# Patient Record
Sex: Female | Born: 1950 | Race: White | Hispanic: No | Marital: Married | Smoking: Current every day smoker
Health system: Southern US, Community
[De-identification: ages and names within clinical notes are randomized; demographics above are authoritative.]

## PROBLEM LIST (undated history)

## (undated) ENCOUNTER — Emergency Department (HOSPITAL_BASED_OUTPATIENT_CLINIC_OR_DEPARTMENT_OTHER): Admission: EM | Payer: Medicare Other | Source: Home / Self Care

## (undated) DIAGNOSIS — M069 Rheumatoid arthritis, unspecified: Secondary | ICD-10-CM

## (undated) DIAGNOSIS — M542 Cervicalgia: Secondary | ICD-10-CM

## (undated) DIAGNOSIS — M1990 Unspecified osteoarthritis, unspecified site: Secondary | ICD-10-CM

## (undated) DIAGNOSIS — M51369 Other intervertebral disc degeneration, lumbar region without mention of lumbar back pain or lower extremity pain: Secondary | ICD-10-CM

## (undated) DIAGNOSIS — J449 Chronic obstructive pulmonary disease, unspecified: Secondary | ICD-10-CM

## (undated) DIAGNOSIS — E785 Hyperlipidemia, unspecified: Secondary | ICD-10-CM

## (undated) DIAGNOSIS — G8929 Other chronic pain: Secondary | ICD-10-CM

## (undated) DIAGNOSIS — E079 Disorder of thyroid, unspecified: Secondary | ICD-10-CM

## (undated) DIAGNOSIS — I2510 Atherosclerotic heart disease of native coronary artery without angina pectoris: Secondary | ICD-10-CM

## (undated) DIAGNOSIS — F1110 Opioid abuse, uncomplicated: Secondary | ICD-10-CM

## (undated) DIAGNOSIS — M5136 Other intervertebral disc degeneration, lumbar region: Secondary | ICD-10-CM

## (undated) DIAGNOSIS — K760 Fatty (change of) liver, not elsewhere classified: Secondary | ICD-10-CM

## (undated) HISTORY — DX: Other chronic pain: G89.29

## (undated) HISTORY — DX: Atherosclerotic heart disease of native coronary artery without angina pectoris: I25.10

## (undated) HISTORY — DX: Other intervertebral disc degeneration, lumbar region without mention of lumbar back pain or lower extremity pain: M51.369

## (undated) HISTORY — DX: Cervicalgia: M54.2

## (undated) HISTORY — DX: Disorder of thyroid, unspecified: E07.9

## (undated) HISTORY — DX: Hyperlipidemia, unspecified: E78.5

## (undated) HISTORY — DX: Opioid abuse, uncomplicated: F11.10

## (undated) HISTORY — DX: Chronic obstructive pulmonary disease, unspecified: J44.9

## (undated) HISTORY — DX: Rheumatoid arthritis, unspecified: M06.9

## (undated) HISTORY — DX: Unspecified osteoarthritis, unspecified site: M19.90

## (undated) HISTORY — PX: BREAST BIOPSY: SHX20

## (undated) HISTORY — PX: BREAST SURGERY: SHX581

## (undated) HISTORY — DX: Fatty (change of) liver, not elsewhere classified: K76.0

## (undated) HISTORY — PX: CHOLECYSTECTOMY: SHX55

## (undated) HISTORY — PX: BREAST EXCISIONAL BIOPSY: SUR124

## (undated) HISTORY — DX: Other intervertebral disc degeneration, lumbar region: M51.36

---

## 2007-03-17 ENCOUNTER — Encounter: Payer: Self-pay | Admitting: Family Medicine

## 2007-03-17 LAB — CONVERTED CEMR LAB
ALT: 278 units/L
AST: 179 units/L
Alkaline Phosphatase: 185 units/L
BUN: 5 mg/dL
CO2: 29 meq/L
Chloride: 104 meq/L
Cholesterol: 185 mg/dL
Creatinine, Ser: 0.7 mg/dL
Glucose, Bld: 142 mg/dL
HCT: 43.2 %
HDL: 55 mg/dL
Hemoglobin: 14.3 g/dL
Hgb A1c MFr Bld: 6 %
LDL Cholesterol: 87 mg/dL
Platelets: 262 10*3/uL
Potassium: 4.4 meq/L
Sodium: 143 meq/L
Total Bilirubin: 0.3 mg/dL
Triglycerides: 232 mg/dL
WBC, blood: 8.2 10*3/uL

## 2007-06-05 ENCOUNTER — Ambulatory Visit: Payer: Self-pay | Admitting: Family Medicine

## 2007-06-05 DIAGNOSIS — G894 Chronic pain syndrome: Secondary | ICD-10-CM | POA: Insufficient documentation

## 2007-06-05 DIAGNOSIS — E119 Type 2 diabetes mellitus without complications: Secondary | ICD-10-CM | POA: Insufficient documentation

## 2007-06-05 LAB — CONVERTED CEMR LAB: Blood Glucose, Fasting: 98 mg/dL

## 2007-06-06 ENCOUNTER — Encounter: Payer: Self-pay | Admitting: Family Medicine

## 2007-06-08 ENCOUNTER — Ambulatory Visit: Payer: Self-pay | Admitting: Family Medicine

## 2007-06-12 ENCOUNTER — Telehealth: Payer: Self-pay | Admitting: Family Medicine

## 2007-06-13 ENCOUNTER — Encounter: Payer: Self-pay | Admitting: Family Medicine

## 2007-06-13 ENCOUNTER — Telehealth: Payer: Self-pay | Admitting: Family Medicine

## 2007-06-13 LAB — CONVERTED CEMR LAB
Basophils Absolute: 0 10*3/uL (ref 0.0–0.1)
Basophils Relative: 0 % (ref 0–1)
Lymphocytes Relative: 42 % (ref 12–46)
MCHC: 34.9 g/dL (ref 30.0–36.0)
Monocytes Absolute: 0.8 10*3/uL (ref 0.1–1.0)
Neutro Abs: 5 10*3/uL (ref 1.7–7.7)
Neutrophils Relative %: 50 % (ref 43–77)
Platelets: 281 10*3/uL (ref 150–400)
RDW: 12.2 % (ref 11.5–15.5)

## 2007-06-27 ENCOUNTER — Encounter: Payer: Self-pay | Admitting: Family Medicine

## 2007-06-28 LAB — CONVERTED CEMR LAB: TSH: 0.376 microintl units/mL (ref 0.350–5.50)

## 2007-07-05 ENCOUNTER — Encounter: Payer: Self-pay | Admitting: Family Medicine

## 2007-07-14 ENCOUNTER — Ambulatory Visit: Payer: Self-pay | Admitting: Family Medicine

## 2007-07-14 ENCOUNTER — Encounter: Admission: RE | Admit: 2007-07-14 | Discharge: 2007-07-14 | Payer: Self-pay | Admitting: Family Medicine

## 2007-07-17 ENCOUNTER — Telehealth: Payer: Self-pay | Admitting: Family Medicine

## 2007-07-18 ENCOUNTER — Encounter: Payer: Self-pay | Admitting: Family Medicine

## 2007-07-20 ENCOUNTER — Encounter: Payer: Self-pay | Admitting: Family Medicine

## 2007-08-01 ENCOUNTER — Encounter: Payer: Self-pay | Admitting: Family Medicine

## 2007-08-25 ENCOUNTER — Ambulatory Visit: Payer: Self-pay | Admitting: Family Medicine

## 2007-08-28 ENCOUNTER — Telehealth: Payer: Self-pay | Admitting: Family Medicine

## 2007-08-31 ENCOUNTER — Encounter: Payer: Self-pay | Admitting: Family Medicine

## 2007-09-19 ENCOUNTER — Telehealth: Payer: Self-pay | Admitting: Family Medicine

## 2007-09-19 ENCOUNTER — Encounter: Payer: Self-pay | Admitting: Family Medicine

## 2007-09-25 ENCOUNTER — Ambulatory Visit: Payer: Self-pay | Admitting: Family Medicine

## 2007-09-25 LAB — CONVERTED CEMR LAB

## 2007-09-28 ENCOUNTER — Encounter: Payer: Self-pay | Admitting: Family Medicine

## 2007-09-28 ENCOUNTER — Telehealth: Payer: Self-pay | Admitting: Family Medicine

## 2007-09-28 DIAGNOSIS — F330 Major depressive disorder, recurrent, mild: Secondary | ICD-10-CM | POA: Insufficient documentation

## 2007-09-28 DIAGNOSIS — R945 Abnormal results of liver function studies: Secondary | ICD-10-CM

## 2007-09-28 DIAGNOSIS — R7989 Other specified abnormal findings of blood chemistry: Secondary | ICD-10-CM | POA: Insufficient documentation

## 2007-09-28 DIAGNOSIS — E782 Mixed hyperlipidemia: Secondary | ICD-10-CM | POA: Insufficient documentation

## 2007-09-28 DIAGNOSIS — K7689 Other specified diseases of liver: Secondary | ICD-10-CM | POA: Insufficient documentation

## 2007-09-29 ENCOUNTER — Encounter: Payer: Self-pay | Admitting: Family Medicine

## 2007-09-29 ENCOUNTER — Telehealth: Payer: Self-pay | Admitting: Family Medicine

## 2007-10-06 ENCOUNTER — Telehealth: Payer: Self-pay | Admitting: Family Medicine

## 2007-10-17 ENCOUNTER — Encounter: Payer: Self-pay | Admitting: Family Medicine

## 2007-10-17 ENCOUNTER — Other Ambulatory Visit: Admission: RE | Admit: 2007-10-17 | Discharge: 2007-10-17 | Payer: Self-pay | Admitting: Family Medicine

## 2007-10-17 ENCOUNTER — Ambulatory Visit: Payer: Self-pay | Admitting: Family Medicine

## 2007-10-17 DIAGNOSIS — M810 Age-related osteoporosis without current pathological fracture: Secondary | ICD-10-CM | POA: Insufficient documentation

## 2007-10-18 LAB — CONVERTED CEMR LAB
ALT: 18 units/L (ref 0–35)
AST: 22 units/L (ref 0–37)

## 2007-10-25 ENCOUNTER — Ambulatory Visit (HOSPITAL_COMMUNITY): Admission: RE | Admit: 2007-10-25 | Discharge: 2007-10-25 | Payer: Self-pay | Admitting: Orthopedic Surgery

## 2007-10-26 ENCOUNTER — Telehealth: Payer: Self-pay | Admitting: Family Medicine

## 2007-11-06 ENCOUNTER — Encounter: Payer: Self-pay | Admitting: Family Medicine

## 2007-11-23 ENCOUNTER — Encounter: Admission: RE | Admit: 2007-11-23 | Discharge: 2007-11-23 | Payer: Self-pay | Admitting: Family Medicine

## 2007-11-23 ENCOUNTER — Encounter: Payer: Self-pay | Admitting: Family Medicine

## 2007-11-27 ENCOUNTER — Ambulatory Visit: Payer: Self-pay | Admitting: Family Medicine

## 2007-12-20 ENCOUNTER — Telehealth: Payer: Self-pay | Admitting: Family Medicine

## 2007-12-26 ENCOUNTER — Ambulatory Visit: Payer: Self-pay | Admitting: Family Medicine

## 2007-12-28 LAB — CONVERTED CEMR LAB
Hemoglobin: 13.7 g/dL (ref 12.0–15.0)
Lymphocytes Relative: 28 % (ref 12–46)
Lymphs Abs: 2.3 10*3/uL (ref 0.7–4.0)
Monocytes Absolute: 0.7 10*3/uL (ref 0.1–1.0)
Monocytes Relative: 8 % (ref 3–12)
Neutro Abs: 4.8 10*3/uL (ref 1.7–7.7)
Neutrophils Relative %: 60 % (ref 43–77)
RBC: 4.45 M/uL (ref 3.87–5.11)
WBC: 8 10*3/uL (ref 4.0–10.5)

## 2008-01-02 ENCOUNTER — Telehealth: Payer: Self-pay | Admitting: Family Medicine

## 2008-03-14 ENCOUNTER — Ambulatory Visit: Payer: Self-pay | Admitting: Family Medicine

## 2008-03-14 ENCOUNTER — Telehealth: Payer: Self-pay | Admitting: Family Medicine

## 2008-03-15 ENCOUNTER — Telehealth: Payer: Self-pay | Admitting: Family Medicine

## 2008-03-15 ENCOUNTER — Encounter: Payer: Self-pay | Admitting: Family Medicine

## 2008-03-18 ENCOUNTER — Encounter: Payer: Self-pay | Admitting: Family Medicine

## 2008-03-19 ENCOUNTER — Encounter: Payer: Self-pay | Admitting: Family Medicine

## 2008-03-19 LAB — CONVERTED CEMR LAB
Albumin: 4.7 g/dL (ref 3.5–5.2)
Alkaline Phosphatase: 61 units/L (ref 39–117)
CO2: 25 meq/L (ref 19–32)
Calcium: 9.2 mg/dL (ref 8.4–10.5)
Chloride: 104 meq/L (ref 96–112)
Cholesterol: 229 mg/dL — ABNORMAL HIGH (ref 0–200)
Glucose, Bld: 104 mg/dL — ABNORMAL HIGH (ref 70–99)
LDL Cholesterol: 119 mg/dL — ABNORMAL HIGH (ref 0–99)
Potassium: 4.7 meq/L (ref 3.5–5.3)
Sodium: 140 meq/L (ref 135–145)
Total Protein: 7.3 g/dL (ref 6.0–8.3)
Triglycerides: 242 mg/dL — ABNORMAL HIGH (ref <150)

## 2008-04-01 ENCOUNTER — Ambulatory Visit: Payer: Self-pay | Admitting: Family Medicine

## 2008-04-01 DIAGNOSIS — M719 Bursopathy, unspecified: Secondary | ICD-10-CM

## 2008-04-01 DIAGNOSIS — M67919 Unspecified disorder of synovium and tendon, unspecified shoulder: Secondary | ICD-10-CM | POA: Insufficient documentation

## 2008-04-08 ENCOUNTER — Telehealth: Payer: Self-pay | Admitting: Family Medicine

## 2008-04-09 ENCOUNTER — Encounter: Payer: Self-pay | Admitting: Family Medicine

## 2008-04-09 ENCOUNTER — Encounter: Admission: RE | Admit: 2008-04-09 | Discharge: 2008-04-09 | Payer: Self-pay | Admitting: Family Medicine

## 2008-04-19 ENCOUNTER — Encounter: Payer: Self-pay | Admitting: Family Medicine

## 2008-04-22 ENCOUNTER — Ambulatory Visit: Payer: Self-pay | Admitting: Family Medicine

## 2008-04-22 DIAGNOSIS — K649 Unspecified hemorrhoids: Secondary | ICD-10-CM | POA: Insufficient documentation

## 2008-05-06 ENCOUNTER — Telehealth: Payer: Self-pay | Admitting: Family Medicine

## 2008-05-13 ENCOUNTER — Encounter: Payer: Self-pay | Admitting: Family Medicine

## 2008-05-31 HISTORY — PX: TOTAL KNEE ARTHROPLASTY: SHX125

## 2008-06-26 ENCOUNTER — Telehealth: Payer: Self-pay | Admitting: Family Medicine

## 2008-07-03 ENCOUNTER — Ambulatory Visit: Payer: Self-pay | Admitting: Family Medicine

## 2008-07-09 ENCOUNTER — Telehealth: Payer: Self-pay | Admitting: Family Medicine

## 2008-07-13 ENCOUNTER — Encounter: Payer: Self-pay | Admitting: Family Medicine

## 2008-07-31 ENCOUNTER — Ambulatory Visit: Payer: Self-pay | Admitting: Family Medicine

## 2008-07-31 DIAGNOSIS — M12819 Other specific arthropathies, not elsewhere classified, unspecified shoulder: Secondary | ICD-10-CM | POA: Insufficient documentation

## 2008-07-31 DIAGNOSIS — M4802 Spinal stenosis, cervical region: Secondary | ICD-10-CM | POA: Insufficient documentation

## 2008-07-31 LAB — CONVERTED CEMR LAB: Hgb A1c MFr Bld: 5.7 %

## 2008-08-29 ENCOUNTER — Telehealth: Payer: Self-pay | Admitting: Family Medicine

## 2008-09-27 ENCOUNTER — Telehealth: Payer: Self-pay | Admitting: Family Medicine

## 2008-10-10 ENCOUNTER — Telehealth: Payer: Self-pay | Admitting: Family Medicine

## 2008-10-11 ENCOUNTER — Encounter: Payer: Self-pay | Admitting: Family Medicine

## 2008-10-11 LAB — CONVERTED CEMR LAB
BUN: 19 mg/dL (ref 6–23)
Creatinine, Ser: 1.3 mg/dL — ABNORMAL HIGH (ref 0.40–1.20)

## 2008-10-12 ENCOUNTER — Encounter: Admission: RE | Admit: 2008-10-12 | Discharge: 2008-10-12 | Payer: Self-pay | Admitting: Sports Medicine

## 2008-10-23 ENCOUNTER — Telehealth: Payer: Self-pay | Admitting: Family Medicine

## 2008-10-23 DIAGNOSIS — N189 Chronic kidney disease, unspecified: Secondary | ICD-10-CM | POA: Insufficient documentation

## 2008-10-31 ENCOUNTER — Encounter: Payer: Self-pay | Admitting: Family Medicine

## 2008-11-01 LAB — CONVERTED CEMR LAB: Creatinine, Ser: 0.83 mg/dL (ref 0.40–1.20)

## 2008-11-21 ENCOUNTER — Telehealth (INDEPENDENT_AMBULATORY_CARE_PROVIDER_SITE_OTHER): Payer: Self-pay | Admitting: *Deleted

## 2008-11-28 ENCOUNTER — Telehealth: Payer: Self-pay | Admitting: Family Medicine

## 2008-12-20 ENCOUNTER — Ambulatory Visit: Payer: Self-pay | Admitting: Family Medicine

## 2008-12-20 LAB — CONVERTED CEMR LAB: Albumin/Creatinine Ratio, Urine, POC: <30

## 2008-12-21 ENCOUNTER — Encounter: Payer: Self-pay | Admitting: Family Medicine

## 2008-12-24 LAB — CONVERTED CEMR LAB
Albumin: 4.4 g/dL (ref 3.5–5.2)
Basophils Relative: 1 % (ref 0–1)
CO2: 22 meq/L (ref 19–32)
Glucose, Bld: 131 mg/dL — ABNORMAL HIGH (ref 70–99)
Hemoglobin: 15.1 g/dL — ABNORMAL HIGH (ref 12.0–15.0)
Lymphocytes Relative: 49 % — ABNORMAL HIGH (ref 12–46)
MCHC: 33.9 g/dL (ref 30.0–36.0)
Monocytes Absolute: 0.6 10*3/uL (ref 0.1–1.0)
Monocytes Relative: 7 % (ref 3–12)
Neutro Abs: 3.4 10*3/uL (ref 1.7–7.7)
Potassium: 4.5 meq/L (ref 3.5–5.3)
RBC: 4.52 M/uL (ref 3.87–5.11)
Sodium: 138 meq/L (ref 135–145)
TSH: 0.278 microintl units/mL — ABNORMAL LOW (ref 0.350–4.500)
Tissue Transglutaminase Ab, IgA: 0.5 units (ref <7)
Total Protein: 7 g/dL (ref 6.0–8.3)

## 2008-12-30 ENCOUNTER — Telehealth: Payer: Self-pay | Admitting: Family Medicine

## 2008-12-30 ENCOUNTER — Ambulatory Visit: Payer: Self-pay | Admitting: Family Medicine

## 2009-01-06 ENCOUNTER — Telehealth: Payer: Self-pay | Admitting: Family Medicine

## 2009-01-07 ENCOUNTER — Encounter: Admission: RE | Admit: 2009-01-07 | Discharge: 2009-01-07 | Payer: Self-pay | Admitting: Family Medicine

## 2009-01-09 ENCOUNTER — Encounter: Payer: Self-pay | Admitting: Family Medicine

## 2009-02-06 ENCOUNTER — Ambulatory Visit: Payer: Self-pay | Admitting: Family Medicine

## 2009-02-12 ENCOUNTER — Inpatient Hospital Stay (HOSPITAL_COMMUNITY): Admission: RE | Admit: 2009-02-12 | Discharge: 2009-02-15 | Payer: Self-pay | Admitting: Orthopedic Surgery

## 2009-02-27 ENCOUNTER — Telehealth: Payer: Self-pay | Admitting: Family Medicine

## 2009-03-05 ENCOUNTER — Telehealth: Payer: Self-pay | Admitting: Family Medicine

## 2009-03-07 ENCOUNTER — Encounter: Payer: Self-pay | Admitting: Family Medicine

## 2009-03-07 LAB — CONVERTED CEMR LAB: Prothrombin Time: 17.6 s

## 2009-03-10 LAB — CONVERTED CEMR LAB: TSH: 12.905 microintl units/mL — ABNORMAL HIGH (ref 0.350–4.500)

## 2009-04-21 ENCOUNTER — Encounter: Admission: RE | Admit: 2009-04-21 | Discharge: 2009-04-21 | Payer: Self-pay | Admitting: Family Medicine

## 2009-04-21 ENCOUNTER — Ambulatory Visit: Payer: Self-pay | Admitting: Family Medicine

## 2009-05-01 ENCOUNTER — Telehealth: Payer: Self-pay | Admitting: Family Medicine

## 2009-05-14 ENCOUNTER — Telehealth (INDEPENDENT_AMBULATORY_CARE_PROVIDER_SITE_OTHER): Payer: Self-pay | Admitting: *Deleted

## 2009-05-21 ENCOUNTER — Encounter: Payer: Self-pay | Admitting: Family Medicine

## 2009-05-28 ENCOUNTER — Telehealth: Payer: Self-pay | Admitting: Family Medicine

## 2009-06-23 ENCOUNTER — Encounter: Payer: Self-pay | Admitting: Family Medicine

## 2009-07-03 ENCOUNTER — Ambulatory Visit: Payer: Self-pay | Admitting: Family Medicine

## 2009-07-07 ENCOUNTER — Telehealth: Payer: Self-pay | Admitting: Family Medicine

## 2009-07-07 LAB — CONVERTED CEMR LAB
ALT: 15 units/L (ref 0–35)
Albumin: 4.5 g/dL (ref 3.5–5.2)
Alkaline Phosphatase: 75 units/L (ref 39–117)
CO2: 24 meq/L (ref 19–32)
Glucose, Bld: 94 mg/dL (ref 70–99)
LDL Cholesterol: 170 mg/dL — ABNORMAL HIGH (ref 0–99)
MCHC: 32.7 g/dL (ref 30.0–36.0)
Potassium: 4.6 meq/L (ref 3.5–5.3)
RBC: 4.96 M/uL (ref 3.87–5.11)
Sodium: 140 meq/L (ref 135–145)
Total Protein: 7.7 g/dL (ref 6.0–8.3)
Triglycerides: 224 mg/dL — ABNORMAL HIGH (ref <150)
WBC: 12.6 10*3/uL — ABNORMAL HIGH (ref 4.0–10.5)

## 2009-07-14 ENCOUNTER — Telehealth: Payer: Self-pay | Admitting: Family Medicine

## 2009-08-05 ENCOUNTER — Ambulatory Visit: Payer: Self-pay | Admitting: Family Medicine

## 2009-08-21 ENCOUNTER — Telehealth (INDEPENDENT_AMBULATORY_CARE_PROVIDER_SITE_OTHER): Payer: Self-pay | Admitting: *Deleted

## 2009-09-10 ENCOUNTER — Ambulatory Visit: Payer: Self-pay | Admitting: Family Medicine

## 2009-10-01 ENCOUNTER — Telehealth: Payer: Self-pay | Admitting: Family Medicine

## 2009-10-15 ENCOUNTER — Encounter: Payer: Self-pay | Admitting: Family Medicine

## 2009-10-23 ENCOUNTER — Ambulatory Visit: Payer: Self-pay | Admitting: Family Medicine

## 2009-10-29 ENCOUNTER — Encounter: Admission: RE | Admit: 2009-10-29 | Discharge: 2009-10-29 | Payer: Self-pay | Admitting: Family Medicine

## 2009-10-29 ENCOUNTER — Ambulatory Visit: Payer: Self-pay | Admitting: Family Medicine

## 2009-11-13 ENCOUNTER — Telehealth: Payer: Self-pay | Admitting: Family Medicine

## 2009-11-13 ENCOUNTER — Encounter: Payer: Self-pay | Admitting: Family Medicine

## 2009-11-17 ENCOUNTER — Telehealth: Payer: Self-pay | Admitting: Family Medicine

## 2009-11-26 ENCOUNTER — Telehealth (INDEPENDENT_AMBULATORY_CARE_PROVIDER_SITE_OTHER): Payer: Self-pay | Admitting: *Deleted

## 2009-12-04 ENCOUNTER — Ambulatory Visit: Payer: Self-pay | Admitting: Family Medicine

## 2009-12-04 LAB — CONVERTED CEMR LAB: Hgb A1c MFr Bld: 6.1 %

## 2009-12-05 ENCOUNTER — Telehealth: Payer: Self-pay | Admitting: Family Medicine

## 2009-12-05 LAB — CONVERTED CEMR LAB
AST: 129 units/L — ABNORMAL HIGH (ref 0–37)
Bilirubin, Direct: 0.1 mg/dL (ref 0.0–0.3)
Direct LDL: 107 mg/dL — ABNORMAL HIGH
GGT: 64 units/L — ABNORMAL HIGH (ref 7–51)
TSH: 0.608 microintl units/mL (ref 0.350–4.500)
Total Bilirubin: 0.3 mg/dL (ref 0.3–1.2)

## 2009-12-09 ENCOUNTER — Telehealth (INDEPENDENT_AMBULATORY_CARE_PROVIDER_SITE_OTHER): Payer: Self-pay | Admitting: *Deleted

## 2009-12-24 ENCOUNTER — Ambulatory Visit: Payer: Self-pay | Admitting: Family Medicine

## 2009-12-24 DIAGNOSIS — K700 Alcoholic fatty liver: Secondary | ICD-10-CM | POA: Insufficient documentation

## 2009-12-24 DIAGNOSIS — T7840XA Allergy, unspecified, initial encounter: Secondary | ICD-10-CM | POA: Insufficient documentation

## 2009-12-30 ENCOUNTER — Telehealth: Payer: Self-pay | Admitting: Family Medicine

## 2010-01-09 ENCOUNTER — Telehealth: Payer: Self-pay | Admitting: Family Medicine

## 2010-01-19 ENCOUNTER — Ambulatory Visit: Payer: Self-pay | Admitting: Family Medicine

## 2010-01-21 ENCOUNTER — Encounter: Payer: Self-pay | Admitting: Family Medicine

## 2010-01-21 ENCOUNTER — Telehealth (INDEPENDENT_AMBULATORY_CARE_PROVIDER_SITE_OTHER): Payer: Self-pay | Admitting: *Deleted

## 2010-01-21 LAB — CONVERTED CEMR LAB
ALT: 14 units/L (ref 0–35)
Alkaline Phosphatase: 82 units/L (ref 39–117)
Bilirubin, Direct: 0.1 mg/dL (ref 0.0–0.3)
CO2: 26 meq/L (ref 19–32)
Chloride: 101 meq/L (ref 96–112)
INR: 0.92 (ref <1.50)
Indirect Bilirubin: 0.2 mg/dL (ref 0.0–0.9)
Lymphocytes Relative: 34 % (ref 12–46)
Lymphs Abs: 3.4 10*3/uL (ref 0.7–4.0)
MCV: 96.1 fL (ref 78.0–100.0)
Monocytes Relative: 8 % (ref 3–12)
Neutro Abs: 5.6 10*3/uL (ref 1.7–7.7)
Neutrophils Relative %: 55 % (ref 43–77)
Potassium: 4.4 meq/L (ref 3.5–5.3)
Prothrombin Time: 12.6 s (ref 11.6–15.2)
RBC: 4.1 M/uL (ref 3.87–5.11)
Sodium: 138 meq/L (ref 135–145)
Total Protein: 7.2 g/dL (ref 6.0–8.3)
WBC: 10.1 10*3/uL (ref 4.0–10.5)

## 2010-01-22 ENCOUNTER — Telehealth: Payer: Self-pay | Admitting: Family Medicine

## 2010-01-29 ENCOUNTER — Telehealth: Payer: Self-pay | Admitting: Family Medicine

## 2010-02-10 ENCOUNTER — Telehealth (INDEPENDENT_AMBULATORY_CARE_PROVIDER_SITE_OTHER): Payer: Self-pay | Admitting: *Deleted

## 2010-03-18 ENCOUNTER — Telehealth: Payer: Self-pay | Admitting: Family Medicine

## 2010-04-04 ENCOUNTER — Encounter: Admission: RE | Admit: 2010-04-04 | Discharge: 2010-04-04 | Payer: Self-pay | Admitting: Orthopedic Surgery

## 2010-04-10 ENCOUNTER — Ambulatory Visit: Payer: Self-pay | Admitting: Family Medicine

## 2010-04-10 DIAGNOSIS — J0100 Acute maxillary sinusitis, unspecified: Secondary | ICD-10-CM | POA: Insufficient documentation

## 2010-05-08 ENCOUNTER — Ambulatory Visit: Payer: Self-pay | Admitting: Family Medicine

## 2010-05-13 ENCOUNTER — Ambulatory Visit: Payer: Self-pay | Admitting: Family Medicine

## 2010-05-13 DIAGNOSIS — R030 Elevated blood-pressure reading, without diagnosis of hypertension: Secondary | ICD-10-CM | POA: Insufficient documentation

## 2010-05-18 ENCOUNTER — Encounter
Admission: RE | Admit: 2010-05-18 | Discharge: 2010-05-18 | Payer: Self-pay | Source: Home / Self Care | Attending: Orthopedic Surgery | Admitting: Orthopedic Surgery

## 2010-05-19 ENCOUNTER — Telehealth (INDEPENDENT_AMBULATORY_CARE_PROVIDER_SITE_OTHER): Payer: Self-pay | Admitting: *Deleted

## 2010-05-27 ENCOUNTER — Encounter
Admission: RE | Admit: 2010-05-27 | Discharge: 2010-05-27 | Payer: Self-pay | Source: Home / Self Care | Attending: Family Medicine | Admitting: Family Medicine

## 2010-05-31 HISTORY — PX: LUMBAR LAMINECTOMY: SHX95

## 2010-06-02 ENCOUNTER — Telehealth: Payer: Self-pay | Admitting: Family Medicine

## 2010-06-08 ENCOUNTER — Telehealth: Payer: Self-pay | Admitting: Family Medicine

## 2010-06-10 ENCOUNTER — Ambulatory Visit
Admission: RE | Admit: 2010-06-10 | Discharge: 2010-06-10 | Payer: Self-pay | Source: Home / Self Care | Attending: Family Medicine | Admitting: Family Medicine

## 2010-06-17 ENCOUNTER — Inpatient Hospital Stay (HOSPITAL_COMMUNITY)
Admission: AD | Admit: 2010-06-17 | Discharge: 2010-06-19 | Payer: Self-pay | Source: Home / Self Care | Attending: Orthopedic Surgery | Admitting: Orthopedic Surgery

## 2010-06-17 ENCOUNTER — Encounter (INDEPENDENT_AMBULATORY_CARE_PROVIDER_SITE_OTHER): Payer: Self-pay | Admitting: Orthopedic Surgery

## 2010-06-17 LAB — APTT: aPTT: 35 seconds (ref 24–37)

## 2010-06-17 LAB — SURGICAL PCR SCREEN
MRSA, PCR: NEGATIVE
Staphylococcus aureus: NEGATIVE

## 2010-06-17 LAB — URINALYSIS, ROUTINE W REFLEX MICROSCOPIC
Bilirubin Urine: NEGATIVE
Hgb urine dipstick: NEGATIVE
Ketones, ur: NEGATIVE mg/dL
Nitrite: NEGATIVE
Protein, ur: NEGATIVE mg/dL
Specific Gravity, Urine: 1.014 (ref 1.005–1.030)
Urine Glucose, Fasting: NEGATIVE mg/dL
Urobilinogen, UA: 0.2 mg/dL (ref 0.0–1.0)
pH: 7 (ref 5.0–8.0)

## 2010-06-17 LAB — PROTIME-INR
INR: 0.96 (ref 0.00–1.49)
Prothrombin Time: 13 seconds (ref 11.6–15.2)

## 2010-06-17 LAB — COMPREHENSIVE METABOLIC PANEL
ALT: 19 U/L (ref 0–35)
AST: 22 U/L (ref 0–37)
Albumin: 4 g/dL (ref 3.5–5.2)
Alkaline Phosphatase: 73 U/L (ref 39–117)
BUN: 9 mg/dL (ref 6–23)
CO2: 30 mEq/L (ref 19–32)
Calcium: 9.5 mg/dL (ref 8.4–10.5)
Chloride: 103 mEq/L (ref 96–112)
Creatinine, Ser: 0.8 mg/dL (ref 0.4–1.2)
GFR calc Af Amer: >60 mL/min (ref >60)
GFR calc non Af Amer: >60 mL/min (ref >60)
Glucose, Bld: 121 mg/dL — ABNORMAL HIGH (ref 70–99)
Potassium: 4.2 mEq/L (ref 3.5–5.1)
Sodium: 140 mEq/L (ref 135–145)
Total Bilirubin: 0.5 mg/dL (ref 0.3–1.2)
Total Protein: 7.5 g/dL (ref 6.0–8.3)

## 2010-06-17 LAB — DIFFERENTIAL
Basophils Absolute: 0.1 10*3/uL (ref 0.0–0.1)
Basophils Relative: 1 % (ref 0–1)
Eosinophils Absolute: 0.4 10*3/uL (ref 0.0–0.7)
Eosinophils Relative: 3 % (ref 0–5)
Lymphocytes Relative: 30 % (ref 12–46)
Lymphs Abs: 3.3 10*3/uL (ref 0.7–4.0)
Monocytes Absolute: 0.9 10*3/uL (ref 0.1–1.0)
Monocytes Relative: 8 % (ref 3–12)
Neutro Abs: 6.5 10*3/uL (ref 1.7–7.7)
Neutrophils Relative %: 59 % (ref 43–77)

## 2010-06-17 LAB — TYPE AND SCREEN
ABO/RH(D): O NEG
Antibody Screen: NEGATIVE

## 2010-06-17 LAB — CBC
HCT: 39.7 % (ref 36.0–46.0)
Hemoglobin: 13.3 g/dL (ref 12.0–15.0)
MCH: 30.3 pg (ref 26.0–34.0)
MCHC: 33.5 g/dL (ref 30.0–36.0)
MCV: 90.4 fL (ref 78.0–100.0)
Platelets: 298 10*3/uL (ref 150–400)
RBC: 4.39 MIL/uL (ref 3.87–5.11)
RDW: 12.4 % (ref 11.5–15.5)
WBC: 11 10*3/uL — ABNORMAL HIGH (ref 4.0–10.5)

## 2010-06-22 ENCOUNTER — Encounter: Payer: Self-pay | Admitting: Orthopedic Surgery

## 2010-06-22 LAB — HEMOGLOBIN AND HEMATOCRIT, BLOOD
HCT: 36.7 % (ref 36.0–46.0)
HCT: 32.4 % — ABNORMAL LOW (ref 36.0–46.0)
Hemoglobin: 11.9 g/dL — ABNORMAL LOW (ref 12.0–15.0)
Hemoglobin: 10.8 g/dL — ABNORMAL LOW (ref 12.0–15.0)

## 2010-06-22 LAB — GLUCOSE, CAPILLARY
Glucose-Capillary: 108 mg/dL — ABNORMAL HIGH (ref 70–99)
Glucose-Capillary: 122 mg/dL — ABNORMAL HIGH (ref 70–99)
Glucose-Capillary: 139 mg/dL — ABNORMAL HIGH (ref 70–99)
Glucose-Capillary: 120 mg/dL — ABNORMAL HIGH (ref 70–99)
Glucose-Capillary: 108 mg/dL — ABNORMAL HIGH (ref 70–99)

## 2010-06-22 NOTE — Op Note (Signed)
NAMEILEE, RANDLEMAN                 ACCOUNT NO.:  [id number]  MEDICAL RECORD NO.:  [id number]          PATIENT TYPE:  AMB  LOCATION:  DAY                          FACILITY:  [facility]  PHYSICIAN:  Georges Lynch. Demecia Northway, M.D.DATE OF BIRTH:  [birth year]  DATE OF PROCEDURE:  06/17/2010 DATE OF DISCHARGE:                              OPERATIVE REPORT   SURGEON:  Georges Lynch. Darrelyn Hillock, M.D.  ASSISTANT:  Marlowe Kays, M.D.  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  The myelogram reported that the stenotic area was quite likely compressing the L3 root.  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  OPERATIONS: 1. Complete decompressive lumbar laminectomy L3-L4 for stenosis. 2. Microdiskectomy at L3-L4 on the left for herniated disk and recess     stenosis.  We did foraminotomies for the 3 and 4 roots as well.  PROCEDURE:  Under general anesthesia, routine orthopedic prep and drape in the lower back carried out.  She was given 1 gram of IV Ancef.  At this time, the appropriate time-out was carried out in the operating room.  Prior to that, I marked the appropriate side of her back, which was the left side.  After sterile prep and drape, these are carried out on the spinal frame.  Two needles are placed in the back for localization purposes and an x-ray was taken.  At this particular time, an incision is made over the L3-L4, L4-L5 space.  Bleeders identified and cauterized.  She had quite a bit of oozing, slow oozing of blood during the procedure.  We were able to maintain hemostasis.  The muscle was stripped from the lamina and spinous processes bilaterally at L3-L4, two Kocher clamps are placed on the spinous processes.  Another x-ray was taken to identify the L3 spinous process.  We then carried out a complete decompressive lumbar laminectomy at the L3-L4 level.  The microscope  was brought in.  Great care was taken to protect the underlying dura.  He had a marked recess stenosis on the left at L3-L4. We went up above and did a decompression for the foramina of the root above and below.  We decompressed L3 and L4.  We were able to easily pass a hockey-stick out the foramina when we were done.  We then went out far laterally because of the tightness of the canal, cauterized lateral recess veins, identified the disk and needle was placed in the disk space.  The disk space was quite narrow.  We then protected the dura with a D'Errico retractor and then made a cruciate incision in the posterior longitudinal ligament and did a diskectomy.  Note, the microscope was used.  After this was done, the root was nicely decompressed as well as the dura.  We were able to easily pass a hockey- stick out the foramina of the 4 root and the 3 root.  Thoroughly irrigated out the area.  We then injected 10 mL of Surgiflo around the operative site for hemostasis and then lightly applied some  thrombin- soaked Gelfoam, not over the dura, but around the surrounding soft tissue and bony structures.  I then closed the wound in layers usual fashion, except I left a small deep distal and proximal part of the wound open for drainage purposes.  Subcutaneous closed with Vicryl, skin is closed with metal staples.  A sterile Neosporin dressing is applied.  Patient left the operating room in satisfactory condition.  ESTIMATED BLOOD LOSS:  About 150 mL.  She did have 1 g of IV Ancef preop.          ______________________________ Georges Lynch. Darrelyn Hillock, M.D.     RAG/MEDQ  D:  06/17/2010  T:  06/17/2010  Job:  [id number]  cc:   Windy Fast A. Darrelyn Hillock, M.D. Fax: [phone]  Electronically Signed by Ranee Gosselin M.D. on 06/22/2010 11:51:37 AM

## 2010-06-23 ENCOUNTER — Telehealth: Payer: Self-pay | Admitting: Family Medicine

## 2010-06-30 NOTE — Progress Notes (Signed)
Summary: ortho ?s  Phone Note Call from Patient   Caller: Patient Summary of Call: Pt wants to increase dose of omeprazole and would like to know if she can start prednisone for knee pain in hopes to get off pain meds. Please advise.  Initial call taken by: Payton Spark CMA,  July 07, 2009 4:06 PM  Follow-up for Phone Call        advise asking orthopedist about prednisone.  long term prednisone for a chronic condition like osteoarthrits can have negative consequenses.  Follow-up by: Seymour Bars DO,  July 07, 2009 4:25 PM    New/Updated Medications: OMEPRAZOLE 40 MG CPDR (OMEPRAZOLE) 1 tab by mouth daily, 30 min before breakfast Prescriptions: OMEPRAZOLE 40 MG CPDR (OMEPRAZOLE) 1 tab by mouth daily, 30 min before breakfast  #30 x 3   Entered and Authorized by:   Seymour Bars DO   Signed by:   Seymour Bars DO on 07/07/2009   Method used:   Electronically to        [facility] [phone]* (retail)       [address]       [place], [place]  [address]       Ph: [phone]       Fax: [phone]   RxID:   [id number]   Appended Document: ortho ?s LMOM informing Pt of the above as she requested  Appended Document: ortho ?s Noted above comment.  I have recently started [name] on an antidepressant.  Seymour Bars, D.O.

## 2010-06-30 NOTE — Progress Notes (Signed)
Summary: Reclast  Phone Note Call from Patient   Caller: Patient Summary of Call: Pt would like to have Reclast done. She would like to have this scheduled at [facility]. Please advise. Initial call taken by: Payton Spark CMA,  November 13, 2009 9:57 AM  Follow-up for Phone Call        Marcelino Duster, pls call [facility] and find out what she needs to have this done - a CMP within 8 wks?? Follow-up by: Seymour Bars DO,  November 17, 2009 8:04 AM  Additional Follow-up for Phone Call Additional follow up Details #1::        Spoke with dept at [facility] who does reclast and they said there is no time frame as long as been done within a year Additional Follow-up by: Kathlene November,  November 17, 2009 1:36 PM  New Problems: HYPERCALCEMIA (ICD-275.42)   Additional Follow-up for Phone Call Additional follow up Details #2::    needs repeat Calcium with PTH first. Then will set up Reclast IV infusion at short stay at [facility].  She is to hydrate well the day prior to Reclast infusion and take Tylenol the night before her infusion.   Follow-up by: Seymour Bars DO,  November 17, 2009 1:50 PM  New Problems: HYPERCALCEMIA (ICD-275.42) New/Updated Medications: RECLAST 5 MG/100ML SOLN (ZOLEDRONIC ACID) IV infusion over 15 minutes Prescriptions: RECLAST 5 MG/100ML SOLN (ZOLEDRONIC ACID) IV infusion over 15 minutes  #1 x 0   Entered and Authorized by:   Seymour Bars DO   Signed by:   Seymour Bars DO on 11/17/2009   Method used:   Print then Give to Patient   RxID:   [id number]   Appended Document: Reclast 11/17/2009 @ 1:52pm-LMOM for pt to call back and let know when wants to do lab work and I would fax the order to [employer]. KJ LPN

## 2010-06-30 NOTE — Progress Notes (Signed)
Summary: Flonase  Phone Note Call from Patient Call back at Home Phone [phone]   Caller: Patient Call For: Seymour Bars DO Summary of Call: Pt called and had sinus infection and using Claritin D and saline solution but would like to know if you would call in the Flonase for her to use Initial call taken by: Kathlene November,  November 17, 2009 9:51 AM    Prescriptions: Aleda Grana 50 MCG/ACT SUSP (FLUTICASONE PROPIONATE) 2 sprays/ nostril daily  #1 bottle x 2   Entered and Authorized by:   Seymour Bars DO   Signed by:   Seymour Bars DO on 11/17/2009   Method used:   Electronically to        [facility] [phone]* (retail)       [address]       [place], [place]  [address]       Ph: [phone]       Fax: [phone]   RxID:   [id number]

## 2010-06-30 NOTE — Assessment & Plan Note (Signed)
Summary: allergic reaction   Vital Signs:  Patient profile:   60 year old female Height:      64.5 inches Weight:      129 pounds BMI:     21.88 O2 Sat:      93 % on Room air Pulse rate:   90 / minute BP sitting:   124 / 76  (left arm) Cuff size:   regular  Vitals Entered By: Payton Spark CMA (December 24, 2009 11:41 AM)  O2 Flow:  Room air CC: Eyes red, swollen and itchy. Ichy rash on arms and legs x 5-6 days.  Vision Screening:Left eye w/o correction: 20 / 70 Right Eye w/o correction: 20 / 50 Both eyes w/o correction:  20/ 50        Vision Entered By: Payton Spark CMA (December 24, 2009 11:42 AM)   Primary Care Provider:  Seymour Bars DO  CC:  Eyes red and swollen and itchy. Ichy rash on arms and legs x 5-6 days.[name]  History of Present Illness: 60 yo WF presents for a rash on her arms, trunk, face and legs x 4 days.  It is a little itchy.  Her eyes have been itchy but no watering or discharge.  Denies any blurred vision but she is supposed to be wearing RX glasses which she broke.    She would like to quit drinking and quit smoking.  She is checking into getting a discount on Chantix and is aware that there is a black box warning re: SCD.  She drinks 3 beers/ day and says that she 'cannot quit'.  She used to drink more but notes that she does not drink to 'get drunk' and rarely binges any more.  She does have a distant hx of DUI.  She knows that it is bad to drink ETOH with elevated LFTs.  she is not quite ready for AA.  Allergies: 1)  ! Codeine 2)  ! * Ultracet  Past History:  Past Medical History: Reviewed history from 07/03/2009 and no changes required. 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 GB breast lump, benign knee R and L hand 2007 R knee TKR  2010  Family History: Reviewed history from 07/31/2008 and no changes required. Mother and father AMI in 19's mother: high chol, died of NHL father died, DM GM Hodgkin's Lympoma sister:  primary biliary cirrhosis father, mother both had RA  Social History: Reviewed history from 06/05/2007 and no changes required. 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 Has 2 sons - 12 & 30. Smokes 1 ppd x 43 yrs. 5 ETOH/ wk. Denies drug use. does not work out  Review of Systems General:  Complains of fatigue; denies fever, weakness, and weight loss. Eyes:  Complains of itching; denies blurring, discharge, eye irritation, and red eye. ENT:  Denies difficulty swallowing and sore throat. Resp:  Denies cough, shortness of breath, and wheezing.  Physical Exam  General:  alert, well-developed, well-nourished, and well-hydrated.   Head:  normocephalic and atraumatic.   Eyes:  R>L upper eyelid edema with slight injection.  Sclera w/o injection.  No watering or mucous discharge Nose:  no nasal discharge.   Mouth:  pharynx pink and moist.   Neck:  no masses.   Lungs:  Normal respiratory effort, chest expands symmetrically. Lungs  are clear to auscultation, no crackles or wheezes. Heart:  Normal rate and regular rhythm. S1 and S2 normal without gallop, murmur, click, rub or other extra sounds. Msk:  no joint swelling, no joint warmth, and no redness over joints.   Extremities:  no LE edema Skin:  maculopapular rash on both arms, chest, face, legs. Cervical Nodes:  No lymphadenopathy noted Psych:  good eye contact, not anxious appearing, and not depressed appearing.     Impression & Recommendations:  Problem # 1:  ALLERGIC REACTION (ICD-995.3)  Allergic dermatitis with eyelid edema and itchy eyes.   Unsure of what the etiology is.  Treat with Solumedrol 125 MG IM x 1 followed by 7 days of Prednisone 40 mg/ day. Use Benadyl at night as needed for itching.  Call if not improved by Monday.     Orders: Solumedrol up to 125mg  (W4132) Admin of Therapeutic Inj  intramuscular or subcutaneous (44010)  Problem # 2:  ALCOHOLIC FATTY LIVER (ICD-571.0) Counseled on ETOH dependence.  She is in the contemplative phase of change.  She does not seem a direct threat to herself or others but does realize that continued smoking and ETOh are both detrimental to her health.  Time spent face to face: 20 min.  Complete Medication List: 1)  Alprazolam 1 Mg Tabs (Alprazolam) .[name].. 1 tab by mouth two times a day as needed anxiety 2)  Gabapentin 600 Mg Tabs (Gabapentin) .[name].. 1 tab by mouth tid 3)  Metformin Hcl 850 Mg Tabs (Metformin hcl) .[name].. 1 tab by mouth bid 4)  Levothyroxine Sodium 125 Mcg Tabs (Levothyroxine sodium) .[name].. 1 tab by mouth daily 5)  Ventolin Hfa 108 (90 Base) Mcg/act Aers (Albuterol sulfate) .... 2 puffs q 4 hrs prn 6)  Oxycodone Hcl 10 Mg Tabs (Oxycodone hcl) .[name].. 1 tab by mouth q 6 hrs as needed severe pain 7)  Flonase 50 Mcg/act Susp (Fluticasone propionate) .... 2 sprays/ nostril daily 8)  Effexor Xr 150 Mg Xr24h-cap (Venlafaxine hcl) .[name].. 1 capsule by mouth once a day 9)  Omeprazole 40 Mg Cpdr (Omeprazole) .[name].. 1 tab by mouth daily, 30 min before breakfast 10)  Duoneb 0.5-2.5 (3) Mg/105ml Soln (Ipratropium-albuterol) .... 3 ml neb 4 x a day 11)  Symbicort 160-4.5 Mcg/act Aero (Budesonide-formoterol fumarate) .[name].. 1 puff bid 12)  Reclast 5 Mg/112ml Soln (Zoledronic acid) .... Iv infusion over 15 minutes 13)  Prednisone 20 Mg Tabs (Prednisone) .... 2 tabs by mouth qam x 7 days  Patient Instructions: 1)  Steroid shot today. 2)  Start on oral prednisone tomorrow - 2 tabs each AM x 7 days for allergic reaction. 3)  Be sure to get glasses for vision -- you failed your test today. 4)  Call if not improved in 1 wk. Prescriptions: PREDNISONE 20 MG TABS (PREDNISONE) 2 tabs by mouth qAM x 7 days  #14 x 0   Entered and Authorized by:   Seymour Bars DO   Signed by:   Seymour Bars DO on  12/24/2009   Method used:   Electronically to        [facility] [phone]* (retail)       [address] [place], [place]  [address]       Ph: [phone] or [phone]       Fax: [phone]   RxID:   [id number] PREDNISONE 20 MG TABS (PREDNISONE) 2 tabs by mouth qAM x 7 days  #14 x 0  Entered and Authorized by:   Seymour Bars DO   Signed by:   Seymour Bars DO on 12/24/2009   Method used:   Electronically to        [facility] [phone]* (retail)       [address] [place], [place]  [address]       Ph: [phone]       Fax: [phone]   RxID:   [id number]    Medication Administration  Injection # 1:    Medication: Solumedrol up to 125mg     Diagnosis: COPD (ICD-496)    Route: IM    Site: LUOQ gluteus    Exp Date: 07/2012    Lot #: [id number]    Patient tolerated injection without complications    Given by: Payton Spark CMA (December 24, 2009 1:10 PM)  Orders Added: 1)  Solumedrol up to 125mg  [J2930] 2)  Admin of Therapeutic Inj  intramuscular or subcutaneous [96372] 3)  Est. Patient Level IV [02725]

## 2010-06-30 NOTE — Assessment & Plan Note (Signed)
Summary: COPD exacerbation   Vital Signs:  Patient profile:   60 year old female Height:      64.5 inches Weight:      130 pounds BMI:     22.05 O2 Sat:      94 % on Room air Temp:     100.8 degrees F oral Pulse rate:   102 / minute BP sitting:   135 / 72  (left arm) Cuff size:   regular  Vitals Entered By: Payton Spark CMA (Oct 23, 2009 8:39 AM)  O2 Flow:  Room air CC: Bad cough, facial pressure, body aches x 1+ weeks.    Primary Care Provider:  Seymour Bars DO  CC:  Bad cough, facial pressure, and body aches x 1+ weeks. [name]  History of Present Illness: 60 yo WF presents for a cough x 10 days with a fever x 2 days.  She has a lot of head congestion and sinus pressure.  Her cough is productive of white phlegm.  Has a bad HA.  No ST.  Has some rhinorrhea.  Taking Mucinex D which helps some.  Not taking anything for the cough.  Chest wall hurts from cough.  Coughing a lot at night.  She has chest tightness and SOB.  Trying not to smoke.  She has COPD only rarely using a rescue inhaler.    Current Medications (verified): 1)  Alprazolam 1 Mg  Tabs (Alprazolam) .[name].. 1 Tab By Mouth Two Times A Day As Needed Anxiety 2)  Gabapentin 600 Mg Tabs (Gabapentin) .[name].. 1 Tab By Mouth Tid 3)  Metformin Hcl 850 Mg Tabs (Metformin Hcl) .[name].. 1 Tab By Mouth Bid 4)  Levothyroxine Sodium 125 Mcg Tabs (Levothyroxine Sodium) .[name].. 1 Tab By Mouth Daily 5)  Ventolin Hfa 108 (90 Base) Mcg/act Aers (Albuterol Sulfate) .... 2 Puffs Q 4 Hrs Prn 6)  Oxycodone Hcl 10 Mg Tabs (Oxycodone Hcl) .[name].. 1 Tab By Mouth Q 6 Hrs As Needed Severe Pain 7)  Flonase 50 Mcg/act Susp (Fluticasone Propionate) .... 2 Sprays/ Nostril Daily 8)  Effexor Xr 75 Mg Xr24h-Cap (Venlafaxine Hcl) .... Take 1 Cap By Mouth Once Daily 9)  Crestor 10 Mg Tabs (Rosuvastatin Calcium) .[name].. 1 Tab By Mouth Qhs 10)  Omeprazole 40 Mg Cpdr (Omeprazole) .[name].. 1 Tab By Mouth Daily, 30 Min Before Breakfast  Allergies (verified): 1)  ! Codeine 2)  ! *  Ultracet  Past History:  Past Medical History: Reviewed history from 07/03/2009 and no changes required. 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 GB breast lump, benign knee R and L hand 2007 R knee TKR 2010  Social History: Reviewed history from 06/05/2007 and no changes required. 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 Smokes 1 ppd x 43 yrs. 5 ETOH/ wk. Denies drug use. does not work out  Review of Systems      See HPI  Physical Exam  General:  alert and well-hydrated.   Head:  normocephalic and atraumatic.   Eyes:  conjunctiva clear, mild edema under both eyes Nose:  scant rhinorrhea Mouth:  pharynx pink and moist and fair dentition.   Neck:  no masses.   Lungs:  rhonchorous cough with diffuse exp wheezing.  poor air movement over both bases.  nonlabored  Heart:  no murmur and tachycardia.   Pulses:  cap RF < 2 sec Extremities:  no LE edema Skin:  color normal.   Cervical Nodes:  No lymphadenopathy noted Psych:  not anxious appearing.     Impression & Recommendations:  Problem # 1:  CHRONIC OBSTRUCTIVE PULMONARY DISEASE, ACUTE EXACERBATION (ICD-491.21) COPD exac secondary to initial URI.  Improved a little in the office after DuoNeb treatment.  Will treat with 5 days of Prednisone, 60 mg/ day + DuoNEBs at home 4 x a day (set up thru [facility]), Rocephin 1 gram IM x 1 now, followed by 5 days of Zithromax.  Avoid smoking.  Treat cough with Mucinex DM in the AM and Hycodan at night.  RTC Tues for f/u.  If getting worse, go to ED.  may need CXR if not seeing improvements in 48 hrs.   Orders: Home Health Referral [facility]) Rocephin  250mg  [phone]) Admin of Therapeutic Inj  intramuscular or subcutaneous (60454) Ipratropium inhalation  sol. unit dose (U9811) Albuterol Sulfate Sol 1mg  unit dose (B1478)  Complete Medication List: 1)  Alprazolam 1 Mg Tabs (Alprazolam) .[name].. 1 tab by mouth two times a day as needed anxiety 2)  Gabapentin 600 Mg Tabs (Gabapentin) .[name].. 1 tab by mouth tid 3)  Metformin Hcl 850 Mg Tabs (Metformin hcl) .[name].. 1 tab by mouth bid 4)  Levothyroxine Sodium 125 Mcg Tabs (Levothyroxine sodium) .[name].. 1 tab by mouth daily 5)  Ventolin Hfa 108 (90 Base) Mcg/act Aers (Albuterol sulfate) .... 2 puffs q 4 hrs prn 6)  Oxycodone Hcl 10 Mg Tabs (Oxycodone hcl) .[name].. 1 tab by mouth q 6 hrs as needed severe pain 7)  Flonase 50 Mcg/act Susp (Fluticasone propionate) .... 2 sprays/ nostril daily 8)  Effexor Xr 75 Mg Xr24h-cap (Venlafaxine hcl) .... Take 1 cap by mouth once daily 9)  Crestor 10 Mg Tabs (Rosuvastatin calcium) .[name].. 1 tab by mouth qhs 10)  Omeprazole 40 Mg Cpdr (Omeprazole) .[name].. 1 tab by mouth daily, 30 min before breakfast 11)  Prednisone 20 Mg Tabs (Prednisone) .... 3 tabs by mouth qam x 5 days 12)  Hydrocodone-homatropine 5-1.5 Mg/49ml Syrp (Hydrocodone-homatropine) .... 5 ml by mouth at bedtime as needed cough 13)  Duoneb 0.5-2.5 (3) Mg/27ml Soln (Ipratropium-albuterol) .... 3 ml neb 4 x a day 14)  Zithromax Z-pak 250 Mg Tabs (Azithromycin) .... 2 tabs by mouth x 1 day then 1 tab by mouth daily x 4 days  Patient Instructions: 1)  For COPD EXACERBATION: 2)  treat with Rocephin injection now. 3)  Zithromax x 5 days. 4)  DuoNEBs 4 x a day ([facility] to set up Today). 5)  Use Mucinex DM in the AM and Hycodan at night for cough. 6)  Take 60 mg Prednisone once a day (3 - 20 mg tabs) x 5 days. 7)  Do not smoke! 8)  Return for recheck lungs on Tuesday. Prescriptions: ZITHROMAX Z-PAK 250 MG TABS (AZITHROMYCIN) 2 tabs by mouth x 1 day then 1 tab by mouth daily x 4 days  #1 pack x 0   Entered and Authorized by:   Seymour Bars DO   Signed by:   Seymour Bars DO on 10/23/2009   Method used:   Electronically  to        [facility] [phone]* (retail)       [address]       [place], [place]  [address]       Ph:  [phone]       Fax: [phone]   RxID:   [id number] DUONEB 0.5-2.5 (3) MG/3ML SOLN (IPRATROPIUM-ALBUTEROL) 3 ml NEB 4 x a day  #1 box x 0   Entered and Authorized by:   Seymour Bars DO   Signed by:   Seymour Bars DO on 10/23/2009   Method used:   Electronically to        [facility] [phone]* (retail)       [address] [place], [place]  [address]       Ph: [phone]       Fax: [phone]   RxID:   [id number] HYDROCODONE-HOMATROPINE 5-1.5 MG/5ML SYRP (HYDROCODONE-HOMATROPINE) 5 ml by mouth at bedtime as needed cough  #100 ml x 0   Entered and Authorized by:   Seymour Bars DO   Signed by:   Seymour Bars DO on 10/23/2009   Method used:   Printed then faxed to ...       [facility] [phone]* (retail)       [address] [place], [place]  [address]       Ph: [phone]       Fax: [phone]   RxID:   [id number] PREDNISONE 20 MG TABS (PREDNISONE) 3 tabs by mouth qAM x 5 days  #15 x 0   Entered and Authorized by:   Seymour Bars DO   Signed by:   Seymour Bars DO on 10/23/2009   Method used:   Electronically to        [facility] [phone]* (retail)       [address]       [place], [place]  [address]       Ph: [phone]       Fax: [phone]   RxID:   [id number]    Medication Administration  Injection # 1:    Medication: Rocephin  250mg     Diagnosis: ACUTE FRONTAL SINUSITIS (ICD-461.1)    Route: IM    Site: RUOQ gluteus    Exp Date: 04/2012    Lot #: [id number]    Patient tolerated injection without complications    Given by: Payton Spark CMA (Oct 23, 2009 9:56 AM)  Medication # 1:    Medication: Albuterol Sulfate Sol 1mg  unit dose    Diagnosis: CHRONIC OBSTRUCTIVE PULMONARY DISEASE, ACUTE EXACERBATION (ICD-491.21)    Route: inhaled    Exp Date: 12/12    Lot #: [id number]    Patient tolerated medication  without complications    Given by: Payton Spark CMA (Oct 23, 2009 9:57 AM)  Medication # 2:    Medication: Ipratropium inhalation sol. unit dose    Diagnosis: CHRONIC OBSTRUCTIVE PULMONARY DISEASE, ACUTE EXACERBATION (ICD-491.21)    Route: inhaled    Given by: Payton Spark CMA (Oct 23, 2009 9:58 AM)  Orders Added: 1)  Home Health Referral [facility]] 2)  Rocephin  250mg  [J0696] 3)  Admin of Therapeutic Inj  intramuscular or subcutaneous [96372] 4)  Ipratropium inhalation sol. unit dose [J7644] 5)  Albuterol Sulfate Sol 1mg  unit dose [J7613] 6)  Est. Patient Level IV [25852]

## 2010-06-30 NOTE — Progress Notes (Signed)
Summary: Requests Vicoprofen  Phone Note Call from Patient   Summary of Call: Pt would like Rx for vicoprofen. She is trying to come off of the percocet and Dr. Oneal Grout is out on maternity leave. Please advise.  Initial call taken by: Payton Spark CMA,  July 14, 2009 9:33 AM    New/Updated Medications: VICOPROFEN 7.5-200 MG TABS (HYDROCODONE-IBUPROFEN) 1 tab by mouth three times a day as needed severe pain Prescriptions: VICOPROFEN 7.5-200 MG TABS (HYDROCODONE-IBUPROFEN) 1 tab by mouth three times a day as needed severe pain  #90 x 0   Entered and Authorized by:   Seymour Bars DO   Signed by:   Seymour Bars DO on 07/14/2009   Method used:   Printed then faxed to ...       [facility] [phone]* (retail)       [address] [place], [place]  [address]       Ph: [phone]       Fax: [phone]   RxID:   [id number]   Appended Document: Requests Vicoprofen Prescriptions: VICOPROFEN 7.5-200 MG TABS (HYDROCODONE-IBUPROFEN) 1 tab by mouth three times a day as needed severe pain  #90 x 0   Entered and Authorized by:   Seymour Bars DO   Signed by:   Seymour Bars DO on 07/14/2009   Method used:   Reprint   RxID:   [id number]   Appended Document: Requests Vicoprofen Rx faxed

## 2010-06-30 NOTE — Assessment & Plan Note (Signed)
Summary: DM f/u/ COPD   Vital Signs:  Patient profile:   60 year old female Height:      64.5 inches Weight:      128 pounds BMI:     21.71 O2 Sat:      94 % on Room air Pulse rate:   80 / minute BP sitting:   132 / 66  (left arm) Cuff size:   regular  Vitals Entered By: [name] CMA (December 04, 2009 8:55 AM)  O2 Flow:  Room air  Serial Vital Signs/Assessments:  Comments: 9:19 AM Peak Flow 250 Yellow Zone By: [name] CMA   CC: F/U DM   Primary Care Provider:  Seymour Bars DO  CC:  F/U DM.  History of Present Illness: 60 yo WF presents for f/u T2DM and COPD.  She is on metformin 850 mg two times a day and is not routinely checking her sugars.  She has fair compliance with her diet.  She does continue to smoke.  She has moderate to severe COPD.  She is using her DuoNebs about 2 x a day and her Symbicort 2 x a day.  She has not been rinsing her mouth out after using Symbicort as directed and now she is hoarse.  Denies any sore throat.  She had to complete a 2nd round of Prednisone in june for her COPD exacerbation + Hydrocodone cough syrup which did help some.  Her breathing has improved but she still has a cough.  Her mood has improved on Effexor XR, 75 mg/ day but she feels there is some room for improvement.      Current Medications (verified): 1)  Alprazolam 1 Mg  Tabs (Alprazolam) .[name].. 1 Tab By Mouth Two Times A Day As Needed Anxiety 2)  Gabapentin 600 Mg Tabs (Gabapentin) .[name].. 1 Tab By Mouth Tid 3)  Metformin Hcl 850 Mg Tabs (Metformin Hcl) .[name].. 1 Tab By Mouth Bid 4)  Levothyroxine Sodium 125 Mcg Tabs (Levothyroxine Sodium) .[name].. 1 Tab By Mouth Daily 5)  Ventolin Hfa 108 (90 Base) Mcg/act Aers (Albuterol Sulfate) .... 2 Puffs Q 4 Hrs Prn 6)  Oxycodone Hcl 10 Mg Tabs (Oxycodone Hcl) .[name].. 1 Tab By Mouth Q 6 Hrs As Needed Severe Pain 7)  Flonase 50 Mcg/act Susp (Fluticasone Propionate) .... 2 Sprays/ Nostril Daily 8)  Effexor Xr 75 Mg Xr24h-Cap (Venlafaxine  Hcl) .... Take 1 Cap By Mouth Once Daily 9)  Crestor 10 Mg Tabs (Rosuvastatin Calcium) .[name].. 1 Tab By Mouth Qhs 10)  Omeprazole 40 Mg Cpdr (Omeprazole) .[name].. 1 Tab By Mouth Daily, 30 Min Before Breakfast 11)  Duoneb 0.5-2.5 (3) Mg/86ml Soln (Ipratropium-Albuterol) .... 3 Ml Neb 4 X A Day 12)  Symbicort 160-4.5 Mcg/act Aero (Budesonide-Formoterol Fumarate) .[name].. 1 Puff Bid 13)  Reclast 5 Mg/166ml Soln (Zoledronic Acid) .... Iv Infusion Over 15 Minutes  Allergies (verified): 1)  ! Codeine 2)  ! * Ultracet  Past History:  Past Medical History: Reviewed history from 07/03/2009 and no changes required. 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 GB breast lump, benign knee R and L hand 2007 R knee TKR 2010  Family History: Reviewed history from 07/31/2008 and no changes required. Mother and father AMI in 35's mother: high chol, died of NHL father died, DM GM Hodgkin's  Lympoma sister:  primary biliary cirrhosis father, mother both had RA  Social History: Reviewed history from 06/05/2007 and no changes required. 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 Has 2 sons - 31 & 30. Smokes 1 ppd x 43 yrs. 5 ETOH/ wk. Denies drug use. does not work out  Review of Systems      See HPI  Physical Exam  General:  alert, well-developed, well-nourished, and well-hydrated.   Head:  normocephalic and atraumatic.   Eyes:  sclera non icteric Mouth:  pharynx pink and moist.  no thrush Neck:  no masses.   Lungs:  Normal respiratory effort, chest expands symmetrically. Lungs are clear to auscultation, no crackles or wheezes. Heart:  Normal rate and regular rhythm. S1 and S2 normal without gallop, murmur, click, rub or other extra sounds. Extremities:  no LE edema Skin:  color normal.     Psych:  good eye contact, not anxious appearing, and not depressed appearing.     Impression & Recommendations:  Problem # 1:  DM (ICD-250.00) Excellent control with A1C of 6.1.  Continue current treatment.  Check AM fastings. Her updated medication list for this problem includes:    Metformin Hcl 850 Mg Tabs (Metformin hcl) .[name]... 1 tab by mouth bid  Orders: Fingerstick (36416) Hemoglobin A1C (83036)  Labs Reviewed: Creat: 0.70 (07/03/2009)   Microalbumin: 10 (12/20/2008)  Last Eye Exam: no retinopathy (Dr [name]) (06/23/2009) Reviewed HgBA1c results: 6.1 (12/04/2009)  6.4 (07/03/2009)  Problem # 2:  COPD (ICD-496) Symptoms have improved after flare up that began in May.  Strongly encouraged smoking cessation.  Will treat for presumed esophageal thrush with Nystatin Swish and Swallow for hoarsness after not rinsing mouth out after using Symbicort.  Continue DUONEB, increase to 3-4 x a day and continue Symbicort two times a day.  If PFs not improved at 6 wks f/u visit, will get her into Dr [name]. Pulmonary Functions Reviewed: O2 sat: 94 (12/04/2009)     Vaccines Reviewed: Pneumovax: Pneumovax (05/31/2001)   Flu Vax: Fluvax 3+ (03/14/2008)  Problem # 3:  UNSPECIFIED HYPOTHYROIDISM (ICD-244.9)  Her updated medication list for this problem includes:    Levothyroxine Sodium 125 Mcg Tabs (Levothyroxine sodium) .[name]... 1 tab by mouth daily  Orders: T-TSH [phone])  Labs Reviewed: TSH: 1.143 (05/21/2009)    HgBA1c: 6.1 (12/04/2009) Chol: 284 (07/03/2009)   HDL: 69 (07/03/2009)   LDL: 170 (07/03/2009)   TG: 224 (07/03/2009)  Problem # 4:  DEPRESSION (ICD-311) Increase Effexor XR up to 150 mg/ day.  F/U mood with a PHQ-9 in 6 wks. Her updated medication list for this problem includes:    Alprazolam 1 Mg Tabs (Alprazolam) .[name]... 1 tab by mouth two times a day as needed anxiety    Effexor Xr 150 Mg Xr24h-cap (Venlafaxine hcl) .[name]... 1 capsule by mouth once a day  Complete  Medication List: 1)  Alprazolam 1 Mg Tabs (Alprazolam) .[name].. 1 tab by mouth two times a day as needed anxiety 2)  Gabapentin 600 Mg Tabs (Gabapentin) .[name].. 1 tab by mouth tid 3)  Metformin Hcl 850 Mg Tabs (Metformin hcl) .[name].. 1 tab by mouth bid 4)  Levothyroxine Sodium 125 Mcg Tabs (Levothyroxine sodium) .[name].. 1 tab by mouth daily 5)  Ventolin Hfa 108 (90 Base) Mcg/act Aers (Albuterol sulfate) .... 2 puffs q 4 hrs prn 6)  Oxycodone Hcl 10 Mg Tabs (Oxycodone hcl) .[name].. 1 tab by mouth q 6 hrs as needed severe pain 7)  Flonase 50 Mcg/act Susp (  Fluticasone propionate) .... 2 sprays/ nostril daily 8)  Effexor Xr 150 Mg Xr24h-cap (Venlafaxine hcl) .[name].. 1 capsule by mouth once a day 9)  Omeprazole 40 Mg Cpdr (Omeprazole) .[name].. 1 tab by mouth daily, 30 min before breakfast 10)  Duoneb 0.5-2.5 (3) Mg/28ml Soln (Ipratropium-albuterol) .... 3 ml neb 4 x a day 11)  Symbicort 160-4.5 Mcg/act Aero (Budesonide-formoterol fumarate) .[name].. 1 puff bid 12)  Reclast 5 Mg/127ml Soln (Zoledronic acid) .... Iv infusion over 15 minutes 13)  Nystatin 100000 Unit/ml Susp (Nystatin) .... 5 ml by mouth swish and swallow 4 x a day x 7 days  Other Orders: T-LDL Direct ([id number]) T-Liver Profile [phone]) T-Gamma GT (GGT) ([phone])  Patient Instructions: 1)  A1C PERFECT AT 6.1 2)  CONTINUE METFORMIN TIWCE A DAY. 3)  CHECK LABS DOWNSTAIRS TODAY. 4)  WILL CALL YOU W/ RESULTS TOMORROW. 5)  TREAT FOR ESOPHAGEAL THRUSH WITH NYSTATIN SWISH AND SWALLOW X 1 WK. 6)  RINSE MOUTH OUT AFTER EACH USE OF SYMBICORT INHALER. 7)  INCREASE EFFEXOR TO 150 MG ONCE A DAY. 8)  F/U MOOD/ COPD IN 6 WKS. Prescriptions: NYSTATIN 100000 UNIT/ML SUSP (NYSTATIN) 5 ml by mouth swish and swallow 4 x a day x 7 days  #140 ml x 0   Entered and Authorized by:   Melissa Bars DO   Signed by:   Melissa Bars DO on 12/04/2009   Method used:   Electronically to        [facility] [phone]* (retail)       [address]       [place], [place]   [address]       Ph: [phone]       Fax: [phone]   RxID:   [id number] EFFEXOR XR 150 MG XR24H-CAP (VENLAFAXINE HCL) 1 capsule by mouth once a day  #30 x 3   Entered and Authorized by:   Melissa Bars DO   Signed by:   Melissa Bars DO on 12/04/2009   Method used:   Electronically to        [facility] [phone]* (retail)       [address]       [place], [place]  [address]       Ph: [phone]       Fax: [phone]   RxID:   [id number]   Laboratory Results   Blood Tests     HGBA1C: 6.1%   (Normal Range: Non-Diabetic - 3-6%   Control Diabetic - 6-8%)
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
## 2010-06-30 NOTE — Miscellaneous (Signed)
Summary: no retinopathy  Clinical Lists Changes  Observations: Added new observation of DIAB EYE EX: no retinopathy (Dr Clearance Coots) (06/23/2009 13:08)

## 2010-06-30 NOTE — Progress Notes (Signed)
Summary: Alprazolam refills  Phone Note Refill Request   Refills Requested: Medication #1:  ALPRAZOLAM 1 MG  TABS 1 tab by mouth two times a day as needed anxiety Initial call taken by: Payton Spark CMA,  Oct 01, 2009 4:12 PM    Prescriptions: ALPRAZOLAM 1 MG  TABS (ALPRAZOLAM) 1 tab by mouth two times a day as needed anxiety  #60 x 0   Entered and Authorized by:   Seymour Bars DO   Signed by:   Seymour Bars DO on 10/01/2009   Method used:   Printed then faxed to ...       [facility] [phone]* (retail)       [address] [place], [place]  [address]       Ph: [phone]       Fax: [phone]   RxID:   [id number]   Appended Document: Alprazolam refills Rx faxed

## 2010-06-30 NOTE — Progress Notes (Signed)
Summary: Rash  Phone Note Call from Patient   Caller: Patient Summary of Call: Pt states she stopped singulair and symbicort and shortly after, the rash resolved. Pt "knows" the rash has come from these meds and does not wish to continue them. Pt would like to know what you suggest and when you would like for her to f/u. Please advise. Initial call taken by: Payton Spark CMA,  March 18, 2010 9:28 AM  Follow-up for Phone Call        OK.  Stop both singular and symbicort.  If rash recurs, she needs to see the dermatologist.  Start Claritin (loratadine) daily for allergies and replace Symbicort with Spiriva once daily for COPD.  DuoNebs changed to plain Albuterol.  REturn for f/u in 3 wks. Follow-up by: Seymour Bars DO,  March 18, 2010 11:35 AM   New Allergies: ! SINGULAIR (MONTELUKAST SODIUM) ! SYMBICORT (BUDESONIDE-FORMOTEROL FUMARATE) New/Updated Medications: ALBUTEROL SULFATE (5 MG/ML) 0.5% NEBU (ALBUTEROL SULFATE) 5 mg neb q 4-6 hrs prn CLARITIN 10 MG TABS (LORATADINE) 1 tab by mouth daily (OTC) SPIRIVA HANDIHALER 18 MCG CAPS (TIOTROPIUM BROMIDE MONOHYDRATE) 1 capsule inhalation daily New Allergies: ! SINGULAIR (MONTELUKAST SODIUM) ! SYMBICORT (BUDESONIDE-FORMOTEROL FUMARATE)  Appended Document: Rash Pt aware of the above. Pt states that it is Advair not singulair that she is allergic to. Arvilla Market CMAMarcelino Duster March 18, 2010 1:43 PM   what about the symbicort?  Seymour Bars, D.O.  Yes. Pt states the rash came from symbicort and Advair. Arvilla Market CMA, Michelle March 18, 2010 2:43 PM  OK... change over to Spiriva.  This is a completely different class of COPD medication.  Seymour Bars, D.O.

## 2010-06-30 NOTE — Progress Notes (Signed)
Summary: Rash  Phone Note Call from Patient   Caller: Patient Summary of Call: Pt states rash on face has come back again. Pt would like to know what she should do from here.  Initial call taken by: Payton Spark CMA,  February 10, 2010 10:22 AM  Follow-up for Phone Call        I scheduled her with derm for this recurring rash.  It looks like she cancelled it.  I have seen her multiple x's for this rash and I really want her to see derm since it is not improving. Follow-up by: Seymour Bars DO,  February 10, 2010 11:54 AM  Additional Follow-up for Phone Call Additional follow up Details #1::        [facility] informing Pt of the above Additional Follow-up by: Payton Spark CMA,  February 10, 2010 11:58 AM

## 2010-06-30 NOTE — Assessment & Plan Note (Signed)
Summary: sinusitis   Vital Signs:  Patient profile:   60 year old female Height:      64.5 inches Weight:      136 pounds BMI:     23.07 O2 Sat:      97 % on Room air Temp:     98.7 degrees F oral Pulse rate:   98 / minute BP sitting:   128 / 86  (left arm) Cuff size:   regular  Vitals Entered By: Payton Spark CMA (September 10, 2009 10:45 AM)  O2 Flow:  Room air CC: Head congestion, HA, nasal congestion ? sinusitis x 2 weeks.    Primary Care Provider:  Seymour Bars DO  CC:  Head congestion, HA, and nasal congestion ? sinusitis x 2 weeks. [name]  History of Present Illness: 60 yo WF presents for head congestion and green rhinorrhea x 10 days.  She plans to fly to [facility] this weekend.  Her sister is in the hospital and is not doing well.    She is having HAs but no fever.  She has a sore throat and fatigue.  She has a cough and SOB.  She continues to smoke.  She is taking Mucinex D which helps some.    She has Allergies.  Has not had a sinus infection in about 6 mos.  She has run out of her inhaler and flonase and has not been taking anything for allergies.    Current Medications (verified): 1)  Alprazolam 1 Mg  Tabs (Alprazolam) .[name].. 1 Tab By Mouth Two Times A Day As Needed Anxiety 2)  Gabapentin 600 Mg Tabs (Gabapentin) .[name].. 1 Tab By Mouth Tid 3)  Metformin Hcl 850 Mg Tabs (Metformin Hcl) .[name].. 1 Tab By Mouth Bid 4)  Levothyroxine Sodium 125 Mcg Tabs (Levothyroxine Sodium) .[name].. 1 Tab By Mouth Daily 5)  Proair Hfa 108 (90 Base) Mcg/act  Aers (Albuterol Sulfate) .... 2 Puffs Q 6 Hrs Prn 6)  Oxycodone Hcl 10 Mg Tabs (Oxycodone Hcl) .[name].. 1 Tab By Mouth Q 6 Hrs As Needed Severe Pain 7)  Flonase 50 Mcg/act Susp (Fluticasone Propionate) .... 2 Sprays/ Nostril Daily 8)  Venlafaxine Hcl 37.5 Mg Xr24h-Cap (Venlafaxine Hcl) .... 2 Capsules By Mouth Once Daily 9)  Crestor 10 Mg Tabs (Rosuvastatin Calcium) .[name].. 1 Tab By Mouth Qhs 10)  Omeprazole 40 Mg Cpdr (Omeprazole) .[name].. 1 Tab By Mouth Daily,  30 Min Before Breakfast 11)  Promethazine Hcl 25 Mg Tabs (Promethazine Hcl) .[name].. 1 Tab By Mouth Q 6 Hrs As Needed Nausea 12)  Triamcinolone Acetonide 0.5 % Crea (Triamcinolone Acetonide) .... Apply To Hand Rash Two Times A Day X 10 Days  Allergies (verified): 1)  ! Codeine 2)  ! * Ultracet  Past History:  Past Medical History: Reviewed history from 07/03/2009 and no changes required. 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 GB breast lump, benign knee R and L hand 2007 R knee TKR 2010  Social History: Reviewed history from 06/05/2007 and no changes required. 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 Has 2 sons - 3 & 30. Smokes 1 ppd x 43 yrs. 5 ETOH/ wk. Denies drug use. does not work out  Review of Systems      See HPI  Physical Exam  General:  alert, well-developed, well-nourished, and  well-hydrated.   Head:  normocephalic and atraumatic.  frontal sinus TTP Eyes:  conjunctiva clear, eyes slightly watery Ears:  R TM is opague, white.  L TM normal Nose:  clear rhinorrhea with boggy turbinates, nasal congestion present Mouth:  Oropharynx clear with no lesions or exudate.  Neck:  no masses.   Lungs:  normal respiratory effort.  prolonged exp phase with bibasilar rhonchi and exp wheezing. Heart:  normal rate, regular rhythm, and no murmur.   Skin:  color normal.   Cervical Nodes:  shotty submandibular LA   Impression & Recommendations:  Problem # 1:  ACUTE FRONTAL SINUSITIS (ICD-461.1) Secondary to seasonal allergies.  treat with 10 days Augmentin, Claritin D and Flonase.  Call if not improved in 10 days.  Use advil as needed HA pain. Her updated medication list for this problem includes:    Flonase 50 Mcg/act Susp (Fluticasone propionate) .[name]... 2 sprays/  nostril daily    Amoxicillin-pot Clavulanate 875-125 Mg Tabs (Amoxicillin-pot clavulanate) .[name]... 1 tab by mouth q 12 hrs x 10 days  Problem # 2:  COUGH (ICD-786.2) Cough with wheezing, smoker. RFd her Ventolin inhaler today, use 4 x a day for the next wk, then change to as needed.  Avoid smoking. Plan PFTs this year.  Complete Medication List: 1)  Alprazolam 1 Mg Tabs (Alprazolam) .[name].. 1 tab by mouth two times a day as needed anxiety 2)  Gabapentin 600 Mg Tabs (Gabapentin) .[name].. 1 tab by mouth tid 3)  Metformin Hcl 850 Mg Tabs (Metformin hcl) .[name].. 1 tab by mouth bid 4)  Levothyroxine Sodium 125 Mcg Tabs (Levothyroxine sodium) .[name].. 1 tab by mouth daily 5)  Ventolin Hfa 108 (90 Base) Mcg/act Aers (Albuterol sulfate) .... 2 puffs q 4 hrs prn 6)  Oxycodone Hcl 10 Mg Tabs (Oxycodone hcl) .[name].. 1 tab by mouth q 6 hrs as needed severe pain 7)  Flonase 50 Mcg/act Susp (Fluticasone propionate) .... 2 sprays/ nostril daily 8)  Venlafaxine Hcl 37.5 Mg Xr24h-cap (Venlafaxine hcl) .... 2 capsules by mouth once daily 9)  Crestor 10 Mg Tabs (Rosuvastatin calcium) .[name].. 1 tab by mouth qhs 10)  Omeprazole 40 Mg Cpdr (Omeprazole) .[name].. 1 tab by mouth daily, 30 min before breakfast 11)  Amoxicillin-pot Clavulanate 875-125 Mg Tabs (Amoxicillin-pot clavulanate) .[name].. 1 tab by mouth q 12 hrs x 10 days  Patient Instructions: 1)  Take 10 days of Augmentin for sinusitis. 2)  Use Claritin D for head congestion + Flonase. 3)  Use Tylenol or Advil as needed for Headaches. 4)  Avoid smoking. 5)  Use Ventolin inhaler 2 puffs 4 x a day for the next wk, then AS NEEDED.   6)  Call if not improved in 10 days. Prescriptions: AMOXICILLIN-POT CLAVULANATE 875-125 MG TABS (AMOXICILLIN-POT CLAVULANATE) 1 tab by mouth q 12 hrs x 10 days  #20 x 0   Entered and Authorized by:   Seymour Bars DO   Signed by:   Seymour Bars DO on 09/10/2009   Method used:   Electronically to        [facility] [phone]* (retail)       [address] [place], [place]  [address]       Ph: [phone]       Fax: [phone]   RxID:   [id number] FLONASE 50 MCG/ACT SUSP (FLUTICASONE PROPIONATE) 2 sprays/ nostril daily  #1 bottle x 3   Entered and Authorized by:   Clydie Braun  Leoncio Hansen DO   Signed by:   Seymour Bars DO on 09/10/2009   Method used:   Electronically to        [facility] [phone]* (retail)       [address] [place], [place]  [address]       Ph: [phone]       Fax: [phone]   RxID:   [id number] VENTOLIN HFA 108 (90 BASE) MCG/ACT AERS (ALBUTEROL SULFATE) 2 puffs q 4 hrs prn  #1 x 1   Entered and Authorized by:   Seymour Bars DO   Signed by:   Seymour Bars DO on 09/10/2009   Method used:   Electronically to        [facility] [phone]* (retail)       [address]       [place], [place]  [address]       Ph: [phone]       Fax: [phone]   RxID:   [id number]

## 2010-06-30 NOTE — Assessment & Plan Note (Signed)
Summary: chronic OA pain   Vital Signs:  Patient profile:   60 year old female Height:      64.5 inches Weight:      128 pounds O2 Sat:      98 % on Room air Temp:     98.4 degrees F oral Pulse rate:   111 / minute BP sitting:   136 / 86  (left arm) Cuff size:   regular  Vitals Entered By: Payton Spark CMA (August 05, 2009 3:36 PM)  O2 Flow:  Room air CC: Rash on R hand x 1 year. Also c/o withdrawls from oxycodone   Primary Care Provider:  Seymour Bars DO  CC:  Rash on R hand x 1 year. Also c/o withdrawls from oxycodone.  History of Present Illness: Ms. Janssens is a 60 year old woman who presents with chronic knee pain after having knee replacement.  She also has some chronic back pain, and rash on the dorsal surfaces of her hands. She has had knee pain since her knee replacement 3.5 months ago. She last saw ortho on 2/1 and is scheduled to see the pain clinic on 2022/09/15. Her father-in-law died a few weeks ago so she was running around a lot and took more than her allotted 3 Vicoprofen per day. She ran out of her prescription for 8 days ago and since then has had nausea, chills, SOB, and has not eaten. For pain she has been taking 1000mg  of ibuprofen 4 times a day. She has had continued pain for arthritis in her back for which she is taking Celebrex.   She also has a rash on the dorsal surfaces of both of her hands. She was bitten by a bug last summer and feels that every 6 weeks this rash appears and gets worse. It is itchy and flaky. She has been using vaseline intensive care cream and does feel that it helps.   Current Medications (verified): 1)  Alprazolam 1 Mg  Tabs (Alprazolam) .[name].. 1 Tab By Mouth Two Times A Day As Needed Anxiety 2)  Gabapentin 600 Mg Tabs (Gabapentin) .[name].. 1 Tab By Mouth Tid 3)  Metformin Hcl 850 Mg Tabs (Metformin Hcl) .[name].. 1 Tab By Mouth Bid 4)  Levothyroxine Sodium 125 Mcg Tabs (Levothyroxine Sodium) .[name].. 1 Tab By Mouth Daily 5)  Proair Hfa 108 (90 Base)  Mcg/act  Aers (Albuterol Sulfate) .... 2 Puffs Q 6 Hrs Prn 6)  Vicoprofen 7.5-200 Mg Tabs (Hydrocodone-Ibuprofen) .[name].. 1 Tab By Mouth Three Times A Day As Needed Severe Pain 7)  Flonase 50 Mcg/act Susp (Fluticasone Propionate) .... 2 Sprays/ Nostril Daily 8)  Venlafaxine Hcl 37.5 Mg Xr24h-Cap (Venlafaxine Hcl) .[name].. 1capsule By Mouth Daily X 1 Wk Then 2 Capsules By Mouth Once Daily 9)  Crestor 10 Mg Tabs (Rosuvastatin Calcium) .[name].. 1 Tab By Mouth Qhs 10)  Omeprazole 40 Mg Cpdr (Omeprazole) .[name].. 1 Tab By Mouth Daily, 30 Min Before Breakfast  Allergies (verified): 1)  ! Codeine 2)  ! * Ultracet  Past History:  Past Medical History: Reviewed history from 07/03/2009 and no changes required. OA cervicalgia chronic pain syndrome (Dr Oneal Grout); was on Methadone Hx of narcotic Abuse anxiety DM , 2002 ([facility]) hypothryroid high chol COPD osteoporosis fatty liver dz  Review of Systems      See HPI  Physical Exam  General:  WDWN female in no acute distress.  Head:  Normocephalic and atraumatic.  Eyes:  Sclera mildly injected. Pupils equal, round, and reactive to light.  Nose:  No rhinorrhea.  Mouth:  Oropharynx clear with no lesions or exudate.  Neck:  Supple with no lymphadenopathy.  Lungs:  Clear to auscultation bilaterally, normal work of breathing.  Heart:  Tachycardia with normal  rhythm with normal S1 and S2. No murmur, rub, or gallop.  Msk:  R TKR scar well healed (4 mos old) Pulses:  2+ radial pulses bilaterally.  Extremities:  no LE edema Skin:  Confluent maculopapular rash on dorsal surfaces of bilateral hands, R>L. Dry and flaky.  Well-healed surgical scar across anterior surface of R knee.  Psych:  good eye contact, not depressed appearing, and slightly anxious.     Impression & Recommendations:  Problem # 1:  CHRONIC PAIN SYNDROME (ICD-338.4) Prescribed a bridge of oxycodone to treat pt's knee and back pain until her appointment with Dr. Oneal Grout on 3/22. Nausea, chills  and other symptoms of withdrawal should improve. Will start promethazine for nausea as needed.  Advised pt to not exceed the prescirbed quantity of narcotics.  Concerned given her hx of narcotic abuse.  Her updated medication list for this problem includes:    Promethazine Hcl 25 Mg Tabs (Promethazine hcl) .[name]... 1 tab by mouth q 6 hrs as needed nausea  Problem # 2:  DERMATITIS (ICD-692.9) Bilateral dermatitis on dorsal surfaces of hands. Will start triamcinolone x10 days. Instructed patient to apply at night and wear gloves for optimal absorption.   Her updated medication list for this problem includes:    Triamcinolone Acetonide 0.5 % Crea (Triamcinolone acetonide) .[name]... Apply to hand rash two times a day x 10 days  Complete Medication List: 1)  Alprazolam 1 Mg Tabs (Alprazolam) .[name].. 1 tab by mouth two times a day as needed anxiety 2)  Gabapentin 600 Mg Tabs (Gabapentin) .[name].. 1 tab by mouth tid 3)  Metformin Hcl 850 Mg Tabs (Metformin hcl) .[name].. 1 tab by mouth bid 4)  Levothyroxine Sodium 125 Mcg Tabs (Levothyroxine sodium) .[name].. 1 tab by mouth daily 5)  Proair Hfa 108 (90 Base) Mcg/act Aers (Albuterol sulfate) .... 2 puffs q 6 hrs prn 6)  Oxycodone Hcl 10 Mg Tabs (Oxycodone hcl) .[name].. 1 tab by mouth q 6 hrs as needed severe pain 7)  Flonase 50 Mcg/act Susp (Fluticasone propionate) .... 2 sprays/ nostril daily 8)  Venlafaxine Hcl 37.5 Mg Xr24h-cap (Venlafaxine hcl) .[name].. 1capsule by mouth daily x 1 wk then 2 capsules by mouth once daily 9)  Crestor 10 Mg Tabs (Rosuvastatin calcium) .[name].. 1 tab by mouth qhs 10)  Omeprazole 40 Mg Cpdr (Omeprazole) .[name].. 1 tab by mouth daily, 30 min before breakfast 11)  Promethazine Hcl 25 Mg Tabs (Promethazine hcl) .[name].. 1 tab by mouth q 6 hrs as needed nausea 12)  Triamcinolone Acetonide 0.5 % Crea (Triamcinolone acetonide) .... Apply to hand rash two times a day x 10 days  Patient Instructions: 1)  Use RX hand cream x 10 days. 2)  F/U with Dr Oneal Grout for  pain. 3)  Use Phenergan as needed for nausea. Prescriptions: TRIAMCINOLONE ACETONIDE 0.5 % CREA (TRIAMCINOLONE ACETONIDE) apply to hand rash two times a day x 10 days  #15 g x 0   Entered and Authorized by:   Seymour Bars DO   Signed by:   Seymour Bars DO on 08/05/2009   Method used:   Electronically to        [facility] [phone][facility] (retail)       [address]  [place], [place]  [address]       Ph: [phone] or [phone]       Fax: [phone]   RxID:   [id number] PROMETHAZINE HCL 25 MG TABS (PROMETHAZINE HCL) 1 tab by mouth q 6 hrs as needed nausea  #30 x 0   Entered and Authorized by:   Seymour Bars DO   Signed by:   Seymour Bars DO on 08/05/2009   Method used:   Electronically to        [facility] [phone][facility] (retail)       [address] [place], [place]  [address]       Ph: [phone] or [phone]       Fax: [phone]   RxID:   [id number] OXYCODONE HCL 10 MG TABS (OXYCODONE HCL) 1 tab by mouth q 6 hrs as needed severe pain  #56 x 0   Entered and Authorized by:   Seymour Bars DO   Signed by:   Seymour Bars DO on 08/05/2009   Method used:   Print then Give to Patient   RxID:   [id number]

## 2010-06-30 NOTE — Progress Notes (Signed)
Summary: Requests prednisone  Phone Note Call from Patient   Caller: Patient Summary of Call: Pt states she has the rash on her face, hands and arms again. Pt would like to know if you will refill the prednisone. Please advise. Initial call taken by: Payton Spark CMA,  January 09, 2010 12:24 PM  Follow-up for Phone Call        I will change her to a Medrol Dose Pack.  If still not resolved, I am going to get her allergy tested. Follow-up by: Seymour Bars DO,  January 09, 2010 12:34 PM    New/Updated Medications: MEDROL (PAK) 4 MG TABS (METHYLPREDNISOLONE) take as directed Prescriptions: MEDROL (PAK) 4 MG TABS (METHYLPREDNISOLONE) take as directed  #1 pack x 0   Entered and Authorized by:   Seymour Bars DO   Signed by:   Seymour Bars DO on 01/09/2010   Method used:   Electronically to        [facility] [phone]* (retail)       [address]       [place], [place]  [address]       Ph: [phone]       Fax: [phone]   RxID:   [id number]   Appended Document: Requests prednisone Pt aware of the above

## 2010-06-30 NOTE — Progress Notes (Signed)
Summary: RF thyroid meds  Phone Note Refill Request   Refills Requested: Medication #1:  LEVOTHYROXINE SODIUM 125 MCG TABS 1 tab by mouth daily Initial call taken by: Payton Spark CMA,  December 05, 2009 3:41 PM    Prescriptions: LEVOTHYROXINE SODIUM 125 MCG TABS (LEVOTHYROXINE SODIUM) 1 tab by mouth daily  #30 x 5   Entered and Authorized by:   Seymour Bars DO   Signed by:   Seymour Bars DO on 12/05/2009   Method used:   Electronically to        [facility] [phone]* (retail)       [address]       [place], [place]  [address]       Ph: [phone]       Fax: [phone]   RxID:   [id number]

## 2010-06-30 NOTE — Progress Notes (Signed)
Summary: question about med dosage and needs a lab order  Phone Note Call from Patient   Caller: Patient Summary of Call: Pt. would like to have her fasting labs drawn the 19th of this month and will you fax her lab order downstairs to lab? Call patient  [phone] if you have any questions.... She also has a question about her medicine.. She states she is out of her Thyroid med. but was not sure if she is taking the right dosage or not? [facility] [place] Initial call taken by: [name],  December 09, 2009 10:00 AM  Follow-up for Phone Call        Labs ordered and questions answered. Follow-up by: Payton Spark CMA,  December 09, 2009 10:24 AM

## 2010-06-30 NOTE — Progress Notes (Signed)
Summary: pt. with a question  Phone Note Call from Patient Call back at [facility] Phone [phone]   Summary of Call: Call pt back at her home number.... She would like to know that when she comes for her next appt ( PFT ) she can also be tested for diabetes while she is here.. Thanks.[name]  November 26, 2009 10:46 AM  Initial call taken by: [name],  November 26, 2009 10:46 AM  Follow-up for Phone Call        Pt states sugars have been running higher than usual. Pt asked if DM f/u and PFT could be done together and I informed her that these would have to be 2 seperate apts. I told Pt to hold on the PFT and f/u for DM next week.  Follow-up by: Payton Spark CMA,  November 26, 2009 1:00 PM     Appended Document: pt. with a question agrees, let's do her DM f/u visit.Seymour Bars, D.O.  Appended Document: pt. with a question Pt aware

## 2010-06-30 NOTE — Progress Notes (Signed)
Summary: RF Xanax  Phone Note Refill Request Message from:  Patient on December 30, 2009 1:19 PM  Refills Requested: Medication #1:  ALPRAZOLAM 1 MG  TABS 1 tab by mouth two times a day as needed anxiety Initial call taken by: Payton Spark CMA,  December 30, 2009 1:19 PM    Prescriptions: ALPRAZOLAM 1 MG  TABS (ALPRAZOLAM) 1 tab by mouth two times a day as needed anxiety  #60 x 0   Entered and Authorized by:   Seymour Bars DO   Signed by:   Seymour Bars DO on 12/30/2009   Method used:   Printed then faxed to ...       [facility] [phone]* (retail)       [address] [place], [place]  [address]       Ph: [phone]       Fax: [phone]   RxID:   [id number]

## 2010-06-30 NOTE — Letter (Signed)
Summary: [facility]   Imported By: Lanelle Bal 07/02/2009 08:45:19  _____________________________________________________________________  External Attachment:    Type:   Image     Comment:   External Document

## 2010-06-30 NOTE — Progress Notes (Signed)
Summary: Boniva and lab ?  Phone Note Call from Patient   Caller: Patient Summary of Call: Pt states you discussed starting her on Boniva and wanted to know if you still planned on doing so. Pt also thought she was supposed to go to the lab but I was unable to find any notes or orders for the lab. Please advise.  Initial call taken by: Payton Spark CMA,  August 21, 2009 10:29 AM  Follow-up for Phone Call        It was actually for RECLAST infusion not Boniva.  Is she interested in setting this up at short stay in the hospital and [facility] hospital? Follow-up by: Seymour Bars DO,  August 21, 2009 10:43 AM  Additional Follow-up for Phone Call Additional follow up Details #1::        [facility] informing Pt. Pt to CB w/ details Additional Follow-up by: Payton Spark CMA,  August 21, 2009 11:17 AM

## 2010-06-30 NOTE — Progress Notes (Signed)
Summary: Rash no better  Phone Note Call from Patient   Caller: Patient Summary of Call: Pt states the rash has not improved. Please advise. Initial call taken by: Payton Spark CMA,  January 22, 2010 12:26 PM  Follow-up for Phone Call        let's get her into derm. Follow-up by: Seymour Bars DO,  January 22, 2010 12:35 PM     Appended Document: Rash no better Pt states she is unable to afford another specialist right now. Pt requests another round of steroids to be sent to her pharm.  Appended Document: Rash no better Prednisone Dose Pack sent to her pharmacy.  Call if not improving in 1 wk Seymour Bars, D.O.  Appended Document: Rash no better 01/23/2010 @ 11:34am- Pt notifeid of med sent to pharmacy via VM. Pt scheduled OV with Dr. Linford Arnold today for this rash. KJ LPN

## 2010-06-30 NOTE — Assessment & Plan Note (Signed)
Summary: f/u COPD exacerbation   Vital Signs:  Patient profile:   60 year old female Height:      64.5 inches Weight:      128 pounds BMI:     21.71 O2 Sat:      92 % on Room air Temp:     98.0 degrees F oral Pulse rate:   93 / minute BP sitting:   132 / 74  (left arm) Cuff size:   regular  Vitals Entered By: Payton Spark CMA (October 29, 2009 2:23 PM)  O2 Flow:  Room air  Serial Vital Signs/Assessments:  Comments: 2:27 PM Peak Flow 250 Yellow Zone By: Payton Spark CMA   CC: F/U.    Primary Care Provider:  Seymour Bars DO  CC:  F/U. [name]  History of Present Illness: 60 yo WF presesnts for f/u COPD exacerbation.  Last wk, she was treated with Rocephin + 5 days of Zithromax, 5 days of Prednisone and started on duoneb thru [employer] 4 x a day.  She has tried to cut back on her smoking.  She could not stand the taste of the Hycodan.  Still taking Mucinex.  She is still having deep cough with SOB and wheezing.  Not producing much phlegm.  Low energy and appetitie.  Denies fevers.    Current Medications (verified): 1)  Alprazolam 1 Mg  Tabs (Alprazolam) .[name].. 1 Tab By Mouth Two Times A Day As Needed Anxiety 2)  Gabapentin 600 Mg Tabs (Gabapentin) .[name].. 1 Tab By Mouth Tid 3)  Metformin Hcl 850 Mg Tabs (Metformin Hcl) .[name].. 1 Tab By Mouth Bid 4)  Levothyroxine Sodium 125 Mcg Tabs (Levothyroxine Sodium) .[name].. 1 Tab By Mouth Daily 5)  Ventolin Hfa 108 (90 Base) Mcg/act Aers (Albuterol Sulfate) .... 2 Puffs Q 4 Hrs Prn 6)  Oxycodone Hcl 10 Mg Tabs (Oxycodone Hcl) .[name].. 1 Tab By Mouth Q 6 Hrs As Needed Severe Pain 7)  Flonase 50 Mcg/act Susp (Fluticasone Propionate) .... 2 Sprays/ Nostril Daily 8)  Effexor Xr 75 Mg Xr24h-Cap (Venlafaxine Hcl) .... Take 1 Cap By Mouth Once Daily 9)  Crestor 10 Mg Tabs (Rosuvastatin Calcium) .[name].. 1 Tab By Mouth Qhs 10)  Omeprazole 40 Mg Cpdr (Omeprazole) .[name].. 1 Tab By Mouth Daily, 30 Min Before Breakfast 11)  Duoneb 0.5-2.5 (3) Mg/44ml Soln  (Ipratropium-Albuterol) .... 3 Ml Neb 4 X A Day  Allergies (verified): 1)  ! Codeine 2)  ! * Ultracet  Past History:  Past Medical History: Reviewed history from 07/03/2009 and no changes required. 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 GB breast lump, benign knee R and L hand 2007 R knee TKR 2010  Social History: Reviewed history from 06/05/2007 and no changes required. 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 Has 2 sons - 41 & 30. Smokes 1 ppd x 43 yrs. 5 ETOH/ wk. Denies drug use. does not work out  Review of Systems      See HPI  Physical Exam  General:  alert, well-developed, well-nourished, and well-hydrated.   Head:  normocephalic and atraumatic.   Eyes:  conjunctiva clear Nose:  no nasal discharge.   Mouth:  pharynx pink and moist.   Neck:  no masses.   Lungs:  diffuse rhonchi with bibasilar exp wheezing.  nonlabored.  no  tachypnea or accessory muscle use Heart:  normal rate, regular rhythm, and no murmur.   Pulses:  2+ radial pulses Extremities:  no E/C/C Skin:  color normal.   Cervical Nodes:  No lymphadenopathy noted Psych:  good eye contact, not anxious appearing, and not depressed appearing.     Impression & Recommendations:  Problem # 1:  CHRONIC OBSTRUCTIVE PULMONARY DISEASE, ACUTE EXACERBATION (ICD-491.21) Slow to improve with continued rhonchi, wheezing, dyspnea and hypoxemia in a smoker.  She has already been on Rocephin + Zithromax to cover for CAP, 5 days of Prednisone 60 mg/ day and is using DuoNeb. I will get a CXR today and f/u results tomorrow.  Extend steroids 40-20-10 for 9 more days.  Avoid smoking.  Add Symbicort 160 two times a day, sample given.  Continue DuoNeb 4 x a day.  Change nighttime cough med to Tussionex and use  Mucinex during day.  If worsens, go to the ED, o/w f/u here on Tuesday to recheck.  PFs in yellow zone. Orders: T-DG Chest 2 View ([id number]) Peak Flow Rate (94150)  Complete Medication List: 1)  Alprazolam 1 Mg Tabs (Alprazolam) .[name].. 1 tab by mouth two times a day as needed anxiety 2)  Gabapentin 600 Mg Tabs (Gabapentin) .[name].. 1 tab by mouth tid 3)  Metformin Hcl 850 Mg Tabs (Metformin hcl) .[name].. 1 tab by mouth bid 4)  Levothyroxine Sodium 125 Mcg Tabs (Levothyroxine sodium) .[name].. 1 tab by mouth daily 5)  Ventolin Hfa 108 (90 Base) Mcg/act Aers (Albuterol sulfate) .... 2 puffs q 4 hrs prn 6)  Oxycodone Hcl 10 Mg Tabs (Oxycodone hcl) .[name].. 1 tab by mouth q 6 hrs as needed severe pain 7)  Flonase 50 Mcg/act Susp (Fluticasone propionate) .... 2 sprays/ nostril daily 8)  Effexor Xr 75 Mg Xr24h-cap (Venlafaxine hcl) .... Take 1 cap by mouth once daily 9)  Crestor 10 Mg Tabs (Rosuvastatin calcium) .[name].. 1 tab by mouth qhs 10)  Omeprazole 40 Mg Cpdr (Omeprazole) .[name].. 1 tab by mouth daily, 30 min before breakfast 11)  Duoneb 0.5-2.5 (3) Mg/79ml Soln (Ipratropium-albuterol) .... 3 ml neb 4 x a day 12)  Symbicort 160-4.5 Mcg/act Aero (Budesonide-formoterol fumarate) .[name].. 1 puff bid 13)  Tussionex Pennkinetic Er 8-10 Mg/55ml Lqcr (Chlorpheniramine-hydrocodone) .... 5 ml by mouth at bedtime as needed cough 14)  Prednisone 20 Mg Tabs (Prednisone) .... 2 tab by mouth once a day x 3 days then 1 tab by mouth once a day x 3 days then 1/2 tab by mouth once a day x 3 days  Patient Instructions: 1)  Take more prednisone taper. 2)  Use symbicort 1 puff two times a day.  Rinse mouth after use. 3)  Continue DuoNeb 4 x a day. 4)  CXR today. 5)  Will call you w/ results tomorrow. 6)  Return for recheck on Tuesday. Prescriptions: PREDNISONE 20 MG TABS (PREDNISONE) 2 tab by mouth once a day x 3 days then 1 tab by mouth once a day x 3 days then 1/2 tab by mouth once a day x 3 days  #11 tabs x 0   Entered and Authorized  by:   Seymour Bars DO   Signed by:   Seymour Bars DO on 10/29/2009   Method used:   Printed then faxed to ...       [facility] [phone]* (retail)       [address]       [place], [place]  [address]  Ph: [phone]       Fax: [phone]   RxID:   [id number] TUSSIONEX PENNKINETIC ER 8-10 MG/5ML LQCR (CHLORPHENIRAMINE-HYDROCODONE) 5 ml by mouth at bedtime as needed cough  #120 ml x 0   Entered and Authorized by:   Seymour Bars DO   Signed by:   Seymour Bars DO on 10/29/2009   Method used:   Printed then faxed to ...       [facility] [phone]* (retail)       [address] [place], [place]  [address]       Ph: [phone]       Fax: [phone]   RxID:   [id number] ALPRAZOLAM 1 MG  TABS (ALPRAZOLAM) 1 tab by mouth two times a day as needed anxiety  #60 x 0   Entered and Authorized by:   Seymour Bars DO   Signed by:   Seymour Bars DO on 10/29/2009   Method used:   Printed then faxed to ...       [facility] [phone]* (retail)       [address] [place], [place]  [address]       Ph: [phone]       Fax: [phone]   RxID:   [id number]

## 2010-06-30 NOTE — Progress Notes (Signed)
Summary: xanax refill  Phone Note Call from Patient Call back at Home Phone [phone]   Caller: Patient Call For: Melissa Bars DO Summary of Call: Pt calls and wants to know if you would fill her Xanax today because they are going out of town tomorrow for the long weekend and to also tell you her rash seems to be getting better Initial call taken by: Kathlene November,  January 29, 2010 3:46 PM    Prescriptions: ALPRAZOLAM 1 MG  TABS (ALPRAZOLAM) 1 tab by mouth two times a day as needed anxiety  #60 x 0   Entered and Authorized by:   Melissa Bars DO   Signed by:   Melissa Bars DO on 01/29/2010   Method used:   Telephoned to ...       [facility] [phone]* (retail)       [address] [place], [place]  [address]       Ph: [phone]       Fax: [phone]   RxID:   [id number]   Appended Document: xanax refill 01/29/2010 @ 4:37pm- Pt notified med called to pharmacy. KJ LPN

## 2010-06-30 NOTE — Progress Notes (Signed)
Summary: Reclast 01/22/10 @ 10am  Phone Note Outgoing Call   Summary of Call: RECLAST scheduled for tomorrow at 10am. Pt aware. Order and labs faxed to [facility] hosp.  Initial call taken by: Payton Spark CMA,  January 21, 2010 11:52 AM

## 2010-06-30 NOTE — Assessment & Plan Note (Signed)
Summary: rash   Vital Signs:  Patient profile:   60 year old female Height:      64.5 inches Weight:      134 pounds BMI:     22.73 O2 Sat:      96 % on Room air Pulse rate:   76 / minute BP sitting:   162 / 77  (left arm) Cuff size:   regular  Vitals Entered By: Payton Spark CMA (January 19, 2010 1:29 PM)  O2 Flow:  Room air CC: F/U rash.   Primary Care Provider:  Seymour Bars DO  CC:  F/U rash..  History of Present Illness: The patient says that her rash has not improved much since her last visit and it is showing up in new places they were not before including her neck, knee and thighs.  She reports that she still has the rash on her face and hands and they have in fact gotten worse after stopping the predisone and that the rash is still quite itchy.  Today, there is a diffuse rash over the right and left cheeks and around the right eye that is no non-raised.  In addition, she claims that her eyes are still itchy.  She denies fevers, headaches, dizziness, nausea, vomitting, diarrhea, shortness of breath and wheezes.    She claims to be smoking less than 1 ppd and down to drinking one beer per night.    v Current Medications (verified): 1)  Alprazolam 1 Mg  Tabs (Alprazolam) .[name].. 1 Tab By Mouth Two Times A Day As Needed Anxiety 2)  Gabapentin 600 Mg Tabs (Gabapentin) .[name].. 1 Tab By Mouth Tid 3)  Metformin Hcl 850 Mg Tabs (Metformin Hcl) .[name].. 1 Tab By Mouth Bid 4)  Levothyroxine Sodium 125 Mcg Tabs (Levothyroxine Sodium) .[name].. 1 Tab By Mouth Daily 5)  Ventolin Hfa 108 (90 Base) Mcg/act Aers (Albuterol Sulfate) .... 2 Puffs Q 4 Hrs Prn 6)  Oxycodone Hcl 10 Mg Tabs (Oxycodone Hcl) .[name].. 1 Tab By Mouth Q 6 Hrs As Needed Severe Pain 7)  Flonase 50 Mcg/act Susp (Fluticasone Propionate) .... 2 Sprays/ Nostril Daily 8)  Effexor Xr 150 Mg Xr24h-Cap (Venlafaxine Hcl) .[name].. 1 Capsule By Mouth Once A Day 9)  Omeprazole 40 Mg Cpdr (Omeprazole) .[name].. 1 Tab By Mouth Daily, 30 Min Before  Breakfast 10)  Duoneb 0.5-2.5 (3) Mg/52ml Soln (Ipratropium-Albuterol) .... 3 Ml Neb 4 X A Day 11)  Symbicort 160-4.5 Mcg/act Aero (Budesonide-Formoterol Fumarate) .[name].. 1 Puff Bid 12)  Reclast 5 Mg/193ml Soln (Zoledronic Acid) .... Iv Infusion Over 15 Minutes  Allergies (verified): 1)  ! Codeine 2)  ! * Ultracet  Past History:  Past Medical History: Reviewed history from 07/03/2009 and no changes required. 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 GB breast lump, benign knee R and L hand 2007 R knee TKR 2010  Social History: Reviewed history from 06/05/2007 and no changes required. 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 Has 2 sons - 69 & 30. Smokes 1 ppd x 43 yrs. 5 ETOH/ wk. Denies drug use. does not work out  Review of Systems  The patient denies anorexia, fever, weight loss, vision loss, prolonged cough, and abdominal pain.    Physical Exam  General:  alert, well-developed, and well-nourished.  alert, well-developed, and well-nourished.  Head:  normocephalic and atraumatic.   Eyes:  conjunctiva clear, mild upper lid swelling with erythematous rash Nose:  no nasal discharge.   Mouth:  no lesions of the oral mucosa or mucosal peeling Neck:  no masses.   Lungs:  normal respiratory effort, normal breath sounds, no crackles, and no wheezes.  normal respiratory effort, normal breath sounds, no crackles, and no wheezes.   Heart:  normal rate, regular rhythm, no murmur, no gallop, and no rub.  normal rate, regular rhythm, no murmur, no gallop, and no rub.   Abdomen:  soft, non-tender, and normal bowel sounds.  soft, non-tender, and normal bowel sounds.   Skin:  flat scaley papular rash scattered on the face, trunk and extermities Cervical Nodes:  No  lymphadenopathy noted Psych:  normally interactive and good eye contact.  normally interactive and good eye contact.     Impression & Recommendations:  Problem # 1:  ALLERGIC REACTION (ICD-995.3) She appears to have recurring allergic dermatitis, this could be from her new start of Symbicort.  Will hold this and replace with Advair which she has at home.  Checking labs today for f/u and will add on a CBC with diff to check her eosinophils.  Will give Depo Medrol IM today.  Call if rash not resolved in 72 hrs.  May need referral to derm or allergies.   Orders: T-CBC w/Diff ([id number]) Depo- Medrol 80mg  (J1040)  Complete Medication List: 1)  Alprazolam 1 Mg Tabs (Alprazolam) .[name].. 1 tab by mouth two times a day as needed anxiety 2)  Gabapentin 600 Mg Tabs (Gabapentin) .[name].. 1 tab by mouth tid 3)  Metformin Hcl 850 Mg Tabs (Metformin hcl) .[name].. 1 tab by mouth bid 4)  Levothyroxine Sodium 125 Mcg Tabs (Levothyroxine sodium) .[name].. 1 tab by mouth daily 5)  Ventolin Hfa 108 (90 Base) Mcg/act Aers (Albuterol sulfate) .... 2 puffs q 4 hrs prn 6)  Oxycodone Hcl 10 Mg Tabs (Oxycodone hcl) .[name].. 1 tab by mouth q 6 hrs as needed severe pain 7)  Flonase 50 Mcg/act Susp (Fluticasone propionate) .... 2 sprays/ nostril daily 8)  Effexor Xr 150 Mg Xr24h-cap (Venlafaxine hcl) .[name].. 1 capsule by mouth once a day 9)  Omeprazole 40 Mg Cpdr (Omeprazole) .[name].. 1 tab by mouth daily, 30 min before breakfast 10)  Duoneb 0.5-2.5 (3) Mg/52ml Soln (Ipratropium-albuterol) .... 3 ml neb 4 x a day 11)  Symbicort 160-4.5 Mcg/act Aero (Budesonide-formoterol fumarate) .[name].. 1 puff bid 12)  Reclast 5 Mg/174ml Soln (Zoledronic acid) .... Iv infusion over 15 minutes  Other Orders: T-Liver Profile [id number]) T-Gamma GT (GGT) [id number]) T-Protime, Auto [id number]) T-Basic Metabolic Panel [id number]) T-Calcium  [id number]) Admin of Therapeutic Inj  intramuscular or subcutaneous (03474)  Patient Instructions: 1)   Replace Symbicort with Advair - 1 puff two times a day, rinse mouth after each use. 2)  Steroid shot today. 3)  Call if rash not improved in 3 days. 4)  Update labs. 5)  Will call you w/ results.   Medication Administration  Injection # 1:    Medication: Depo- Medrol 80mg     Diagnosis: ALLERGIC REACTION (ICD-995.3)    Route: IM    Site: RUOQ gluteus    Exp Date: 07/2010    Lot #: [id number]    Patient tolerated injection without complications    Given by: Payton Spark CMA (January 19, 2010 3:29 PM)  Orders Added: 1)  T-Liver Profile [id number] 2)  T-Gamma GT (GGT) [[id number]] 3)  T-Protime, Auto [[id number]]  4)  T-CBC w/Diff [[id number]] 5)  T-Basic Metabolic Panel [[id number]] 6)  T-Calcium  [[id number]] 7)  Depo- Medrol 80mg  [J1040] 8)  Admin of Therapeutic Inj  intramuscular or subcutaneous [96372] 9)  Est. Patient Level III [62376]

## 2010-06-30 NOTE — Assessment & Plan Note (Signed)
Summary: sinusitis   Vital Signs:  Patient profile:   60 year old female Height:      64.5 inches Weight:      129 pounds BMI:     21.88 O2 Sat:      97 % on Room air Temp:     98.4 degrees F oral Pulse rate:   110 / minute BP sitting:   142 / 84  (left arm) Cuff size:   large  Vitals Entered By: Payton Spark CMA (April 10, 2010 10:08 AM)  O2 Flow:  Room air CC: C/O sinus infection and HA x 1 week. Also c/o body pain   Primary Care Brandy Zuba:  Seymour Bars DO  CC:  C/O sinus infection and HA x 1 week. Also c/o body pain.  History of Present Illness: 60 yo WF presents for severe HA across her forehead and behind her eyes x 1 wk.  She denies much nasal congestion but she is taking sudafed and mucinex already.  She has not had fevers or chills.  She has not had a cough or much rhinorrhea.  She gets 2-3 sinus infections per year.   She has diffuse osteoarthritis. She sees Dr Myra Rude as her orthopedist. She sees Dr Oneal Grout and had an LESI for back pain but she is not sure if it could have been an SI injection.       Allergies: 1)  ! Codeine 2)  ! * Ultracet 3)  ! Symbicort (Budesonide-Formoterol Fumarate) 4)  ! Advair Hfa (Fluticasone-Salmeterol)  Past History:  Past Medical History: Reviewed history from 07/03/2009 and no changes required. 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 GB breast lump, benign knee R and L hand 2007 R knee TKR 2010  Social History: Reviewed history from 06/05/2007 and no changes required. 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 Has 2 sons - 52 & 30. Smokes 1 ppd x 43 yrs. 5 ETOH/ wk. Denies drug use. does not work out  Review of Systems      See HPI  Physical Exam  General:  alert, well-developed,  well-nourished, and well-hydrated.   Head:  normocephalic and atraumatic.  frontal and maxillary sinus TTP Eyes:  conjunctiva clear; slightly watery Ears:  EACs patent; TMs translucent and gray with good cone of light and bony landmarks.  Nose:  nasal congestion present Mouth:  pharynx pink and moist.  cobblestoning Neck:  no masses.   Lungs:  normal respiratory effort, no intercostal retractions, no accessory muscle use, normal breath sounds, no crackles, and no wheezes.   Heart:  Normal rate and regular rhythm. S1 and S2 normal without gallop, murmur, click, rub or other extra sounds. Skin:  color normal.   Cervical Nodes:  shotty, tender anterior cervical chain LA   Impression & Recommendations:  Problem # 1:  ACUTE MAXILLARY SINUSITIS (ICD-461.0) Will treat with meds below.   Her updated medication list for this problem includes:    Flonase 50 Mcg/act Susp (Fluticasone propionate) .[name]... 2 sprays/ nostril daily    Amoxicillin 500 Mg Caps (Amoxicillin) .[name]... 1 capsule by mouth three times a day x 10 days  Problem # 2:  CHRONIC PAIN SYNDROME (ICD-338.4) Chronic arthritis pain.  I recommend that she see Dr Ciro Backer back for her SI pain for injection and Dr Oneal Grout is  already managing her chronic pain meds.  Complete Medication List: 1)  Alprazolam 1 Mg Tabs (Alprazolam) .[name].. 1 tab by mouth two times a day as needed anxiety 2)  Gabapentin 600 Mg Tabs (Gabapentin) .[name].. 1 tab by mouth tid 3)  Metformin Hcl 850 Mg Tabs (Metformin hcl) .[name].. 1 tab by mouth bid 4)  Levothyroxine Sodium 125 Mcg Tabs (Levothyroxine sodium) .[name].. 1 tab by mouth daily 5)  Ventolin Hfa 108 (90 Base) Mcg/act Aers (Albuterol sulfate) .... 2 puffs q 4 hrs prn 6)  Oxycodone Hcl 10 Mg Tabs (Oxycodone hcl) .[name].. 1 tab by mouth q 6 hrs as needed severe pain 7)  Flonase 50 Mcg/act Susp (Fluticasone propionate) .... 2 sprays/ nostril daily 8)  Effexor Xr 150 Mg Xr24h-cap (Venlafaxine hcl) .[name].. 1 capsule by mouth once a  day 9)  Omeprazole 40 Mg Cpdr (Omeprazole) .[name].. 1 tab by mouth daily, 30 min before breakfast 10)  Albuterol Sulfate (5 Mg/ml) 0.5% Nebu (Albuterol sulfate) .... 5 mg neb q 4-6 hrs prn 11)  Reclast 5 Mg/145ml Soln (Zoledronic acid) .... Iv infusion over 15 minutes 12)  Claritin 10 Mg Tabs (Loratadine) .[name].. 1 tab by mouth daily (otc) 13)  Spiriva Handihaler 18 Mcg Caps (Tiotropium bromide monohydrate) .[name].. 1 capsule inhalation daily 14)  Lunesta 3 Mg Tabs (Eszopiclone) .[name].. 1 tab by mouth at bedtime as needed sleep 15)  Amoxicillin 500 Mg Caps (Amoxicillin) .[name].. 1 capsule by mouth three times a day x 10 days  Patient Instructions: 1)  Trial of Lunesta - take 3 mg tab at bedtime as needed for sleep.  If this works well for you, call back for RX. 2)  Take 10 days of Amoxicillin for sinus infection + plain Zyrtec in the evenings.  Use Ibuprofen as needed for aches and pains. 3)  Set up appt with Dr Netta Corrigan for SI injection. 4)  F/U with Dr Oneal Grout for pain managment. Prescriptions: AMOXICILLIN 500 MG CAPS (AMOXICILLIN) 1 capsule by mouth three times a day x 10 days  #30 x 0   Entered and Authorized by:   Seymour Bars DO   Signed by:   Seymour Bars DO on 04/10/2010   Method used:   Electronically to        [facility] [phone]* (retail)       [address] [place], [place]  [address]       Ph: [phone]       Fax: [phone]   RxID:   [id number]    Orders Added: 1)  Est. Patient Level III [24401]

## 2010-07-02 NOTE — Progress Notes (Signed)
Summary: Call back Patient   Phone Note Call from Patient Call back at Home Phone [phone]   Caller: Patient Reason for Call: Talk to Nurse Details for Reason: Wants to talk to CMA, Payton Spark Summary of Call: Patient left a message asking that [facility] call her back at [phone]... Thanks.[name]  May 19, 2010 11:15 AM  Initial call taken by: [name],  May 19, 2010 11:15 AM  Follow-up for Phone Call        Pt just needed Dr. Cyndia Diver #.  Follow-up by: Payton Spark CMA,  May 19, 2010 1:32 PM

## 2010-07-02 NOTE — Progress Notes (Signed)
Summary: Meds  Phone Note Call from Patient   Summary of Call: Recieved letter from pt about RFs she needed.  RX [employer].  Usually pricey. RX Levothyroxine printed.  Due for TSH in FEB. Generic Effexor XR (Venlaxafine XR) RFd -- but she's on the 150 mg dose, not the 75mg  and it is not cheaper for #60 of the 75's vs #30 of the 150s.   Alprazolam (generic xanax) filled 05-29-10, not due for RFs yet. Oxycodone filled for #120 on 12-14 so is NOT due for a RF of this.   Initial call taken by: Seymour Bars DO,  June 02, 2010 9:50 AM    New/Updated Medications: LEVOTHYROXINE SODIUM 125 MCG TABS (LEVOTHYROXINE SODIUM) 1 tab by mouth daily VENLAFAXINE HCL 150 MG XR24H-CAP (VENLAFAXINE HCL) 1 capsule by mouth once daily Prescriptions: VENLAFAXINE HCL 150 MG XR24H-CAP (VENLAFAXINE HCL) 1 capsule by mouth once daily  #30 x 3   Entered and Authorized by:   Seymour Bars DO   Signed by:   Seymour Bars DO on 06/02/2010   Method used:   Printed then faxed to ...       [facility] [phone]* (retail)       [address] [place], [place]  [address]       Ph: [phone]       Fax: [phone]   RxID:   [id number] LEVOTHYROXINE SODIUM 125 MCG TABS (LEVOTHYROXINE SODIUM) 1 tab by mouth daily  #90 x 0   Entered and Authorized by:   Seymour Bars DO   Signed by:   Seymour Bars DO on 06/02/2010   Method used:   Printed then faxed to ...       [facility] [phone]* (retail)       [address] [place], [place]  [address]       Ph: [phone]       Fax: [phone]   RxID:   [id number] LEVOTHYROXINE SODIUM 125 MCG TABS (LEVOTHYROXINE SODIUM) 1 tab by mouth daily  #30 x 1   Entered and Authorized by:   Seymour Bars DO   Signed by:   Seymour Bars DO on 06/02/2010   Method used:   Printed then faxed to ...       [facility] [phone]* (retail)       [address] [place], [place]  [address]       Ph: [phone]       Fax: [phone]   RxID:    [id number] LUNESTA 3 MG TABS (ESZOPICLONE) 1 tab by mouth at bedtime as needed sleep  #24 x 2   Entered and Authorized by:   Seymour Bars DO   Signed by:   Seymour Bars DO on 06/02/2010   Method used:   Printed then faxed to ...       [facility] [phone]* (retail)       [address] [place], [place]  [address]       Ph: [phone]       Fax: [phone]   RxID:   [id number]   Appended Document: Meds Pt aware of the above

## 2010-07-02 NOTE — Progress Notes (Signed)
Summary: Early Xanax refill  Phone Note Call from Patient   Caller: Patient Summary of Call: Pt [facility] stating she had surgery last week and misplaced Xanax Rx while going from hosp to home. Pt would like to know if you will refill early. Pt states she understands if you have to give less quantity due to early fill. Please advise. Initial call taken by: Payton Spark CMA,  June 23, 2010 11:10 AM    Prescriptions: ALPRAZOLAM 1 MG  TABS (ALPRAZOLAM) 1 tab by mouth two times a day as needed anxiety  #60 x 0   Entered and Authorized by:   Seymour Bars DO   Signed by:   Seymour Bars DO on 06/23/2010   Method used:   Printed then faxed to ...       [facility] [phone]* (retail)       [address] [place], [place]  [address]       Ph: [phone]       Fax: [phone]   RxID:   [id number]   Appended Document: Early Xanax refill Faxed

## 2010-07-02 NOTE — Assessment & Plan Note (Signed)
Summary: f/u DM/ pain   Vital Signs:  Patient profile:   60 year old female Height:      64.5 inches Weight:      133 pounds BMI:     22.56 O2 Sat:      98 % on Room air Pulse rate:   98 / minute BP sitting:   150 / 79  (left arm) Cuff size:   large  Vitals Entered By: Payton Spark CMA (May 13, 2010 9:13 AM)  O2 Flow:  Room air CC: F/U DM   Primary Care Provider:  Seymour Bars DO  CC:  F/U DM.  History of Present Illness: 60 yo WF presents for f/u DM.  She sees Dr Oneal Grout for chronic pai (neck, back, knees from severe arthritis).  At her last visit here, she was trying to get ahold of Dr Oneal Grout to let her know that her last steroid injection didn't help but she could not get through to her office.  She was having to take 1.5 tabs of her oxycodone.  When she went back to see her, her RX was changed to Fentanyl 25 micrograms patch q 72 hrs but she became very hostile as side effect.  She was not sleeping and had extreme personality change and has to stop it.  She put on her last patch yesterday but took it off after 2 hrs.  She wants to find a different pain clinic.  Her mood is back to normal today but her pain level is high.   Her sugars are running 120s fasting AM.     Current Medications (verified): 1)  Alprazolam 1 Mg  Tabs (Alprazolam) .[name].. 1 Tab By Mouth Two Times A Day As Needed Anxiety 2)  Gabapentin 600 Mg Tabs (Gabapentin) .[name].. 1 Tab By Mouth Tid 3)  Metformin Hcl 850 Mg Tabs (Metformin Hcl) .[name].. 1 Tab By Mouth Bid 4)  Levothyroxine Sodium 125 Mcg Tabs (Levothyroxine Sodium) .[name].. 1 Tab By Mouth Daily 5)  Ventolin Hfa 108 (90 Base) Mcg/act Aers (Albuterol Sulfate) .... 2 Puffs Q 4 Hrs Prn 6)  Oxycodone Hcl 15 Mg Tabs (Oxycodone Hcl) .... Take 1 Tab By Mouth Every 6 Hours As Needed 7)  Flonase 50 Mcg/act Susp (Fluticasone Propionate) .... 2 Sprays/ Nostril Daily 8)  Effexor Xr 150 Mg Xr24h-Cap (Venlafaxine Hcl) .[name].. 1 Capsule By Mouth Once A Day 9)  Omeprazole  40 Mg Cpdr (Omeprazole) .[name].. 1 Tab By Mouth Daily, 30 Min Before Breakfast 10)  Reclast 5 Mg/11ml Soln (Zoledronic Acid) .... Iv Infusion Over 15 Minutes 11)  Claritin 10 Mg Tabs (Loratadine) .[name].. 1 Tab By Mouth Daily (Otc) 12)  Spiriva Handihaler 18 Mcg Caps (Tiotropium Bromide Monohydrate) .[name].. 1 Capsule Inhalation Daily 13)  Lunesta 3 Mg Tabs (Eszopiclone) .[name].. 1 Tab By Mouth At Bedtime As Needed Sleep  Allergies (verified): 1)  ! Codeine 2)  ! * Ultracet 3)  ! Symbicort (Budesonide-Formoterol Fumarate) 4)  ! Advair Hfa (Fluticasone-Salmeterol) 5)  ! * Fentanyl  Past History:  Past Medical History: Reviewed history from 07/03/2009 and no changes required. 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 GB breast lump, benign knee R and L hand 2007 R knee TKR 2010  Social History: Reviewed history from 06/05/2007 and no changes required. 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 Has 2 sons - 70 & 30. Smokes 1 ppd x 43 yrs. 5 ETOH/ wk. Denies drug use. does not work out  Review of Systems      See HPI  Physical Exam  General:  alert, well-developed, well-nourished, and well-hydrated.   Head:  normocephalic and atraumatic.   Eyes:  sclera non icteric Mouth:  pharynx pink and moist.   Neck:  no masses.   Lungs:  CTA with non labored breathing but has prolonged expiration Heart:  normal rate, regular rhythm, and no murmur.   Msk:  moderate synovitis in PIP and DIP joints Extremities:  no LE edema Skin:  color normal.   Psych:  good eye contact, not depressed appearing, and slightly anxious.    Diabetes Management Exam:    Foot Exam (with socks and/or shoes not present):       Sensory-Pinprick/Light touch:          Left medial foot (L-4): normal          Left  dorsal foot (L-5): normal          Left lateral foot (S-1): normal          Right medial foot (L-4): normal          Right dorsal foot (L-5): normal          Right lateral foot (S-1): normal       Sensory-Monofilament:          Left foot: normal          Right foot: normal       Inspection:          Left foot: normal          Right foot: normal       Nails:          Left foot: normal          Right foot: normal   Impression & Recommendations:  Problem # 1:  CHRONIC PAIN SYNDROME (ICD-338.4) Assessment Deteriorated  No longer seeing Dr Oneal Grout.  Had extreme reaction to Fentanyl patch and is off x 24 hrs now. Will get her on Oxycodone 20 mg q 6 hrs #120/ 30 days-- was taking 1.5 tabs of 15 mg in the past. I will get her back in with a new pain clinic to follow.  Orders: Pain Clinic Referral (Pain)  Problem # 2:  DM (ICD-250.00) A1C looks excellent at 5.8.  Cotninue current meds.  Can get flu shot at the pharmacy. Unable to give urine micro today.  Monofilament normal today.  Eye exam is UTD.  Her updated medication list for this problem includes:    Metformin Hcl 850 Mg Tabs (Metformin hcl) .[name]... 1 tab by mouth bid  Orders: Fingerstick (36416) Hemoglobin A1C (83036)  Labs Reviewed: Creat: 0.70 (01/21/2010)   Microalbumin: 10 (12/20/2008)  Last Eye Exam: no retinopathy (Dr Clearance Coots) (06/23/2009) Reviewed HgBA1c results: 5.8 (05/13/2010)  6.1 (12/04/2009)  Problem # 3:  ELEVATED BLOOD PRESSURE WITHOUT DIAGNOSIS OF HYPERTENSION (ICD-796.2) Assessment: New BP high today, likely due to severe pain. Will recheck at a nurse visit in 2 wks since she if OUT of pain meds today.  Problem # 4:  COPD (ICD-496) Spiriva RFd.  Recommend getting flu shot at the pharmacy ASAP since we are out.   The following medications were removed from the medication list:    Albuterol Sulfate (5 Mg/ml) 0.5% Nebu (Albuterol sulfate) .Marland KitchenMarland KitchenMarland KitchenMarland Kitchen 5 mg neb q 4-6 hrs prn Her  updated medication list for this  problem includes:    Ventolin Hfa 108 (90 Base) Mcg/act Aers (Albuterol sulfate) .[name]... 2 puffs q 4 hrs prn    Spiriva Handihaler 18 Mcg Caps (Tiotropium bromide monohydrate) .[name]... 1 capsule inhalation daily  Complete Medication List: 1)  Alprazolam 1 Mg Tabs (Alprazolam) .[name].. 1 tab by mouth two times a day as needed anxiety 2)  Gabapentin 600 Mg Tabs (Gabapentin) .[name].. 1 tab by mouth tid 3)  Metformin Hcl 850 Mg Tabs (Metformin hcl) .[name].. 1 tab by mouth bid 4)  Levothyroxine Sodium 125 Mcg Tabs (Levothyroxine sodium) .[name].. 1 tab by mouth daily 5)  Ventolin Hfa 108 (90 Base) Mcg/act Aers (Albuterol sulfate) .... 2 puffs q 4 hrs prn 6)  Oxycodone Hcl 20 Mg Tabs (Oxycodone hcl) .[name].. 1 tab by mouth q 6 hrs as needed severe pain 7)  Flonase 50 Mcg/act Susp (Fluticasone propionate) .... 2 sprays/ nostril daily 8)  Effexor Xr 150 Mg Xr24h-cap (Venlafaxine hcl) .[name].. 1 capsule by mouth once a day 9)  Omeprazole 40 Mg Cpdr (Omeprazole) .[name].. 1 tab by mouth daily, 30 min before breakfast 10)  Reclast 5 Mg/161ml Soln (Zoledronic acid) .... Iv infusion over 15 minutes 11)  Claritin 10 Mg Tabs (Loratadine) .[name].. 1 tab by mouth daily (otc) 12)  Spiriva Handihaler 18 Mcg Caps (Tiotropium bromide monohydrate) .[name].. 1 capsule inhalation daily 13)  Lunesta 3 Mg Tabs (Eszopiclone) .[name].. 1 tab by mouth at bedtime as needed sleep  Patient Instructions: 1)  A1C looks great at 5.8. 2)  RX for Oxycodone 20 mg q 6 hrs #120 to last 30 days. 3)  Will work on referral to Dr Jordan Likes. 4)  Stay on current meds. 5)  Return for a nurse BP check in 2 wks. 6)  Return for follow up visit in 4 mos. Prescriptions: SPIRIVA HANDIHALER 18 MCG CAPS (TIOTROPIUM BROMIDE MONOHYDRATE) 1 capsule inhalation daily  #1 month x 12   Entered and Authorized by:   Seymour Bars DO   Signed by:   Seymour Bars DO on 05/13/2010   Method used:   Electronically to        [facility] [phone]* (retail)       [address]       [place], [place]   [address]       Ph: [phone]       Fax: [phone]   RxID:   [id number] OXYCODONE HCL 20 MG TABS (OXYCODONE HCL) 1 tab by mouth q 6 hrs as needed severe pain  #120 x 0   Entered and Authorized by:   Seymour Bars DO   Signed by:   Seymour Bars DO on 05/13/2010   Method used:   Print then Give to Patient   RxID:   [id number]    Orders Added: 1)  Fingerstick [36416] 2)  Hemoglobin A1C [83036] 3)  Est. Patient Level IV [34742] 4)  Pain Clinic Referral [Pain]    Laboratory Results   Blood Tests     HGBA1C: 5.8%   (Normal Range: Non-Diabetic - 3-6%   Control Diabetic - 6-8%)

## 2010-07-02 NOTE — Progress Notes (Signed)
Summary: NEED INCREASE IN MED DOSAGE  Phone Note Call from Patient   Caller: Patient Summary of Call: PT CALLED REQ TO KNOW IF DR. Cathey Endow CAN INCREASE DOSAGE FROM 20MG  TO 30MG  4 TIMES A DAY ITS NOT WORKING HAVING A LESSER DOSAGE OXYCODONE. HAVING SURGERY A WEEK FROM  [facility] NEED A 2WK INCREASE DOSAGE OF OXYCODONE 30MG  [phone] Initial call taken by: Janeal Holmes,  June 08, 2010 3:27 PM  Follow-up for Phone Call        I am not comfortable going up on her Oxycodone and she should be set up with another pain clinic as I do not write for chronic narcotics like this.  I will add an RX NSAID for her to alternate with her Oxycodone for pain relief. Follow-up by: Seymour Bars DO,  June 09, 2010 11:44 AM    New/Updated Medications: ETODOLAC 400 MG TABS (ETODOLAC) 1 tab by mouth two times a day with food for pain Prescriptions: ETODOLAC 400 MG TABS (ETODOLAC) 1 tab by mouth two times a day with food for pain  #60 x 1   Entered and Authorized by:   Seymour Bars DO   Signed by:   Seymour Bars DO on 06/09/2010   Method used:   Electronically to        [facility] [phone]* (retail)       [address]       [place], [place]  [address]       Ph: [phone]       Fax: [phone]   RxID:   [id number]

## 2010-07-02 NOTE — Assessment & Plan Note (Signed)
Summary: back pain/ meds   Vital Signs:  Patient profile:   60 year old female Height:      64.5 inches Weight:      133 pounds BMI:     22.56 O2 Sat:      100 % on Room air Pulse rate:   99 / minute BP sitting:   158 / 85  (left arm) Cuff size:   large  Vitals Entered By: Payton Spark CMA (June 10, 2010 10:02 AM)  O2 Flow:  Room air CC: Discuss pain meds, effexor and cream for rash.    Primary Care Provider:  Seymour Bars DO  CC:  Discuss pain meds and effexor and cream for rash. .  History of Present Illness: 60 yo WF presents for pain.  She is going back to the OR next wk for Lower back surgery for a herniated disc.  Dr Netta Corrigan is doing her surgery.    She is set up to see Dr Jordan Likes (changing from Dr Oneal Grout).  She is set up to see him about 3 wks after surgery.  I called her in Etodolac but she has not started it yet  because she is scheduled for surgery next wk.  She has been taking Oxycodone 20 mg 4 x a day but it does not seem to be controlling her pain well.  She has pain with walking, standing, sitting and resting.  She was previously on 30 mg 4 x  day wtih Dr Oneal Grout but I cut her back when I started prescribing for her.     Current Medications (verified): 1)  Alprazolam 1 Mg  Tabs (Alprazolam) .[name].. 1 Tab By Mouth Two Times A Day As Needed Anxiety 2)  Gabapentin 600 Mg Tabs (Gabapentin) .[name].. 1 Tab By Mouth Tid 3)  Metformin Hcl 850 Mg Tabs (Metformin Hcl) .[name].. 1 Tab By Mouth Bid 4)  Levothyroxine Sodium 125 Mcg Tabs (Levothyroxine Sodium) .[name].. 1 Tab By Mouth Daily 5)  Ventolin Hfa 108 (90 Base) Mcg/act Aers (Albuterol Sulfate) .... 2 Puffs Q 4 Hrs Prn 6)  Oxycodone Hcl 20 Mg Tabs (Oxycodone Hcl) .[name].. 1 Tab By Mouth Q 6 Hrs As Needed Severe Pain 7)  Flonase 50 Mcg/act Susp (Fluticasone Propionate) .... 2 Sprays/ Nostril Daily 8)  Venlafaxine Hcl 150 Mg Xr24h-Cap (Venlafaxine Hcl) .[name].. 1 Capsule By Mouth Once Daily 9)  Omeprazole 40 Mg Cpdr (Omeprazole) .[name].. 1 Tab  By Mouth Daily, 30 Min Before Breakfast 10)  Claritin 10 Mg Tabs (Loratadine) .[name].. 1 Tab By Mouth Daily (Otc) 11)  Spiriva Handihaler 18 Mcg Caps (Tiotropium Bromide Monohydrate) .[name].. 1 Capsule Inhalation Daily 12)  Lunesta 3 Mg Tabs (Eszopiclone) .[name].. 1 Tab By Mouth At Bedtime As Needed Sleep 13)  Etodolac 400 Mg Tabs (Etodolac) .[name].. 1 Tab By Mouth Two Times A Day With Food For Pain  Allergies (verified): 1)  ! Codeine 2)  ! * Ultracet 3)  ! Symbicort (Budesonide-Formoterol Fumarate) 4)  ! Advair Hfa (Fluticasone-Salmeterol) 5)  ! * Fentanyl  Past History:  Past Medical History: OA cervicalgia chronic pain syndrome (Dr Oneal Grout); was on Methadone Hx of narcotic Abuse anxiety DM , 2002 ([facility]) hypothryroid high chol COPD osteoporosis fatty liver dz lumbar DDD  Past Surgical History: GB breast lump, benign knee R and L hand 2007 R knee TKR 2010 lumbar laminectomy - Dr Netta Corrigan (05-2010)  Social History: Reviewed history from 06/05/2007 and no changes required. 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 Has 2 sons -  18 & 30. Smokes 1 ppd x 43 yrs. 5 ETOH/ wk. Denies drug use. does not work out  Review of Systems      See HPI  Physical Exam  General:  alert, well-developed, well-nourished, and well-hydrated.   Neurologic:  antalgic gait, stooped over Psych:  good eye contact, not anxious appearing, and not depressed appearing.     Impression & Recommendations:  Problem # 1:  CHRONIC PAIN SYNDROME (ICD-338.4) Assessment Deteriorated Worsened by LBP with upcoming laminectomy next with per Dr Netta Corrigan.  She is currently between pain clinics but will see Dr Jordan Likes post op.  I will increase her Oxycodone to 30 mg 3-4 x a day for pain; #120 given today and she will then need to f/u with eithter her surgeon or Dr Jordan Likes.  She is obviously in pain today with elevated BP.    Avoid the NSAID rx previously written due to bleeding risk with upcoming surgery. Avoid APAP with  fatty liver / ETOH hx.  Complete Medication List: 1)  Alprazolam 1 Mg Tabs (Alprazolam) .[name].. 1 tab by mouth two times a day as needed anxiety 2)  Gabapentin 600 Mg Tabs (Gabapentin) .[name].. 1 tab by mouth tid 3)  Metformin Hcl 850 Mg Tabs (Metformin hcl) .[name].. 1 tab by mouth bid 4)  Levothyroxine Sodium 125 Mcg Tabs (Levothyroxine sodium) .[name].. 1 tab by mouth daily 5)  Ventolin Hfa 108 (90 Base) Mcg/act Aers (Albuterol sulfate) .... 2 puffs q 4 hrs prn 6)  Oxycodone Hcl 30 Mg Tabs (Oxycodone hcl) .[name].. 1 tab by mouth q 6-8 hrs as needed severe pain 7)  Flonase 50 Mcg/act Susp (Fluticasone propionate) .... 2 sprays/ nostril daily 8)  Citalopram Hydrobromide 20 Mg Tabs (Citalopram hydrobromide) .... 1/2 tab by mouth daily x 7 days then increase to 1 tab by mouth daily 9)  Omeprazole 40 Mg Cpdr (Omeprazole) .[name].. 1 tab by mouth daily, 30 min before breakfast 10)  Claritin 10 Mg Tabs (Loratadine) .[name].. 1 tab by mouth daily (otc) 11)  Spiriva Handihaler 18 Mcg Caps (Tiotropium bromide monohydrate) .[name].. 1 capsule inhalation daily 12)  Lunesta 3 Mg Tabs (Eszopiclone) .[name].. 1 tab by mouth at bedtime as needed sleep 13)  Mupirocin 2 % Oint (Mupirocin) .... Apply to hand rash two times a day  Patient Instructions: 1)  Start Citalopram 1/2 tab (10 mg) once daily x 7 days then increase to a full 20 mg tab once daily. 2)  Do not pick up Etodolac. 3)  Increase Oxycodone to 30 mg 3-4 x a day. 4)  Return for f/u pain/ thyroid/ mood in 6 wks. Prescriptions: MUPIROCIN 2 % OINT (MUPIROCIN) apply to hand rash two times a day  #22 g x 1   Entered and Authorized by:   Seymour Bars DO   Signed by:   Seymour Bars DO on 06/10/2010   Method used:   Electronically to        [facility] [phone]* (retail)       [address] [place], [place]  [address]       Ph: [phone]       Fax: [phone]   RxID:   [id number] CITALOPRAM HYDROBROMIDE 20 MG TABS (CITALOPRAM HYDROBROMIDE) 1/2 tab by mouth daily x 7  days then increase to 1 tab by mouth daily  #30 x 2   Entered and Authorized by:   Seymour Bars DO   Signed by:   Seymour Bars  DO on 06/10/2010   Method used:   Electronically to        [facility] [phone]* (retail)       [address] [place], [place]  [address]       Ph: [phone]       Fax: [phone]   RxID:   [id number] OXYCODONE HCL 30 MG TABS (OXYCODONE HCL) 1 tab by mouth q 6-8 hrs as needed severe pain  #120 x 0   Entered and Authorized by:   Seymour Bars DO   Signed by:   Seymour Bars DO on 06/10/2010   Method used:   Print then Give to Patient   RxID:   [id number]    Orders Added: 1)  Est. Patient Level III [347[address]]

## 2010-07-07 ENCOUNTER — Telehealth: Payer: Self-pay | Admitting: Family Medicine

## 2010-07-07 ENCOUNTER — Encounter: Payer: Self-pay | Admitting: Family Medicine

## 2010-07-07 NOTE — Discharge Summary (Addendum)
NAMEMILLISSA, [name]                 ACCOUNT NO.:  [id number]  MEDICAL RECORD NO.:  [id number]          PATIENT TYPE:  INP  LOCATION:  1522                         FACILITY:  [facility]  PHYSICIAN:  Georges Lynch. Wylie Russon, M.D.DATE OF BIRTH:  [birth year]  DATE OF ADMISSION:  06/17/2010 DATE OF DISCHARGE:  06/19/2010                              DISCHARGE SUMMARY   ADMITTING DIAGNOSES: 1. Herniated lumbar disk at L3-L4 on the left. 2. Spinal stenosis at L3-L4.  DISCHARGE DIAGNOSES:  Herniated lumbar disk at L3-L4 on the left as well as spinal stenosis at L3-L4, status post decompressive lumbar laminectomy and microdiskectomy.  LABORATORY DATA:  [name] preoperative CBC revealed a white count of 11, hemoglobin 13.3, hematocrit 39.7, and a platelet count of 298,000. Her preoperative INR was 0.96.  She was not on anticoagulant preoperatively.  Preoperative chemistry panel revealed elevated glucose at 121.  Preoperative urinalysis revealed some leukocyte esterase but no white cells and preoperative PCR for MRSA and Staph aureus were both negative.  Postoperative day 1, the patient's hemoglobin had dropped to 11.9, hematocrit 36.7.  Postoperative day 2, her hemoglobin had dropped to a low of 10.8 and hematocrit 32.4.  She did not require blood transfusion throughout her hospital stay.  PROCEDURE:  On June 17, 2010, [name] was taken to the operating room by surgeon, Dr. Ranee Gosselin, assistant Dr. Marlowe Kays.  She underwent complete decompressive lumbar laminectomy at L3-L4 for stenosis as well as microdiskectomy at L3-L4 on the left for herniated disk and recess stenosis.  She also had foraminotomy performed at L3 and L4 nerve root.  The procedure was performed under general anesthesia. Routine orthopedic prep and drape were carried out.  [name] was given 1 gram IV Ancef preoperatively.  There are no complications with the procedure and she was returned to the recovery room  in satisfactory condition.  HOSPITAL COURSE:  [name] was admitted to [facility] on June 17, 2010.  She underwent the above-stated procedure without complication.  After spending adequate time in the recovery room, she was then taken to the fifth floor for further recovery.  She was started on PCA reduced dose Dilaudid as well as muscle relaxants for pain control.  Postoperative day 1, the patient stated that her back was feeling fairly good.  Her pain was well controlled except for headache that she was experiencing.  She had 5 out of 5 strength in the lower extremities bilaterally.  On postoperative day 2, the patient seemed to be resting more comfortably.  Headache had resolved.  She had been up walking with therapy, dressing changed, and she was discharged home on postoperative day #2.  DISPOSITION:  To home on postoperative day 2.  DISCHARGE MEDICATIONS: 1. Alprazolam. 2. Aspirin. 3. Pepto-Bismol 4. Tums. 5. Benadryl. 6. Flonase. 7. Gabapentin. 8. Metformin. 9. Levothyroxine. 10.Loratadine. 11.NovoLog. 12.Robaxin. 13.Percocet. 14.Lunesta. 15.Omeprazole. 16.Claritin D. 17.Protonix. 18.Alka-Seltzer.  ACTIVITY:  The patient should increase her activity slowly.  She should use a walker at all times.  She is able to shower.  WOUND CARE:  She will need daily dressing changes.  DIET:  An 1800-calories diabetic diet.  FOLLOWUP:  She will follow up with Dr. Darrelyn Hillock 2 weeks from the day of surgery.  CONDITION ON DISCHARGE:  Improving.     Rozell Searing, PAC   ______________________________ Georges Lynch Darrelyn Hillock, M.D.    LD/MEDQ  D:  07/02/2010  T:  07/02/2010  Job:  [id number]  Electronically Signed by Rozell Searing  on 07/07/2010 08:10:59 AM Electronically Signed by Ranee Gosselin M.D. on 07/07/2010 09:45:13 AM

## 2010-07-16 NOTE — Progress Notes (Signed)
Summary: Lab order  Phone Note Call from Patient   Caller: Patient Summary of Call: Pt would like to have labs done prior to next OV. Please order needed labs and I will fax.  Initial call taken by: Payton Spark CMA,  July 07, 2010 10:30 AM  Follow-up for Phone Call        printed. Follow-up by: Seymour Bars DO,  July 07, 2010 10:43 AM     Appended Document: Lab order faxed  Appended Document: Lab order

## 2010-07-22 ENCOUNTER — Telehealth: Payer: Self-pay | Admitting: Family Medicine

## 2010-07-28 NOTE — Progress Notes (Addendum)
Summary: Does not want to return to pain clinic  Phone Note Call from Patient   Caller: Patient Summary of Call: Pt states she would like your help coming off of pain meds. She does not want to return to the pain clinic bc they charged her $50 cancellation fee. She prefers to come off meds but slowly. She has apt w/ back surgeon next week and will get a few pills from him to "tie her over".  She will just need you advise on how to come off meds safely. Please advise. Initial call taken by: Payton Spark CMA,  July 22, 2010 11:47 AM  Follow-up for Phone Call        will need a list (from pharmacy) of what all she is taking now. Follow-up by: Seymour Bars DO,  July 22, 2010 8:35 PM     Appended Document: Does not want to return to pain clinic called to get list from [facility]  Appended Document: Does not want to return to pain clinic Marcelino Duster, Pls let pt know that I reviewed her med list from [facility] and it looks like she has only been taking oxycodone (short acting).  I will be happy to help pt wean down on this.  Have her come in to pick up RX tomorrow and sign a narcotic contract.  Seymour Bars, D.O.  Appended Document: Does not want to return to pain clinic Pt states she saw Dr. Juliene Pina and he gave her Oxycodone 15mg  four x daily but only gave her 1 weeks worth. Pt would like to continue the same w/ you. Pt will pick up this Fri which is when Rx will be due. Please advise.  Appended Document: Does not want to return to pain clinic Prescriptions: OXYCODONE HCL 15 MG TABS (OXYCODONE HCL) 1 tab by mouth q 8 hrs as needed severe pain  #90 x 0   Entered and Authorized by:   Seymour Bars DO   Signed by:   Seymour Bars DO on 07/27/2010   Method used:   Print then Give to Patient   RxID:   [id number]  RX printed for 90 tabs to last 30 days. she is to sign pain contract here and f/u with me at the end of 30 days.  Seymour Bars, D.O.  Appended Document: Does not want to return to  pain clinic Pt aware of the above

## 2010-07-28 NOTE — Letter (Signed)
Summary: [facility]  [facility]   Imported By: Lanelle Bal 07/23/2010 11:05:22  _____________________________________________________________________  External Attachment:    Type:   Image     Comment:   External Document

## 2010-07-31 ENCOUNTER — Encounter: Payer: Self-pay | Admitting: Family Medicine

## 2010-08-03 ENCOUNTER — Ambulatory Visit: Payer: Self-pay | Admitting: Family Medicine

## 2010-08-03 LAB — CONVERTED CEMR LAB
Albumin: 5 g/dL (ref 3.5–5.2)
CO2: 23 meq/L (ref 19–32)
Glucose, Bld: 209 mg/dL — ABNORMAL HIGH (ref 70–99)
Potassium: 4.7 meq/L (ref 3.5–5.3)
Sodium: 138 meq/L (ref 135–145)
TSH: 0.464 microintl units/mL (ref 0.350–4.500)
Total Bilirubin: 0.4 mg/dL (ref 0.3–1.2)
Total Protein: 8 g/dL (ref 6.0–8.3)
Vit D, 25-Hydroxy: 50 ng/mL (ref 30–89)

## 2010-08-04 ENCOUNTER — Encounter: Payer: Self-pay | Admitting: Family Medicine

## 2010-08-14 ENCOUNTER — Encounter: Payer: Self-pay | Admitting: Family Medicine

## 2010-08-14 ENCOUNTER — Ambulatory Visit (INDEPENDENT_AMBULATORY_CARE_PROVIDER_SITE_OTHER): Payer: Commercial Indemnity | Admitting: Family Medicine

## 2010-08-14 DIAGNOSIS — E119 Type 2 diabetes mellitus without complications: Secondary | ICD-10-CM

## 2010-08-14 DIAGNOSIS — J441 Chronic obstructive pulmonary disease with (acute) exacerbation: Secondary | ICD-10-CM

## 2010-08-14 DIAGNOSIS — K7689 Other specified diseases of liver: Secondary | ICD-10-CM

## 2010-08-14 DIAGNOSIS — J449 Chronic obstructive pulmonary disease, unspecified: Secondary | ICD-10-CM

## 2010-08-14 LAB — CONVERTED CEMR LAB: Hgb A1c MFr Bld: 5.1 %

## 2010-08-17 ENCOUNTER — Other Ambulatory Visit: Payer: Self-pay | Admitting: Family Medicine

## 2010-08-17 DIAGNOSIS — K760 Fatty (change of) liver, not elsewhere classified: Secondary | ICD-10-CM

## 2010-08-18 NOTE — Assessment & Plan Note (Signed)
Summary: COPD exac/ pain/ A1C/ fatty liver   Vital Signs:  Patient profile:   60 year old female Height:      64.5 inches Weight:      126.50 pounds BMI:     21.46 O2 Sat:      96 % on Room air Temp:     98.3 degrees F oral Pulse rate:   128 / minute BP sitting:   124 / 81  (right arm) Cuff size:   regular  Vitals Entered By: Francee Piccolo CMA Duncan Dull) (August 14, 2010 10:46 AM)  O2 Flow:  Room air CC: follow-up visit ? to discuss ultrasound, pt also having sinus headache, pressure..taking claritin, mucinex...SP Is Patient Diabetic? Yes   Primary Care Provider:  Seymour Bars DO  CC:  follow-up visit ? to discuss ultrasound, pt also having sinus headache, pressure..taking claritin, and mucinex...SP.  History of Present Illness: 60 year old female presents for followup liver function, DM.  She has a history of fatty liver disease with elevated liver enzymes.  She is due for a liver ultrasound for f/u fatty liver and chronically elevated LFTs w/ hx of ETOH overuse.  She is taking her Metformin for her DM.  Random FSBG in the afternoon is usually around 127 but has been as high as 180.  She does not take postprandials or morning sugars.  Saw ophthalmologist about a year ago and is aware she needs to schedule another appointment.  She does check her feet regularly. Lipids UTD.  She also states she has had a headache for a couple of weeks that has been intermittent.  She also states she has had a mild productive cough and some rhinitis.  She is currently on claritin-D and flonase for allergies which have helped with the rhinitis and cough.  She has tried ibuprofen and asthma for her headache but these have not alleviated her symptoms.  She does state that her ears feel like they have more pressure behind them but denies sore throat. She denies nausea, vomiting, abdominal pain and fevers.  Current Medications (verified): 1)  Alprazolam 1 Mg  Tabs (Alprazolam) .[name].. 1 Tab By Mouth Two  Times A Day As Needed Anxiety 2)  Gabapentin 600 Mg Tabs (Gabapentin) .[name].. 1 Tab By Mouth Tid 3)  Metformin Hcl 850 Mg Tabs (Metformin Hcl) .[name].. 1 Tab By Mouth Bid 4)  Levothyroxine Sodium 125 Mcg Tabs (Levothyroxine Sodium) .[name].. 1 Tab By Mouth Daily 5)  Ventolin Hfa 108 (90 Base) Mcg/act Aers (Albuterol Sulfate) .... 2 Puffs Q 4 Hrs Prn 6)  Oxycodone Hcl 15 Mg Tabs (Oxycodone Hcl) .[name].. 1 Tab By Mouth Q 8 Hrs As Needed Severe Pain 7)  Flonase 50 Mcg/act Susp (Fluticasone Propionate) .... 2 Sprays/ Nostril Daily 8)  Citalopram Hydrobromide 20 Mg Tabs (Citalopram Hydrobromide) .[name].. 1 Tab By Mouth Daily 9)  Omeprazole 40 Mg Cpdr (Omeprazole) .[name].. 1 Tab By Mouth Daily, 30 Min Before Breakfast 10)  Claritin 10 Mg Tabs (Loratadine) .[name].. 1 Tab By Mouth Daily (Otc) 11)  Spiriva Handihaler 18 Mcg Caps (Tiotropium Bromide Monohydrate) .[name].. 1 Capsule Inhalation Daily 12)  Mupirocin 2 % Oint (Mupirocin) .... Apply To Hand Rash Two Times A Day 13)  Atorvastatin Calcium 40 Mg Tabs (Atorvastatin Calcium) .[name].. 1 Tab By Mouth At Bedtime  Allergies (verified): 1)  ! Codeine 2)  ! * Ultracet 3)  ! Symbicort (Budesonide-Formoterol Fumarate) 4)  ! Advair Hfa (Fluticasone-Salmeterol) 5)  ! * Fentanyl  Past History:  Past  Medical History: Reviewed history from 06/10/2010 and no changes required. OA cervicalgia chronic pain syndrome (Dr Oneal Grout); was on Methadone Hx of narcotic Abuse anxiety DM , 2002 ([facility]) hypothryroid high chol COPD osteoporosis fatty liver dz lumbar DDD  Past Surgical History: Reviewed history from 06/10/2010 and no changes required. GB breast lump, benign knee R and L hand 2007 R knee TKR 2010 lumbar laminectomy - Dr Netta Corrigan (05-2010)  Social History: Reviewed history from 06/05/2007 and no changes required. 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 Has 2 sons - 24 & 30. Smokes 1 ppd x 43 yrs. 5 ETOH/ wk. Denies drug use. does not work out  Review of Systems      See  HPI  Physical Exam  General:  alert, well-developed, and well-nourished.   Head:  normocephalic and atraumatic.   Eyes:  pupils equal, pupils round, and pupils reactive to light.  Conjunctiva clear. Ears:  Right and Left TM clear with good cone of light and visible bony landmarks. Nose:  Nasal turbinates mildly swollen with no erythema or discharge. Mouth:  Oropharynx clear and moist without exudate. Lungs:  Coarse breath sounds and wheezing bilaterally, normal respiratory effort, no crackles. Heart:  normal rate, regular rhythm, and no murmur.   Abdomen:  soft, non-tender, normal bowel sounds, and no distention.  Liver edge palpable but nontender. Msk:  Enlargement of PCP and MCP joints in hands bilaterally. Pulses:  R radial normal and L radial normal.   Skin:  color normal.   Cervical Nodes:  No lymphadenopathy. Psych:  good eye contact, not anxious appearing, and flat affect.     Impression & Recommendations:  Problem # 1:  CHRONIC PAIN SYNDROME (ICD-338.4) Patient still having pain on current regimen.  Has signed narcotics contract.  Will increase her frequency of oxycodone.  Will also switch her from citalopram to cymbalta as this can help with chronic pain and continue her gabapentin.  Will refer to rheumatology for potential RA given her synovitis and enlarged MCP and PCP joints in her hands.  She is following up with her surgeon next week.  Problem # 2:  CHRONIC OBSTRUCTIVE PULMONARY DISEASE, ACUTE EXACERBATION (ICD-491.21) Patient with two weeks of headache and cough in the setting of wheezing on physical exam favors acute exacerbation of COPD.  She has Ventolin inhaler at home but did not fill Spiriva.  She will fill spiriva as she noticed it did help.  Will put her on short course of prednisone and azithromycin for acute exacerbation.  Use Ventolin HFA 2 puffs 4 x a day for the next wk.  Avoid smoking.  Problem # 3:  DM (ICD-250.00)  Patient's A1C today is 5.1%.  Will  continue Metformin.  Lipids UTD, will schedule eye exam, checks feet regularly.  Urine micro needs to be updated.  Her updated medication list for this problem includes:    Metformin Hcl 850 Mg Tabs (Metformin hcl) .[name]... 1 tab by mouth bid  Orders: Fingerstick (36416) Hemoglobin A1C (83036)  Problem # 4:  ALCOHOLIC FATTY LIVER (ICD-571.0) Patient with history of fatty liver and elevated liver enzymes secondary to alcohol use.  Due for liver ultrasound. Will schedule today.  Complete Medication List: 1)  Alprazolam 1 Mg Tabs (Alprazolam) .[name].. 1 tab by mouth two times a day as needed anxiety 2)  Gabapentin 600 Mg Tabs (Gabapentin) .[name].. 1 tab by mouth tid 3)  Metformin Hcl 850 Mg Tabs (Metformin hcl) .[name].. 1 tab by mouth bid 4)  Levothyroxine  Sodium 125 Mcg Tabs (Levothyroxine sodium) .[name].. 1 tab by mouth daily 5)  Ventolin Hfa 108 (90 Base) Mcg/act Aers (Albuterol sulfate) .... 2 puffs q 4 hrs prn 6)  Oxycodone Hcl 15 Mg Tabs (Oxycodone hcl) .[name].. 1 tab by mouth q 6 hrs as needed severe pain 7)  Flonase 50 Mcg/act Susp (Fluticasone propionate) .... 2 sprays/ nostril daily 8)  Cymbalta 60 Mg Cpep (Duloxetine hcl) .[name].. 1 capusle by mouth qam 9)  Omeprazole 40 Mg Cpdr (Omeprazole) .[name].. 1 tab by mouth daily, 30 min before breakfast 10)  Claritin 10 Mg Tabs (Loratadine) .[name].. 1 tab by mouth daily (otc) 11)  Spiriva Handihaler 18 Mcg Caps (Tiotropium bromide monohydrate) .[name].. 1 capsule inhalation daily 12)  Mupirocin 2 % Oint (Mupirocin) .... Apply to hand rash two times a day 13)  Atorvastatin Calcium 40 Mg Tabs (Atorvastatin calcium) .[name].. 1 tab by mouth at bedtime 14)  Prednisone 20 Mg Tabs (Prednisone) .... 3 tabs by mouth once daily x 5 days 15)  Azithromycin 250 Mg Tabs (Azithromycin) .... 2 tabs by mouth x 1 day then 1 tab by mouth daily x 4 days  Other Orders: T-Ultrasound Abdominal, Complete (60454)  Patient Instructions: 1)  Change Citalopram to Cymbalta -- start at 30 mg daily x 1 wk  then go up to 60 mg daily (for mood and pain). 2)  Increased Oxycodone to 15 mg up to 4 x a day for severe pain. 3)  Referral to rheumatology made for arthritis, ? RA. 4)  F/U with Dr Darrelyn Hillock. 5)  Take 5 days of Prednisone + 5 days of Azithromycin + Albuterol inhaler 2 puffs 4 x a day for the next wk for COPD exacerbation. 6)  A1C 5.1= perfect diabetes control. 7)  Will schedule liver ultrasound downstairs. 8)  F/u mood/ pain in 2 mos. Prescriptions: CYMBALTA 60 MG CPEP (DULOXETINE HCL) 1 capusle by mouth qAM  #30 x 1   Entered and Authorized by:   Seymour Bars DO   Signed by:   Seymour Bars DO on 08/14/2010   Method used:   Electronically to        [facility] [phone]* (retail)       [address]       [place], [place]  [address]       Ph: [phone]       Fax: [phone]   RxID:   [id number] AZITHROMYCIN 250 MG TABS (AZITHROMYCIN) 2 tabs by mouth x 1 day then 1 tab by mouth daily x 4 days  #6 tabs x 0   Entered and Authorized by:   Seymour Bars DO   Signed by:   Seymour Bars DO on 08/14/2010   Method used:   Electronically to        [facility] [phone]* (retail)       [address]       [place], [place]  [address]       Ph: [phone]       Fax: [phone]   RxID:   [id number] PREDNISONE 20 MG TABS (PREDNISONE) 3 tabs by mouth once daily x 5 days  #15 x 0   Entered and Authorized by:   Seymour Bars DO   Signed by:   Seymour Bars DO on 08/14/2010   Method used:   Electronically to        [facility] [phone]* (retail)       [address]  [place], [place]  [address]       Ph: [phone]       Fax: [phone]   RxID:   [id number] OXYCODONE HCL 15 MG TABS (OXYCODONE HCL) 1 tab by mouth q 6 hrs as needed severe pain  #120 x 0   Entered and Authorized by:   Seymour Bars DO   Signed by:   Seymour Bars DO on 08/14/2010   Method used:   Print then Give to Patient   RxID:   [id number]    Orders Added: 1)  T-Ultrasound  Abdominal, Complete [76700] 2)  Est. Patient Level IV [64403] 3)  Fingerstick [36416] 4)  Hemoglobin A1C [83036]    Laboratory Results   Blood Tests   Date/Time Received: August 14, 2010 12:07 PM Date/Time Reported: August 14, 2010 12:07 PM  Francee Piccolo CMA (AAMA)  August 14, 2010 12:07 PM   HGBA1C: 5.1%   (Normal Range: Non-Diabetic - 3-6%   Control Diabetic - 6-8%)

## 2010-08-19 ENCOUNTER — Other Ambulatory Visit: Payer: Commercial Indemnity

## 2010-08-20 ENCOUNTER — Ambulatory Visit
Admission: RE | Admit: 2010-08-20 | Discharge: 2010-08-20 | Disposition: A | Discharge status: Home / Self Care | Payer: Commercial Indemnity | Source: Ambulatory Visit | Attending: Family Medicine | Admitting: Family Medicine

## 2010-08-20 ENCOUNTER — Other Ambulatory Visit: Payer: Self-pay | Admitting: Family Medicine

## 2010-08-20 DIAGNOSIS — K760 Fatty (change of) liver, not elsewhere classified: Secondary | ICD-10-CM

## 2010-08-20 LAB — CBC
HCT: 43.9 % (ref 36.0–46.0)
Hemoglobin: 14.4 g/dL (ref 12.0–15.0)
WBC: 11 10*3/uL — ABNORMAL HIGH (ref 4.0–10.5)

## 2010-08-25 ENCOUNTER — Other Ambulatory Visit: Payer: Self-pay | Admitting: Family Medicine

## 2010-08-25 DIAGNOSIS — E119 Type 2 diabetes mellitus without complications: Secondary | ICD-10-CM

## 2010-08-26 ENCOUNTER — Telehealth: Payer: Self-pay | Admitting: *Deleted

## 2010-08-26 DIAGNOSIS — M1990 Unspecified osteoarthritis, unspecified site: Secondary | ICD-10-CM

## 2010-08-26 DIAGNOSIS — J449 Chronic obstructive pulmonary disease, unspecified: Secondary | ICD-10-CM

## 2010-08-26 DIAGNOSIS — K219 Gastro-esophageal reflux disease without esophagitis: Secondary | ICD-10-CM

## 2010-08-26 MED ORDER — CELECOXIB 200 MG PO CAPS: 200.0000 mg | ORAL_CAPSULE | Freq: Every day | ORAL | Status: DC

## 2010-08-26 MED ORDER — TIOTROPIUM BROMIDE MONOHYDRATE 18 MCG IN CAPS: 18.0000 ug | ORAL_CAPSULE | Freq: Every day | RESPIRATORY_TRACT | Status: DC

## 2010-08-26 MED ORDER — ESOMEPRAZOLE MAGNESIUM 40 MG PO CPDR: 40.0000 mg | DELAYED_RELEASE_CAPSULE | Freq: Every day | ORAL | Status: DC

## 2010-08-26 NOTE — Telephone Encounter (Signed)
Pt requests refills of Spiriva, Nexium and Celebrex. She has not been on nexium and celebrex in a while but requests that they be refilled. Please advise.

## 2010-08-26 NOTE — Telephone Encounter (Signed)
OK to RF

## 2010-09-01 ENCOUNTER — Telehealth: Payer: Self-pay | Admitting: *Deleted

## 2010-09-01 NOTE — Telephone Encounter (Signed)
Pt LMOM stating she has continued to have pain and is needing to use oxycodone more often. Pt requests increasing oxycodone to 5 x daily. Please advise.

## 2010-09-02 NOTE — Telephone Encounter (Signed)
I am not comfortable increasing her frequency.  She is free to see the pain clinic.

## 2010-09-02 NOTE — Telephone Encounter (Signed)
LMOM informing Pt of the above 

## 2010-09-03 ENCOUNTER — Telehealth: Payer: Self-pay | Admitting: *Deleted

## 2010-09-03 DIAGNOSIS — M1990 Unspecified osteoarthritis, unspecified site: Secondary | ICD-10-CM

## 2010-09-03 NOTE — Telephone Encounter (Signed)
Pt states it is fine to refer her to [facility] [phone].  She also wanted you to know that Dr. Rubin Payor agreed to 4-5 oxycodone daily but will not write for it bc she does not see him often. Pt requests that you atleast write for #4 daily until she can get in w/ pain clinic. Please advise.

## 2010-09-03 NOTE — Telephone Encounter (Signed)
I reviewed her chart and she is written for oxycodone 4 x a day with #120/ month.  I don't ever prescribe more than this, so she will have to stick with this until she sees the pain clinic OR she can get this from Dr Darrelyn Hillock.

## 2010-09-03 NOTE — Telephone Encounter (Signed)
LMOM informing Pt of the above 

## 2010-09-04 LAB — CBC
Hemoglobin: 12.6 g/dL (ref 12.0–15.0)
Platelets: 248 10*3/uL (ref 150–400)
RDW: 12.8 % (ref 11.5–15.5)

## 2010-09-04 LAB — GLUCOSE, CAPILLARY
Glucose-Capillary: 104 mg/dL — ABNORMAL HIGH (ref 70–99)
Glucose-Capillary: 114 mg/dL — ABNORMAL HIGH (ref 70–99)
Glucose-Capillary: 115 mg/dL — ABNORMAL HIGH (ref 70–99)
Glucose-Capillary: 134 mg/dL — ABNORMAL HIGH (ref 70–99)
Glucose-Capillary: 135 mg/dL — ABNORMAL HIGH (ref 70–99)
Glucose-Capillary: 151 mg/dL — ABNORMAL HIGH (ref 70–99)
Glucose-Capillary: 105 mg/dL — ABNORMAL HIGH (ref 70–99)
Glucose-Capillary: 119 mg/dL — ABNORMAL HIGH (ref 70–99)
Glucose-Capillary: 128 mg/dL — ABNORMAL HIGH (ref 70–99)

## 2010-09-04 LAB — COMPREHENSIVE METABOLIC PANEL
ALT: 99 U/L — ABNORMAL HIGH (ref 0–35)
AST: 44 U/L — ABNORMAL HIGH (ref 0–37)
Albumin: 4 g/dL (ref 3.5–5.2)
Alkaline Phosphatase: 112 U/L (ref 39–117)
CO2: 27 mEq/L (ref 19–32)
Chloride: 103 mEq/L (ref 96–112)
GFR calc Af Amer: >60 mL/min (ref >60)
GFR calc non Af Amer: >60 mL/min (ref >60)
Potassium: 4 mEq/L (ref 3.5–5.1)
Sodium: 136 mEq/L (ref 135–145)
Total Bilirubin: 0.2 mg/dL — ABNORMAL LOW (ref 0.3–1.2)

## 2010-09-04 LAB — TYPE AND SCREEN: ABO/RH(D): O NEG

## 2010-09-04 LAB — DIFFERENTIAL
Basophils Absolute: 0 10*3/uL (ref 0.0–0.1)
Basophils Relative: 1 % (ref 0–1)
Eosinophils Absolute: 0.3 10*3/uL (ref 0.0–0.7)
Eosinophils Relative: 4 % (ref 0–5)
Lymphocytes Relative: 30 % (ref 12–46)
Monocytes Absolute: 0.6 10*3/uL (ref 0.1–1.0)

## 2010-09-04 LAB — URINALYSIS, ROUTINE W REFLEX MICROSCOPIC
Hgb urine dipstick: NEGATIVE
Specific Gravity, Urine: 1.012 (ref 1.005–1.030)
Urobilinogen, UA: 0.2 mg/dL (ref 0.0–1.0)

## 2010-09-04 LAB — URINE MICROSCOPIC-ADD ON

## 2010-09-04 LAB — PROTIME-INR: INR: 1 (ref 0.00–1.49)

## 2010-09-05 ENCOUNTER — Inpatient Hospital Stay (INDEPENDENT_AMBULATORY_CARE_PROVIDER_SITE_OTHER)
Admission: RE | Admit: 2010-09-05 | Discharge: 2010-09-05 | Disposition: A | Discharge status: Home / Self Care | Payer: Commercial Indemnity | Source: Ambulatory Visit | Attending: Emergency Medicine | Admitting: Emergency Medicine

## 2010-09-05 ENCOUNTER — Encounter: Payer: Self-pay | Admitting: Emergency Medicine

## 2010-09-05 DIAGNOSIS — J0100 Acute maxillary sinusitis, unspecified: Secondary | ICD-10-CM

## 2010-09-08 ENCOUNTER — Encounter: Payer: Self-pay | Admitting: Family Medicine

## 2010-09-09 ENCOUNTER — Ambulatory Visit (INDEPENDENT_AMBULATORY_CARE_PROVIDER_SITE_OTHER): Payer: Commercial Indemnity | Admitting: Family Medicine

## 2010-09-09 DIAGNOSIS — G894 Chronic pain syndrome: Secondary | ICD-10-CM

## 2010-09-09 DIAGNOSIS — F329 Major depressive disorder, single episode, unspecified: Secondary | ICD-10-CM

## 2010-09-09 DIAGNOSIS — J019 Acute sinusitis, unspecified: Secondary | ICD-10-CM

## 2010-09-09 DIAGNOSIS — F3289 Other specified depressive episodes: Secondary | ICD-10-CM

## 2010-09-09 DIAGNOSIS — J0191 Acute recurrent sinusitis, unspecified: Secondary | ICD-10-CM

## 2010-09-09 MED ORDER — OXYCODONE HCL 15 MG PO TABS: 15.0000 mg | ORAL_TABLET | ORAL | Status: DC | PRN

## 2010-09-09 MED ORDER — LEVOFLOXACIN 750 MG PO TABS: 750.0000 mg | ORAL_TABLET | Freq: Every day | ORAL | Status: AC

## 2010-09-09 NOTE — Assessment & Plan Note (Signed)
Pt has been on 2-3 rounds of abx in the past 3 mos for sinusitis.  She does not seem to be improving desite abx, decongestants, mucolytics, anti histamines, netti pot, nasal steroids, etc.  Will proceed with CT sinuses W/o contrast to see if she has chronic sinusitis or anatomic obstrution which would mean a referral to ENT.  For now, I will change her abx to levaquin.  She can stay on her other meds.

## 2010-09-09 NOTE — Assessment & Plan Note (Signed)
Chronic back pain, followed by Dr Netta Corrigan, after having surgery in 2011.  Her referral is in place to see the pain clinic next month.  I did fill her Percocet today but she understands that her RF will be from the pain clinic.

## 2010-09-09 NOTE — Progress Notes (Signed)
  Subjective:    Patient ID: [name], female    DOB: 02-14-1951, 60 y.o.   MRN: [id number]  HPI  60 yo WF presents for sinus pain and congestion x 1 wk.  She is taking Amoxicillin x 6 days but it is not helping.  She is taking Mucinex and and a decongestant but it's not helping.  She is using a [employer], clartin D and Flonase.  Has never had sinus surgery.  She is not blowing her nose much.  No sore throat.  Has chills but no fever.  She is fatigued.  She is currently getting set up with the pain clinic for her chronicback pain requiring more and more narcotics which I told her that I was not comfortable filling for her.  She is tearful that she is going to run out 3 days early this month.  She is set up to see pain clinic in 1 month.      Review of Systems  Constitutional: Positive for chills and fatigue. Negative for fever.  HENT: Positive for congestion, rhinorrhea, sneezing and postnasal drip. Negative for ear pain, nosebleeds and sore throat.   Respiratory: Negative for cough and shortness of breath.   Cardiovascular: Negative for chest pain.  Musculoskeletal: Positive for back pain.  Neurological: Positive for headaches. Negative for dizziness.  Psychiatric/Behavioral: Positive for sleep disturbance and dysphoric mood. Negative for suicidal ideas. The patient is not nervous/anxious.        Objective:   Physical Exam  Constitutional: She appears well-developed and well-nourished. She appears distressed.  HENT:  Head: Normocephalic and atraumatic.  Right Ear: External ear normal.  Left Ear: External ear normal.  Nose: Nose normal.  Mouth/Throat: Oropharynx is clear and moist. No oropharyngeal exudate.       bilat maxillary and frontal sinus TTP  Eyes: Conjunctivae are normal. Left eye exhibits no discharge. No scleral icterus.  Neck: Normal range of motion.  Cardiovascular: Normal rate and regular rhythm.   No murmur heard. Pulmonary/Chest: Effort normal and breath sounds  normal. She has no wheezes.       Prolonged exp phase  Musculoskeletal: She exhibits no edema.  Lymphadenopathy:    She has no cervical adenopathy.  Skin: Skin is warm and dry.  Psychiatric:       Depressed, tearful          Assessment & Plan:

## 2010-09-09 NOTE — Patient Instructions (Signed)
CT sinuses today. Will call you w/ results tomorrow.  RX given for Oxycodone.  F/U with pain clinic next month.  Change Amoxicillin to Levaquin x 5 days (antibiotic).

## 2010-09-09 NOTE — Assessment & Plan Note (Signed)
She is already on Cymbalta 60 mg/ day but her chronic pain, current illness and her ill dog has her mood down today.  Her husband is supportive.  Will make sure she is taking her meds and get her back in with psych if needed.

## 2010-09-10 ENCOUNTER — Ambulatory Visit
Admission: RE | Admit: 2010-09-10 | Discharge: 2010-09-10 | Disposition: A | Discharge status: Home / Self Care | Payer: Commercial Indemnity | Source: Ambulatory Visit | Attending: Family Medicine | Admitting: Family Medicine

## 2010-09-10 ENCOUNTER — Telehealth: Payer: Self-pay | Admitting: Family Medicine

## 2010-09-10 DIAGNOSIS — J309 Allergic rhinitis, unspecified: Secondary | ICD-10-CM

## 2010-09-10 NOTE — Telephone Encounter (Signed)
Pls let pt know that her CT of the sinuses is completely normal.  She does NOT need to continue Levaquin.  I will set her up with allergist.

## 2010-09-10 NOTE — Telephone Encounter (Signed)
Pt aware of the above  

## 2010-09-22 ENCOUNTER — Telehealth: Payer: Self-pay | Admitting: *Deleted

## 2010-09-22 NOTE — Telephone Encounter (Signed)
Pt states you were going to refer her to neurology but hasn't heard from anyone. Pt would like to know if you think she still needs to go. Please advise.

## 2010-09-22 NOTE — Telephone Encounter (Signed)
There was nothing in my last note about sending her to neurology but there was something about her seeing a pain clinic. Make sure this is in the works.

## 2010-09-23 NOTE — Telephone Encounter (Signed)
LMOM informing Pt of the above 

## 2010-09-30 ENCOUNTER — Other Ambulatory Visit: Payer: Self-pay | Admitting: *Deleted

## 2010-09-30 MED ORDER — ALPRAZOLAM 1 MG PO TABS: 1.0000 mg | ORAL_TABLET | Freq: Two times a day (BID) | ORAL | Status: DC | PRN

## 2010-10-06 ENCOUNTER — Encounter: Payer: Self-pay | Admitting: Family Medicine

## 2010-10-06 ENCOUNTER — Ambulatory Visit (INDEPENDENT_AMBULATORY_CARE_PROVIDER_SITE_OTHER): Payer: Managed Care, Other (non HMO) | Admitting: Family Medicine

## 2010-10-06 DIAGNOSIS — J309 Allergic rhinitis, unspecified: Secondary | ICD-10-CM

## 2010-10-06 DIAGNOSIS — F329 Major depressive disorder, single episode, unspecified: Secondary | ICD-10-CM

## 2010-10-06 DIAGNOSIS — G894 Chronic pain syndrome: Secondary | ICD-10-CM

## 2010-10-06 DIAGNOSIS — F3289 Other specified depressive episodes: Secondary | ICD-10-CM

## 2010-10-06 MED ORDER — OXYCODONE HCL 15 MG PO TABS: 15.0000 mg | ORAL_TABLET | ORAL | Status: DC | PRN

## 2010-10-06 MED ORDER — QUETIAPINE FUMARATE 25 MG PO TABS: 25.0000 mg | ORAL_TABLET | Freq: Every day | ORAL | Status: AC

## 2010-10-06 NOTE — Assessment & Plan Note (Signed)
Chronic back pain, worsening.  Seeing Dr Ciro Backer, on Oxycodone.  We are trying to get her in with a pain clinic to follow.  I'd like her to stay on Cymbalta since this can help with both mood and pain.  Oxycodone RFd today.

## 2010-10-06 NOTE — Patient Instructions (Signed)
See allergist this wk.  OK to use Excedrin and Sudafed for now.  See Dr Netta Corrigan pain clinic for ongoing pain.  Start Seroquel in the evenings for mood.  Return for f/u mood/ BP in 1 month.

## 2010-10-06 NOTE — Assessment & Plan Note (Signed)
Assuming her chronic recurrent 'sinusitis' is actually allergies after having a normal sinus CT last month.  She is all set up for allergy testing in 2 days.

## 2010-10-06 NOTE — Assessment & Plan Note (Signed)
Worsening mood with chronic pain.  Will continue Cymbalta and prn use of Alprazolam but will add Seroquel as an adjunct for MDD today, take at bedtime.  Discussed common SEs.  Call if any problems.  RTC for f/u in 1 month.

## 2010-10-06 NOTE — Progress Notes (Signed)
  Subjective:    Patient ID: [name], female    DOB: 17-Nov-1950, 60 y.o.   MRN: [id number]  HPI 60 yo WF presents for f/u visit.  She is set up to see the allergist in 2 days after having a normal sinus CT scan last month with hx of chronic recurrent head congestion and HAs.  She is worse now since she is off anti histamines in preparation for allergy testing.  She continues to struggle with chronic back pain.  Seeing Dr Netta Corrigan. Had another steroid shot in her back that didn't do much.  She is dependent on Oxycodone RX which helps some.  She admits to having a worsening in her depression and poor sleep in relation to severe pain.  She was set up to see a pain clinic but they were not covered under [place] so she is looking for someone else to see.  BP 173/109  Pulse 111  Ht 5\' 3"  (1.6 m)  Wt 128 lb (58.06 kg)  BMI 22.67 kg/m2  SpO2 98%    Review of Systems  Constitutional: Positive for fatigue. Negative for fever and unexpected weight change.  Respiratory: Negative for shortness of breath.   Cardiovascular: Negative for chest pain, palpitations and leg swelling.  Gastrointestinal: Negative for constipation.  Musculoskeletal: Positive for myalgias, back pain and arthralgias.  Neurological: Negative for weakness and numbness.  Psychiatric/Behavioral: Positive for sleep disturbance, dysphoric mood and decreased concentration. Negative for suicidal ideas, hallucinations, confusion and self-injury. The patient is nervous/anxious.        Objective:   Physical Exam  Constitutional: She appears well-developed and well-nourished. No distress.  Eyes: No scleral icterus.  Neck: Neck supple.  Cardiovascular: Regular rhythm, S1 normal and S2 normal.  Tachycardia present.   Pulmonary/Chest: Effort normal. No respiratory distress. She has wheezes in the right lower field and the left lower field. She has no rhonchi. She has no rales.  Musculoskeletal: She exhibits no edema.  Skin: Skin is  warm and dry.  Psychiatric: Her speech is normal. Judgment and thought content normal. She is withdrawn. Cognition and memory are normal. She exhibits a depressed mood.          Assessment & Plan:

## 2010-10-13 NOTE — H&P (Signed)
NAMEGABBI, [name]                 ACCOUNT NO.:  [id number]   MEDICAL RECORD NO.:  [id number]          PATIENT TYPE:  INP   LOCATION:  NA                           FACILITY:  [facility]   PHYSICIAN:  Georges Lynch. Gioffre, M.D.DATE OF BIRTH:  07-12-1950   DATE OF ADMISSION:  10/25/2007  DATE OF DISCHARGE:                              HISTORY & PHYSICAL   CHIEF COMPLAINT:  Painful right knee.   HISTORY OF PRESENT ILLNESS:  The patient is a 60 year old female here  today for history and physical for her upcoming right total knee  arthroplasty by Dr. Darrelyn Hillock.  The patient has been evaluated by Dr.  Darrelyn Hillock found to have end-stage osteoarthritis of the knee.  She has  pain with range of motion  ambulation.  She is currently receiving  chronic pain management through Dr. Oneal Grout for her multiple issues.  The  patient would like to proceed with a total knee arthroplasty.  The  patient has been evaluated by Dr. Seymour Bars, primary care physician  and cleared from the surgical standpoint.   ALLERGIES:  1. CODEINE.  2. ULTRACET.   CURRENT MEDICATIONS:  1. Vicodin 10 mg 1 tablet every 4 hours.  2. Alprazolam 1 mg 2 times a day as needed.  3. Omeprazole 20 mg a day.  4. Zetia 10 mg a day.  5. Gabapentin 400 mg 1 tablet 3 times a day if needed.  6. Metformin 1000 mg once a day.  7. Levothyroxine 137 mcg once a day.  8. Alendronate 70 mg once a month  9. ProAir HFA 108 mcg 2 puffs q.6. h p.r.n.   PRESENT MEDICAL HISTORY:  1. Diabetes.  2. Chronic pain syndrome.  3. Anxiety.  4. Hypothyroidism.  5. Hypercholesterolemia.  6. COPD.  7. Tobacco use.  8. Osteoporosis.  9. History of narcotic abuse.  10.Osteoarthritis.   PAST SURGICAL HISTORY:  1. Includes cholecystectomy.  2. Benign breast lump removed.  3. Arthroscopies of right and left knee.  4. Hand surgery in 2007 without any complications from anesthesia.   FAMILY MEDICAL HISTORY:  Mother and father are both deceased.  Father  had  diabetes, rheumatoid arthritis, heart disease.  Mother had cancer,  rheumatoid disease and heart disease.   SOCIAL HISTORY:  The patient is married.  She is a Lawyer.  She currently smokes about 1 pack a day for the last 40 years.  She has  about 2 alcoholic beverages a day.  She has 2 grown children.   REVIEW OF SYSTEMS:  Negative for any neurologic issues.  She denies any  shortness of breath or productive cough.  She does smoke about 1 pack a  day.  CARDIOVASCULAR:  She denies any chest pains, irregular heart  rhythms or previous cardiac problems.  GI: She does have some  hemorrhoids.  She denies any kidney stones, diverticulitis or any  problems with unusual nausea, vomiting.  GU:  She denies any frequent  urinary tract infection.  She does have a little difficulty holding her  urine when she needs to go.  ENDOCRINE:  She  does have issues related to  thyroid and diabetes which appears to be well controlled on current  medications.   PHYSICAL EXAMINATION:  VITAL SIGNS:  The patient is 5 feet 3 inches, 136  pounds, blood pressure is 118/68, pulse of 70, respirations are 12,  unlabored, patient is afebrile.  GENERAL:  This is a healthy-appearing frail female, conscious, alert and  appropriate.  Appears to be a good historian.  Ambulates with a fairly  normal easy balanced gait with a cane.  HEENT:  Head was normocephalic.  Pupils are equal, round and reactive.  Extraocular movements intact.  Sclerae not icteric.  Gross hearing is  intact.  NECK:  Good range of motion.  She has no palpable lymphadenopathy.  CHEST:  Lung sounds were clear and equal bilaterally.  HEART:  Regular rate and rhythm.  No murmurs.  ABDOMEN:  Soft, nontender.  Bowel sounds present.  EXTREMITIES:  Upper extremities had good range of motion of shoulders,  elbows and wrists.  Lower extremities:  Right and left hip had full  range of motion without any discomfort.  Right knee, she was able to  fully  extend, flex it back to 130.  She had painful range of motion.  No  ligament instability.  No signs of infection.  No effusion.  Calf is  soft, nontender.  She has full range of motion of her left knee without  any instability.  Both ankles are symmetrical with good dorsi plantar  flexion.  PERIPHERAL VASCULAR:  Carotid pulses were 2+, bruits.  Radial pulses 2+.  Posterior tibial pulses were 2+.  She had no lower extremity edema or  venostasis changes.  NEURO:  The patient was grossly neurologically intact without any major  deficits.  BREASTS/RECTAL/GU:  Deferred at this time.   IMPRESSION:  1. End-stage osteoarthritis right knee.  2. Osteoarthritis left knee.  3. Diabetes.  4. Chronic pain syndrome with history of narcotic abuse in the past,      currently managed by Dr. Oneal Grout.  5. Anxiety.  6. Diabetes.  7. Hypothyroidism.  8. Hypercholesterolemia.  9. Chronic obstructive pulmonary disease.  10.Tobacco use  11.Osteoporosis.   PLAN:  The patient has been evaluated by her primary care physician and  cleared for this upcoming procedure.  She will have a right total knee  arthroplasty by Dr. Darrelyn Hillock at [facility] on Oct 25, 2007.      Jamelle Rushing, P.A.    ______________________________  Georges Lynch Darrelyn Hillock, M.D.    RWK/MEDQ  D:  10/19/2007  T:  10/19/2007  Job:  [id number]   cc:   Windy Fast A. Darrelyn Hillock, M.D.  Fax: [phone]

## 2010-10-18 ENCOUNTER — Other Ambulatory Visit: Payer: Self-pay | Admitting: Family Medicine

## 2010-11-04 ENCOUNTER — Other Ambulatory Visit: Payer: Self-pay | Admitting: Family Medicine

## 2010-11-27 ENCOUNTER — Encounter: Payer: Self-pay | Admitting: Family Medicine

## 2010-11-27 ENCOUNTER — Ambulatory Visit (INDEPENDENT_AMBULATORY_CARE_PROVIDER_SITE_OTHER): Payer: Managed Care, Other (non HMO) | Admitting: Family Medicine

## 2010-11-27 VITALS — BP 117/75 | HR 92 | Ht 65.0 in | Wt 139.0 lb

## 2010-11-27 DIAGNOSIS — L237 Allergic contact dermatitis due to plants, except food: Secondary | ICD-10-CM

## 2010-11-27 DIAGNOSIS — L255 Unspecified contact dermatitis due to plants, except food: Secondary | ICD-10-CM

## 2010-11-27 DIAGNOSIS — R21 Rash and other nonspecific skin eruption: Secondary | ICD-10-CM

## 2010-11-27 MED ORDER — METHYLPREDNISOLONE ACETATE 80 MG/ML IJ SUSP
80.0000 mg | Freq: Once | INTRAMUSCULAR | Status: AC
  Administered 2010-11-27: 60 mg via INTRAMUSCULAR

## 2010-11-27 MED ORDER — PREDNISONE 20 MG PO TABS: ORAL_TABLET | ORAL | Status: DC

## 2010-11-27 MED ORDER — ALPRAZOLAM 1 MG PO TABS: 1.0000 mg | ORAL_TABLET | Freq: Two times a day (BID) | ORAL | Status: DC | PRN

## 2010-11-27 NOTE — Progress Notes (Signed)
  Subjective:    Patient ID: [name], female    DOB: 27-Dec-1950, 60 y.o.   MRN: [id number]  HPI  60 yo WF presents for poison ivy that started on Wed and is getting worse.  It is on her face, arms and legs.  Her eyes are started to swell closed.  She tried Claritin which did not help.  She is not using anything topically.  She denies problems swallowing or chest tightness or SOB.    BP 117/75  Pulse 92  Ht 5\' 5"  (1.651 m)  Wt 139 lb (63.05 kg)  BMI 23.13 kg/m2  SpO2 96%   Review of Systems  Constitutional: Negative for fever, chills and fatigue.  HENT: Negative for sneezing.   Eyes: Positive for itching. Negative for pain.  Respiratory: Negative for chest tightness, shortness of breath and wheezing.   Skin: Negative for rash.       Objective:   Physical Exam  Constitutional: She appears well-developed and well-nourished.  HENT:  Mouth/Throat: Oropharynx is clear and moist.  Eyes: Conjunctivae are normal. Right eye exhibits no discharge.       Moderate upper and lower lid edema bilat  Neck: Neck supple.       Patent airway  Cardiovascular: Normal rate, regular rhythm and normal heart sounds.   Pulmonary/Chest: Effort normal. No stridor. She has wheezes.       Prolonged exp phase  Lymphadenopathy:    She has no cervical adenopathy.  Skin: Rash (vesicular erythematous rash on arms, legs and face) noted. There is erythema.  Psychiatric: She has a normal mood and affect.          Assessment & Plan:

## 2010-11-27 NOTE — Patient Instructions (Signed)
Steroid shot today.  Monitor sugars 3 x a day.  Call me if running > 350.  Start Prednisone on Sunday (if rash/ swelling is still there).  Use oatmeal baths and caladryl lotion for itching. Avoid re- exposure.  Call if any problems.

## 2010-11-27 NOTE — Assessment & Plan Note (Signed)
Treated with Depo Medrol 60 mg IM x 1 followed by a prednisone taper.  She went thru this last summer and it took over a month to resolved.  Reviewed supportive care measures.  No sign of airway compromise.  Call me if worsening or not improved in 2 wks.  Avoid re-exposure.

## 2010-12-03 ENCOUNTER — Telehealth: Payer: Self-pay | Admitting: *Deleted

## 2010-12-03 NOTE — Telephone Encounter (Signed)
See message below °

## 2010-12-03 NOTE — Telephone Encounter (Signed)
Does she have hx of migraines? I do not see this in her chart.  She needs OV to look for cause and given her age, it is unusual to start having HAs.

## 2010-12-03 NOTE — Telephone Encounter (Signed)
Pt calls stating she has been getting more frequent HAs and thinks they may be migraines bc she has photophobia, phonophobia and nausea. Pt would like to know if you have samples of migraine med that she can try. Please advise.          Created by

## 2010-12-03 NOTE — Telephone Encounter (Signed)
Pt calls stating she has been getting more frequent HAs and thinks they may be migraines bc she has photophobia, phonophobia and nausea. Pt would like to know if you have samples of migraine med that she can try. Please advise.

## 2010-12-03 NOTE — Telephone Encounter (Signed)
Pt aware of the above  

## 2010-12-08 ENCOUNTER — Ambulatory Visit (INDEPENDENT_AMBULATORY_CARE_PROVIDER_SITE_OTHER): Payer: Managed Care, Other (non HMO) | Admitting: Family Medicine

## 2010-12-08 DIAGNOSIS — Z2911 Encounter for prophylactic immunotherapy for respiratory syncytial virus (RSV): Secondary | ICD-10-CM

## 2010-12-08 DIAGNOSIS — Z23 Encounter for immunization: Secondary | ICD-10-CM

## 2010-12-10 ENCOUNTER — Other Ambulatory Visit: Payer: Self-pay | Admitting: *Deleted

## 2010-12-10 DIAGNOSIS — E119 Type 2 diabetes mellitus without complications: Secondary | ICD-10-CM

## 2010-12-10 DIAGNOSIS — M1990 Unspecified osteoarthritis, unspecified site: Secondary | ICD-10-CM

## 2010-12-10 DIAGNOSIS — K219 Gastro-esophageal reflux disease without esophagitis: Secondary | ICD-10-CM

## 2010-12-10 MED ORDER — METFORMIN HCL 850 MG PO TABS: 850.0000 mg | ORAL_TABLET | Freq: Two times a day (BID) | ORAL | Status: DC

## 2010-12-10 MED ORDER — ESOMEPRAZOLE MAGNESIUM 40 MG PO CPDR: 40.0000 mg | DELAYED_RELEASE_CAPSULE | Freq: Every day | ORAL | Status: DC

## 2010-12-10 MED ORDER — CELECOXIB 200 MG PO CAPS: 200.0000 mg | ORAL_CAPSULE | Freq: Every day | ORAL | Status: DC

## 2010-12-10 MED ORDER — ATORVASTATIN CALCIUM 40 MG PO TABS: 40.0000 mg | ORAL_TABLET | Freq: Every day | ORAL | Status: DC

## 2010-12-11 ENCOUNTER — Telehealth: Payer: Self-pay | Admitting: Family Medicine

## 2010-12-11 NOTE — Telephone Encounter (Signed)
Meds were sent to [facility] yest

## 2010-12-11 NOTE — Telephone Encounter (Signed)
Patient called left voice mail about 4 prescriptions. Pt states she has been out of medicine since Monday and out of metformin.Patient request a call back

## 2010-12-17 ENCOUNTER — Telehealth: Payer: Self-pay | Admitting: *Deleted

## 2010-12-17 DIAGNOSIS — E785 Hyperlipidemia, unspecified: Secondary | ICD-10-CM

## 2010-12-17 DIAGNOSIS — E119 Type 2 diabetes mellitus without complications: Secondary | ICD-10-CM

## 2010-12-17 DIAGNOSIS — E039 Hypothyroidism, unspecified: Secondary | ICD-10-CM

## 2010-12-17 NOTE — Telephone Encounter (Signed)
Pt states she thinks she is due for labs bc she never went for them when she was given the order. Her sugar has been running high as well and states that she would like to have this checked as well. Please advise.

## 2010-12-17 NOTE — Telephone Encounter (Signed)
Pt states she needs an LDL as well. OK per Dr Cathey Endow.

## 2010-12-17 NOTE — Telephone Encounter (Signed)
pls add Liver profile as well. Dx hyperlipidemia.

## 2010-12-17 NOTE — Telephone Encounter (Signed)
Pt advised of lab order faxed. Pt states she thought she was to have her LDL re-checked as well. Has a paper stating.

## 2010-12-25 ENCOUNTER — Telehealth: Payer: Self-pay | Admitting: Family Medicine

## 2010-12-25 ENCOUNTER — Ambulatory Visit: Payer: Managed Care, Other (non HMO) | Admitting: Family Medicine

## 2010-12-25 MED ORDER — PREDNISONE 20 MG PO TABS: ORAL_TABLET | ORAL | Status: DC

## 2010-12-25 NOTE — Telephone Encounter (Signed)
I sent her another round of prednisone for recurrent rash but if this doesn't help, she'll need to get back in with derm.

## 2010-12-25 NOTE — Telephone Encounter (Signed)
Pt will cancel apt and she would like med refilled for rash.

## 2010-12-25 NOTE — Telephone Encounter (Signed)
Yes, I can RF this for her OR she can get back in with derm.

## 2010-12-25 NOTE — Telephone Encounter (Signed)
Patient called advised that her rash has come back and request to know if she can just have a prescription for it instead of coming in. Patient states she came in about 1 month ago for same rash and it has come back. Pt req a call back to adv

## 2010-12-29 ENCOUNTER — Telehealth: Payer: Self-pay | Admitting: Family Medicine

## 2010-12-29 MED ORDER — ALPRAZOLAM 1 MG PO TABS: 1.0000 mg | ORAL_TABLET | Freq: Two times a day (BID) | ORAL | Status: DC | PRN

## 2010-12-29 NOTE — Telephone Encounter (Signed)
Pharmacy called and said they received faxed form for xanex without sig.  Rx not in system and placed electronically, and will get Dr. Cathey Endow to sign, and then will fax to pharm. Jarvis Newcomer, LPN [facility]

## 2011-01-01 ENCOUNTER — Telehealth: Payer: Self-pay | Admitting: *Deleted

## 2011-01-01 DIAGNOSIS — E039 Hypothyroidism, unspecified: Secondary | ICD-10-CM

## 2011-01-01 DIAGNOSIS — E785 Hyperlipidemia, unspecified: Secondary | ICD-10-CM

## 2011-01-01 DIAGNOSIS — E119 Type 2 diabetes mellitus without complications: Secondary | ICD-10-CM

## 2011-01-01 NOTE — Telephone Encounter (Signed)
Labs ordered.

## 2011-01-02 ENCOUNTER — Telehealth: Payer: Self-pay | Admitting: Family Medicine

## 2011-01-02 LAB — TSH: TSH: 0.2 u[IU]/mL — ABNORMAL LOW (ref 0.350–4.500)

## 2011-01-02 LAB — HEPATIC FUNCTION PANEL
AST: 21 U/L (ref 0–37)
Albumin: 4.1 g/dL (ref 3.5–5.2)
Bilirubin, Direct: 0.1 mg/dL (ref 0.0–0.3)
Total Bilirubin: 0.3 mg/dL (ref 0.3–1.2)

## 2011-01-02 LAB — BASIC METABOLIC PANEL WITH GFR
Calcium: 9.7 mg/dL (ref 8.4–10.5)
Chloride: 103 mEq/L (ref 96–112)
Creat: 0.72 mg/dL (ref 0.50–1.10)
GFR, Est Non African American: >60 mL/min (ref >60)
Sodium: 140 mEq/L (ref 135–145)

## 2011-01-02 LAB — HEMOGLOBIN A1C: Mean Plasma Glucose: 131 mg/dL — ABNORMAL HIGH (ref <117)

## 2011-01-02 LAB — LDL CHOLESTEROL, DIRECT: Direct LDL: 84 mg/dL

## 2011-01-02 MED ORDER — LEVOTHYROXINE SODIUM 112 MCG PO TABS: 112.0000 ug | ORAL_TABLET | Freq: Every day | ORAL | Status: DC

## 2011-01-02 NOTE — Telephone Encounter (Signed)
Pls let pt know that we need to lower her dose of levothyroxine.  Her cholesterol and liver function look great.  Kidney function is normal.  Sugar remains in the pre- diabetic range.  Will send over new dose of levothyroxine.  Recheck TSH in 8 wks.

## 2011-01-05 NOTE — Telephone Encounter (Signed)
Pt notified of results and MD instructions. KJ LPN 

## 2011-01-07 ENCOUNTER — Encounter: Payer: Self-pay | Admitting: *Deleted

## 2011-01-07 ENCOUNTER — Ambulatory Visit (INDEPENDENT_AMBULATORY_CARE_PROVIDER_SITE_OTHER): Payer: Managed Care, Other (non HMO) | Admitting: Family Medicine

## 2011-01-07 ENCOUNTER — Encounter: Payer: Self-pay | Admitting: Family Medicine

## 2011-01-07 ENCOUNTER — Telehealth: Payer: Self-pay | Admitting: Family Medicine

## 2011-01-07 DIAGNOSIS — G894 Chronic pain syndrome: Secondary | ICD-10-CM

## 2011-01-07 DIAGNOSIS — E039 Hypothyroidism, unspecified: Secondary | ICD-10-CM

## 2011-01-07 MED ORDER — RAMIPRIL 2.5 MG PO CAPS: 2.5000 mg | ORAL_CAPSULE | Freq: Every day | ORAL | Status: DC

## 2011-01-07 MED ORDER — OXYCODONE HCL 15 MG PO TABS: 15.0000 mg | ORAL_TABLET | ORAL | Status: DC | PRN

## 2011-01-07 NOTE — Progress Notes (Signed)
  Subjective:    Patient ID: [name], female    DOB: February 22, 1951, 60 y.o.   MRN: [id number]  HPI  60 yo WF presents f/u visit.  She just had her labs updated on 8-3.  She has seen [facility] for her ongoing back pain.  She sees Dr Darrelyn Hillock who disagreed with the procedure Dr Shon Baton wanted to do per her report.  Dr Ethelene Hal has been prescribing her Oxycodone.  Cymbalta did help her pain in the hands.  She had to sign a contract with Dr Ethelene Hal and didn't realized that he had given her Oxycontin.  She did contact him and let him know that she did not want to stay on that.  She would like to go back on short acting oxycodone like she was on before that did work fairly well.  BP 150/92  Pulse 115  Ht 5\' 4"  (1.626 m)  Wt 133 lb (60.328 kg)  BMI 22.83 kg/m2  SpO2 100%   Review of Systems  Gastrointestinal: Negative for constipation.  Musculoskeletal: Positive for myalgias, back pain, joint swelling and arthralgias.       Objective:   Physical Exam  Constitutional: She appears well-developed and well-nourished.  Cardiovascular: Normal rate, regular rhythm and normal heart sounds.   Pulmonary/Chest: She has wheezes.       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          Assessment & Plan:

## 2011-01-07 NOTE — Assessment & Plan Note (Signed)
Chronic Lumbar and Cervical DDD, seeing Dr Netta Corrigan and Dr Ethelene Hal.  Unhappy on Oxycontin for short period of time.  I called and spoke to the pharmacist at [facility] and told him that she was OFF oxycontin and that I would be filling (under contract) oxydocone 15 mg q 4-6 hrs prn #120/ month.  Pt signed contract here today and understands the plan.

## 2011-01-07 NOTE — Patient Instructions (Signed)
Sign narcotic contract for Oxycodone 15 mg q 4-6 hrs #120/ month. I called [facility] to [employer].  Recheck TSH in 2 mos.  Return for f/u COPD/ thyroid/ flu shot  in 2 mos.

## 2011-01-07 NOTE — Assessment & Plan Note (Signed)
She did pick up the lower dose of thyroid medication.  Repeat TSH in 8 wks.

## 2011-01-15 ENCOUNTER — Encounter: Payer: Self-pay | Admitting: Emergency Medicine

## 2011-01-15 ENCOUNTER — Inpatient Hospital Stay (INDEPENDENT_AMBULATORY_CARE_PROVIDER_SITE_OTHER)
Admission: RE | Admit: 2011-01-15 | Discharge: 2011-01-15 | Disposition: A | Discharge status: Home / Self Care | Payer: Managed Care, Other (non HMO) | Source: Ambulatory Visit | Attending: Emergency Medicine | Admitting: Emergency Medicine

## 2011-01-15 DIAGNOSIS — L2089 Other atopic dermatitis: Secondary | ICD-10-CM | POA: Insufficient documentation

## 2011-01-19 ENCOUNTER — Telehealth (INDEPENDENT_AMBULATORY_CARE_PROVIDER_SITE_OTHER): Payer: Self-pay | Admitting: *Deleted

## 2011-01-25 NOTE — Telephone Encounter (Signed)
Cloesed.

## 2011-01-26 ENCOUNTER — Telehealth: Payer: Self-pay | Admitting: Family Medicine

## 2011-01-26 NOTE — Telephone Encounter (Signed)
Closed

## 2011-01-27 ENCOUNTER — Telehealth: Payer: Self-pay | Admitting: Family Medicine

## 2011-01-27 ENCOUNTER — Ambulatory Visit: Payer: Managed Care, Other (non HMO) | Admitting: Family Medicine

## 2011-01-27 NOTE — Telephone Encounter (Signed)
Pt calling and feels too bad to make her appt today.  Calling and needs to reschedule to tomorrow.  Her head hurst so bad she can't drive herself so she rescheduled to tomorrow at 11:30 am so her son will be able to bring her. Routed to the provider/CMA as FYI message. Jarvis Newcomer, LPN [facility]

## 2011-01-28 ENCOUNTER — Encounter: Payer: Self-pay | Admitting: Family Medicine

## 2011-01-28 ENCOUNTER — Telehealth: Payer: Self-pay | Admitting: Family Medicine

## 2011-01-28 ENCOUNTER — Ambulatory Visit (INDEPENDENT_AMBULATORY_CARE_PROVIDER_SITE_OTHER): Payer: Managed Care, Other (non HMO) | Admitting: Family Medicine

## 2011-01-28 DIAGNOSIS — G43909 Migraine, unspecified, not intractable, without status migrainosus: Secondary | ICD-10-CM

## 2011-01-28 DIAGNOSIS — J3489 Other specified disorders of nose and nasal sinuses: Secondary | ICD-10-CM

## 2011-01-28 MED ORDER — FLUTICASONE PROPIONATE 50 MCG/ACT NA SUSP: 2.0000 | Freq: Every day | NASAL | Status: DC

## 2011-01-28 MED ORDER — BUTALBITAL-ACETAMINOPHEN 50-650 MG PO TABS: 1.0000 | ORAL_TABLET | Freq: Four times a day (QID) | ORAL | Status: DC | PRN

## 2011-01-28 MED ORDER — ALPRAZOLAM 1 MG PO TABS: 1.0000 mg | ORAL_TABLET | Freq: Two times a day (BID) | ORAL | Status: DC | PRN

## 2011-01-28 NOTE — Progress Notes (Signed)
  Subjective:    Patient ID: [name], female    DOB: Jun 17, 1950, 60 y.o.   MRN: [id number]  HPI HA frontal and over nasal bridge for one week.  Radiating into the back of her head. 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  Sister visited for 3 weeks and had MRSA.  She is worried because she had a knee replacement 3 years ago. No cough. No congestion.  Light sensitivity today.  Says using the netipot, mucinex, clariting, sudafed, and flonase and says this usually works but not this time.  No fever.  Sinus pressure. Hx of recurrent sinusitis.    Review of Systems     BP 136/83  Pulse 91  Temp(Src) 99.3 F (37.4 C) (Oral)  Wt 133 lb (60.328 kg)    Allergies  Allergen Reactions  . Advair Hfa   . Codeine   . Fentanyl     REACTION: severe aggitation, mood changes  . Symbicort     REACTION: rash  . Tramadol-Acetaminophen     No past medical history on file.  No past surgical history on file.  History   Social History  . Marital Status: Married    Spouse Name: N/A    Number of Children: N/A  . Years of Education: N/A   Occupational History  . Not on file.   Social History Main Topics  . Smoking status: Current Everyday Smoker -- 1.0 packs/day    Types: Cigarettes  . Smokeless tobacco: Not on file  . Alcohol Use: Not on file  . Drug Use: Not on file  . Sexually Active: Not on file   Other Topics Concern  . Not on file   Social History Narrative  . No narrative on file    No family history on file.  Ms. Tauer had no medications administered during this visit.  Objective:   Physical Exam  Constitutional: She is oriented to person, place, and time. She appears well-developed and well-nourished.  HENT:  Head: Normocephalic and atraumatic.  Right Ear: External ear normal.  Left Ear: External ear normal.  Nose: Nose normal.  Mouth/Throat: Oropharynx is clear and moist.       TMs and canals are clear.   Eyes: Conjunctivae and EOM are normal. Pupils are equal, round,  and reactive to light.  Neck: Neck supple. No thyromegaly present.  Cardiovascular: Normal rate, regular rhythm and normal heart sounds.   Pulmonary/Chest: Effort normal and breath sounds normal. She has no wheezes.  Lymphadenopathy:    She has no cervical adenopathy.  Neurological: She is alert and oriented to person, place, and time.  Skin: Skin is warm and dry.  Psychiatric: She has a normal mood and affect. Her behavior is normal.          Assessment & Plan:  Sinus pain -  I think her pain is more consistent with a migraine versus a sinus infection. At this point in time I did give her some samples of Bupap to try adn see if it helps her HA. I dont thin she is a great candidate for tryptans. Call tomorrow if not feeling better adn will consider treating for a sinus infection since she does have a hx of recurrnet sinusitis. She has tried allergy meds and they aren't helping.

## 2011-01-28 NOTE — Telephone Encounter (Signed)
She needs to give it through tomorrow.

## 2011-01-28 NOTE — Telephone Encounter (Signed)
Pt notified to give another 24-48 hours to see if the medication for the headaches is going to work. Pt voiced understanding and # 6 samples of bupap were placed up front for pt to pup. Jarvis Newcomer, LPN [facility] Dimes

## 2011-01-28 NOTE — Telephone Encounter (Signed)
Pt called and she said she was seen yesterday and given a med for the headaches called orbiban 50 mg PO and told she could take every 4-6 hours prn for the H/A's.  Pt took 1 pill today at 1:00pm, then repeated one pill @ 3:00pm.  Not much relief from the H/A.   Plan:  Reviewed the note from the recent office visit.  Dr. Linford Arnold said for pt to call back if not relief of H/A.  It says will consider treating with antibiotic since hx of sinus infections.  Please advise. Routed to Dr. Marlyne Beards, LPN [facility]

## 2011-02-02 ENCOUNTER — Encounter: Payer: Self-pay | Admitting: Family Medicine

## 2011-02-02 ENCOUNTER — Telehealth: Payer: Self-pay | Admitting: *Deleted

## 2011-02-02 MED ORDER — AMOXICILLIN-POT CLAVULANATE 875-125 MG PO TABS: 1.0000 | ORAL_TABLET | Freq: Two times a day (BID) | ORAL | Status: AC

## 2011-02-02 NOTE — Telephone Encounter (Signed)
Pt aware of the above  

## 2011-02-02 NOTE — Telephone Encounter (Signed)
Pt didn't have MRSA> She was around her sister who had it (exposure) so if he feels she needs the prophylactically then he needs to rx.  If she has a lesion that has appeared over the weekend then make appt so we can culture the lesion. I will call in ABX for her sinuses.

## 2011-02-02 NOTE — Telephone Encounter (Signed)
Pt calls stating her Ortho recommended her to be on Doxycycline for MRSA and knee replacement he requests that you Rx bc he doesn't Rx except for after procedures. Also migraine meds are not helping and Pt states she needs ABX for sinusitis. Please advise.

## 2011-02-04 ENCOUNTER — Other Ambulatory Visit: Payer: Self-pay | Admitting: Family Medicine

## 2011-02-04 MED ORDER — OXYCODONE HCL 15 MG PO TABS: 15.0000 mg | ORAL_TABLET | ORAL | Status: DC | PRN

## 2011-02-04 NOTE — Telephone Encounter (Signed)
Pt called and needs refill of her oxycodone 15 mg.  Seen recently on 01-26-11.  Last refill was on 01-07-11. Plan:  Due 02-07-11, but will fall on the weekend so will date script accordingly so pt can pup on 02-05-11 for 30 day script.

## 2011-02-15 ENCOUNTER — Other Ambulatory Visit: Payer: Self-pay | Admitting: Orthopedic Surgery

## 2011-02-15 DIAGNOSIS — IMO0002 Reserved for concepts with insufficient information to code with codable children: Secondary | ICD-10-CM

## 2011-02-22 ENCOUNTER — Other Ambulatory Visit: Payer: Self-pay | Admitting: Neurosurgery

## 2011-02-22 ENCOUNTER — Ambulatory Visit
Admission: RE | Admit: 2011-02-22 | Discharge: 2011-02-22 | Disposition: A | Discharge status: Home / Self Care | Payer: Managed Care, Other (non HMO) | Source: Ambulatory Visit | Attending: Neurosurgery | Admitting: Neurosurgery

## 2011-02-22 DIAGNOSIS — R229 Localized swelling, mass and lump, unspecified: Secondary | ICD-10-CM

## 2011-02-23 ENCOUNTER — Ambulatory Visit (INDEPENDENT_AMBULATORY_CARE_PROVIDER_SITE_OTHER): Payer: Managed Care, Other (non HMO) | Admitting: Family Medicine

## 2011-02-23 ENCOUNTER — Encounter: Payer: Self-pay | Admitting: Family Medicine

## 2011-02-23 DIAGNOSIS — J9801 Acute bronchospasm: Secondary | ICD-10-CM

## 2011-02-23 DIAGNOSIS — J449 Chronic obstructive pulmonary disease, unspecified: Secondary | ICD-10-CM

## 2011-02-23 DIAGNOSIS — J4489 Other specified chronic obstructive pulmonary disease: Secondary | ICD-10-CM

## 2011-02-23 DIAGNOSIS — J40 Bronchitis, not specified as acute or chronic: Secondary | ICD-10-CM

## 2011-02-23 DIAGNOSIS — R062 Wheezing: Secondary | ICD-10-CM

## 2011-02-23 MED ORDER — LEVOFLOXACIN 750 MG PO TABS: 750.0000 mg | ORAL_TABLET | Freq: Every day | ORAL | Status: DC

## 2011-02-23 MED ORDER — CHLORPHENIRAMINE-HYDROCODONE 8-10 MG/5ML PO LQCR: 5.0000 mL | Freq: Two times a day (BID) | ORAL | Status: DC | PRN

## 2011-02-23 MED ORDER — METHYLPREDNISOLONE SODIUM SUCC 125 MG IJ SOLR
125.0000 mg | Freq: Once | INTRAMUSCULAR | Status: AC
  Administered 2011-02-23: 125 mg via INTRAMUSCULAR

## 2011-02-23 MED ORDER — SODIUM CHLORIDE 0.9 % IV SOLN: 125.0000 mg | Freq: Once | INTRAVENOUS | Status: DC

## 2011-02-23 MED ORDER — LEVOFLOXACIN 750 MG PO TABS: 750.0000 mg | ORAL_TABLET | Freq: Every day | ORAL | Status: AC

## 2011-02-23 MED ORDER — ALBUTEROL SULFATE (5 MG/ML) 0.5% IN NEBU
2.5000 mg | INHALATION_SOLUTION | Freq: Once | RESPIRATORY_TRACT | Status: AC
  Administered 2011-02-23: 2.5 mg via RESPIRATORY_TRACT

## 2011-02-23 MED ORDER — IPRATROPIUM BROMIDE 0.02 % IN SOLN
0.5000 mg | Freq: Once | RESPIRATORY_TRACT | Status: AC
  Administered 2011-02-23: 0.5 mg via RESPIRATORY_TRACT

## 2011-02-23 NOTE — Progress Notes (Signed)
  Subjective:    Patient ID: [name], female    DOB: 05-10-1951, 60 y.o.   MRN: [id number]  HPIShe reports exacerbation of her bronchitis for 10 days. She has  aHX of COPD and continues to smoke.     Review of Systems  Constitutional: Positive for fatigue.  Respiratory: Positive for cough, chest tightness, shortness of breath, wheezing and stridor.        Yellowish grey flim is being produced  Cardiovascular: Negative for chest pain.       Objective:   Physical Exam  Constitutional: She appears well-developed.  HENT:  Head: Normocephalic.  Neck: Normal range of motion. Neck supple.  Cardiovascular: Normal rate and regular rhythm.   Pulmonary/Chest: She is in respiratory distress. She has wheezes.  Neurological: She is alert.  Skin: Skin is warm and dry.  Psychiatric: She has a normal mood and affect.          Assessment & Plan:  COPD COPD w/ acute exacerbation Warned need to stop smoking Stop smoking Tussionex 1 tsp po 2 x a day, levaquin 750 day given solumedrol 125 here and duoneb treatment Return in 48-72 hours if not better. Return at next scheduled appointment.

## 2011-02-23 NOTE — Patient Instructions (Addendum)
Stop smoking  Return in 48-72 hours if not better. Return at next scheduled appointment.

## 2011-02-24 LAB — CBC
HCT: 38.2
Hemoglobin: 12.9
MCHC: 33.8
MCV: 91.9
RDW: 12.1

## 2011-02-24 LAB — COMPREHENSIVE METABOLIC PANEL
BUN: 14
CO2: 30
Calcium: 10
Creatinine, Ser: 0.69
GFR calc non Af Amer: >60
Glucose, Bld: 105 — ABNORMAL HIGH
Total Protein: 7.2

## 2011-02-24 LAB — DIFFERENTIAL
Lymphs Abs: 2.8
Monocytes Relative: 8
Neutro Abs: 3.4
Neutrophils Relative %: 48

## 2011-02-24 LAB — URINE MICROSCOPIC-ADD ON

## 2011-02-24 LAB — URINALYSIS, ROUTINE W REFLEX MICROSCOPIC
Bilirubin Urine: NEGATIVE
Glucose, UA: NEGATIVE
Hgb urine dipstick: NEGATIVE
Specific Gravity, Urine: 1.007
Urobilinogen, UA: 0.2

## 2011-02-24 LAB — URINE CULTURE
Colony Count: NO GROWTH
Special Requests: NEGATIVE

## 2011-02-24 LAB — TYPE AND SCREEN: Antibody Screen: NEGATIVE

## 2011-02-24 LAB — PROTIME-INR
INR: 0.9
Prothrombin Time: 12.5

## 2011-02-24 LAB — APTT: aPTT: 32

## 2011-02-25 ENCOUNTER — Other Ambulatory Visit: Payer: Self-pay | Admitting: Orthopedic Surgery

## 2011-02-25 LAB — CREATININE, SERUM: Creat: 0.7 mg/dL (ref 0.50–1.10)

## 2011-02-27 ENCOUNTER — Ambulatory Visit
Admission: RE | Admit: 2011-02-27 | Discharge: 2011-02-27 | Disposition: A | Discharge status: Home / Self Care | Payer: Managed Care, Other (non HMO) | Source: Ambulatory Visit | Attending: Orthopedic Surgery | Admitting: Orthopedic Surgery

## 2011-02-27 DIAGNOSIS — IMO0002 Reserved for concepts with insufficient information to code with codable children: Secondary | ICD-10-CM

## 2011-02-27 MED ORDER — GADOBENATE DIMEGLUMINE 529 MG/ML IV SOLN: 12.0000 mL | Freq: Once | INTRAVENOUS | Status: AC | PRN

## 2011-03-01 ENCOUNTER — Telehealth: Payer: Self-pay | Admitting: Family Medicine

## 2011-03-01 DIAGNOSIS — J40 Bronchitis, not specified as acute or chronic: Secondary | ICD-10-CM

## 2011-03-01 NOTE — Telephone Encounter (Signed)
CXR order entered.  Pt can go same day.  Pt notified and can go at her convenience.   Plan:  Pt informed. Jarvis Newcomer, LPN [facility] Dimes

## 2011-03-01 NOTE — Telephone Encounter (Signed)
Let get a CXR then.

## 2011-03-01 NOTE — Telephone Encounter (Signed)
Pt called in this am and is complaining of still not feeling no better.  Seen last week by Dr. Thurmond Butts, and given levaquin, cough med, and received steroid injection here in the office.  Please advise as last office note says for pt to follow up after 72 hours if not better.  Pt C/O cough, fever 101.0 yest, stuffy and hard to breathe nasally. Plan:  Routed to Dr. Marlyne Beards, LPN [facility] Dimes

## 2011-03-02 ENCOUNTER — Telehealth: Payer: Self-pay | Admitting: Family Medicine

## 2011-03-02 NOTE — Telephone Encounter (Signed)
Pt called and LMOM for the front staff voice line telling them that she is feeling better today and we had set up an CXR, and she wants it cancelled since she is doing much better today.  Has appt with Dr. Linford Arnold on Friday and if feels needs then, will have Dr. Linford Arnold re-order. Melissa Newcomer, LPN [facility]imes

## 2011-03-02 NOTE — Telephone Encounter (Signed)
OK to cancel order.

## 2011-03-03 NOTE — Telephone Encounter (Signed)
Acknowledged. Makhari Dovidio, LPN /Triage  

## 2011-03-05 ENCOUNTER — Ambulatory Visit (INDEPENDENT_AMBULATORY_CARE_PROVIDER_SITE_OTHER): Payer: Managed Care, Other (non HMO) | Admitting: Family Medicine

## 2011-03-05 ENCOUNTER — Encounter: Payer: Self-pay | Admitting: Family Medicine

## 2011-03-05 VITALS — BP 159/88 | HR 104 | Wt 132.0 lb

## 2011-03-05 DIAGNOSIS — Z23 Encounter for immunization: Secondary | ICD-10-CM

## 2011-03-05 DIAGNOSIS — Z1211 Encounter for screening for malignant neoplasm of colon: Secondary | ICD-10-CM

## 2011-03-05 DIAGNOSIS — M810 Age-related osteoporosis without current pathological fracture: Secondary | ICD-10-CM

## 2011-03-05 DIAGNOSIS — M4802 Spinal stenosis, cervical region: Secondary | ICD-10-CM

## 2011-03-05 DIAGNOSIS — M2550 Pain in unspecified joint: Secondary | ICD-10-CM

## 2011-03-05 MED ORDER — OXYCODONE HCL 15 MG PO TABS: 15.0000 mg | ORAL_TABLET | ORAL | Status: DC | PRN

## 2011-03-05 MED ORDER — LISINOPRIL 5 MG PO TABS: 5.0000 mg | ORAL_TABLET | Freq: Every day | ORAL | Status: DC

## 2011-03-05 MED ORDER — DICLOFENAC SODIUM 1 % TD GEL: 1.0000 "application " | Freq: Four times a day (QID) | TRANSDERMAL | Status: DC

## 2011-03-05 MED ORDER — ZOLEDRONIC ACID 5 MG/100ML IV SOLN: 5.0000 mg | Freq: Once | INTRAVENOUS | Status: DC

## 2011-03-05 NOTE — Progress Notes (Signed)
  Subjective:    Patient ID: [name], female    DOB: 1951-05-08, 60 y.o.   MRN: [id number]  HPI She is here today to discuss several days she is: Arthritis-Would like a Rx for volteran gel which really helps her joint pain.  Says has been told she needs to see a rhuematologist. She says she'll discuss this with Dr. Lawerance Bach in the past had recommended at one time. Patient says she was unable to go at that time and so he put it off but would like a referral now. She does take chronic pain medications and says that she would like to increase her dose by half a tab a day. She says especially in the morning that seems to be her worst time. She is on a pain contract. Brother with gout. Mother with RA.    She would like to try to quit smoking again.  She says she plans to try one more time. She has really tried to quit multiple times.  She needs to be scheduled for mammogram.  She wants to wait until Dec for her pap smear.    She is also due for another reconstitution. Her last one was in August of 2011 which she had been at [facility].  She is recovering for a COPD exacerbation and bronchitis. She is feeling some better. She is taking her antibiotic.  Review of Systems     Objective:   Physical Exam  Constitutional: She is oriented to person, place, and time. She appears well-developed and well-nourished.  HENT:  Head: Normocephalic and atraumatic.  Cardiovascular: Normal rate, regular rhythm and normal heart sounds.   Pulmonary/Chest: Effort normal and breath sounds normal.       Productive sounding cough. No wheezing today.  Neurological: She is alert and oriented to person, place, and time.  Skin: Skin is warm and dry.  Psychiatric: She has a normal mood and affect. Her behavior is normal.          Assessment & Plan:  Arthritis - Rf her volteran gel and will refer to rheumatology. I did increase her medication by half a tab a day which is 15 extra times per month. We  had a discussion that she might benefit from a longer acting medications and consider a pain management referral. If she feels she needs an increased dose above which she is now getting then she will need to have a pain management referral. Patient understands.  Osteoporosis-Needs another reclast infusion at [facility].   Schedule for mammogram and colonoscopy.  Bronchitis and COPD exacerbation-she seems to be improving he is making progress.  Flu vaccine given today.

## 2011-03-05 NOTE — Assessment & Plan Note (Signed)
Pt still c/o of pain in the AMs and would like to inc her pain med by half a tab a day ( 15 tabs per mo). Ok to increase but if feels need more pain control then will need refer to pain management. Also discussed considering longer acting meds that pain management would be able to help her with.

## 2011-03-05 NOTE — Patient Instructions (Signed)
Work on a low salt diet.

## 2011-03-08 ENCOUNTER — Telehealth: Payer: Self-pay | Admitting: Family Medicine

## 2011-03-08 DIAGNOSIS — M2550 Pain in unspecified joint: Secondary | ICD-10-CM

## 2011-03-08 NOTE — Telephone Encounter (Signed)
Patient wants to know, instead of her going to Rheumatology appointment, can Dr. Linford Arnold just order the labs to see if she has this? Patient is thinking she may just have bad osteo?? Please call patient back and let her know what the doctor said.

## 2011-03-09 NOTE — Telephone Encounter (Signed)
Order some lab work. Can go to lab anytime. Can start with this.

## 2011-03-09 NOTE — Telephone Encounter (Signed)
Left message on vm

## 2011-03-12 LAB — ANA: Anti Nuclear Antibody(ANA): NEGATIVE

## 2011-03-12 LAB — CBC WITH DIFFERENTIAL/PLATELET
Basophils Absolute: 0 10*3/uL (ref 0.0–0.1)
Basophils Relative: 0 % (ref 0–1)
Eosinophils Absolute: 0.5 10*3/uL (ref 0.0–0.7)
Hemoglobin: 12.2 g/dL (ref 12.0–15.0)
MCH: 30 pg (ref 26.0–34.0)
MCHC: 32.1 g/dL (ref 30.0–36.0)
Monocytes Relative: 7 % (ref 3–12)
Neutro Abs: 4.9 10*3/uL (ref 1.7–7.7)
Neutrophils Relative %: 52 % (ref 43–77)
Platelets: 294 10*3/uL (ref 150–400)
RBC: 4.06 MIL/uL (ref 3.87–5.11)

## 2011-03-12 LAB — RHEUMATOID FACTOR: Rhuematoid fact SerPl-aCnc: <10 IU/mL (ref <=14)

## 2011-03-12 LAB — SEDIMENTATION RATE: Sed Rate: 8 mm/hr (ref 0–22)

## 2011-03-17 ENCOUNTER — Telehealth: Payer: Self-pay | Admitting: Family Medicine

## 2011-03-17 ENCOUNTER — Other Ambulatory Visit: Payer: Self-pay | Admitting: Family Medicine

## 2011-03-17 DIAGNOSIS — K219 Gastro-esophageal reflux disease without esophagitis: Secondary | ICD-10-CM

## 2011-03-17 MED ORDER — DULOXETINE HCL 60 MG PO CPEP: 60.0000 mg | ORAL_CAPSULE | Freq: Every day | ORAL | Status: DC

## 2011-03-17 MED ORDER — ESOMEPRAZOLE MAGNESIUM 40 MG PO CPDR: 40.0000 mg | DELAYED_RELEASE_CAPSULE | Freq: Every day | ORAL | Status: DC

## 2011-03-17 NOTE — Telephone Encounter (Signed)
Message copied by Gifford Shave on Wed Mar 17, 2011  1:24 PM ------      Message from: [place], [place] H      Created: Wed Mar 17, 2011  9:24 AM       Pls call and notify pt that her rheumatology lab work was negative.

## 2011-03-17 NOTE — Telephone Encounter (Signed)
Pt informed of neg rheumatoid arthritis labwork. Jarvis Newcomer, LPN [facility]

## 2011-03-17 NOTE — Telephone Encounter (Signed)
[facility] called for refills of pt medications.  Cymbalta 60 mg and nexium 40 mg. Plan:  Refilled both meds and sent electronically. Jarvis Newcomer, LPN [facility]

## 2011-03-18 ENCOUNTER — Other Ambulatory Visit: Payer: Self-pay | Admitting: *Deleted

## 2011-03-18 DIAGNOSIS — K219 Gastro-esophageal reflux disease without esophagitis: Secondary | ICD-10-CM

## 2011-03-18 MED ORDER — DULOXETINE HCL 60 MG PO CPEP: 60.0000 mg | ORAL_CAPSULE | Freq: Every day | ORAL | Status: DC

## 2011-03-18 MED ORDER — ESOMEPRAZOLE MAGNESIUM 40 MG PO CPDR: 40.0000 mg | DELAYED_RELEASE_CAPSULE | Freq: Every day | ORAL | Status: DC

## 2011-03-29 ENCOUNTER — Other Ambulatory Visit: Payer: Self-pay | Admitting: *Deleted

## 2011-03-29 MED ORDER — ALPRAZOLAM 1 MG PO TABS: 1.0000 mg | ORAL_TABLET | Freq: Two times a day (BID) | ORAL | Status: DC | PRN

## 2011-04-01 ENCOUNTER — Encounter: Payer: Self-pay | Admitting: Family Medicine

## 2011-04-01 ENCOUNTER — Ambulatory Visit (INDEPENDENT_AMBULATORY_CARE_PROVIDER_SITE_OTHER): Payer: Managed Care, Other (non HMO) | Admitting: Family Medicine

## 2011-04-01 VITALS — BP 132/77 | HR 108 | Ht 64.0 in | Wt 134.0 lb

## 2011-04-01 DIAGNOSIS — F17200 Nicotine dependence, unspecified, uncomplicated: Secondary | ICD-10-CM

## 2011-04-01 DIAGNOSIS — R519 Headache, unspecified: Secondary | ICD-10-CM

## 2011-04-01 DIAGNOSIS — Z720 Tobacco use: Secondary | ICD-10-CM

## 2011-04-01 DIAGNOSIS — I10 Essential (primary) hypertension: Secondary | ICD-10-CM

## 2011-04-01 DIAGNOSIS — R51 Headache: Secondary | ICD-10-CM

## 2011-04-01 DIAGNOSIS — G8929 Other chronic pain: Secondary | ICD-10-CM

## 2011-04-01 MED ORDER — VARENICLINE TARTRATE 0.5 MG PO TABS: 0.5000 mg | ORAL_TABLET | Freq: Two times a day (BID) | ORAL | Status: AC

## 2011-04-01 MED ORDER — VARENICLINE TARTRATE 1 MG PO TABS: 1.0000 mg | ORAL_TABLET | Freq: Two times a day (BID) | ORAL | Status: AC

## 2011-04-01 MED ORDER — OXYCODONE HCL 15 MG PO TABS: 22.5000 mg | ORAL_TABLET | Freq: Four times a day (QID) | ORAL | Status: DC | PRN

## 2011-04-01 NOTE — Progress Notes (Signed)
Subjective:    Patient ID: [name], female    DOB: [birth year], 60 y.o.   MRN: [id number]  Headache  This is a chronic problem. The current episode started more than 1 year ago. The problem has been gradually worsening. The pain quality is similar to prior headaches. The quality of the pain is described as stabbing and throbbing (temporal and top of eyes). The pain is at a severity of 7/10. The pain is moderate (headache trumps knee pain). Associated symptoms include back pain and neck pain. Pertinent negatives include no blurred vision, ear pain, eye pain, insomnia, nausea, visual change, vomiting or weight loss. The symptoms are aggravated by bright light and noise. She has tried oral narcotics for the symptoms. The treatment provided significant relief. Her past medical history is significant for hypertension, migraine headaches and migraines in the family.  Hypertension This is a new problem. The current episode started more than 1 month ago. The problem has been rapidly improving since onset. The problem is controlled. Associated symptoms include headaches and neck pain. Pertinent negatives include no blurred vision. The current treatment provides mild improvement. There are no compliance problems.   #3 smoking cessation talk with the stop smoking. She has used Chantix before in the past. And she wants to give it a try again. We talked about the importance of reducing smoking to help with her headaches and hypertension. #4 chronic pain she has a history of chronic pain from multiple areas of arthritis shoulder pain hip pain knee pain back pain. She tried to reduce her oxycodone to one tablet 4 times a day for constantly take 1-1/2 tablets. She's been out of oxycodone she'll extensive discuss was she overuses her oxycodone the future. per her her prescription.  Review of Systems  Constitutional: Negative for weight loss.  HENT: Positive for neck pain. Negative for ear pain.   Eyes: Negative  for blurred vision and pain.  Gastrointestinal: Negative for nausea and vomiting.  Musculoskeletal: Positive for back pain.  Neurological: Positive for headaches.  Psychiatric/Behavioral: The patient does not have insomnia.       BP 132/77  Pulse 108  Ht 5\' 4"  (1.626 m)  Wt 134 lb (60.782 kg)  BMI 23.00 kg/m2  Objective:   Physical Exam  Constitutional: She is oriented to person, place, and time. She appears well-developed and well-nourished.  HENT:  Head: Normocephalic.  Neck: Neck supple.  Cardiovascular: Normal rate and regular rhythm.   Pulmonary/Chest: Effort normal and breath sounds normal.  Neurological: She is alert and oriented to person, place, and time.  Skin: Skin is warm and dry.  Psychiatric: Her mood appears anxious. She exhibits a depressed mood.          Assessment & Plan:  #1 smoking cessation we'll renew  her  Chantix #2 for her headache we will evaluate more which reaches the month we did talk about some issues about #3 with hypertension since she didn't take her blood pressure medicine her headaches did improve hopefully by doing a few things like wearing her glasses more often stopping smoking dosage may help her headache. For right now we'll she's not sure she has hypertension I feel that she does and I will continue on the Prinivil with low dose 5 mg a day. #4 chronic pain we're going to give her prescription for the os oxycodone 15 mg one and a half tablet was here back in a month and follow strict protocol how she is using it n  the future.

## 2011-04-01 NOTE — Patient Instructions (Signed)
Smoking Cessation This document explains the best ways for you to quit smoking and new treatments to help. It lists new medicines that can double or triple your chances of quitting and quitting for good. It also considers ways to avoid relapses and concerns you may have about quitting, including weight gain. NICOTINE: A POWERFUL ADDICTION If you have tried to quit smoking, you know how hard it can be. It is hard because nicotine is a very addictive drug. For some people, it can be as addictive as heroin or cocaine. Usually, people make 2 or 3 tries, or more, before finally being able to quit. Each time you try to quit, you can learn about what helps and what hurts. Quitting takes hard work and a lot of effort, but you can quit smoking. QUITTING SMOKING IS ONE OF THE MOST IMPORTANT THINGS YOU WILL EVER DO.  You will live longer, feel better, and live better.   The impact on your body of quitting smoking is felt almost immediately:   Within 20 minutes, blood pressure decreases. Pulse returns to its normal level.   After 8 hours, carbon monoxide levels in the blood return to normal. Oxygen level increases.   After 24 hours, chance of heart attack starts to decrease. Breath, hair, and body stop smelling like smoke.   After 48 hours, damaged nerve endings begin to recover. Sense of taste and smell improve.   After 72 hours, the body is virtually free of nicotine. Bronchial tubes relax and breathing becomes easier.   After 2 to 12 weeks, lungs can hold more air. Exercise becomes easier and circulation improves.   Quitting will reduce your risk of having a heart attack, stroke, cancer, or lung disease:   After 1 year, the risk of coronary heart disease is cut in half.   After 5 years, the risk of stroke falls to the same as a nonsmoker.   After 10 years, the risk of lung cancer is cut in half and the risk of other cancers decreases significantly.   After 15 years, the risk of coronary heart  disease drops, usually to the level of a nonsmoker.   If you are pregnant, quitting smoking will improve your chances of having a healthy baby.   The people you live with, especially your children, will be healthier.   You will have extra money to spend on things other than cigarettes.  FIVE KEYS TO QUITTING Studies have shown that these 5 steps will help you quit smoking and quit for good. You have the best chances of quitting if you use them together: 1. Get ready.  2. Get support and encouragement.  3. Learn new skills and behaviors.  4. Get medicine to reduce your nicotine addiction and use it correctly.  5. Be prepared for relapse or difficult situations. Be determined to continue trying to quit, even if you do not succeed at first.  1. GET READY  Set a quit date.   Change your environment.   Get rid of ALL cigarettes, ashtrays, matches, and lighters in your home, car, and place of work.   Do not let people smoke in your home.   Review your past attempts to quit. Think about what worked and what did not.   Once you quit, do not smoke. NOT EVEN A PUFF!  2. GET SUPPORT AND ENCOURAGEMENT Studies have shown that you have a better chance of being successful if you have help. You can get support in many ways.  Tell   your family, friends, and coworkers that you are going to quit and need their support. Ask them not to smoke around you.   Talk to your caregivers (doctor, dentist, nurse, pharmacist, psychologist, and/or smoking counselor).   Get individual, group, or telephone counseling and support. The more counseling you have, the better your chances are of quitting. Programs are available at local hospitals and health centers. Call your local health department for information about programs in your area.   Spiritual beliefs and practices may help some smokers quit.   Quit meters are small computer programs online or downloadable that keep track of quit statistics, such as amount  of "quit-time," cigarettes not smoked, and money saved.   Many smokers find one or more of the many self-help books available useful in helping them quit and stay off tobacco.  3. LEARN NEW SKILLS AND BEHAVIORS  Try to distract yourself from urges to smoke. Talk to someone, go for a walk, or occupy your time with a task.   When you first try to quit, change your routine. Take a different route to work. Drink tea instead of coffee. Eat breakfast in a different place.   Do something to reduce your stress. Take a hot bath, exercise, or read a book.   Plan something enjoyable to do every day. Reward yourself for not smoking.   Explore interactive web-based programs that specialize in helping you quit.  4. GET MEDICINE AND USE IT CORRECTLY Medicines can help you stop smoking and decrease the urge to smoke. Combining medicine with the above behavioral methods and support can quadruple your chances of successfully quitting smoking. The U.S. Food and Drug Administration (FDA) has approved 7 medicines to help you quit smoking. These medicines fall into 3 categories.  Nicotine replacement therapy (delivers nicotine to your body without the negative effects and risks of smoking):   Nicotine gum: Available over-the-counter.   Nicotine lozenges: Available over-the-counter.   Nicotine inhaler: Available by prescription.   Nicotine nasal spray: Available by prescription.   Nicotine skin patches (transdermal): Available by prescription and over-the-counter.   Antidepressant medicine (helps people abstain from smoking, but how this works is unknown):   Bupropion sustained-release (SR) tablets: Available by prescription.   Nicotinic receptor partial agonist (simulates the effect of nicotine in your brain):   Varenicline tartrate tablets: Available by prescription.   Ask your caregiver for advice about which medicines to use and how to use them. Carefully read the information on the package.    Everyone who is trying to quit may benefit from using a medicine. If you are pregnant or trying to become pregnant, nursing an infant, you are under age 18, or you smoke fewer than 10 cigarettes per day, talk to your caregiver before taking any nicotine replacement medicines.   You should stop using a nicotine replacement product and call your caregiver if you experience nausea, dizziness, weakness, vomiting, fast or irregular heartbeat, mouth problems with the lozenge or gum, or redness or swelling of the skin around the patch that does not go away.   Do not use any other product containing nicotine while using a nicotine replacement product.   Talk to your caregiver before using these products if you have diabetes, heart disease, asthma, stomach ulcers, you had a recent heart attack, you have high blood pressure that is not controlled with medicine, a history of irregular heartbeat, or you have been prescribed medicine to help you quit smoking.  5. BE PREPARED FOR RELAPSE OR   DIFFICULT SITUATIONS  Most relapses occur within the first 3 months after quitting. Do not be discouraged if you start smoking again. Remember, most people try several times before they finally quit.   You may have symptoms of withdrawal because your body is used to nicotine. You may crave cigarettes, be irritable, feel very hungry, cough often, get headaches, or have difficulty concentrating.   The withdrawal symptoms are only temporary. They are strongest when you first quit, but they will go away within 10 to 14 days.  Here are some difficult situations to watch for:  Alcohol. Avoid drinking alcohol. Drinking lowers your chances of successfully quitting.   Caffeine. Try to reduce the amount of caffeine you consume. It also lowers your chances of successfully quitting.   Other smokers. Being around smoking can make you want to smoke. Avoid smokers.   Weight gain. Many smokers will gain weight when they quit, usually  less than 10 pounds. Eat a healthy diet and stay active. Do not let weight gain distract you from your main goal, quitting smoking. Some medicines that help you quit smoking may also help delay weight gain. You can always lose the weight gained after you quit.   Bad mood or depression. There are a lot of ways to improve your mood other than smoking.  If you are having problems with any of these situations, talk to your caregiver. SPECIAL SITUATIONS AND CONDITIONS Studies suggest that everyone can quit smoking. Your situation or condition can give you a special reason to quit.  Pregnant women/new mothers: By quitting, you protect your baby's health and your own.   Hospitalized patients: By quitting, you reduce health problems and help healing.   Heart attack patients: By quitting, you reduce your risk of a second heart attack.   Lung, head, and neck cancer patients: By quitting, you reduce your chance of a second cancer.   Parents of children and adolescents: By quitting, you protect your children from illnesses caused by secondhand smoke.  QUESTIONS TO THINK ABOUT Think about the following questions before you try to stop smoking. You may want to talk about your answers with your caregiver.  Why do you want to quit?   If you tried to quit in the past, what helped and what did not?   What will be the most difficult situations for you after you quit? How will you plan to handle them?   Who can help you through the tough times? Your family? Friends? Caregiver?   What pleasures do you get from smoking? What ways can you still get pleasure if you quit?  Here are some questions to ask your caregiver:  How can you help me to be successful at quitting?   What medicine do you think would be best for me and how should I take it?   What should I do if I need more help?   What is smoking withdrawal like? How can I get information on withdrawal?  Quitting takes hard work and a lot of effort,  but you can quit smoking. FOR MORE INFORMATION  Smokefree.gov (http://www.smokefree.gov) provides free, accurate, evidence-based information and professional assistance to help support the immediate and long-term needs of people trying to quit smoking. Document Released: 05/11/2001 Document Revised: 01/27/2011 Document Reviewed: 03/03/2009 Exit[employer] Patient Information 2012 [employer]. 

## 2011-04-03 DIAGNOSIS — Z720 Tobacco use: Secondary | ICD-10-CM | POA: Insufficient documentation

## 2011-04-03 DIAGNOSIS — R519 Headache, unspecified: Secondary | ICD-10-CM | POA: Insufficient documentation

## 2011-04-03 DIAGNOSIS — G8929 Other chronic pain: Secondary | ICD-10-CM | POA: Insufficient documentation

## 2011-04-03 DIAGNOSIS — I10 Essential (primary) hypertension: Secondary | ICD-10-CM | POA: Insufficient documentation

## 2011-04-05 ENCOUNTER — Other Ambulatory Visit: Payer: Self-pay | Admitting: *Deleted

## 2011-04-05 DIAGNOSIS — E119 Type 2 diabetes mellitus without complications: Secondary | ICD-10-CM

## 2011-04-05 MED ORDER — GABAPENTIN 600 MG PO TABS: 600.0000 mg | ORAL_TABLET | Freq: Two times a day (BID) | ORAL | Status: DC

## 2011-04-05 MED ORDER — METFORMIN HCL 850 MG PO TABS: 850.0000 mg | ORAL_TABLET | Freq: Two times a day (BID) | ORAL | Status: DC

## 2011-04-12 ENCOUNTER — Telehealth: Payer: Self-pay | Admitting: *Deleted

## 2011-04-12 MED ORDER — DULOXETINE HCL 60 MG PO CPEP: 60.0000 mg | ORAL_CAPSULE | Freq: Every day | ORAL | Status: DC

## 2011-04-12 NOTE — Telephone Encounter (Signed)
Addended by: Nani Gasser D on: 04/12/2011 03:56 PM   Modules accepted: Orders

## 2011-04-12 NOTE — Telephone Encounter (Signed)
Can schedule throught [facility].

## 2011-04-12 NOTE — Telephone Encounter (Signed)
Pt states she is due for Reclast and needs order put in and faxed to [facility]

## 2011-04-26 ENCOUNTER — Other Ambulatory Visit: Payer: Self-pay | Admitting: *Deleted

## 2011-04-26 MED ORDER — LEVOTHYROXINE SODIUM 112 MCG PO TABS: 112.0000 ug | ORAL_TABLET | Freq: Every day | ORAL | Status: DC

## 2011-04-27 ENCOUNTER — Encounter: Payer: Self-pay | Admitting: Family Medicine

## 2011-04-27 ENCOUNTER — Ambulatory Visit (INDEPENDENT_AMBULATORY_CARE_PROVIDER_SITE_OTHER): Payer: Managed Care, Other (non HMO) | Admitting: Family Medicine

## 2011-04-27 VITALS — Ht 64.0 in | Wt 134.0 lb

## 2011-04-27 DIAGNOSIS — E119 Type 2 diabetes mellitus without complications: Secondary | ICD-10-CM

## 2011-04-27 DIAGNOSIS — E039 Hypothyroidism, unspecified: Secondary | ICD-10-CM

## 2011-04-27 DIAGNOSIS — G8929 Other chronic pain: Secondary | ICD-10-CM

## 2011-04-27 MED ORDER — OXYCODONE HCL 15 MG PO TABS: 22.5000 mg | ORAL_TABLET | Freq: Four times a day (QID) | ORAL | Status: DC | PRN

## 2011-04-27 MED ORDER — LEVOTHYROXINE SODIUM 112 MCG PO TABS: 112.0000 ug | ORAL_TABLET | Freq: Every day | ORAL | Status: DC

## 2011-04-27 NOTE — Progress Notes (Signed)
  Subjective:    Patient ID: [name], female    DOB: March 18, 1951, 60 y.o.   MRN: [id number]  HPI Pt not seen. Lab and Rx refill only     Review of Systems     Objective:   Physical Exam        Assessment & Plan:

## 2011-04-27 NOTE — Patient Instructions (Signed)
No new instructions patient had a nurses visit only

## 2011-04-27 NOTE — Telephone Encounter (Signed)
Addended by: Ellsworth Lennox on: 04/27/2011 04:23 PM   Modules accepted: Orders

## 2011-04-28 ENCOUNTER — Ambulatory Visit: Payer: Managed Care, Other (non HMO) | Admitting: Family Medicine

## 2011-05-03 NOTE — Letter (Signed)
Summary: Controlled Substances Contract  [facility]  [address]   [place], [place] [address]   Phone: [phone]  Fax: [phone]    [facility] Controlled Substances Contract         Patient Name: [name] Patient DOB: 24-Jan-1951        Patient MRN:  [id number]        Physician's Name: _________________________________________   Patients must complete this contract before doctors at the [facility] office will be willing to prescribe controlled substances. I understand that: ___1)  I am responsible for my controlled substance medications.  If my prescription is lost, misplaced or stolen, or if I take more than prescribed, my doctor will not write me a new prescription. ___2)  I will not request or accept controlled substances or controlled substance prescriptions from any other doctor or clinic while I am receiving controlled substance treatment at [facility].  The ONLY exception is if controlled substances are prescribed for the treatment of an acute condition that is NOT the diagnosis for which I am receiving treatment at [facility].   I will call my physician at [facility] if I receive controlled substance or controlled substance prescriptions from anywhere else. ___3)  Controlled substance refills will be made ONLY during regular office hours. ___4)  Refills will not be made if I run out early.  Refills will not be made during work-in or urgent care visits.  Refills will NOT be made for "emergencies", such as on a Friday afternoon or by on call service at night or weekends.  I understand that I am required to call at least 2 business days prior to expiration date for controlled substance and/or needing controlled substance refills.   ___5)  I will not use illicit (illegal) drugs.  ___6)  I agree to take urine or blood drug tests when requested for routine screening. ___7)  I agree to use only  ONE pharmacy for filling ALL my controlled substance prescriptions.             Name and Location of Pharmacy:                                                                                                                  ___8)  I understand that my doctor may review my use of controlled substances using the [facility] Controlled Substance Reporting System. ___9)  If I behave in an abusive way towards [facility] staff, my controlled substance prescriptions may be stopped, and I may be dismissed from this practice. ___10)  I understand that if I break any of the above terms of this contract, my pain prescription and/or treatment may be stopped immediately.  If I get controlled substances from someone else or use illegal drugs, I may be reported to all my doctors, medical facilities and appropriate authorities.  I have been fully informed by [facility] physicians and the staff regarding psychological dependence (  addiction) to controlled substances.  I understand that I should stop my medication ONLY under medical supervision or I may have withdrawal symptoms.  ***I have read this contract and it has been explained to me by [facility] physicians and/or their staff, and I fully understand the consequences of violating any of the terms of this contract.    Patient Signature _________________________________________ Date January 07, 2011   [facility] Staff Signature ____________________________________ Date January 07, 2011

## 2011-05-03 NOTE — Letter (Signed)
Summary: Controlled Substances Contract  [facility]  [address], [address]   [place], [place] [address]   Phone: [phone]  Fax: [phone]    [facility] Controlled Substances Contract         Patient Name: [name] Patient DOB: 02/11/1951        Patient MRN:  [id number]        Physician's Name: _________________________________________   Patients must complete this contract before doctors at the [facility] office will be willing to prescribe controlled substances. I understand that: ___1)  I am responsible for my controlled substance medications.  If my prescription is lost, misplaced or stolen, or if I take more than prescribed, my doctor will not write me a new prescription. ___2)  I will not request or accept controlled substances or controlled substance prescriptions from any other doctor or clinic while I am receiving controlled substance treatment at [facility].  The ONLY exception is if controlled substances are prescribed for the treatment of an acute condition that is NOT the diagnosis for which I am receiving treatment at [facility].   I will call my physician at [facility] if I receive controlled substance or controlled substance prescriptions from anywhere else. ___3)  Controlled substance refills will be made ONLY during regular office hours. ___4)  Refills will not be made if I run out early.  Refills will not be made during work-in or urgent care visits.  Refills will NOT be made for "emergencies", such as on a Friday afternoon or by on call service at night or weekends.  I understand that I am required to call at least 2 business days prior to expiration date for controlled substance and/or needing controlled substance refills.   ___5)  I will not use illicit (illegal) drugs.  ___6)  I agree to take urine or blood drug tests when requested for routine screening. ___7)  I agree to use only  ONE pharmacy for filling ALL my controlled substance prescriptions.             Name and Location of Pharmacy:                                                                                                                  ___8)  I understand that my doctor may review my use of controlled substances using the [place] Controlled Substance Reporting System. ___9)  If I behave in an abusive way towards [facility] staff, my controlled substance prescriptions may be stopped, and I may be dismissed from this practice. ___10)  I understand that if I break any of the above terms of this contract, my pain prescription and/or treatment may be stopped immediately.  If I get controlled substances from someone else or use illegal drugs, I may be reported to all my doctors, medical facilities and appropriate authorities.  I have been fully informed by  Primary Care physicians and the staff regarding psychological dependence (  addiction) to controlled substances.  I understand that I should stop my medication ONLY under medical supervision or I may have withdrawal symptoms.  ***I have read this contract and it has been explained to me by [facility] physicians and/or their staff, and I fully understand the consequences of violating any of the terms of this contract.    Patient Signature _________________________________________ Date January 07, 2011   [facility] [place] Staff Signature ____________________________________ Date January 07, 2011 

## 2011-05-03 NOTE — Progress Notes (Signed)
Summary: SINUS HEADACHE/WSE(rm 4)   Vital Signs:  Patient Profile:   60 Years Old Female CC:      SINUS INFECTION Height:     64.5 inches Weight:      129 pounds O2 Sat:      98 % O2 treatment:    Room Air Temp:     98.5 degrees F oral Pulse rate:   98 / minute Resp:     24 per minute BP sitting:   142 / 87  (left arm) Cuff size:   regular  Vitals Entered By: Linton Flemings RN (September 05, 2010 10:22 AM)                  Updated Prior Medication List: ALPRAZOLAM 1 MG  TABS (ALPRAZOLAM) 1 tab by mouth two times a day as needed anxiety GABAPENTIN 600 MG TABS (GABAPENTIN) 1 tab by mouth tid METFORMIN HCL 850 MG TABS (METFORMIN HCL) 1 tab by mouth bid LEVOTHYROXINE SODIUM 125 MCG TABS (LEVOTHYROXINE SODIUM) 1 tab by mouth daily VENTOLIN HFA 108 (90 BASE) MCG/ACT AERS (ALBUTEROL SULFATE) 2 puffs q 4 hrs prn OXYCODONE HCL 15 MG TABS (OXYCODONE HCL) 1 tab by mouth q 6 hrs as needed severe pain FLONASE 50 MCG/ACT SUSP (FLUTICASONE PROPIONATE) 2 sprays/ nostril daily CYMBALTA 60 MG CPEP (DULOXETINE HCL) 1 capusle by mouth qAM OMEPRAZOLE 40 MG CPDR (OMEPRAZOLE) 1 tab by mouth daily, 30 min before breakfast CLARITIN 10 MG TABS (LORATADINE) 1 tab by mouth daily (OTC) SPIRIVA HANDIHALER 18 MCG CAPS (TIOTROPIUM BROMIDE MONOHYDRATE) 1 capsule inhalation daily MUPIROCIN 2 % OINT (MUPIROCIN) apply to hand rash two times a day ATORVASTATIN CALCIUM 40 MG TABS (ATORVASTATIN CALCIUM) 1 tab by mouth at bedtime PREDNISONE 20 MG TABS (PREDNISONE) 3 tabs by mouth once daily x 5 days AZITHROMYCIN 250 MG TABS (AZITHROMYCIN) 2 tabs by mouth x 1 day then 1 tab by mouth daily x 4 days  Current Allergies (reviewed today): ! CODEINE ! * ULTRACET ! SYMBICORT (BUDESONIDE-FORMOTEROL FUMARATE) ! ADVAIR HFA (FLUTICASONE-SALMETEROL) ! [name] of Present Illness History from: patient Chief Complaint: SINUS INFECTION History of Present Illness: 60 Years Old Female complains of onset of cold  symptoms for a few days.  [name] has been using Claritin & Flonase which is helping a little bit. No sore throat ++ cough No pleuritic pain No wheezing + nasal congestion + post-nasal drainage ++ sinus pain/pressure No chest congestion No itchy/red eyes No earache No hemoptysis No SOB No chills/sweats No fever No nausea No vomiting No abdominal pain No diarrhea No skin rashes + fatigue No myalgias ++headache   REVIEW OF SYSTEMS Constitutional Symptoms      Denies fever, chills, night sweats, weight loss, weight gain, and fatigue.  Eyes       Complains of eye pain.      Denies change in vision, eye discharge, glasses, contact lenses, and eye surgery. Ear/Nose/Throat/Mouth       Complains of sinus problems.      Denies hearing loss/aids, change in hearing, ear pain, ear discharge, dizziness, frequent runny nose, frequent nose bleeds, sore throat, hoarseness, and tooth pain or bleeding.  Respiratory       Complains of dry cough, productive cough, and bronchitis.      Denies wheezing, shortness of breath, asthma, and emphysema/COPD.  Cardiovascular       Denies murmurs, chest pain, and tires easily with exhertion.    Gastrointestinal       Complains of diarrhea.  Denies stomach pain, nausea/vomiting, constipation, blood in bowel movements, and indigestion. Genitourniary       Denies painful urination, kidney stones, and loss of urinary control. Neurological       Denies paralysis, seizures, and fainting/blackouts. Musculoskeletal       Denies muscle pain, joint pain, joint stiffness, decreased range of motion, redness, swelling, muscle weakness, and gout.  Skin       Denies bruising, unusual mles/lumps or sores, and hair/skin or nail changes.  Psych       Denies mood changes, temper/anger issues, anxiety/stress, speech problems, depression, and sleep problems. Other Comments: pt states symptoms started Monday, she was recently treated for same symptoms x1 month ago; pt  states meds were ineffective.    Past History:  Past Medical History: Reviewed history from 06/10/2010 and no changes required. OA cervicalgia chronic pain syndrome (Dr Oneal Grout); was on Methadone Hx of narcotic Abuse anxiety DM , 2002 ([facility]G) hypothryroid high chol COPD osteoporosis fatty liver dz lumbar DDD  Past Surgical History: Reviewed history from 06/10/2010 and no changes required. GB breast lump, benign knee R and L hand 2007 R knee TKR 2010 lumbar laminectomy - Dr Netta Corrigan (05-2010)  Family History: Reviewed history from 07/31/2008 and no changes required. Mother and father AMI in 38's mother: high chol, died of NHL father died, DM GM Hodgkin's Lympoma sister:  primary biliary cirrhosis father, mother both had RA  Social History: Reviewed history from 06/05/2007 and no changes required. 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 Has 2 sons - 24 & 30. Smokes 1 ppd x 43 yrs. 5 ETOH/ wk. Denies drug use. does not work out Physical Exam General appearance: well developed, well nourished, no acute distress.  Smells of cigarette smoke. Head: maxillary tenderness Ears: normal, no lesions or deformities Nasal: clear discharge Oral/Pharynx: tongue normal, posterior pharynx without erythema or exudate Chest/Lungs: no rales, wheezes, or rhonchi bilateral, breath sounds equal without effort Heart: regular rate and  rhythm, no murmur MSE: oriented to time, place, and person  Plan New Medications/Changes: TESSALON 200 MG CAPS (BENZONATATE) 1 by mouth three times a day as needed for cough  #21 x 0, 09/05/2010, Hoyt Koch MD AMOXICILLIN 875 MG TABS (AMOXICILLIN) 1 by mouth two times a day for 12 days  #24 x 0, 09/05/2010, Hoyt Koch MD  New Orders: New Patient Level III [phone] Pulse Oximetry (single measurment) [94760] Services provided After hours-Weekends-Holidays [99051] Planning Comments:   Patient requested Tussionex cough meds.  I see from her  chart that she is on a Oxycodone taper per Dr. Cathey Endow or pain clinic and that she has been calling frequently to obtain more meds.  I will not be prescribing any narcotic cough meds and I will need to inform her PCP about this request since it is likely not allowed per the clinic's pain policy.  This makes me suspicious that her reason for visit is not sinus infection, but rather to obtain narcotic cough syrup.  Take the prescribed antibiotic as instructed.  May have chronic sinusitis if continued symptoms so may need to see ENT for evaluation.  She denies getting antibiotics last month but in the chart there is an Rx for Zpak for a sinus infection. Also gave Tessalon perles for her cough. She really needs to stop smoking and take her meds for COPD.  Use nasal saline solution (over the counter) at least 3 times a day. Use over the counter decongestants like Zyrtec-D every 12 hours for a few  days as needed to help with congestion. Can take motrin every 8 hours for pain or fever. Follow up with your primary doctor  if no improvement in 5-7 days, sooner if increasing pain, fever, or new symptoms.    The patient and/or caregiver has been counseled thoroughly with regard to medications prescribed including dosage, schedule, interactions, rationale for use, and possible side effects and they verbalize understanding.  Diagnoses and expected course of recovery discussed and will return if not improved as expected or if the condition worsens. Patient and/or caregiver verbalized understanding.  Prescriptions: TESSALON 200 MG CAPS (BENZONATATE) 1 by mouth three times a day as needed for cough  #21 x 0   Entered and Authorized by:   Hoyt Koch MD   Signed by:   Hoyt Koch MD on 09/05/2010   Method used:   Electronically to        [facility] [phone]* (retail)       [address]       [place], [place]  [address]       Ph: [phone]       Fax: [phone]   RxID:    [id number] AMOXICILLIN 875 MG TABS (AMOXICILLIN) 1 by mouth two times a day for 12 days  #24 x 0   Entered and Authorized by:   Hoyt Koch MD   Signed by:   Hoyt Koch MD on 09/05/2010   Method used:   Electronically to        [facility] [phone]* (retail)       [address]       [place], [place]  [address]       Ph: [phone]       Fax: [phone]   RxID:   [id number]   Orders Added: 1)  New Patient Level III [09323] 2)  Pulse Oximetry (single measurment) [94760] 3)  Services provided After hours-Weekends-Holidays [55732]

## 2011-05-03 NOTE — Letter (Signed)
Summary: Controlled Substances Contract  [facility]  [address], [address]   [place], [place] [address]   Phone: [phone]  Fax: [phone]    [facility] Controlled Substances Contract         Patient Name: [name] Patient DOB: 05/18/1951        Patient MRN:  [id number]        Physician's Name: _________________________________________   Patients must complete this contract before doctors at the [facility] office will be willing to prescribe controlled substances. I understand that: ___1)  I am responsible for my controlled substance medications.  If my prescription is lost, misplaced or stolen, or if I take more than prescribed, my doctor will not write me a new prescription. ___2)  I will not request or accept controlled substances or controlled substance prescriptions from any other doctor or clinic while I am receiving controlled substance treatment at [facility].  The ONLY exception is if controlled substances are prescribed for the treatment of an acute condition that is NOT the diagnosis for which I am receiving treatment at [facility].   I will call my physician at [facility] if I receive controlled substance or controlled substance prescriptions from anywhere else. ___3)  Controlled substance refills will be made ONLY during regular office hours. ___4)  Refills will not be made if I run out early.  Refills will not be made during work-in or urgent care visits.  Refills will NOT be made for "emergencies", such as on a Friday afternoon or by on call service at night or weekends.  I understand that I am required to call at least 2 business days prior to expiration date for controlled substance and/or needing controlled substance refills.   ___5)  I will not use illicit (illegal) drugs.  ___6)  I agree to take urine or blood drug tests when requested for routine screening. ___7)  I agree to use only  ONE pharmacy for filling ALL my controlled substance prescriptions.             Name and Location of Pharmacy:                                                                                                                  ___8)  I understand that my doctor may review my use of controlled substances using the [facility] Controlled Substance Reporting System. ___9)  If I behave in an abusive way towards [facility] staff, my controlled substance prescriptions may be stopped, and I may be dismissed from this practice. ___10)  I understand that if I break any of the above terms of this contract, my pain prescription and/or treatment may be stopped immediately.  If I get controlled substances from someone else or use illegal drugs, I may be reported to all my doctors, medical facilities and appropriate authorities.  I have been fully informed by [facility] physicians and the staff regarding psychological dependence (  addiction) to controlled substances.  I understand that I should stop my medication ONLY under medical supervision or I may have withdrawal symptoms.  ***I have read this contract and it has been explained to me by [facility] physicians and/or their staff, and I fully understand the consequences of violating any of the terms of this contract.    Patient Signature _________________________________________ Date January 07, 2011   [place] Staff Signature ____________________________________ Date January 07, 2011 

## 2011-05-03 NOTE — Telephone Encounter (Signed)
  Phone Note Call from Patient   Summary of Call: pt called and states that she accidently washed her keflex in the washing machine and needs a new rx. per dr Orson Aloe called in keflex for 5 days to [facility]. pt notified. Initial call taken by: Clemens Catholic LPN,  January 19, 2011 11:04 AM

## 2011-05-03 NOTE — Progress Notes (Signed)
Summary: BUG BITE...WSE   Vital Signs:  Patient Profile:   60 Years Old Female CC:      Rash, arms, neck, left breast, itches x 2 weeks Height:     64.5 inches Weight:      137 pounds O2 Sat:      95 % O2 treatment:    Room Air Temp:     98.9 degrees F oral Pulse rate:   93 / minute Pulse rhythm:   regular Resp:     18 per minute BP sitting:   151 / 79  (left arm) Cuff size:   regular  Vitals Entered By: Emilio Math (January 15, 2011 1:22 PM)                  Current Allergies (reviewed today): ! CODEINE ! * ULTRACET ! SYMBICORT (BUDESONIDE-FORMOTEROL FUMARATE) ! ADVAIR HFA (FLUTICASONE-SALMETEROL) ! [place] [place] of Present Illness History from: patient Chief Complaint: Rash, arms, neck, left breast, itches x 2 weeks History of Present Illness: She has had a rash on her arms, neck, and torso for 2 weeks.  She thinks it was caused by a spider bite. She has had a rash like this recently and it took 2 rounds of prednisone to help.  No F/C.  No new soaps, detergents, shampoos, clothes.  No tick bites.  No recent travel.  The rash is itchy  REVIEW OF SYSTEMS Constitutional Symptoms      Denies fever, chills, night sweats, weight loss, weight gain, and fatigue.  Eyes       Denies change in vision, eye pain, eye discharge, glasses, contact lenses, and eye surgery. Ear/Nose/Throat/Mouth       Denies hearing loss/aids, change in hearing, ear pain, ear discharge, dizziness, frequent runny nose, frequent nose bleeds, sinus problems, sore throat, hoarseness, and tooth pain or bleeding.  Respiratory       Denies dry cough, productive cough, wheezing, shortness of breath, asthma, bronchitis, and emphysema/COPD.  Cardiovascular       Denies murmurs, chest pain, and tires easily with exhertion.    Gastrointestinal       Denies stomach pain, nausea/vomiting, diarrhea, constipation, blood in bowel movements, and indigestion. Genitourniary       Denies painful urination, kidney  stones, and loss of urinary control. Neurological       Denies paralysis, seizures, and fainting/blackouts. Musculoskeletal       Denies muscle pain, joint pain, joint stiffness, decreased range of motion, redness, swelling, muscle weakness, and gout.  Skin       Denies bruising, unusual mles/lumps or sores, and hair/skin or nail changes.  Psych       Denies mood changes, temper/anger issues, anxiety/stress, speech problems, depression, and sleep problems.  Past History:  Past Medical History: Reviewed history from 06/10/2010 and no changes required. OA cervicalgia chronic pain syndrome (Dr Oneal Grout); was on Methadone Hx of narcotic Abuse anxiety DM , 2002 (IFG) hypothryroid high chol COPD osteoporosis fatty liver dz lumbar DDD  Past Surgical History: Reviewed history from 06/10/2010 and no changes required. GB breast lump, benign knee R and L hand 2007 R knee TKR 2010 lumbar laminectomy - Dr Netta Corrigan (05-2010)  Family History: Reviewed history from 07/31/2008 and no changes required. Mother and father AMI in 65's mother: high chol, died of NHL father died, DM GM Hodgkin's Lympoma sister:  primary biliary cirrhosis father, mother both had RA  Social History: Reviewed history from 06/05/2007 and no changes required. 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 Smokes 1 ppd x 43 yrs. 5 ETOH/ wk. Denies drug use. does not work out Physical Exam General appearance: well developed, well nourished, no acute distress MSE: oriented to time, place, and person Scattered irritated raised splotches over her arms and torso.  No facial involvement.  OP is patent and lungs CTAB. Assessment New Problems: DERMATITIS, ATOPIC (ICD-691.8)   Plan New Medications/Changes: PREDNISONE (PAK) 10 MG TABS (PREDNISONE) 12 day pack, use as directed  #1 x 0, 01/15/2011, Hoyt Koch MD KEFLEX 500 MG CAPS (CEPHALEXIN) 1 by mouth two times a day for 7 days  #14 x 0,  01/15/2011, Hoyt Koch MD  New Orders: Est. Patient Level IV [56213] Solumedrol up to 125mg  [J2930] Admin of Therapeutic Inj  intramuscular or subcutaneous [96372] Planning Comments:   This appears to be atopic dermatitis and she needs to look at her soaps, shampoos, etc to see if one is irritating her.  Consider her meds, foods, etc as well.  Will give Solumedrol shot, then 12 day pred taper.  She is asking for an antibiotic just to be sure, so I think it's ok to try Keflex for a week, though I don't see any evidence of bacterial infection.  If the problem recurs again, she needs to see dermatology or consider an allergy workup.  She understands and agrees.   The patient and/or caregiver has been counseled thoroughly with regard to medications prescribed including dosage, schedule, interactions, rationale for use, and possible side effects and they verbalize understanding.  Diagnoses and expected course of recovery discussed and will return if not improved as expected or if the condition worsens. Patient and/or caregiver verbalized understanding.  Prescriptions: PREDNISONE (PAK) 10 MG TABS (PREDNISONE) 12 day pack, use as directed  #1 x 0   Entered and Authorized by:   Hoyt Koch MD   Signed by:   Hoyt Koch MD on 01/15/2011   Method used:   Print then Give to Patient   RxID:   [id number] KEFLEX 500 MG CAPS (CEPHALEXIN) 1 by mouth two times a day for 7 days  #14 x 0   Entered and Authorized by:   Hoyt Koch MD   Signed by:   Hoyt Koch MD on 01/15/2011   Method used:   Print then Give to Patient   RxID:   [id number]   Medication Administration  Injection # 1:    Medication: Solumedrol up to 125mg     Diagnosis: DERMATITIS, ATOPIC (ICD-691.8)    Route: IM    Site: RUOQ gluteus    Exp Date: 09/28/2013    Lot #: [id number]    Mfr: P & Upjohn    Patient tolerated injection without complications    Given by: Emilio Math (January 15, 2011 1:39  PM)  Orders Added: 1)  Est. Patient Level IV [25956] 2)  Solumedrol up to 125mg  [J2930] 3)  Admin of Therapeutic Inj  intramuscular or subcutaneous [96372]     Medication Administration  Injection # 1:    Medication: Solumedrol up to 125mg     Diagnosis: DERMATITIS, ATOPIC (ICD-691.8)    Route: IM    Site: RUOQ gluteus    Exp Date: 09/28/2013    Lot #: [id number]    Mfr: P & Upjohn    Patient tolerated injection without complications    Given by: Emilio Math (January 15, 2011 1:39 PM)  Orders Added: 1)  Est. Patient Level IV [33295] 2)  Solumedrol up  to 125mg  [J2930] 3)  Admin of Therapeutic Inj  intramuscular or subcutaneous [40981]

## 2011-05-03 NOTE — Letter (Signed)
Summary: Controlled Substances Contract  [facility]  [address], [address]   [place], [place] [address]   Phone: [phone]  Fax: [phone]    [facility] Controlled Substances Contract         Patient Name: [name] Patient DOB: 04/08/1951        Patient MRN:  [id number]        Physician's Name: _________________________________________   Patients must complete this contract before doctors at the [facility] office will be willing to prescribe controlled substances. I understand that: ___1)  I am responsible for my controlled substance medications.  If my prescription is lost, misplaced or stolen, or if I take more than prescribed, my doctor will not write me a new prescription. ___2)  I will not request or accept controlled substances or controlled substance prescriptions from any other doctor or clinic while I am receiving controlled substance treatment at [facility].  The ONLY exception is if controlled substances are prescribed for the treatment of an acute condition that is NOT the diagnosis for which I am receiving treatment at [facility].   I will call my physician at [facility] if I receive controlled substance or controlled substance prescriptions from anywhere else. ___3)  Controlled substance refills will be made ONLY during regular office hours. ___4)  Refills will not be made if I run out early.  Refills will not be made during work-in or urgent care visits.  Refills will NOT be made for "emergencies", such as on a Friday afternoon or by on call service at night or weekends.  I understand that I am required to call at least 2 business days prior to expiration date for controlled substance and/or needing controlled substance refills.   ___5)  I will not use illicit (illegal) drugs.  ___6)  I agree to take urine or blood drug tests when requested for routine screening. ___7)  I agree to use only  ONE pharmacy for filling ALL my controlled substance prescriptions.             Name and Location of Pharmacy:                                                                                                                  ___8)  I understand that my doctor may review my use of controlled substances using the [facility] Controlled Substance Reporting System. ___9)  If I behave in an abusive way towards [facility] staff, my controlled substance prescriptions may be stopped, and I may be dismissed from this practice. ___10)  I understand that if I break any of the above terms of this contract, my pain prescription and/or treatment may be stopped immediately.  If I get controlled substances from someone else or use illegal drugs, I may be reported to all my doctors, medical facilities and appropriate authorities.  I have been fully informed by [facility] physicians and the staff regarding psychological dependence (  addiction) to controlled substances.  I understand that I should stop my medication ONLY under medical supervision or I may have withdrawal symptoms.  ***I have read this contract and it has been explained to me by [facility] physicians and/or their staff, and I fully understand the consequences of violating any of the terms of this contract.    Patient Signature _________________________________________ Date January 07, 2011   [place] Staff Signature ____________________________________ Date January 07, 2011 

## 2011-05-06 ENCOUNTER — Ambulatory Visit (INDEPENDENT_AMBULATORY_CARE_PROVIDER_SITE_OTHER): Payer: Managed Care, Other (non HMO) | Admitting: Family Medicine

## 2011-05-06 VITALS — BP 130/80 | HR 100 | Ht 64.0 in | Wt 134.0 lb

## 2011-05-06 DIAGNOSIS — E119 Type 2 diabetes mellitus without complications: Secondary | ICD-10-CM

## 2011-05-06 LAB — POCT GLYCOSYLATED HEMOGLOBIN (HGB A1C): Hemoglobin A1C: 6.1

## 2011-05-06 NOTE — Progress Notes (Signed)
  Subjective:    Patient ID: [name], female    DOB: [birth year], 60 y.o.   MRN: [id number]  HPI Nurse visit for A1C only. Doing well otherwise.     Review of Systems     Objective:   Physical Exam        Assessment & Plan:

## 2011-05-09 ENCOUNTER — Encounter: Payer: Self-pay | Admitting: Family Medicine

## 2011-05-09 NOTE — Progress Notes (Signed)
  Subjective:    Patient ID: [name], female    DOB: [birth year], 61 y.o.   MRN: [id number]  HPI    Review of Systems     Objective:   Physical Exam        Assessment & Plan:  Nurses visit only patient not seen.

## 2011-05-09 NOTE — Patient Instructions (Signed)
REturn for follow up visit next 4 months.

## 2011-05-10 ENCOUNTER — Other Ambulatory Visit: Payer: Self-pay | Admitting: *Deleted

## 2011-05-10 DIAGNOSIS — M1990 Unspecified osteoarthritis, unspecified site: Secondary | ICD-10-CM

## 2011-05-10 MED ORDER — LISINOPRIL 5 MG PO TABS: 5.0000 mg | ORAL_TABLET | Freq: Every day | ORAL | Status: DC

## 2011-05-10 MED ORDER — CELECOXIB 200 MG PO CAPS: 200.0000 mg | ORAL_CAPSULE | Freq: Every day | ORAL | Status: DC

## 2011-05-20 ENCOUNTER — Other Ambulatory Visit: Payer: Self-pay | Admitting: *Deleted

## 2011-05-20 DIAGNOSIS — F419 Anxiety disorder, unspecified: Secondary | ICD-10-CM

## 2011-05-20 DIAGNOSIS — G8929 Other chronic pain: Secondary | ICD-10-CM

## 2011-05-20 MED ORDER — OXYCODONE HCL 15 MG PO TABS: 22.5000 mg | ORAL_TABLET | Freq: Four times a day (QID) | ORAL | Status: DC | PRN

## 2011-05-20 MED ORDER — ALPRAZOLAM 1 MG PO TABS: 1.0000 mg | ORAL_TABLET | Freq: Two times a day (BID) | ORAL | Status: DC | PRN

## 2011-05-20 NOTE — Telephone Encounter (Signed)
Done  EW

## 2011-05-20 NOTE — Telephone Encounter (Signed)
Pt calls and request a refill on her Xanax and Oxycodone. States will be due around the new year but would like to pick up now since closings and travel so she will have it.

## 2011-05-26 ENCOUNTER — Other Ambulatory Visit: Payer: Self-pay | Admitting: *Deleted

## 2011-05-26 MED ORDER — ATORVASTATIN CALCIUM 40 MG PO TABS: 40.0000 mg | ORAL_TABLET | Freq: Every day | ORAL | Status: DC

## 2011-06-21 ENCOUNTER — Encounter: Payer: Self-pay | Admitting: Physician Assistant

## 2011-06-21 ENCOUNTER — Ambulatory Visit (INDEPENDENT_AMBULATORY_CARE_PROVIDER_SITE_OTHER): Payer: Managed Care, Other (non HMO) | Admitting: Physician Assistant

## 2011-06-21 VITALS — BP 158/93 | HR 124 | Temp 99.0°F | Wt 132.0 lb

## 2011-06-21 DIAGNOSIS — S335XXA Sprain of ligaments of lumbar spine, initial encounter: Secondary | ICD-10-CM

## 2011-06-21 DIAGNOSIS — S336XXA Sprain of sacroiliac joint, initial encounter: Secondary | ICD-10-CM

## 2011-06-21 DIAGNOSIS — J329 Chronic sinusitis, unspecified: Secondary | ICD-10-CM

## 2011-06-21 DIAGNOSIS — I10 Essential (primary) hypertension: Secondary | ICD-10-CM

## 2011-06-21 MED ORDER — AMOXICILLIN-POT CLAVULANATE 875-125 MG PO TABS: 1.0000 | ORAL_TABLET | Freq: Two times a day (BID) | ORAL | Status: AC

## 2011-06-21 MED ORDER — OXYCODONE HCL 40 MG PO TB12: 40.0000 mg | ORAL_TABLET | Freq: Two times a day (BID) | ORAL | Status: DC

## 2011-06-21 MED ORDER — METHYLPREDNISOLONE ACETATE 80 MG/ML IJ SUSP
80.0000 mg | Freq: Once | INTRAMUSCULAR | Status: AC
  Administered 2011-06-21: 80 mg via INTRAMUSCULAR

## 2011-06-21 NOTE — Progress Notes (Signed)
  Subjective:    Patient ID: [name], female    DOB: 19-Apr-1951, 61 y.o.   MRN: [id number]  HPI Patient presents to the clinic today with a ten-day history of sinus congestion. She states that there is pain behind her eyes, all throughout her head, and even into her teeth. She has had a continual headache going home 7 days. She's tried Tylenol with no relief. She denies sore throat, ear pain, shortness of breath, cough, nausea, vomiting, or fever.   Patient has a history of chronic pain. 2 days ago patient fell and reinjured her left lower back. She currently takes immediate release oxycodone 15 mg  1-1/2 tabs every 6 hours for pain. In the past pain specialist I put her on extended release OxyContin and they did not work. [name] been prescribed the immediate release back to the patient. Today she comes in not experiencing adequate pain control on the immediate release. She is aware that the extended-release would be better for her in the long run. Today she was tried the extended-release tabs for a couple of days without using the immediate release and see if she does any better with pain control. She has an appointment with [name] in 8 days and thinks that will be enough time to sell which pain medicine works better. And at that time [name] can prescribe pain medications.   she reports she does not want anymore Pap smears done.  All the colonoscopy is due insurance will not pay for it until next year.    Review of Systems     Objective:   Physical Exam  Constitutional: She is oriented to person, place, and time. She appears well-developed and well-nourished. No distress.  HENT:  Head: Normocephalic and atraumatic.  Right Ear: External ear normal.  Left Ear: External ear normal.  Nose: Nose normal.       Oropharynx erythematous. Bilateral maxillary tenderness with frontal lobe tenderness.  Eyes: Conjunctivae are normal. Right eye exhibits no discharge. Left eye exhibits no  discharge.  Neck: Normal range of motion. Neck supple.  Cardiovascular: Regular rhythm and normal heart sounds.        Tachycardia at 124.  Pulmonary/Chest: Effort normal. She has no wheezes.  Lymphadenopathy:    She has no cervical adenopathy.  Neurological: She is alert and oriented to person, place, and time.  Skin: Skin is warm and dry. She is not diaphoretic.  Psychiatric: She has a normal mood and affect. Her behavior is normal.          Assessment & Plan:   sinusitis-Augmentin was given twice a day. Depo-Medrol 80 mg shot was given in the office. Symptomatic care was reviewed. Followup if not improving by Friday.   Hypertension-patient did not take hypertension medication today. She states it has been well controlled at home until fall 2 days ago. Blood pressure will be rechecked at appointment for CPE with [name].   Chronic pain syndrome-OxyContin 40 mg twice a day was prescribed enough for 8 days. She was instructed to stop immediate release oxycodone. This should be adequate enough to tell if she would like [name] to switch his therapy to extended-release instead of immediate release for her pain control. Due to recent fall 2 days ago patient was given handout on low back sprain stretches.

## 2011-06-21 NOTE — Patient Instructions (Addendum)
Exercises for back pain given. Augmentin to start. Depo given in office. Discontinue immediate release oxycodone and start extended release 40mg  twice a day.   Low Back Sprain with Rehab  A sprain is an injury in which a ligament is torn. The ligaments of the lower back are vulnerable to sprains. However, they are strong and require great force to be injured. These ligaments are important for stabilizing the spinal column. Sprains are classified into three categories. Grade 1 sprains cause pain, but the tendon is not lengthened. Grade 2 sprains include a lengthened ligament, due to the ligament being stretched or partially ruptured. With grade 2 sprains there is still function, although the function may be decreased. Grade 3 sprains involve a complete tear of the tendon or muscle, and function is usually impaired. SYMPTOMS   Severe pain in the lower back.   Sometimes, a feeling of a "pop," "snap," or tear, at the time of injury.   Tenderness and sometimes swelling at the injury site.   Uncommonly, bruising (contusion) within 48 hours of injury.   Muscle spasms in the back.  CAUSES  Low back sprains occur when a force is placed on the ligaments that is greater than they can handle. Common causes of injury include:  Performing a stressful act while off-balance.   Repetitive stressful activities that involve movement of the lower back.   Direct hit (trauma) to the lower back.  RISK INCREASES WITH:  Contact sports (football, wrestling).   Collisions (major skiing accidents).   Sports that require throwing or lifting (baseball, weightlifting).   Sports involving twisting of the spine (gymnastics, diving, tennis, golf).   Poor strength and flexibility.   Inadequate protection.   Previous back injury or surgery (especially fusion).  PREVENTION  Wear properly fitted and padded protective equipment.   Warm up and stretch properly before activity.   Allow for adequate recovery  between workouts.   Maintain physical fitness:   Strength, flexibility, and endurance.   Cardiovascular fitness.   Maintain a healthy body weight.  PROGNOSIS  If treated properly, low back sprains usually heal with non-surgical treatment. The length of time for healing depends on the severity of the injury.  RELATED COMPLICATIONS   Recurring symptoms, resulting in a chronic problem.   Chronic inflammation and pain in the low back.   Delayed healing or resolution of symptoms, especially if activity is resumed too soon.   Prolonged impairment.   Unstable or arthritic joints of the low back.  TREATMENT  Treatment first involves the use of ice and medicine, to reduce pain and inflammation. The use of strengthening and stretching exercises may help reduce pain with activity. These exercises may be performed at home or with a therapist. Severe injuries may require referral to a therapist for further evaluation and treatment, such as ultrasound. Your caregiver may advise that you wear a back brace or corset, to help reduce pain and discomfort. Often, prolonged bed rest results in greater harm then benefit. Corticosteroid injections may be recommended. However, these should be reserved for the most serious cases. It is important to avoid using your back when lifting objects. At night, sleep on your back on a firm mattress, with a pillow placed under your knees. If non-surgical treatment is unsuccessful, surgery may be needed.  MEDICATION   If pain medicine is needed, nonsteroidal anti-inflammatory medicines (aspirin and ibuprofen), or other minor pain relievers (acetaminophen), are often advised.   Do not take pain medicine for 7 days before  surgery.   Prescription pain relievers may be given, if your caregiver thinks they are needed. Use only as directed and only as much as you need.   Ointments applied to the skin may be helpful.   Corticosteroid injections may be given by your caregiver.  These injections should be reserved for the most serious cases, because they may only be given a certain number of times.  HEAT AND COLD  Cold treatment (icing) should be applied for 10 to 15 minutes every 2 to 3 hours for inflammation and pain, and immediately after activity that aggravates your symptoms. Use ice packs or an ice massage.   Heat treatment may be used before performing stretching and strengthening activities prescribed by your caregiver, physical therapist, or athletic trainer. Use a heat pack or a warm water soak.  SEEK MEDICAL CARE IF:   Symptoms get worse or do not improve in 2 to 4 weeks, despite treatment.   You develop numbness or weakness in either leg.   You lose bowel or bladder function.   Any of the following occur after surgery: fever, increased pain, swelling, redness, drainage of fluids, or bleeding in the affected area.   New, unexplained symptoms develop. (Drugs used in treatment may produce side effects.)  EXERCISES  RANGE OF MOTION (ROM) AND STRETCHING EXERCISES - Low Back Sprain Most people with lower back pain will find that their symptoms get worse with excessive bending forward (flexion) or arching at the lower back (extension). The exercises that will help resolve your symptoms will focus on the opposite motion.  Your physician, physical therapist or athletic trainer will help you determine which exercises will be most helpful to resolve your lower back pain. Do not complete any exercises without first consulting with your caregiver. Discontinue any exercises which make your symptoms worse, until you speak to your caregiver. If you have pain, numbness or tingling which travels down into your buttocks, leg or foot, the goal of the therapy is for these symptoms to move closer to your back and eventually resolve. Sometimes, these leg symptoms will get better, but your lower back pain may worsen. This is often an indication of progress in your rehabilitation.  Be very alert to any changes in your symptoms and the activities in which you participated in the 24 hours prior to the change. Sharing this information with your caregiver will allow him or her to most efficiently treat your condition. These exercises may help you when beginning to rehabilitate your injury. Your symptoms may resolve with or without further involvement from your physician, physical therapist or athletic trainer. While completing these exercises, remember:   Restoring tissue flexibility helps normal motion to return to the joints. This allows healthier, less painful movement and activity.   An effective stretch should be held for at least 30 seconds.   A stretch should never be painful. You should only feel a gentle lengthening or release in the stretched tissue.  FLEXION RANGE OF MOTION AND STRETCHING EXERCISES: STRETCH - Flexion, Single Knee to Chest   Lie on a firm bed or floor with both legs extended in front of you.   Keeping one leg in contact with the floor, bring your opposite knee to your chest. Hold your leg in place by either grabbing behind your thigh or at your knee.   Pull until you feel a gentle stretch in your low back. Hold __________ seconds.   Slowly release your grasp and repeat the exercise with the opposite side.  Repeat __________ times. Complete this exercise __________ times per day.  STRETCH - Flexion, Double Knee to Chest  Lie on a firm bed or floor with both legs extended in front of you.   Keeping one leg in contact with the floor, bring your opposite knee to your chest.   Tense your stomach muscles to support your back and then lift your other knee to your chest. Hold your legs in place by either grabbing behind your thighs or at your knees.   Pull both knees toward your chest until you feel a gentle stretch in your low back. Hold __________ seconds.   Tense your stomach muscles and slowly return one leg at a time to the floor.  Repeat  __________ times. Complete this exercise __________ times per day.  STRETCH - Low Trunk Rotation  Lie on a firm bed or floor. Keeping your legs in front of you, bend your knees so they are both pointed toward the ceiling and your feet are flat on the floor.   Extend your arms out to the side. This will stabilize your upper body by keeping your shoulders in contact with the floor.   Gently and slowly drop both knees together to one side until you feel a gentle stretch in your low back. Hold for __________ seconds.   Tense your stomach muscles to support your lower back as you bring your knees back to the starting position. Repeat the exercise to the other side.  Repeat __________ times. Complete this exercise __________ times per day  EXTENSION RANGE OF MOTION AND FLEXIBILITY EXERCISES: STRETCH - Extension, Prone on Elbows   Lie on your stomach on the floor, a bed will be too soft. Place your palms about shoulder width apart and at the height of your head.   Place your elbows under your shoulders. If this is too painful, stack pillows under your chest.   Allow your body to relax so that your hips drop lower and make contact more completely with the floor.   Hold this position for __________ seconds.   Slowly return to lying flat on the floor.  Repeat __________ times. Complete this exercise __________ times per day.  RANGE OF MOTION - Extension, Prone Press Ups  Lie on your stomach on the floor, a bed will be too soft. Place your palms about shoulder width apart and at the height of your head.   Keeping your back as relaxed as possible, slowly straighten your elbows while keeping your hips on the floor. You may adjust the placement of your hands to maximize your comfort. As you gain motion, your hands will come more underneath your shoulders.   Hold this position __________ seconds.   Slowly return to lying flat on the floor.  Repeat __________ times. Complete this exercise __________  times per day.  RANGE OF MOTION- Quadruped, Neutral Spine   Assume a hands and knees position on a firm surface. Keep your hands under your shoulders and your knees under your hips. You may place padding under your knees for comfort.   Drop your head and point your tailbone toward the ground below you. This will round out your lower back like an angry cat. Hold this position for __________ seconds.   Slowly lift your head and release your tail bone so that your back sags into a large arch, like an old horse.   Hold this position for __________ seconds.   Repeat this until you feel limber in your low back.  Now, find your "sweet spot." This will be the most comfortable position somewhere between the two previous positions. This is your neutral spine. Once you have found this position, tense your stomach muscles to support your low back.   Hold this position for __________ seconds.  Repeat __________ times. Complete this exercise __________ times per day.  STRENGTHENING EXERCISES - Low Back Sprain These exercises may help you when beginning to rehabilitate your injury. These exercises should be done near your "sweet spot." This is the neutral, low-back arch, somewhere between fully rounded and fully arched, that is your least painful position. When performed in this safe range of motion, these exercises can be used for people who have either a flexion or extension based injury. These exercises may resolve your symptoms with or without further involvement from your physician, physical therapist or athletic trainer. While completing these exercises, remember:   Muscles can gain both the endurance and the strength needed for everyday activities through controlled exercises.   Complete these exercises as instructed by your physician, physical therapist or athletic trainer. Increase the resistance and repetitions only as guided.   You may experience muscle soreness or fatigue, but the pain or  discomfort you are trying to eliminate should never worsen during these exercises. If this pain does worsen, stop and make certain you are following the directions exactly. If the pain is still present after adjustments, discontinue the exercise until you can discuss the trouble with your caregiver.  STRENGTHENING - Deep Abdominals, Pelvic Tilt   Lie on a firm bed or floor. Keeping your legs in front of you, bend your knees so they are both pointed toward the ceiling and your feet are flat on the floor.   Tense your lower abdominal muscles to press your low back into the floor. This motion will rotate your pelvis so that your tail bone is scooping upwards rather than pointing at your feet or into the floor.  With a gentle tension and even breathing, hold this position for __________ seconds. Repeat __________ times. Complete this exercise __________ times per day.  STRENGTHENING - Abdominals, Crunches   Lie on a firm bed or floor. Keeping your legs in front of you, bend your knees so they are both pointed toward the ceiling and your feet are flat on the floor. Cross your arms over your chest.   Slightly tip your chin down without bending your neck.   Tense your abdominals and slowly lift your trunk high enough to just clear your shoulder blades. Lifting higher can put excessive stress on the lower back and does not further strengthen your abdominal muscles.   Control your return to the starting position.  Repeat __________ times. Complete this exercise __________ times per day.  STRENGTHENING - Quadruped, Opposite UE/LE Lift   Assume a hands and knees position on a firm surface. Keep your hands under your shoulders and your knees under your hips. You may place padding under your knees for comfort.   Find your neutral spine and gently tense your abdominal muscles so that you can maintain this position. Your shoulders and hips should form a rectangle that is parallel with the floor and is not  twisted.   Keeping your trunk steady, lift your right hand no higher than your shoulder and then your left leg no higher than your hip. Make sure you are not holding your breath. Hold this position for __________ seconds.   Continuing to keep your abdominal muscles tense and your back  steady, slowly return to your starting position. Repeat with the opposite arm and leg.  Repeat __________ times. Complete this exercise __________ times per day.  STRENGTHENING - Abdominals and Quadriceps, Straight Leg Raise   Lie on a firm bed or floor with both legs extended in front of you.   Keeping one leg in contact with the floor, bend the other knee so that your foot can rest flat on the floor.   Find your neutral spine, and tense your abdominal muscles to maintain your spinal position throughout the exercise.   Slowly lift your straight leg off the floor about 6 inches for a count of 15, making sure to not hold your breath.   Still keeping your neutral spine, slowly lower your leg all the way to the floor.  Repeat this exercise with each leg __________ times. Complete this exercise __________ times per day. POSTURE AND BODY MECHANICS CONSIDERATIONS - Low Back Sprain Keeping correct posture when sitting, standing or completing your activities will reduce the stress put on different body tissues, allowing injured tissues a chance to heal and limiting painful experiences. The following are general guidelines for improved posture. Your physician or physical therapist will provide you with any instructions specific to your needs. While reading these guidelines, remember:  The exercises prescribed by your provider will help you have the flexibility and strength to maintain correct postures.   The correct posture provides the best environment for your joints to work. All of your joints have less wear and tear when properly supported by a spine with good posture. This means you will experience a healthier, less  painful body.   Correct posture must be practiced with all of your activities, especially prolonged sitting and standing. Correct posture is as important when doing repetitive low-stress activities (typing) as it is when doing a single heavy-load activity (lifting).  RESTING POSITIONS Consider which positions are most painful for you when choosing a resting position. If you have pain with flexion-based activities (sitting, bending, stooping, squatting), choose a position that allows you to rest in a less flexed posture. You would want to avoid curling into a fetal position on your side. If your pain worsens with extension-based activities (prolonged standing, working overhead), avoid resting in an extended position such as sleeping on your stomach. Most people will find more comfort when they rest with their spine in a more neutral position, neither too rounded nor too arched. Lying on a non-sagging bed on your side with a pillow between your knees, or on your back with a pillow under your knees will often provide some relief. Keep in mind, being in any one position for a prolonged period of time, no matter how correct your posture, can still lead to stiffness. PROPER SITTING POSTURE In order to minimize stress and discomfort on your spine, you must sit with correct posture. Sitting with good posture should be effortless for a healthy body. Returning to good posture is a gradual process. Many people can work toward this most comfortably by using various supports until they have the flexibility and strength to maintain this posture on their own. When sitting with proper posture, your ears will fall over your shoulders and your shoulders will fall over your hips. You should use the back of the chair to support your upper back. Your lower back will be in a neutral position, just slightly arched. You may place a small pillow or folded towel at the base of your lower back for  support.  When working at a desk,  create an environment that supports good, upright posture. Without extra support, muscles tire, which leads to excessive strain on joints and other tissues. Keep these recommendations in mind: CHAIR:  A chair should be able to slide under your desk when your back makes contact with the back of the chair. This allows you to work closely.   The chair's height should allow your eyes to be level with the upper part of your monitor and your hands to be slightly lower than your elbows.  BODY POSITION  Your feet should make contact with the floor. If this is not possible, use a foot rest.   Keep your ears over your shoulders. This will reduce stress on your neck and low back.  INCORRECT SITTING POSTURES  If you are feeling tired and unable to assume a healthy sitting posture, do not slouch or slump. This puts excessive strain on your back tissues, causing more damage and pain. Healthier options include:  Using more support, like a lumbar pillow.   Switching tasks to something that requires you to be upright or walking.   Talking a brief walk.   Lying down to rest in a neutral-spine position.  PROLONGED STANDING WHILE SLIGHTLY LEANING FORWARD  When completing a task that requires you to lean forward while standing in one place for a long time, place either foot up on a stationary 2-4 inch high object to help maintain the best posture. When both feet are on the ground, the lower back tends to lose its slight inward curve. If this curve flattens (or becomes too large), then the back and your other joints will experience too much stress, tire more quickly, and can cause pain. CORRECT STANDING POSTURES Proper standing posture should be assumed with all daily activities, even if they only take a few moments, like when brushing your teeth. As in sitting, your ears should fall over your shoulders and your shoulders should fall over your hips. You should keep a slight tension in your abdominal muscles to  brace your spine. Your tailbone should point down to the ground, not behind your body, resulting in an over-extended swayback posture.  INCORRECT STANDING POSTURES  Common incorrect standing postures include a forward head, locked knees and/or an excessive swayback. WALKING Walk with an upright posture. Your ears, shoulders and hips should all line-up. PROLONGED ACTIVITY IN A FLEXED POSITION When completing a task that requires you to bend forward at your waist or lean over a low surface, try to find a way to stabilize 3 out of 4 of your limbs. You can place a hand or elbow on your thigh or rest a knee on the surface you are reaching across. This will provide you more stability, so that your muscles do not tire as quickly. By keeping your knees relaxed, or slightly bent, you will also reduce stress across your lower back. CORRECT LIFTING TECHNIQUES DO :  Assume a wide stance. This will provide you more stability and the opportunity to get as close as possible to the object which you are lifting.   Tense your abdominals to brace your spine. Bend at the knees and hips. Keeping your back locked in a neutral-spine position, lift using your leg muscles. Lift with your legs, keeping your back straight.   Test the weight of unknown objects before attempting to lift them.   Try to keep your elbows locked down at your sides in order get the best strength from your shoulders  when carrying an object.   Always ask for help when lifting heavy or awkward objects.  INCORRECT LIFTING TECHNIQUES DO NOT:   Lock your knees when lifting, even if it is a small object.   Bend and twist. Pivot at your feet or move your feet when needing to change directions.   Assume that you can safely pick up even a paperclip without proper posture.  Document Released: 05/17/2005 Document Revised: 01/27/2011 Document Reviewed: 08/29/2008 [facility] Patient Information 2012 [place], [place].

## 2011-06-21 NOTE — Progress Notes (Signed)
Addended by: Ellsworth Lennox on: 06/21/2011 01:10 PM   Modules accepted: Orders

## 2011-06-23 ENCOUNTER — Telehealth: Payer: Self-pay | Admitting: Physician Assistant

## 2011-06-23 NOTE — Telephone Encounter (Signed)
Called patient to see how see was doing on the controlled release oxycontin. She was instructed to call back so we could call in enough until she has an appointment with Dr.Wade. I did not want her to run out and abruptly stop medication.

## 2011-06-24 ENCOUNTER — Telehealth: Payer: Self-pay | Admitting: *Deleted

## 2011-06-24 NOTE — Telephone Encounter (Signed)
Pt calls and states feels like the oxy is working and would like to stay with this and would like a script.

## 2011-06-25 ENCOUNTER — Telehealth: Payer: Self-pay | Admitting: *Deleted

## 2011-06-25 MED ORDER — OXYCODONE HCL 40 MG PO TB12: 40.0000 mg | ORAL_TABLET | Freq: Two times a day (BID) | ORAL | Status: DC

## 2011-06-25 NOTE — Telephone Encounter (Signed)
Please call patient and let her know we are faxing 1 month. She needs to follow up Dr. Thurmond Butts if he is going to be managing pain control.

## 2011-06-29 ENCOUNTER — Encounter: Payer: Managed Care, Other (non HMO) | Admitting: Family Medicine

## 2011-06-30 ENCOUNTER — Other Ambulatory Visit: Payer: Self-pay | Admitting: Family Medicine

## 2011-07-20 ENCOUNTER — Encounter: Payer: Self-pay | Admitting: Family Medicine

## 2011-07-20 ENCOUNTER — Ambulatory Visit (INDEPENDENT_AMBULATORY_CARE_PROVIDER_SITE_OTHER): Payer: Managed Care, Other (non HMO) | Admitting: Family Medicine

## 2011-07-20 VITALS — BP 156/86 | HR 104 | Ht 64.0 in | Wt 130.0 lb

## 2011-07-20 DIAGNOSIS — F419 Anxiety disorder, unspecified: Secondary | ICD-10-CM

## 2011-07-20 DIAGNOSIS — Z0000 Encounter for general adult medical examination without abnormal findings: Secondary | ICD-10-CM

## 2011-07-20 DIAGNOSIS — Z1239 Encounter for other screening for malignant neoplasm of breast: Secondary | ICD-10-CM

## 2011-07-20 DIAGNOSIS — E119 Type 2 diabetes mellitus without complications: Secondary | ICD-10-CM

## 2011-07-20 DIAGNOSIS — G8929 Other chronic pain: Secondary | ICD-10-CM

## 2011-07-20 DIAGNOSIS — K219 Gastro-esophageal reflux disease without esophagitis: Secondary | ICD-10-CM

## 2011-07-20 DIAGNOSIS — M8580 Other specified disorders of bone density and structure, unspecified site: Secondary | ICD-10-CM

## 2011-07-20 DIAGNOSIS — J449 Chronic obstructive pulmonary disease, unspecified: Secondary | ICD-10-CM

## 2011-07-20 MED ORDER — ALPRAZOLAM 1 MG PO TABS: 1.0000 mg | ORAL_TABLET | Freq: Two times a day (BID) | ORAL | Status: DC | PRN

## 2011-07-20 MED ORDER — METFORMIN HCL 850 MG PO TABS: 850.0000 mg | ORAL_TABLET | Freq: Two times a day (BID) | ORAL | Status: DC

## 2011-07-20 MED ORDER — DICLOFENAC SODIUM 1 % TD GEL: 1.0000 "application " | Freq: Four times a day (QID) | TRANSDERMAL | Status: DC

## 2011-07-20 MED ORDER — LORATADINE 10 MG PO TABS: 10.0000 mg | ORAL_TABLET | Freq: Every day | ORAL | Status: DC

## 2011-07-20 MED ORDER — GABAPENTIN 600 MG PO TABS: 600.0000 mg | ORAL_TABLET | Freq: Two times a day (BID) | ORAL | Status: DC

## 2011-07-20 MED ORDER — ATORVASTATIN CALCIUM 40 MG PO TABS: 40.0000 mg | ORAL_TABLET | Freq: Every day | ORAL | Status: DC

## 2011-07-20 MED ORDER — LEVOTHYROXINE SODIUM 112 MCG PO TABS: 112.0000 ug | ORAL_TABLET | Freq: Every day | ORAL | Status: DC

## 2011-07-20 MED ORDER — TIOTROPIUM BROMIDE MONOHYDRATE 18 MCG IN CAPS: 18.0000 ug | ORAL_CAPSULE | Freq: Every day | RESPIRATORY_TRACT | Status: DC

## 2011-07-20 MED ORDER — CITALOPRAM HYDROBROMIDE 20 MG PO TABS: 20.0000 mg | ORAL_TABLET | Freq: Every day | ORAL | Status: DC

## 2011-07-20 MED ORDER — OXYCODONE HCL 15 MG PO TABS: 22.5000 mg | ORAL_TABLET | Freq: Four times a day (QID) | ORAL | Status: DC | PRN

## 2011-07-20 NOTE — Progress Notes (Signed)
Subjective:    Patient ID: [name], female    DOB: Aug 09, 1950, 61 y.o.   MRN: [id number]  HPI Patient to have a physical examination her insurance requirements initially she declined to remove her clothes but explained to her I will be unable to sign the forms unless a standard physical was done. She informs me she does do need to have a Pap smear and even though according to records she needs an updated colonoscopy she states she doesn't need one for 2 more years and declines rectal exam as well. Initially plan to get an EKG she states that last year she had back surgery at [facility] they did an EKG then. She is informing that the long-acting oxycodone OxyContin was too expensive and she would go back to the oxycodone at higher dosage. Discussed with her some health maintenance needs her she declines and she reports not able to tolerate either the Lyrica or Cymbalta.   smoking cessation discussed with patient very little interest shown Hypertension second visit elevated patient minimize elevation and rationalize it to not taking her medication today History of osteoporosis now currently osteopenic secondary to yearly IM treatment  Review of Systems  Constitutional: Positive for activity change and fatigue.  Respiratory: Positive for shortness of breath.   Musculoskeletal: Positive for back pain and arthralgias.  Neurological: Positive for weakness.  Psychiatric/Behavioral: Positive for dysphoric mood.   As above  Allergies  Allergen Reactions  . Advair Hfa   . Codeine   . Fentanyl     REACTION: severe aggitation, mood changes  . Symbicort     REACTION: rash  . Tramadol-Acetaminophen    History   Social History  . Marital Status: Married    Spouse Name: N/A    Number of Children: N/A  . Years of Education: N/A   Occupational History  . Not on file.   Social History Main Topics  . Smoking status: Current Everyday Smoker -- 1.0 packs/day for 43 years   Types: Cigarettes  . Smokeless tobacco: Never Used  . Alcohol Use: 2.5 oz/week    5 drink(s) per week  . Drug Use: No  . Sexually Active: Not on file   Other Topics Concern  . Not on file   Social History Narrative  . No narrative on file   Family History  Problem Relation Age of Onset  . Heart disease Mother 55    MI  . Hyperlipidemia Mother   . Heart disease Father 67    MI  . Diabetes Father   . Cancer Maternal Grandmother     hodgkins lympoma  . Cirrhosis Sister     primary biliary  . Rheum arthritis Mother   . Rheum arthritis Father    Past Medical History  Diagnosis Date  . Osteoarthritis   . Cervicalgia   . Chronic pain   . Narcotic abuse     history  . Diabetes mellitus   . Thyroid disease     hypothyroid  . Hyperlipidemia   . COPD (chronic obstructive pulmonary disease)   . Osteoporosis   . Fatty liver   . DDD (degenerative disc disease), lumbar    Past Surgical History  Procedure Date  . Cholecystectomy   . Breast surgery     braest lump/ benign  . Total knee arthroplasty 2010    right  . Lumbar laminectomy 05/2010    Dr. Netta Corrigan      BP 156/86  Pulse 104  Ht  5\' 4"  (1.626 m)  Wt 130 lb (58.968 kg)  BMI 22.31 kg/m2  SpO2 99% Objective:   Physical Exam  Constitutional: She is oriented to person, place, and time. She appears well-developed and well-nourished.  HENT:  Head: Normocephalic and atraumatic.  Right Ear: External ear normal.  Left Ear: External ear normal.  Nose: Nose normal.  Mouth/Throat: Oropharynx is clear and moist.  Eyes: Lids are normal. Pupils are equal, round, and reactive to light. Right conjunctiva is not injected. Left conjunctiva is injected.  Fundoscopic exam:      The right eye shows no arteriolar narrowing and no AV nicking.       The left eye shows no arteriolar narrowing and no AV nicking.  Cardiovascular: Normal rate, regular rhythm and normal heart sounds.  Exam reveals no friction rub.   No murmur  heard. Pulmonary/Chest: Effort normal and breath sounds normal.  Abdominal: Soft. Bowel sounds are normal. She exhibits no distension. There is no tenderness.       Upper R abdominal scar noted  Genitourinary:       Patient declines pelvic and rectal exam Patient unable to give urine sample now but promised to return for U/A today  Musculoskeletal: Normal range of motion. She exhibits no edema and no tenderness.       No signs of ulceration of her feet. Pulses intact good vibratory sensation still present in both feet.  Neurological: She is alert and oriented to person, place, and time. She has normal reflexes.  Skin: Skin is warm and dry.  Psychiatric: Her behavior is normal. Thought content normal. Her mood appears anxious. Cognition and memory are normal.          Assesment  And Plan     ssessment #1 &  health maintenancePlan:    #1Health Maintenance #2 Tobacco counciling #3 osteopenia #4 hypertension #5 diabetes she states that she is seeing her ophthalmologist in the last year but could not give me a date #6 anxiety disorder #7 chronic pain we'll switch her from OxyContin back  to oxycodone but will respectively declined to increase the previous dosage

## 2011-07-20 NOTE — Patient Instructions (Signed)
Smoking Cessation, Tips for Success YOU CAN QUIT SMOKING If you are ready to quit smoking, congratulations! You have chosen to help yourself be healthier. Cigarettes bring nicotine, tar, carbon monoxide, and other irritants into your body. Your lungs, heart, and blood vessels will be able to work better without these poisons. There are many different ways to quit smoking. Nicotine gum, nicotine patches, a nicotine inhaler, or nicotine nasal spray can help with physical craving. Hypnosis, support groups, and medicines help break the habit of smoking. Here are some tips to help you quit for good.  Throw away all cigarettes.   Clean and remove all ashtrays from your home, work, and car.   On a card, write down your reasons for quitting. Carry the card with you and read it when you get the urge to smoke.   Cleanse your body of nicotine. Drink enough water and fluids to keep your urine clear or pale yellow. Do this after quitting to flush the nicotine from your body.   Learn to predict your moods. Do not let a bad situation be your excuse to have a cigarette. Some situations in your life might tempt you into wanting a cigarette.   Never have "just one" cigarette. It leads to wanting another and another. Remind yourself of your decision to quit.   Change habits associated with smoking. If you smoked while driving or when feeling stressed, try other activities to replace smoking. Stand up when drinking your coffee. Brush your teeth after eating. Sit in a different chair when you read the paper. Avoid alcohol while trying to quit, and try to drink fewer caffeinated beverages. Alcohol and caffeine may urge you to smoke.   Avoid foods and drinks that can trigger a desire to smoke, such as sugary or spicy foods and alcohol.   Ask people who smoke not to smoke around you.   Have something planned to do right after eating or having a cup of coffee. Take a walk or exercise to perk you up. This will help to  keep you from overeating.   Try a relaxation exercise to calm you down and decrease your stress. Remember, you may be tense and nervous for the first 2 weeks after you quit, but this will pass.   Find new activities to keep your hands busy. Play with a pen, coin, or rubber band. Doodle or draw things on paper.   Brush your teeth right after eating. This will help cut down on the craving for the taste of tobacco after meals. You can try mouthwash, too.   Use oral substitutes, such as lemon drops, carrots, a cinnamon stick, or chewing gum, in place of cigarettes. Keep them handy so they are available when you have the urge to smoke.   When you have the urge to smoke, try deep breathing.   Designate your home as a nonsmoking area.   If you are a heavy smoker, ask your caregiver about a prescription for nicotine chewing gum. It can ease your withdrawal from nicotine.   Reward yourself. Set aside the cigarette money you save and buy yourself something nice.   Look for support from others. Join a support group or smoking cessation program. Ask someone at home or at work to help you with your plan to quit smoking.   Always ask yourself, "Do I need this cigarette or is this just a reflex?" Tell yourself, "Today, I choose not to smoke," or "I do not want to smoke." You are   reminding yourself of your decision to quit, even if you do smoke a cigarette.  HOW WILL I FEEL WHEN I QUIT SMOKING?  The benefits of not smoking start within days of quitting.   You may have symptoms of withdrawal because your body is used to nicotine (the addictive substance in cigarettes). You may crave cigarettes, be irritable, feel very hungry, cough often, get headaches, or have difficulty concentrating.   The withdrawal symptoms are only temporary. They are strongest when you first quit but will go away within 10 to 14 days.   When withdrawal symptoms occur, stay in control. Think about your reasons for quitting. Remind  yourself that these are signs that your body is healing and getting used to being without cigarettes.   Remember that withdrawal symptoms are easier to treat than the major diseases that smoking can cause.   Even after the withdrawal is over, expect periodic urges to smoke. However, these cravings are generally short-lived and will go away whether you smoke or not. Do not smoke!   If you relapse and smoke again, do not lose hope. Most smokers quit 3 times before they are successful.   If you relapse, do not give up! Plan ahead and think about what you will do the next time you get the urge to smoke.  LIFE AS A NONSMOKER: MAKE IT FOR A MONTH, MAKE IT FOR LIFE Day 1: Hang this page where you will see it every day. Day 2: Get rid of all ashtrays, matches, and lighters. Day 3: Drink water. Breathe deeply between sips. Day 4: Avoid places with smoke-filled air, such as bars, clubs, or the smoking section of restaurants. Day 5: Keep track of how much money you save by not smoking. Day 6: Avoid boredom. Keep a good book with you or go to the movies. Day 7: Reward yourself! One week without smoking! Day 8: Make a dental appointment to get your teeth cleaned. Day 9: Decide how you will turn down a cigarette before it is offered to you. Day 10: Review your reasons for quitting. Day 11: Distract yourself. Stay active to keep your mind off smoking and to relieve tension. Take a walk, exercise, read a book, do a crossword puzzle, or try a new hobby. Day 12: Exercise. Get off the bus before your stop or use stairs instead of escalators. Day 13: Call on friends for support and encouragement. Day 14: Reward yourself! Two weeks without smoking! Day 15: Practice deep breathing exercises. Day 16: Bet a friend that you can stay a nonsmoker. Day 17: Ask to sit in nonsmoking sections of restaurants. Day 18: Hang up "No Smoking" signs. Day 19: Think of yourself as a nonsmoker. Day 20: Each morning, tell  yourself you will not smoke. Day 21: Reward yourself! Three weeks without smoking! Day 22: Think of smoking in negative ways. Remember how it stains your teeth, gives you bad breath, and leaves you short of breath. Day 23: Eat a nutritious breakfast. Day 24:Do not relive your days as a smoker. Day 25: Hold a pencil in your hand when talking on the telephone. Day 26: Tell all your friends you do not smoke. Day 27: Think about how much better food tastes. Day 28: Remember, one cigarette is one too many. Day 29: Take up a hobby that will keep your hands busy. Day 30: Congratulations! One month without smoking! Give yourself a big reward. Your caregiver can direct you to community resources or hospitals for support, which   may include:  Group support.   Education.   Hypnosis.   Subliminal therapy.  Document Released: 02/13/2004 Document Revised: 01/27/2011 Document Reviewed: 03/03/2009 Exit[employer] Patient Information 2012 [employer].Smoking Cessation This document explains the best ways for you to quit smoking and new treatments to help. It lists new medicines that can double or triple your chances of quitting and quitting for good. It also considers ways to avoid relapses and concerns you may have about quitting, including weight gain. NICOTINE: A POWERFUL ADDICTION If you have tried to quit smoking, you know how hard it can be. It is hard because nicotine is a very addictive drug. For some people, it can be as addictive as heroin or cocaine. Usually, people make 2 or 3 tries, or more, before finally being able to quit. Each time you try to quit, you can learn about what helps and what hurts. Quitting takes hard work and a lot of effort, but you can quit smoking. QUITTING SMOKING IS ONE OF THE MOST IMPORTANT THINGS YOU WILL EVER DO.  You will live longer, feel better, and live better.   The impact on your body of quitting smoking is felt almost immediately:   Within 20 minutes, blood  pressure decreases. Pulse returns to its normal level.   After 8 hours, carbon monoxide levels in the blood return to normal. Oxygen level increases.   After 24 hours, chance of heart attack starts to decrease. Breath, hair, and body stop smelling like smoke.   After 48 hours, damaged nerve endings begin to recover. Sense of taste and smell improve.   After 72 hours, the body is virtually free of nicotine. Bronchial tubes relax and breathing becomes easier.   After 2 to 12 weeks, lungs can hold more air. Exercise becomes easier and circulation improves.   Quitting will reduce your risk of having a heart attack, stroke, cancer, or lung disease:   After 1 year, the risk of coronary heart disease is cut in half.   After 5 years, the risk of stroke falls to the same as a nonsmoker.   After 10 years, the risk of lung cancer is cut in half and the risk of other cancers decreases significantly.   After 15 years, the risk of coronary heart disease drops, usually to the level of a nonsmoker.   If you are pregnant, quitting smoking will improve your chances of having a healthy baby.   The people you live with, especially your children, will be healthier.   You will have extra money to spend on things other than cigarettes.  FIVE KEYS TO QUITTING Studies have shown that these 5 steps will help you quit smoking and quit for good. You have the best chances of quitting if you use them together: 1. Get ready.  2. Get support and encouragement.  3. Learn new skills and behaviors.  4. Get medicine to reduce your nicotine addiction and use it correctly.  5. Be prepared for relapse or difficult situations. Be determined to continue trying to quit, even if you do not succeed at first.  1. GET READY  Set a quit date.   Change your environment.   Get rid of ALL cigarettes, ashtrays, matches, and lighters in your home, car, and place of work.   Do not let people smoke in your home.   Review your  past attempts to quit. Think about what worked and what did not.   Once you quit, do not smoke. NOT EVEN A PUFF!    2. GET SUPPORT AND ENCOURAGEMENT Studies have shown that you have a better chance of being successful if you have help. You can get support in many ways.  Tell your family, friends, and coworkers that you are going to quit and need their support. Ask them not to smoke around you.   Talk to your caregivers (doctor, dentist, nurse, pharmacist, psychologist, and/or smoking counselor).   Get individual, group, or telephone counseling and support. The more counseling you have, the better your chances are of quitting. Programs are available at local hospitals and health centers. Call your local health department for information about programs in your area.   Spiritual beliefs and practices may help some smokers quit.   Quit meters are small computer programs online or downloadable that keep track of quit statistics, such as amount of "quit-time," cigarettes not smoked, and money saved.   Many smokers find one or more of the many self-help books available useful in helping them quit and stay off tobacco.  3. LEARN NEW SKILLS AND BEHAVIORS  Try to distract yourself from urges to smoke. Talk to someone, go for a walk, or occupy your time with a task.   When you first try to quit, change your routine. Take a different route to work. Drink tea instead of coffee. Eat breakfast in a different place.   Do something to reduce your stress. Take a hot bath, exercise, or read a book.   Plan something enjoyable to do every day. Reward yourself for not smoking.   Explore interactive web-based programs that specialize in helping you quit.  4. GET MEDICINE AND USE IT CORRECTLY Medicines can help you stop smoking and decrease the urge to smoke. Combining medicine with the above behavioral methods and support can quadruple your chances of successfully quitting smoking. The [place] Food and Drug  Administration (FDA) has approved 7 medicines to help you quit smoking. These medicines fall into 3 categories.  Nicotine replacement therapy (delivers nicotine to your body without the negative effects and risks of smoking):   Nicotine gum: Available over-the-counter.   Nicotine lozenges: Available over-the-counter.   Nicotine inhaler: Available by prescription.   Nicotine nasal spray: Available by prescription.   Nicotine skin patches (transdermal): Available by prescription and over-the-counter.   Antidepressant medicine (helps people abstain from smoking, but how this works is unknown):   Bupropion sustained-release (SR) tablets: Available by prescription.   Nicotinic receptor partial agonist (simulates the effect of nicotine in your brain):   Varenicline tartrate tablets: Available by prescription.   Ask your caregiver for advice about which medicines to use and how to use them. Carefully read the information on the package.   Everyone who is trying to quit may benefit from using a medicine. If you are pregnant or trying to become pregnant, nursing an infant, you are under age 18, or you smoke fewer than 10 cigarettes per day, talk to your caregiver before taking any nicotine replacement medicines.   You should stop using a nicotine replacement product and call your caregiver if you experience nausea, dizziness, weakness, vomiting, fast or irregular heartbeat, mouth problems with the lozenge or gum, or redness or swelling of the skin around the patch that does not go away.   Do not use any other product containing nicotine while using a nicotine replacement product.   Talk to your caregiver before using these products if you have diabetes, heart disease, asthma, stomach ulcers, you had a recent heart attack, you have high blood pressure   that is not controlled with medicine, a history of irregular heartbeat, or you have been prescribed medicine to help you quit smoking.  5. BE  PREPARED FOR RELAPSE OR DIFFICULT SITUATIONS  Most relapses occur within the first 3 months after quitting. Do not be discouraged if you start smoking again. Remember, most people try several times before they finally quit.   You may have symptoms of withdrawal because your body is used to nicotine. You may crave cigarettes, be irritable, feel very hungry, cough often, get headaches, or have difficulty concentrating.   The withdrawal symptoms are only temporary. They are strongest when you first quit, but they will go away within 10 to 14 days.  Here are some difficult situations to watch for:  Alcohol. Avoid drinking alcohol. Drinking lowers your chances of successfully quitting.   Caffeine. Try to reduce the amount of caffeine you consume. It also lowers your chances of successfully quitting.   Other smokers. Being around smoking can make you want to smoke. Avoid smokers.   Weight gain. Many smokers will gain weight when they quit, usually less than 10 pounds. Eat a healthy diet and stay active. Do not let weight gain distract you from your main goal, quitting smoking. Some medicines that help you quit smoking may also help delay weight gain. You can always lose the weight gained after you quit.   Bad mood or depression. There are a lot of ways to improve your mood other than smoking.  If you are having problems with any of these situations, talk to your caregiver. SPECIAL SITUATIONS AND CONDITIONS Studies suggest that everyone can quit smoking. Your situation or condition can give you a special reason to quit.  Pregnant women/new mothers: By quitting, you protect your baby's health and your own.   Hospitalized patients: By quitting, you reduce health problems and help healing.   Heart attack patients: By quitting, you reduce your risk of a second heart attack.   Lung, head, and neck cancer patients: By quitting, you reduce your chance of a second cancer.   Parents of children and  adolescents: By quitting, you protect your children from illnesses caused by secondhand smoke.  QUESTIONS TO THINK ABOUT Think about the following questions before you try to stop smoking. You may want to talk about your answers with your caregiver.  Why do you want to quit?   If you tried to quit in the past, what helped and what did not?   What will be the most difficult situations for you after you quit? How will you plan to handle them?   Who can help you through the tough times? Your family? Friends? Caregiver?   What pleasures do you get from smoking? What ways can you still get pleasure if you quit?  Here are some questions to ask your caregiver:  How can you help me to be successful at quitting?   What medicine do you think would be best for me and how should I take it?   What should I do if I need more help?   What is smoking withdrawal like? How can I get information on withdrawal?  Quitting takes hard work and a lot of effort, but you can quit smoking. FOR MORE INFORMATION  Smokefree.gov (http://www.smokefree.gov) provides free, accurate, evidence-based information and professional assistance to help support the immediate and long-term needs of people trying to quit smoking. Document Released: 05/11/2001 Document Revised: 01/27/2011 Document Reviewed: 03/03/2009 [employer] Patient Information 2012 [employer]. 

## 2011-07-22 ENCOUNTER — Telehealth: Payer: Self-pay | Admitting: *Deleted

## 2011-07-22 DIAGNOSIS — Z1231 Encounter for screening mammogram for malignant neoplasm of breast: Secondary | ICD-10-CM

## 2011-07-22 NOTE — Telephone Encounter (Signed)
Changed mammo order to screening rather than diagnostic

## 2011-07-29 ENCOUNTER — Encounter: Payer: Managed Care, Other (non HMO) | Admitting: Family Medicine

## 2011-08-10 ENCOUNTER — Inpatient Hospital Stay: Admission: RE | Admit: 2011-08-10 | Payer: Managed Care, Other (non HMO) | Source: Ambulatory Visit

## 2011-08-13 ENCOUNTER — Telehealth: Payer: Self-pay | Admitting: *Deleted

## 2011-08-13 DIAGNOSIS — G8929 Other chronic pain: Secondary | ICD-10-CM

## 2011-08-13 MED ORDER — OXYCODONE HCL 15 MG PO TABS: 22.5000 mg | ORAL_TABLET | Freq: Four times a day (QID) | ORAL | Status: DC | PRN

## 2011-08-13 NOTE — Telephone Encounter (Signed)
Ok to fill, I think she is due on the 19th.

## 2011-08-13 NOTE — Telephone Encounter (Signed)
Pt states that her sister passed away today and she will have to go out of town for the week and would like to know if we can go ahead and give her a hard copy of her pain med and [place]. Pt states that she will be going out of town this weekend. Please advise.

## 2011-08-13 NOTE — Telephone Encounter (Signed)
Pt informed to p/u rx

## 2011-08-17 ENCOUNTER — Ambulatory Visit: Payer: Managed Care, Other (non HMO)

## 2011-08-17 ENCOUNTER — Other Ambulatory Visit: Payer: Managed Care, Other (non HMO)

## 2011-09-06 ENCOUNTER — Telehealth: Payer: Self-pay | Admitting: *Deleted

## 2011-09-06 NOTE — Telephone Encounter (Signed)
Pt states that she seen commercial on TV for Lyrica. Pt would like to know if we have samples of Lyrica for her to try b/c she still is having pain even with the meds she is on. Please advise.

## 2011-09-07 NOTE — Telephone Encounter (Signed)
Pt will do the Neurotin 600mg  3 times day. Informed pt when she needed refill to call back so we can refill enough for 3x day. She also wants to know if you would do cortisone shots for her Lt hip and thigh.

## 2011-09-07 NOTE — Telephone Encounter (Signed)
We can increase her Neurontin to 3 times a day. Unfortunately if this in the hip joint itself usually the orthopedic has to inject those. Sometimes there is a trigger point or bursa superficially. If she's not sure we would be glad to see and evaluate her and refer if needed.

## 2011-09-07 NOTE — Telephone Encounter (Signed)
We do not have a lipid samples at this time. I have found that this medication is not inexpensive and unless she has insurance that will cover it she will be paying the majority out-of-pocket. You do have my permission to check the sample closet to make sure. My suggestion since Lyrica would replace Neurontin that we increase her Neurontin 600 mg one twice a day to 3 times a day.

## 2011-09-08 NOTE — Telephone Encounter (Signed)
Pt notiifed 

## 2011-09-10 ENCOUNTER — Encounter: Payer: Self-pay | Admitting: *Deleted

## 2011-09-14 ENCOUNTER — Ambulatory Visit (INDEPENDENT_AMBULATORY_CARE_PROVIDER_SITE_OTHER): Payer: Self-pay | Admitting: Family Medicine

## 2011-09-14 ENCOUNTER — Encounter: Payer: Self-pay | Admitting: Family Medicine

## 2011-09-14 VITALS — BP 173/87 | HR 80 | Ht 64.0 in | Wt 130.0 lb

## 2011-09-14 DIAGNOSIS — M461 Sacroiliitis, not elsewhere classified: Secondary | ICD-10-CM

## 2011-09-14 DIAGNOSIS — M7070 Other bursitis of hip, unspecified hip: Secondary | ICD-10-CM

## 2011-09-14 DIAGNOSIS — I10 Essential (primary) hypertension: Secondary | ICD-10-CM

## 2011-09-14 DIAGNOSIS — G8929 Other chronic pain: Secondary | ICD-10-CM

## 2011-09-14 DIAGNOSIS — M79609 Pain in unspecified limb: Secondary | ICD-10-CM

## 2011-09-14 DIAGNOSIS — M549 Dorsalgia, unspecified: Secondary | ICD-10-CM

## 2011-09-14 MED ORDER — LISINOPRIL-HYDROCHLOROTHIAZIDE 10-12.5 MG PO TABS: 1.0000 | ORAL_TABLET | Freq: Every day | ORAL | Status: DC

## 2011-09-14 MED ORDER — OXYCODONE HCL 15 MG PO TABS: 22.5000 mg | ORAL_TABLET | Freq: Four times a day (QID) | ORAL | Status: DC | PRN

## 2011-09-14 NOTE — Assessment & Plan Note (Addendum)
Trigger Point Injection   Pre-operative diagnosis: myofascial pain, trigger point, bursitis, sacroiliitis  Post-operative diagnosis: myofascial pain trigger point, bursitis, and sacroiliitis,  After risks and benefits were explained including bleeding, infection, worsening of the pain, damage to the area being injected, weakness, allergic reaction to medications, vascular injection, and nerve damage, all questions were answered.    The area of the trigger point was identified and the skin prepped three times with Betadine and the Betadine  allowed to dry.  Next, a 22 gauge 1 inch needle was placed in the area of the trigger point, the bursa and/or joint  space.  Once reproduction of the pain was elicited and negative aspiration confirmed, the trigger point, joint space , bursa was injected and the needle removed.    The patient did  tolerate the procedure well and there were not complications.    Medication used: 80 mg  Decadron 1 mL, marcaine .5% 7ml, and 30 mg of toradol 1 ml total.                                  .,                                                                                                                        Trigger points injected :2, sacral iliac joint space/bursitis  : 3     location(s):  left thigh trigger point and left iliac-sacral bursa/joint space   Patient reports marked improvement after injections.

## 2011-09-14 NOTE — Progress Notes (Signed)
  Subjective:    Patient ID: [name], female    DOB: 06/29/1950, 61 y.o.   MRN: [id number]  HPI  History hypertension poorly controlled at this time.  Chronic backache back pain patient request for pain medication at this time. Back and left thigh pain. Patient wants injections of steroids if possible Situation stress husband has recently lost his job and insurance.   Review of Systems    BP 173/87  Pulse 80  Ht 5\' 4"  (1.626 m)  Wt 130 lb (58.968 kg)  BMI 22.31 kg/m2  SpO2 96% Objective:   Physical Exam  Constitutional: She appears well-developed and well-nourished.  Musculoskeletal:       Over the left sacral iliac joint space for several remarkably tender areas at least 3 different sites. Over the left thigh there were 2 triggerpoints markedly tender to palpation.   Neurological: She is alert.  Skin: Skin is warm and dry.  Psychiatric: She has a normal mood and affect.          Assessment & Plan:      #1 Hypertension poorly controlled DC lisinopril 10 mg and will change to lisinopril 10/12.5 one tablet a day. Return in 2-4 months for followup.  #2 chronic back pain. We'll renew her chronic pain medication Roxicodone 15 mg 1 to 1.5 tablets po  #3 sacroiliitis/bursitis of the left sacral iliac joint and trigger points of the left thigh injected please see procedure note patient tolerated procedure well.

## 2011-09-14 NOTE — Patient Instructions (Signed)
Bursitis  Bursitis is a swelling and soreness (inflammation) of a fluid-filled sac (bursa) that overlies and protects a joint. It can be caused by injury, overuse of the joint, arthritis or infection. The joints most likely to be affected are the elbows, shoulders, hips and knees.  HOME CARE INSTRUCTIONS    Apply ice to the affected area for 15 to 20 minutes each hour while awake for 2 days. Put the ice in a plastic bag and place a towel between the bag of ice and your skin.   Rest the injured joint as much as possible, but continue to put the joint through a full range of motion, 4 times per day. (The shoulder joint especially becomes rapidly "frozen" if not used.) When the pain lessens, begin normal slow movements and usual activities.   Only take over-the-counter or prescription medicines for pain, discomfort or fever as directed by your caregiver.   Your caregiver may recommend draining the bursa and injecting medicine into the bursa. This may help the healing process.   Follow all instructions for follow-up with your caregiver. This includes any orthopedic referrals, physical therapy and rehabilitation. Any delay in obtaining necessary care could result in a delay or failure of the bursitis to heal and chronic pain.  SEEK IMMEDIATE MEDICAL CARE IF:    Your pain increases even during treatment.   You develop an oral temperature above 102 F (38.9 C) and have heat and inflammation over the involved bursa.  MAKE SURE YOU:    Understand these instructions.   Will watch your condition.   Will get help right away if you are not doing well or get worse.  Document Released: 05/14/2000 Document Revised: 05/06/2011 Document Reviewed: 04/18/2009  [employer] Patient Information 2012 [employer].

## 2011-09-21 ENCOUNTER — Other Ambulatory Visit: Payer: Self-pay | Admitting: Family Medicine

## 2011-09-27 ENCOUNTER — Other Ambulatory Visit: Payer: Self-pay | Admitting: *Deleted

## 2011-09-27 DIAGNOSIS — F419 Anxiety disorder, unspecified: Secondary | ICD-10-CM

## 2011-09-27 MED ORDER — ALPRAZOLAM 1 MG PO TABS: 1.0000 mg | ORAL_TABLET | Freq: Two times a day (BID) | ORAL | Status: DC | PRN

## 2011-10-12 ENCOUNTER — Encounter: Payer: Self-pay | Admitting: Family Medicine

## 2011-10-12 ENCOUNTER — Ambulatory Visit (INDEPENDENT_AMBULATORY_CARE_PROVIDER_SITE_OTHER): Payer: Self-pay | Admitting: Family Medicine

## 2011-10-12 VITALS — BP 144/86 | HR 101 | Temp 98.4°F | Ht 64.0 in | Wt 127.0 lb

## 2011-10-12 DIAGNOSIS — G8929 Other chronic pain: Secondary | ICD-10-CM

## 2011-10-12 DIAGNOSIS — J329 Chronic sinusitis, unspecified: Secondary | ICD-10-CM

## 2011-10-12 MED ORDER — AMOXICILLIN-POT CLAVULANATE 875-125 MG PO TABS: 1.0000 | ORAL_TABLET | Freq: Two times a day (BID) | ORAL | Status: AC

## 2011-10-12 MED ORDER — OXYCODONE HCL 15 MG PO TABS: 22.5000 mg | ORAL_TABLET | Freq: Four times a day (QID) | ORAL | Status: DC | PRN

## 2011-10-12 MED ORDER — FLUTICASONE PROPIONATE 50 MCG/ACT NA SUSP: 2.0000 | Freq: Every day | NASAL | Status: DC

## 2011-10-12 MED ORDER — GABAPENTIN 600 MG PO TABS: 600.0000 mg | ORAL_TABLET | Freq: Three times a day (TID) | ORAL | Status: DC

## 2011-10-12 NOTE — Progress Notes (Signed)
  Subjective:    Patient ID: [name], female    DOB: Nov 10, 1950, 61 y.o.   MRN: [id number]  Sinusitis This is a new problem. The current episode started in the past 7 days (Started Wednesday). The problem has been gradually worsening since onset. There has been no fever. Her pain is at a severity of 8/10. The pain is moderate. Associated symptoms include chills, congestion, ear pain, headaches, a hoarse voice, shortness of breath, sinus pressure and swollen glands. Pertinent negatives include no coughing or sore throat. The treatment provided no relief.   Problem #2 patient request her pain medication refilled. She is taking Neurontin 600 mg and we've increased it from twice a day to 3 times a day him. The cost of the Lyrica was prohibitive. She also wants refill of Roxicodone. She states that she's had taken a few tablets so she has run out of her medication but I've explained to her that she was out she was try to manage without it. When she gets insurance will be glad to send her back to pain Dr./Clinic for reevaluation. But at this present time 180 pain pills on the 15th is all I can prescribe.  Review of Systems  Constitutional: Positive for chills.  HENT: Positive for ear pain, congestion, hoarse voice and sinus pressure. Negative for sore throat.   Respiratory: Positive for shortness of breath. Negative for cough.   Neurological: Positive for headaches.      BP 144/86  Pulse 101  Temp(Src) 98.4 F (36.9 C) (Oral)  Ht 5\' 4"  (1.626 m)  Wt 127 lb (57.607 kg)  BMI 21.80 kg/m2  SpO2 99% Objective:   Physical Exam  Constitutional: She appears well-developed and well-nourished.  HENT:  Head: Normocephalic.  Right Ear: Hearing, tympanic membrane and external ear normal.  Left Ear: Hearing, tympanic membrane and external ear normal.  Nose: Mucosal edema, rhinorrhea and nasal deformity present. Right sinus exhibits frontal sinus tenderness. Left sinus exhibits frontal sinus  tenderness.  Eyes: Pupils are equal, round, and reactive to light.  Neck: Normal range of motion. Neck supple.      Assessment & Plan:  #1 Sinusitis. Continue with her Claritin-D. Place her on Augmentin 875 for 10 days and Flonase nasal spray 2 puffs each nostril daily. #2 pain medication. We knew her Neurontin at the higher amount 2 capsules 3 times a day or 180 a month and renew her Roxicet #180 as well.  Return in 4 months followup

## 2011-10-12 NOTE — Patient Instructions (Signed)
Smoking Cessation This document explains the best ways for you to quit smoking and new treatments to help. It lists new medicines that can double or triple your chances of quitting and quitting for good. It also considers ways to avoid relapses and concerns you may have about quitting, including weight gain. NICOTINE: A POWERFUL ADDICTION If you have tried to quit smoking, you know how hard it can be. It is hard because nicotine is a very addictive drug. For some people, it can be as addictive as heroin or cocaine. Usually, people make 2 or 3 tries, or more, before finally being able to quit. Each time you try to quit, you can learn about what helps and what hurts. Quitting takes hard work and a lot of effort, but you can quit smoking. QUITTING SMOKING IS ONE OF THE MOST IMPORTANT THINGS YOU WILL EVER DO.  You will live longer, feel better, and live better.   The impact on your body of quitting smoking is felt almost immediately:   Within 20 minutes, blood pressure decreases. Pulse returns to its normal level.   After 8 hours, carbon monoxide levels in the blood return to normal. Oxygen level increases.   After 24 hours, chance of heart attack starts to decrease. Breath, hair, and body stop smelling like smoke.   After 48 hours, damaged nerve endings begin to recover. Sense of taste and smell improve.   After 72 hours, the body is virtually free of nicotine. Bronchial tubes relax and breathing becomes easier.   After 2 to 12 weeks, lungs can hold more air. Exercise becomes easier and circulation improves.   Quitting will reduce your risk of having a heart attack, stroke, cancer, or lung disease:   After 1 year, the risk of coronary heart disease is cut in half.   After 5 years, the risk of stroke falls to the same as a nonsmoker.   After 10 years, the risk of lung cancer is cut in half and the risk of other cancers decreases significantly.   After 15 years, the risk of coronary heart  disease drops, usually to the level of a nonsmoker.   If you are pregnant, quitting smoking will improve your chances of having a healthy baby.   The people you live with, especially your children, will be healthier.   You will have extra money to spend on things other than cigarettes.  FIVE KEYS TO QUITTING Studies have shown that these 5 steps will help you quit smoking and quit for good. You have the best chances of quitting if you use them together: 1. Get ready.  2. Get support and encouragement.  3. Learn new skills and behaviors.  4. Get medicine to reduce your nicotine addiction and use it correctly.  5. Be prepared for relapse or difficult situations. Be determined to continue trying to quit, even if you do not succeed at first.  1. GET READY  Set a quit date.   Change your environment.   Get rid of ALL cigarettes, ashtrays, matches, and lighters in your home, car, and place of work.   Do not let people smoke in your home.   Review your past attempts to quit. Think about what worked and what did not.   Once you quit, do not smoke. NOT EVEN A PUFF!  2. GET SUPPORT AND ENCOURAGEMENT Studies have shown that you have a better chance of being successful if you have help. You can get support in many ways.  Tell   your family, friends, and coworkers that you are going to quit and need their support. Ask them not to smoke around you.   Talk to your caregivers (doctor, dentist, nurse, pharmacist, psychologist, and/or smoking counselor).   Get individual, group, or telephone counseling and support. The more counseling you have, the better your chances are of quitting. Programs are available at local hospitals and health centers. Call your local health department for information about programs in your area.   Spiritual beliefs and practices may help some smokers quit.   Quit meters are small computer programs online or downloadable that keep track of quit statistics, such as amount  of "quit-time," cigarettes not smoked, and money saved.   Many smokers find one or more of the many self-help books available useful in helping them quit and stay off tobacco.  3. LEARN NEW SKILLS AND BEHAVIORS  Try to distract yourself from urges to smoke. Talk to someone, go for a walk, or occupy your time with a task.   When you first try to quit, change your routine. Take a different route to work. Drink tea instead of coffee. Eat breakfast in a different place.   Do something to reduce your stress. Take a hot bath, exercise, or read a book.   Plan something enjoyable to do every day. Reward yourself for not smoking.   Explore interactive web-based programs that specialize in helping you quit.  4. GET MEDICINE AND USE IT CORRECTLY Medicines can help you stop smoking and decrease the urge to smoke. Combining medicine with the above behavioral methods and support can quadruple your chances of successfully quitting smoking. The [place] Food and Drug Administration (FDA) has approved 7 medicines to help you quit smoking. These medicines fall into 3 categories.  Nicotine replacement therapy (delivers nicotine to your body without the negative effects and risks of smoking):   Nicotine gum: Available over-the-counter.   Nicotine lozenges: Available over-the-counter.   Nicotine inhaler: Available by prescription.   Nicotine nasal spray: Available by prescription.   Nicotine skin patches (transdermal): Available by prescription and over-the-counter.   Antidepressant medicine (helps people abstain from smoking, but how this works is unknown):   Bupropion sustained-release (SR) tablets: Available by prescription.   Nicotinic receptor partial agonist (simulates the effect of nicotine in your brain):   Varenicline tartrate tablets: Available by prescription.   Ask your caregiver for advice about which medicines to use and how to use them. Carefully read the information on the package.    Everyone who is trying to quit may benefit from using a medicine. If you are pregnant or trying to become pregnant, nursing an infant, you are under age 18, or you smoke fewer than 10 cigarettes per day, talk to your caregiver before taking any nicotine replacement medicines.   You should stop using a nicotine replacement product and call your caregiver if you experience nausea, dizziness, weakness, vomiting, fast or irregular heartbeat, mouth problems with the lozenge or gum, or redness or swelling of the skin around the patch that does not go away.   Do not use any other product containing nicotine while using a nicotine replacement product.   Talk to your caregiver before using these products if you have diabetes, heart disease, asthma, stomach ulcers, you had a recent heart attack, you have high blood pressure that is not controlled with medicine, a history of irregular heartbeat, or you have been prescribed medicine to help you quit smoking.  5. BE PREPARED FOR RELAPSE OR   DIFFICULT SITUATIONS  Most relapses occur within the first 3 months after quitting. Do not be discouraged if you start smoking again. Remember, most people try several times before they finally quit.   You may have symptoms of withdrawal because your body is used to nicotine. You may crave cigarettes, be irritable, feel very hungry, cough often, get headaches, or have difficulty concentrating.   The withdrawal symptoms are only temporary. They are strongest when you first quit, but they will go away within 10 to 14 days.  Here are some difficult situations to watch for:  Alcohol. Avoid drinking alcohol. Drinking lowers your chances of successfully quitting.   Caffeine. Try to reduce the amount of caffeine you consume. It also lowers your chances of successfully quitting.   Other smokers. Being around smoking can make you want to smoke. Avoid smokers.   Weight gain. Many smokers will gain weight when they quit, usually  less than 10 pounds. Eat a healthy diet and stay active. Do not let weight gain distract you from your main goal, quitting smoking. Some medicines that help you quit smoking may also help delay weight gain. You can always lose the weight gained after you quit.   Bad mood or depression. There are a lot of ways to improve your mood other than smoking.  If you are having problems with any of these situations, talk to your caregiver. SPECIAL SITUATIONS AND CONDITIONS Studies suggest that everyone can quit smoking. Your situation or condition can give you a special reason to quit.  Pregnant women/new mothers: By quitting, you protect your baby's health and your own.   Hospitalized patients: By quitting, you reduce health problems and help healing.   Heart attack patients: By quitting, you reduce your risk of a second heart attack.   Lung, head, and neck cancer patients: By quitting, you reduce your chance of a second cancer.   Parents of children and adolescents: By quitting, you protect your children from illnesses caused by secondhand smoke.  QUESTIONS TO THINK ABOUT Think about the following questions before you try to stop smoking. You may want to talk about your answers with your caregiver.  Why do you want to quit?   If you tried to quit in the past, what helped and what did not?   What will be the most difficult situations for you after you quit? How will you plan to handle them?   Who can help you through the tough times? Your family? Friends? Caregiver?   What pleasures do you get from smoking? What ways can you still get pleasure if you quit?  Here are some questions to ask your caregiver:  How can you help me to be successful at quitting?   What medicine do you think would be best for me and how should I take it?   What should I do if I need more help?   What is smoking withdrawal like? How can I get information on withdrawal?  Quitting takes hard work and a lot of effort,  but you can quit smoking. FOR MORE INFORMATION  Smokefree.gov (http://www.smokefree.gov) provides free, accurate, evidence-based information and professional assistance to help support the immediate and long-term needs of people trying to quit smoking. Document Released: 05/11/2001 Document Revised: 05/06/2011 Document Reviewed: 03/03/2009 [employer] Patient Information 2012 [employer].Sinusitis Sinuses are air pockets within the bones of your face. The growth of bacteria within a sinus leads to infection. The infection prevents the sinuses from draining. This infection is called   sinusitis. SYMPTOMS   There will be different areas of pain depending on which sinuses have become infected.  The maxillary sinuses often produce pain beneath the eyes.   Frontal sinusitis may cause pain in the middle of the forehead and above the eyes.  Other problems (symptoms) include:  Toothaches.   Colored, pus-like (purulent) drainage from the nose.   Swelling, warmth, and tenderness over the sinus areas may be signs of infection.  TREATMENT   Sinusitis is most often determined by an exam.X-rays may be taken. If x-rays have been taken, make sure you obtain your results or find out how you are to obtain them. Your caregiver may give you medications (antibiotics). These are medications that will help kill the bacteria causing the infection. You may also be given a medication (decongestant) that helps to reduce sinus swelling.   HOME CARE INSTRUCTIONS    Only take over-the-counter or prescription medicines for pain, discomfort, or fever as directed by your caregiver.   Drink extra fluids. Fluids help thin the mucus so your sinuses can drain more easily.   Applying either moist heat or ice packs to the sinus areas may help relieve discomfort.   Use saline nasal sprays to help moisten your sinuses. The sprays can be found at your local drugstore.  SEEK IMMEDIATE MEDICAL CARE IF:  You have a fever.   You  have increasing pain, severe headaches, or toothache.   You have nausea, vomiting, or drowsiness.   You develop unusual swelling around the face or trouble seeing.  MAKE SURE YOU:    Understand these instructions.   Will watch your condition.   Will get help right away if you are not doing well or get worse.  Document Released: 05/17/2005 Document Revised: 05/06/2011 Document Reviewed: 12/14/2006 [employer] Patient Information 2012 [employer]. 

## 2011-10-21 ENCOUNTER — Other Ambulatory Visit: Payer: Self-pay | Admitting: *Deleted

## 2011-10-21 NOTE — Telephone Encounter (Signed)
Did she not get it it refilled 10/21/2011?

## 2011-10-21 NOTE — Telephone Encounter (Signed)
Pt called and request her xanax rx.

## 2011-10-26 ENCOUNTER — Other Ambulatory Visit: Payer: Self-pay | Admitting: *Deleted

## 2011-10-26 DIAGNOSIS — K219 Gastro-esophageal reflux disease without esophagitis: Secondary | ICD-10-CM

## 2011-10-26 MED ORDER — ESOMEPRAZOLE MAGNESIUM 40 MG PO CPDR: 40.0000 mg | DELAYED_RELEASE_CAPSULE | Freq: Every day | ORAL | Status: DC

## 2011-10-28 ENCOUNTER — Other Ambulatory Visit: Payer: Self-pay | Admitting: *Deleted

## 2011-10-28 DIAGNOSIS — F419 Anxiety disorder, unspecified: Secondary | ICD-10-CM

## 2011-10-28 MED ORDER — ALPRAZOLAM 1 MG PO TABS: 1.0000 mg | ORAL_TABLET | Freq: Two times a day (BID) | ORAL | Status: DC | PRN

## 2011-11-03 ENCOUNTER — Telehealth: Payer: Self-pay | Admitting: *Deleted

## 2011-11-03 DIAGNOSIS — G8929 Other chronic pain: Secondary | ICD-10-CM

## 2011-11-03 NOTE — Telephone Encounter (Signed)
Pt request a referral to pain clinic. Not Dr. Jeani Hawking officenor [facility]

## 2011-11-04 NOTE — Telephone Encounter (Signed)
Referral entered. KG LPN

## 2011-11-04 NOTE — Telephone Encounter (Signed)
That will be fine.   Thank you

## 2011-11-11 ENCOUNTER — Encounter: Payer: Self-pay | Admitting: Family Medicine

## 2011-11-11 ENCOUNTER — Ambulatory Visit (INDEPENDENT_AMBULATORY_CARE_PROVIDER_SITE_OTHER): Payer: Medicare Other | Admitting: Family Medicine

## 2011-11-11 VITALS — BP 137/85 | HR 94 | Ht 64.0 in | Wt 125.0 lb

## 2011-11-11 DIAGNOSIS — G8929 Other chronic pain: Secondary | ICD-10-CM

## 2011-11-11 MED ORDER — OXYCODONE HCL 15 MG PO TABS: 22.5000 mg | ORAL_TABLET | Freq: Four times a day (QID) | ORAL | Status: DC | PRN

## 2011-11-11 NOTE — Progress Notes (Signed)
  Subjective:    Patient ID: [name], female    DOB: 01/24/1951, 61 y.o.   MRN: [id number]  HPI Patient reports having pain attack. Apparently she stopped taking the Neurontin because when she tried to increase the dosage of her Neurontin she had increase side effects. Instead of her tapering herself off of the medication or reducing the medication to the level where she had no problems she stopped the medication completely. Subsequently she had marked neuropathy type pain which made her family panic and worry about what was going on. She says when he has used up more pain medication than she normally takes and find herself 2 days ago her medications.  Should be noted that she has stopped her Lipitor for her hyperlipidemia she still taking her Synthroid. Apparently she was placed on blood pressure medication and she also stopped that as well.   Review of Systems  Constitutional: Positive for appetite change and fatigue. Negative for fever.  Musculoskeletal: Positive for myalgias and arthralgias.  Neurological: Positive for weakness.      BP 137/85  Pulse 94  Ht 5\' 4"  (1.626 m)  Wt 125 lb (56.7 kg)  BMI 21.46 kg/m2  SpO2 99% Objective:   Physical Exam  Vitals reviewed. Constitutional: She is oriented to person, place, and time. She appears well-developed.  HENT:  Head: Normocephalic.  Cardiovascular: Normal rate and regular rhythm.   Pulmonary/Chest: Effort normal and breath sounds normal.  Neurological: She is alert and oriented to person, place, and time.  Skin: Skin is warm and dry.  Psychiatric: Her behavior is normal.       Assessment & Plan:  #1 chronic pain management will give her 12 tablets since she takes an average of 6 pills a day in the form of one and a half pills 4 times a day stressed to her that this is an exception. #2 we'll need to followup with her diabetes and her next visit  #3 concern over her self medicating and not taking her cholesterol medication  or her blood pressure medicine anymore and will followup when she returns. #4 neuropathy pain. Stressed to her by not stopping the Neurontin as directed i.e. with reduction in dosage or going to the lowest tolerable dosage she experienced discomfort and stress that she really didn't have to have.

## 2011-11-11 NOTE — Patient Instructions (Signed)
Neuropathy Neuropathy means your peripheral nerves are not working normally. Peripheral nerves are the nerves outside the brain and spinal cord. Messages between the brain and the rest of the body do not work properly with peripheral nerve disorders. CAUSES There are many different causes of peripheral nerve disorders. These include:  Injury.   Infections.   Diabetes.   Vitamin deficiency.   Poor circulation.   Alcoholism.   Exposure to toxins.   Drug effects.   Tumors.   Kidney disease.  SYMPTOMS  Tingling, burning, pain, and numbness in the extremities.   Weakness and loss of muscle tone and size.  DIAGNOSIS Blood tests and special studies of nerve function may help confirm the diagnosis.  TREATMENT  Treatment includes adopting healthy life habits.   A good diet, vitamin supplements, and mild pain medicine may be needed.   Avoid known toxins such as alcohol, tobacco, and recreational drugs.   Anti-convulsant medicines are helpful in some types of neuropathy.  Make a follow-up appointment with your caregiver to be sure you are getting better with treatment.  SEEK IMMEDIATE MEDICAL CARE IF:   You have breathing problems.   You have severe or uncontrolled pain.   You notice extreme weakness or you feel faint.   You are not better after 1 week or if you have worse symptoms.  Document Released: 06/24/2004 Document Revised: 01/27/2011 Document Reviewed: 05/17/2005 [employer] Patient Information 2012 [employer]. 

## 2011-11-23 ENCOUNTER — Telehealth: Payer: Self-pay | Admitting: *Deleted

## 2011-11-23 NOTE — Telephone Encounter (Signed)
Pt called stating that she was in extreme pain saying that she can't hardly even stand up. Advised pt to go to ED.

## 2011-11-23 NOTE — Telephone Encounter (Signed)
I agree

## 2011-11-25 ENCOUNTER — Other Ambulatory Visit: Payer: Self-pay | Admitting: Family Medicine

## 2011-11-29 ENCOUNTER — Telehealth: Payer: Self-pay | Admitting: *Deleted

## 2011-11-29 NOTE — Telephone Encounter (Signed)
Needs to call the pain clinic then and let them know what happened adn see if they want to rx something.

## 2011-11-29 NOTE — Telephone Encounter (Signed)
Pt has called wanting to know if there is something she can take for withdrawals b/c she let her son's friend stay a few weeks and states when she left that her pain meds went missing. She doesn't go back to pain clinic until the 19th. Please advise.

## 2011-11-29 NOTE — Telephone Encounter (Signed)
Pt informed

## 2011-12-08 ENCOUNTER — Encounter: Payer: Self-pay | Admitting: Physician Assistant

## 2011-12-08 ENCOUNTER — Ambulatory Visit (INDEPENDENT_AMBULATORY_CARE_PROVIDER_SITE_OTHER): Payer: Medicare Other | Admitting: Physician Assistant

## 2011-12-08 VITALS — BP 126/78 | HR 90 | Temp 98.3°F | Ht 64.0 in | Wt 124.0 lb

## 2011-12-08 DIAGNOSIS — J441 Chronic obstructive pulmonary disease with (acute) exacerbation: Secondary | ICD-10-CM

## 2011-12-08 MED ORDER — CHLORPHENIRAMINE-HYDROCODONE 8-10 MG/5ML PO LQCR: 5.0000 mL | Freq: Every evening | ORAL | Status: DC | PRN

## 2011-12-08 MED ORDER — AZITHROMYCIN 250 MG PO TABS: ORAL_TABLET | ORAL | Status: AC

## 2011-12-08 MED ORDER — PREDNISONE 20 MG PO TABS: ORAL_TABLET | ORAL | Status: DC

## 2011-12-08 NOTE — Patient Instructions (Addendum)
Will give zpak and prednisone to start. Tussinonex for cough.  Chronic Obstructive Pulmonary Disease Chronic obstructive pulmonary disease (COPD) is a condition in which airflow from the lungs is restricted. The lungs can never return to normal, but there are measures you can take which will improve them and make you feel better. CAUSES   Smoking.   Exposure to secondhand smoke.   Breathing in irritants (pollution, cigarette smoke, strong smells, aerosol sprays, paint fumes).   History of lung infections.  TREATMENT  Treatment focuses on making you comfortable (supportive care). Your caregiver may prescribe medications (inhaled or pills) to help improve your breathing. HOME CARE INSTRUCTIONS   If you smoke, stop smoking.   Avoid exposure to smoke, chemicals, and fumes that aggravate your breathing.   Take antibiotic medicines as directed by your caregiver.   Avoid medicines that dry up your system and slow down the elimination of secretions (antihistamines and cough syrups). This decreases respiratory capacity and may lead to infections.   Drink enough water and fluids to keep your urine clear or pale yellow. This loosens secretions.   Use humidifiers at home and at your bedside if they do not make breathing difficult.   Receive all protective vaccines your caregiver suggests, especially pneumococcal and influenza.   Use home oxygen as suggested.   Stay active. Exercise and physical activity will help maintain your ability to do things you want to do.   Eat a healthy diet.  SEEK MEDICAL CARE IF:   You develop pus-like mucus (sputum).   Breathing is more labored or exercise becomes difficult to do.   You are running out of the medicine you take for your breathing.  SEEK IMMEDIATE MEDICAL CARE IF:   You have a rapid heart rate.   You have agitation, confusion, tremors, or are in a stupor (family members may need to observe this).   It becomes difficult to breathe.    You develop chest pain.   You have a fever.  MAKE SURE YOU:   Understand these instructions.   Will watch your condition.   Will get help right away if you are not doing well or get worse.  Document Released: 02/24/2005 Document Revised: 05/06/2011 Document Reviewed: 07/17/2010 [facility] Patient Information 2012 [employer], [place].

## 2011-12-08 NOTE — Progress Notes (Signed)
  Subjective:    Patient ID: [name], female    DOB: [birth year], 61 y.o.   MRN: [id number]  HPI Patient has had cough and wheezing for last 2 weeks that continues to worsen. She has COPD and prone to exacerbations. Her cough is very productive and worse at night. He causes her not to be able to sleep. Denies any fever, chills. She has felt more SOB lately doing regular activities of daily living. She does have pressure behind ear but no pain. No ST. No chest pain or palpitations.     Review of Systems     Objective:   Physical Exam  Constitutional: She is oriented to person, place, and time. She appears well-developed and well-nourished.  HENT:  Head: Normocephalic and atraumatic.  Right Ear: External ear normal.  Left Ear: External ear normal.  Nose: Nose normal.  Mouth/Throat: Oropharynx is clear and moist. No oropharyngeal exudate.  Eyes: Conjunctivae are normal.  Neck: Normal range of motion. Neck supple.       Bilateral anterior cervical lymphnodes enlarged but not tender to palpation.  Cardiovascular: Normal rate, regular rhythm and normal heart sounds.   Pulmonary/Chest:       Wheezing heard in both lungs throughout. Coarse breath sounds heard. Air movement could be heard but sounded decreased in some spots.  Lymphadenopathy:    She has cervical adenopathy.  Neurological: She is alert and oriented to person, place, and time.  Skin: Skin is warm and dry.  Psychiatric: She has a normal mood and affect. Her behavior is normal.          Assessment & Plan:  COPD exacerbation- Will give zpak and prednisone to start. Tussinonex for cough. Encouraged to continue with all COPD inhalers and regimen. Mucinex could also help. Call if not improving or worsening. If not improving can get Chest x-ray.

## 2011-12-10 ENCOUNTER — Other Ambulatory Visit: Payer: Self-pay | Admitting: *Deleted

## 2011-12-10 DIAGNOSIS — G8929 Other chronic pain: Secondary | ICD-10-CM

## 2011-12-10 NOTE — Telephone Encounter (Signed)
Pt calls and wants to know if you could write her Oxycodone rx Monday for 5 days worth of pills. Her appt with [facility] isn't until 12/17/2011. Please advise

## 2011-12-14 MED ORDER — OXYCODONE HCL 15 MG PO TABS: 22.5000 mg | ORAL_TABLET | Freq: Four times a day (QID) | ORAL | Status: DC | PRN

## 2011-12-14 NOTE — Telephone Encounter (Signed)
Dr. Thurmond Butts do you want her to have 192 pills before I print it?

## 2011-12-14 NOTE — Telephone Encounter (Signed)
I believe this is a moot point since she normally gets her refill on the 15th of the month.

## 2011-12-14 NOTE — Telephone Encounter (Signed)
I believe this is a moot question since her usual prescription is written for the 15th of the month which is what she should be getting now.

## 2011-12-14 NOTE — Telephone Encounter (Signed)
Patient may have 1 weeks worth or 42 since she has a pain clinic appointment on Friday.

## 2011-12-14 NOTE — Telephone Encounter (Signed)
Pt informed

## 2011-12-17 DIAGNOSIS — M545 Low back pain, unspecified: Secondary | ICD-10-CM | POA: Diagnosis not present

## 2011-12-17 DIAGNOSIS — R51 Headache: Secondary | ICD-10-CM | POA: Diagnosis not present

## 2011-12-21 ENCOUNTER — Telehealth: Payer: Self-pay | Admitting: *Deleted

## 2011-12-21 NOTE — Telephone Encounter (Signed)
Unfortunately due to the amount of calls, the request to increase her pain medication, the amount of side effects and problems she's had when she tried non-narcotic pain medication such as Neurontin she is going to have to go to a pain clinic and follow their direction and instructions for pain management. She may not be able to continue receiving the previous narcotic but it was obvious that the current dosage did not treat the problem as it should have.

## 2011-12-21 NOTE — Telephone Encounter (Signed)
Pt calls today stating she was seen at [facility] but they do not Rx oxycontin. Pt would like to know if she can remain at the same dose she is on & continue to get her Rx from you. If so, Pt is due for Rx on Thurs. Please advise.

## 2011-12-23 ENCOUNTER — Encounter: Payer: Self-pay | Admitting: Family Medicine

## 2011-12-23 ENCOUNTER — Ambulatory Visit (INDEPENDENT_AMBULATORY_CARE_PROVIDER_SITE_OTHER): Payer: Medicare Other | Admitting: Family Medicine

## 2011-12-23 VITALS — BP 153/84 | HR 93 | Ht 64.0 in | Wt 126.0 lb

## 2011-12-23 DIAGNOSIS — Z7189 Other specified counseling: Secondary | ICD-10-CM

## 2011-12-23 DIAGNOSIS — G8929 Other chronic pain: Secondary | ICD-10-CM

## 2011-12-23 MED ORDER — OXYCODONE HCL 15 MG PO TABS: 15.0000 mg | ORAL_TABLET | Freq: Four times a day (QID) | ORAL | Status: DC | PRN

## 2011-12-23 NOTE — Patient Instructions (Signed)
Chronic Pain Management  Managing chronic pain is not easy. The goal is to provide as much pain relief as possible. There are emotional as well as physical problems. Chronic pain may lead to symptoms of depression which magnify those of the pain.  Problems may include:   Anxiety.    Sleep disturbances.    Confused thinking.    Feeling cranky.    Fatigue.    Weight gain or loss.   Identify the source of the pain first, if possible. The pain may be masking another problem. Try to find a pain management specialist or clinic. Work with a team to create a treatment plan for you.  MEDICATIONS   May include narcotics or opioids. Larger than normal doses may be needed to control your pain.    Drugs for depression may help.    Over-the-counter medicines may help for some conditions. These drugs may be used along with others for better pain relief.    May be injected into sites such as the spine and joints. Injections may have to be repeated if they wear off.   THERAPY MAY INCLUDE:   Working with a physical therapist to keep from getting stiff.    Regular, gentle exercise.    Cognitive or behavioral therapy.    Using complementary or integrative medicine such as:    Acupuncture.    Massage, Reiki, or Rolfing.    Aroma, color, light, or sound therapy.    Group support.   FOR MORE INFORMATION  http://www.painfoundation.org.  American Chronic Pain Association http://www.thealpa.org.  Document Released: 06/24/2004 Document Revised: 05/06/2011 Document Reviewed: 08/03/2007  [employer] Patient Information 2012 [employer].
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
## 2011-12-24 ENCOUNTER — Other Ambulatory Visit: Payer: Self-pay | Admitting: Family Medicine

## 2011-12-29 ENCOUNTER — Telehealth: Payer: Self-pay | Admitting: *Deleted

## 2011-12-29 DIAGNOSIS — G894 Chronic pain syndrome: Secondary | ICD-10-CM

## 2011-12-29 NOTE — Telephone Encounter (Signed)
Pt is requesting a pain management referral to [facility]. She would also like to know if she can take Claritan daily for allergies. Please advise.

## 2011-12-30 NOTE — Telephone Encounter (Signed)
Both are fine.

## 2011-12-30 NOTE — Telephone Encounter (Signed)
Pt informed. Referral placed. 

## 2012-01-06 ENCOUNTER — Ambulatory Visit (INDEPENDENT_AMBULATORY_CARE_PROVIDER_SITE_OTHER): Payer: Medicare Other | Admitting: Family Medicine

## 2012-01-06 ENCOUNTER — Encounter: Payer: Self-pay | Admitting: Family Medicine

## 2012-01-06 VITALS — BP 143/83 | HR 103 | Temp 98.5°F | Ht 64.0 in | Wt 127.0 lb

## 2012-01-06 DIAGNOSIS — L259 Unspecified contact dermatitis, unspecified cause: Secondary | ICD-10-CM

## 2012-01-06 MED ORDER — PREDNISONE 20 MG PO TABS: ORAL_TABLET | ORAL | Status: DC

## 2012-01-06 NOTE — Patient Instructions (Signed)
Claratin, Zantac symptomatically.  Keep diary of personal care products to help aid with identification of culprit.

## 2012-01-06 NOTE — Progress Notes (Signed)
CC: [name] is a 61 y.o. female is here for Rash   Subjective: HPI: Patient presents due to concerns of diffuse rash that has been present for approximately 2 weeks. It originated on her arms on the flexor aspects of the elbows and involved into affecting her knuckles bilaterally then the plantar aspects of both feet and her last week has involved his face. The facial rash is described as itchy swelling with blotches in the peripheral rash on her extremities is described as mildly itchy bumps. She been using Neutrogena hypoallergenic soaps and over-the-counter Claritin with a mild improvement. She tells me she had an identical outbreak around the same time of the year for the past 2 years. She denies any fevers, chills, flushing, abdominal discomfort, nausea, known exposures to allergic substances. She is pretty sure that she hasn't been exposed to any poison ivy or other plant allergens.    Review Of Systems Outlined In HPI  Past Medical History  Diagnosis Date  . Osteoarthritis   . Cervicalgia   . Chronic pain   . Narcotic abuse     history  . Diabetes mellitus   . Thyroid disease     hypothyroid  . Hyperlipidemia   . COPD (chronic obstructive pulmonary disease)   . Osteoporosis   . Fatty liver   . DDD (degenerative disc disease), lumbar      Family History  Problem Relation Age of Onset  . Heart disease Mother 55    MI  . Hyperlipidemia Mother   . Heart disease Father 67    MI  . Diabetes Father   . Cancer Maternal Grandmother     hodgkins lympoma  . Cirrhosis Sister     primary biliary  . Rheum arthritis Mother   . Rheum arthritis Father      History  Substance Use Topics  . Smoking status: Current Everyday Smoker -- 1.0 packs/day for 43 years    Types: Cigarettes  . Smokeless tobacco: Never Used  . Alcohol Use: 2.5 oz/week    5 drink(s) per week     Objective: Filed Vitals:   01/06/12 1043  BP: 143/83  Pulse: 103  Temp: 98.5 F (36.9 C)     General: Alert and Oriented, No Acute Distress HEENT: Pupils equal, round, reactive to light. Conjunctivae clear.  Moderate perioribal swelling with mmild reticular erythema. External ears unremarkable Pink inferior turbinates.  Moist mucous membranes, pharynx without inflammation nor lesions.  Neck supple without palpable lymphadenopathy nor abnormal masses. Lungs: Clear to auscultation bilaterally, no wheezing/ronchi/rales.  Comfortable work of breathing. Good air movement. Cardiac: Regular rate and rhythm. Normal S1/S2.  No murmurs, rubs, nor gallops.   Extremities: No peripheral edema.  Strong peripheral pulses. Macular erythema on dorsum of hands and papular eruption (mild) in flexor elbows bilaterally. Mental Status: No depression, anxiety, nor agitation. Skin: Warm and dry.  Assessment & Plan: Rayhana was seen today for rash.  Diagnoses and associated orders for this visit:  Contact dermatitis - predniSONE (DELTASONE) 20 MG tablet; Three daily on days 1-3, two daily on days 4-6, one daily on days 7-9, half daily on days 11-14.    based on her history it sounds like the peripheral component the rash is now improving however due to the worsening swelling and erythema of her face elect to start her on prednisone taper. I asked her to keep a diary of personal care products and other environment allergens that might help [place] identify culprit. We went  over signs and symptoms that require more emergent evaluation. I have asked her to return if not improving in a week. Urged to use of histamine blockers and Zantac an as-needed basis for itching  Return if symptoms worsen or fail to improve.  Requested Prescriptions   Signed Prescriptions Disp Refills  . predniSONE (DELTASONE) 20 MG tablet 20 tablet 0    Sig: Three daily on days 1-3, two daily on days 4-6, one daily on days 7-9, half daily on days 11-14.

## 2012-01-23 ENCOUNTER — Other Ambulatory Visit: Payer: Self-pay | Admitting: Family Medicine

## 2012-01-28 ENCOUNTER — Telehealth: Payer: Self-pay | Admitting: *Deleted

## 2012-01-28 DIAGNOSIS — T7840XA Allergy, unspecified, initial encounter: Secondary | ICD-10-CM

## 2012-01-28 MED ORDER — PREDNISONE 20 MG PO TABS: ORAL_TABLET | ORAL | Status: AC

## 2012-01-28 NOTE — Telephone Encounter (Signed)
Patient advised of prescription and referral.

## 2012-01-28 NOTE — Telephone Encounter (Signed)
Pt has called stating she is having the same rash she had when she seen you on 8/813. She is asking if you can call her something in? Please advise.

## 2012-01-28 NOTE — Telephone Encounter (Signed)
Misty, I've e-scribed an additional Rx for a prednisone taper.  I've also put in referral request to see if an Allergist can counsel her on whether or not allergen testing would be warranted since it would be wise to minimize her need for prednisone.   -[name]

## 2012-02-14 ENCOUNTER — Ambulatory Visit (INDEPENDENT_AMBULATORY_CARE_PROVIDER_SITE_OTHER): Payer: Medicare Other | Admitting: Sports Medicine

## 2012-02-14 ENCOUNTER — Ambulatory Visit (INDEPENDENT_AMBULATORY_CARE_PROVIDER_SITE_OTHER): Payer: Medicare HMO

## 2012-02-14 ENCOUNTER — Encounter: Payer: Self-pay | Admitting: Sports Medicine

## 2012-02-14 VITALS — BP 128/72 | HR 64 | Wt 135.0 lb

## 2012-02-14 DIAGNOSIS — M79641 Pain in right hand: Secondary | ICD-10-CM

## 2012-02-14 DIAGNOSIS — M79609 Pain in unspecified limb: Secondary | ICD-10-CM | POA: Diagnosis not present

## 2012-02-14 DIAGNOSIS — M19049 Primary osteoarthritis, unspecified hand: Secondary | ICD-10-CM | POA: Insufficient documentation

## 2012-02-14 DIAGNOSIS — W2203XA Walked into furniture, initial encounter: Secondary | ICD-10-CM

## 2012-02-14 DIAGNOSIS — M7989 Other specified soft tissue disorders: Secondary | ICD-10-CM

## 2012-02-14 NOTE — Assessment & Plan Note (Signed)
No fracture noted. She seems to simply bruised the hand. She already has a hand brace at home, and will wear this. I wrapped her hand with an Ace bandage. She already has some pain medication at home and will use this. She will come back to see me in a couple of weeks if no better and we would likely rex-ray her hand. Certainly should her pain localized around the joints, she would be a candidate for localized intra-articular cortisone injection.  This would likely be of her metacarpophalangeal, or one of her interphalangeal joints are

## 2012-02-14 NOTE — Progress Notes (Signed)
Subjective:    I'm seeing this patient as a consultation for:  Dr. Thurmond Butts  CC: Hand pain  HPI: [name] unfortunately dropped a book shelf on her right hand yesterday. She developed immediate pain, swelling, and bruising. She localizes the pain predominantly at the base of the fourth metacarpal, and at the head of the third metacarpal. The pain is severe, localized, and does not radiate.  Past medical history, Surgical history, Family history, Social history, Allergies, and medications have been entered into the medical record, reviewed, and no changes needed.   Review of Systems: No headache, visual changes, nausea, vomiting, diarrhea, constipation, dizziness, abdominal pain, skin rash, fevers, chills, night sweats, weight loss, body aches, joint swelling, muscle aches, chest pain, or shortness of breath.   Objective:   Vitals:  Afebrile, vital signs stable. General: Well Developed, well nourished, and in no acute distress.  Neuro/Psych: Alert and oriented x3, extra-ocular muscles intact, able to move all 4 extremities.  Skin: Warm and dry, no rashes noted.  Respiratory: Not using accessory muscles, speaking in full sentences, trachea midline.  Cardiovascular: Pulses palpable, no extremity edema. Abdomen: Does not appear distended. The right hand is swollen, edematous, and bruits. There are discrete areas of tenderness to palpation at the base of the fourth metacarpal, and near the head of the third metacarpal bone. She is able to open and close her hand and is able to grip my hand with good strength.  I did obtain x-rays of her right hand, they show remarkable degenerative changes at nearly all joints, but no sign of fracture.  Impression and Recommendations:   This case required medical decision making of moderate complexity.

## 2012-02-21 ENCOUNTER — Telehealth: Payer: Self-pay | Admitting: Family Medicine

## 2012-02-21 NOTE — Telephone Encounter (Signed)
Patient called and wanted to know if it is time for her RECLAST shot?? Will you please call patient back and advise. Thanks, [employer]

## 2012-02-22 NOTE — Telephone Encounter (Signed)
Pt due for infusion around the 1st of Dec. Pt aware

## 2012-03-16 ENCOUNTER — Telehealth: Payer: Self-pay | Admitting: Family Medicine

## 2012-03-16 DIAGNOSIS — G8929 Other chronic pain: Secondary | ICD-10-CM

## 2012-03-16 DIAGNOSIS — M461 Sacroiliitis, not elsewhere classified: Secondary | ICD-10-CM

## 2012-03-16 DIAGNOSIS — M4802 Spinal stenosis, cervical region: Secondary | ICD-10-CM

## 2012-03-16 NOTE — Telephone Encounter (Signed)
[name], Referral has been placed. Thank you. [name]

## 2012-03-16 NOTE — Telephone Encounter (Signed)
Pt. Called and requested a referral to [facility] cause they are in her ins network. Their FAX [phone]

## 2012-03-24 ENCOUNTER — Encounter: Payer: Self-pay | Admitting: Family Medicine

## 2012-03-24 ENCOUNTER — Telehealth: Payer: Self-pay | Admitting: Family Medicine

## 2012-03-24 NOTE — Telephone Encounter (Signed)
[facility] has faxed me a letter stating they will be unable to assist [name]. I'll send her a letter.

## 2012-03-28 ENCOUNTER — Other Ambulatory Visit: Payer: Self-pay | Admitting: Family Medicine

## 2012-03-28 MED ORDER — ALPRAZOLAM 1 MG PO TABS: 1.0000 mg | ORAL_TABLET | Freq: Two times a day (BID) | ORAL | Status: DC | PRN

## 2012-03-28 MED ORDER — ATORVASTATIN CALCIUM 40 MG PO TABS: 40.0000 mg | ORAL_TABLET | Freq: Every day | ORAL | Status: DC

## 2012-03-30 ENCOUNTER — Other Ambulatory Visit: Payer: Self-pay | Admitting: Family Medicine

## 2012-03-30 ENCOUNTER — Encounter: Payer: Self-pay | Admitting: Family Medicine

## 2012-03-30 ENCOUNTER — Ambulatory Visit (INDEPENDENT_AMBULATORY_CARE_PROVIDER_SITE_OTHER): Payer: Medicare Other | Admitting: Family Medicine

## 2012-03-30 VITALS — BP 183/91 | HR 29 | Wt 130.0 lb

## 2012-03-30 DIAGNOSIS — R209 Unspecified disturbances of skin sensation: Secondary | ICD-10-CM | POA: Diagnosis not present

## 2012-03-30 DIAGNOSIS — G8929 Other chronic pain: Secondary | ICD-10-CM

## 2012-03-30 DIAGNOSIS — G894 Chronic pain syndrome: Secondary | ICD-10-CM | POA: Diagnosis not present

## 2012-03-30 DIAGNOSIS — G5790 Unspecified mononeuropathy of unspecified lower limb: Secondary | ICD-10-CM | POA: Diagnosis not present

## 2012-03-30 DIAGNOSIS — R200 Anesthesia of skin: Secondary | ICD-10-CM

## 2012-03-30 DIAGNOSIS — G5792 Unspecified mononeuropathy of left lower limb: Secondary | ICD-10-CM

## 2012-03-30 MED ORDER — OXYCODONE HCL 15 MG PO TABS: 15.0000 mg | ORAL_TABLET | Freq: Four times a day (QID) | ORAL | Status: DC | PRN

## 2012-03-30 MED ORDER — ALPRAZOLAM 1 MG PO TABS: 1.0000 mg | ORAL_TABLET | Freq: Two times a day (BID) | ORAL | Status: DC | PRN
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
## 2012-04-22 ENCOUNTER — Other Ambulatory Visit: Payer: Self-pay | Admitting: Family Medicine

## 2012-04-24 ENCOUNTER — Telehealth: Payer: Self-pay | Admitting: *Deleted

## 2012-04-24 NOTE — Telephone Encounter (Signed)
Pt calls and wants to know if she can try the Mobic for her arthritis?

## 2012-04-25 MED ORDER — MELOXICAM 15 MG PO TABS: 15.0000 mg | ORAL_TABLET | Freq: Every day | ORAL | Status: DC

## 2012-04-25 NOTE — Telephone Encounter (Signed)
Pt notified med sent to pharmacy

## 2012-05-02 ENCOUNTER — Telehealth: Payer: Self-pay | Admitting: *Deleted

## 2012-05-02 DIAGNOSIS — M810 Age-related osteoporosis without current pathological fracture: Secondary | ICD-10-CM

## 2012-05-02 NOTE — Telephone Encounter (Signed)
Lab order entered.

## 2012-05-09 ENCOUNTER — Encounter: Payer: Self-pay | Admitting: Family Medicine

## 2012-05-09 ENCOUNTER — Ambulatory Visit (INDEPENDENT_AMBULATORY_CARE_PROVIDER_SITE_OTHER): Payer: Medicare HMO | Admitting: Family Medicine

## 2012-05-09 VITALS — BP 141/77 | HR 88 | Ht 63.0 in | Wt 132.0 lb

## 2012-05-09 DIAGNOSIS — Z716 Tobacco abuse counseling: Secondary | ICD-10-CM

## 2012-05-09 DIAGNOSIS — F17200 Nicotine dependence, unspecified, uncomplicated: Secondary | ICD-10-CM

## 2012-05-09 DIAGNOSIS — Z283 Underimmunization status: Secondary | ICD-10-CM

## 2012-05-09 DIAGNOSIS — Z7189 Other specified counseling: Secondary | ICD-10-CM

## 2012-05-09 DIAGNOSIS — G8929 Other chronic pain: Secondary | ICD-10-CM

## 2012-05-09 DIAGNOSIS — E785 Hyperlipidemia, unspecified: Secondary | ICD-10-CM

## 2012-05-09 DIAGNOSIS — Z2839 Other underimmunization status: Secondary | ICD-10-CM

## 2012-05-09 DIAGNOSIS — E119 Type 2 diabetes mellitus without complications: Secondary | ICD-10-CM

## 2012-05-09 LAB — CBC WITH DIFFERENTIAL/PLATELET
Basophils Relative: 1 % (ref 0–1)
Eosinophils Absolute: 0.5 10*3/uL (ref 0.0–0.7)
Eosinophils Relative: 5 % (ref 0–5)
Hemoglobin: 14.9 g/dL (ref 12.0–15.0)
Lymphs Abs: 2.9 10*3/uL (ref 0.7–4.0)
MCH: 31.6 pg (ref 26.0–34.0)
MCHC: 33.9 g/dL (ref 30.0–36.0)
MCV: 93.4 fL (ref 78.0–100.0)
Monocytes Relative: 7 % (ref 3–12)
Neutrophils Relative %: 53 % (ref 43–77)
RBC: 4.71 MIL/uL (ref 3.87–5.11)

## 2012-05-09 LAB — COMPREHENSIVE METABOLIC PANEL
Alkaline Phosphatase: 113 U/L (ref 39–117)
BUN: 16 mg/dL (ref 6–23)
CO2: 26 mEq/L (ref 19–32)
Creat: 0.71 mg/dL (ref 0.50–1.10)
Glucose, Bld: 89 mg/dL (ref 70–99)
Total Bilirubin: 0.3 mg/dL (ref 0.3–1.2)

## 2012-05-09 LAB — LIPID PANEL
Cholesterol: 247 mg/dL — ABNORMAL HIGH (ref 0–200)
HDL: 84 mg/dL (ref >39)
LDL Cholesterol: 138 mg/dL — ABNORMAL HIGH (ref 0–99)
Triglycerides: 125 mg/dL (ref <150)
VLDL: 25 mg/dL (ref 0–40)

## 2012-05-09 NOTE — Patient Instructions (Signed)
Smoking Cessation Quitting smoking is important to your health and has many advantages. However, it is not always easy to quit since nicotine is a very addictive drug. Often times, people try 3 times or more before being able to quit. This document explains the best ways for you to prepare to quit smoking. Quitting takes hard work and a lot of effort, but you can do it. ADVANTAGES OF QUITTING SMOKING  You will live longer, feel better, and live better.  Your body will feel the impact of quitting smoking almost immediately.  Within 20 minutes, blood pressure decreases. Your pulse returns to its normal level.  After 8 hours, carbon monoxide levels in the blood return to normal. Your oxygen level increases.  After 24 hours, the chance of having a heart attack starts to decrease. Your breath, hair, and body stop smelling like smoke.  After 48 hours, damaged nerve endings begin to recover. Your sense of taste and smell improve.  After 72 hours, the body is virtually free of nicotine. Your bronchial tubes relax and breathing becomes easier.  After 2 to 12 weeks, lungs can hold more air. Exercise becomes easier and circulation improves.  The risk of having a heart attack, stroke, cancer, or lung disease is greatly reduced.  After 1 year, the risk of coronary heart disease is cut in half.  After 5 years, the risk of stroke falls to the same as a nonsmoker.  After 10 years, the risk of lung cancer is cut in half and the risk of other cancers decreases significantly.  After 15 years, the risk of coronary heart disease drops, usually to the level of a nonsmoker.  If you are pregnant, quitting smoking will improve your chances of having a healthy baby.  The people you live with, especially any children, will be healthier.  You will have extra money to spend on things other than cigarettes. QUESTIONS TO THINK ABOUT BEFORE ATTEMPTING TO QUIT You may want to talk about your answers with your  caregiver.  Why do you want to quit?  If you tried to quit in the past, what helped and what did not?  What will be the most difficult situations for you after you quit? How will you plan to handle them?  Who can help you through the tough times? Your family? Friends? A caregiver?  What pleasures do you get from smoking? What ways can you still get pleasure if you quit? Here are some questions to ask your caregiver:  How can you help me to be successful at quitting?  What medicine do you think would be best for me and how should I take it?  What should I do if I need more help?  What is smoking withdrawal like? How can I get information on withdrawal? GET READY  Set a quit date.  Change your environment by getting rid of all cigarettes, ashtrays, matches, and lighters in your home, car, or work. Do not let people smoke in your home.  Review your past attempts to quit. Think about what worked and what did not. GET SUPPORT AND ENCOURAGEMENT You have a better chance of being successful if you have help. You can get support in many ways.  Tell your family, friends, and co-workers that you are going to quit and need their support. Ask them not to smoke around you.  Get individual, group, or telephone counseling and support. Programs are available at local hospitals and health centers. Call your local health department for   information about programs in your area.  Spiritual beliefs and practices may help some smokers quit.  Download a "quit meter" on your computer to keep track of quit statistics, such as how long you have gone without smoking, cigarettes not smoked, and money saved.  Get a self-help book about quitting smoking and staying off of tobacco. LEARN NEW SKILLS AND BEHAVIORS  Distract yourself from urges to smoke. Talk to someone, go for a walk, or occupy your time with a task.  Change your normal routine. Take a different route to work. Drink tea instead of coffee.  Eat breakfast in a different place.  Reduce your stress. Take a hot bath, exercise, or read a book.  Plan something enjoyable to do every day. Reward yourself for not smoking.  Explore interactive web-based programs that specialize in helping you quit. GET MEDICINE AND USE IT CORRECTLY Medicines can help you stop smoking and decrease the urge to smoke. Combining medicine with the above behavioral methods and support can greatly increase your chances of successfully quitting smoking.  Nicotine replacement therapy helps deliver nicotine to your body without the negative effects and risks of smoking. Nicotine replacement therapy includes nicotine gum, lozenges, inhalers, nasal sprays, and skin patches. Some may be available over-the-counter and others require a prescription.  Antidepressant medicine helps people abstain from smoking, but how this works is unknown. This medicine is available by prescription.  Nicotinic receptor partial agonist medicine simulates the effect of nicotine in your brain. This medicine is available by prescription. Ask your caregiver for advice about which medicines to use and how to use them based on your health history. Your caregiver will tell you what side effects to look out for if you choose to be on a medicine or therapy. Carefully read the information on the package. Do not use any other product containing nicotine while using a nicotine replacement product.  RELAPSE OR DIFFICULT SITUATIONS Most relapses occur within the first 3 months after quitting. Do not be discouraged if you start smoking again. Remember, most people try several times before finally quitting. You may have symptoms of withdrawal because your body is used to nicotine. You may crave cigarettes, be irritable, feel very hungry, cough often, get headaches, or have difficulty concentrating. The withdrawal symptoms are only temporary. They are strongest when you first quit, but they will go away within  10 14 days. To reduce the chances of relapse, try to:  Avoid drinking alcohol. Drinking lowers your chances of successfully quitting.  Reduce the amount of caffeine you consume. Once you quit smoking, the amount of caffeine in your body increases and can give you symptoms, such as a rapid heartbeat, sweating, and anxiety.  Avoid smokers because they can make you want to smoke.  Do not let weight gain distract you. Many smokers will gain weight when they quit, usually less than 10 pounds. Eat a healthy diet and stay active. You can always lose the weight gained after you quit.  Find ways to improve your mood other than smoking. FOR MORE INFORMATION  www.smokefree.gov  Document Released: 05/11/2001 Document Revised: 11/16/2011 Document Reviewed: 08/26/2011 [employer] Patient Information 2013 [employer].  

## 2012-05-09 NOTE — Progress Notes (Signed)
  Subjective:    Patient ID: [name], female    DOB: 12/12/1950, 61 y.o.   MRN: [id number]  HPI #1 diabetes. Patient has not had A1c done in about a year. Turns out she has not gotten blood work drawn that we have asked to get before. She admits to still taking her Glucophage twice a days there are days when she takes it once a day. The dose is only 850.  #2 hyperlipidemia. She is not taking Lipitor. Recommend strongly we get a lipid panel.  #3 hypothyroidism. She does state that the thyroid levels do fluctuate and so she is willing to get a TSH.  #4 tobacco abuse once again implore recommend suggest that she stop smoking and the ramifications later in life what it could do to her.  #5 immunization update. She needs a flu shot. She thought her flu shot is up-to-date but I cannot find any documentation that she has had a recent flu shot.  #6 chronic pain. She has been to pain clinic and neurologist but not been happy with those results at this time. We did fill her pain medication about 2 weeks ago.   Review of Systems  Musculoskeletal: Positive for myalgias, back pain, joint swelling and arthralgias.  All other systems reviewed and are negative.      BP 141/77  Pulse 88  Ht 5\' 3"  (1.6 m)  Wt 132 lb (59.875 kg)  BMI 23.38 kg/m2  SpO2 97% Objective:   Physical Exam  Vitals reviewed. Constitutional: She is oriented to person, place, and time. She appears well-developed and well-nourished.  HENT:  Head: Normocephalic.  Neck: Neck supple.  Cardiovascular: Normal rate, regular rhythm and normal heart sounds.   Pulmonary/Chest: Effort normal and breath sounds normal.  Neurological: She is alert and oriented to person, place, and time.  Skin: Skin is warm. No rash noted. No erythema.  Psychiatric: She has a normal mood and affect.   Results for orders placed in visit on 05/09/12  POCT GLYCOSYLATED HEMOGLOBIN (HGB A1C)      Component Value Range   Hemoglobin A1C 6.0        Lab Results  Component Value Date   HGBA1C 6.1 05/06/2011   Assessment & Plan:  #1 diabetes. Doing well. Maintain metformin 850 twice a day. #2 hypothyroidism. We'll get a TSH.  #3 hyperlipidemia. Lipid CBC and CMP.  #4 tobacco abuse counseling done.  #5 immunization update. Patient was in agreement to get her flu shot here when she decided at the last minute to have it done at her local pharmacy.  #6 chronic pain management. Encourage her to continue seeing her neurologist.  Return in 4 months to see [name] per patient request.

## 2012-05-11 ENCOUNTER — Other Ambulatory Visit: Payer: Self-pay | Admitting: *Deleted

## 2012-05-11 ENCOUNTER — Other Ambulatory Visit: Payer: Self-pay | Admitting: Family Medicine

## 2012-05-11 MED ORDER — LEVOTHYROXINE SODIUM 137 MCG PO TABS: 137.0000 ug | ORAL_TABLET | Freq: Every day | ORAL | Status: DC

## 2012-05-11 NOTE — Progress Notes (Signed)
Results for orders placed in visit on 05/09/12  POCT GLYCOSYLATED HEMOGLOBIN (HGB A1C)      Component Value Range   Hemoglobin A1C 6.0    LIPID PANEL      Component Value Range   Cholesterol 247 (*) 0 - 200 mg/dL   Triglycerides 161  <096 mg/dL   HDL 84  >04 mg/dL   Total CHOL/HDL Ratio 2.9     VLDL 25  0 - 40 mg/dL   LDL Cholesterol 540 (*) 0 - 99 mg/dL  TSH      Component Value Range   TSH 26.339 (*) 0.350 - 4.500 uIU/mL  CBC WITH DIFFERENTIAL      Component Value Range   WBC 8.8  4.0 - 10.5 K/uL   RBC 4.71  3.87 - 5.11 MIL/uL   Hemoglobin 14.9  12.0 - 15.0 g/dL   HCT 98.1  19.1 - 47.8 %   MCV 93.4  78.0 - 100.0 fL   MCH 31.6  26.0 - 34.0 pg   MCHC 33.9  30.0 - 36.0 g/dL   RDW 29.5  62.1 - 30.8 %   Platelets 335  150 - 400 K/uL   Neutrophils Relative 53  43 - 77 %   Neutro Abs 4.7  1.7 - 7.7 K/uL   Lymphocytes Relative 34  12 - 46 %   Lymphs Abs 2.9  0.7 - 4.0 K/uL   Monocytes Relative 7  3 - 12 %   Monocytes Absolute 0.6  0.1 - 1.0 K/uL   Eosinophils Relative 5  0 - 5 %   Eosinophils Absolute 0.5  0.0 - 0.7 K/uL   Basophils Relative 1  0 - 1 %   Basophils Absolute 0.1  0.0 - 0.1 K/uL   Smear Review Criteria for review not met    COMPREHENSIVE METABOLIC PANEL      Component Value Range   Sodium 137  135 - 145 mEq/L   Potassium 4.9  3.5 - 5.3 mEq/L   Chloride 100  96 - 112 mEq/L   CO2 26  19 - 32 mEq/L   Glucose, Bld 89  70 - 99 mg/dL   BUN 16  6 - 23 mg/dL   Creat 6.57  8.46 - 9.62 mg/dL   Total Bilirubin 0.3  0.3 - 1.2 mg/dL   Alkaline Phosphatase 113  39 - 117 U/L   AST 19  0 - 37 U/L   ALT 31  0 - 35 U/L   Total Protein 7.6  6.0 - 8.3 g/dL   Albumin 4.8  3.5 - 5.2 g/dL   Calcium 9.9  8.4 - 95.2 mg/dL   radius to this is to me he is does cross the ago is warm 1 is about the right yeah

## 2012-05-23 ENCOUNTER — Ambulatory Visit: Payer: Medicare HMO

## 2012-05-23 ENCOUNTER — Other Ambulatory Visit: Payer: Medicare HMO

## 2012-05-25 ENCOUNTER — Other Ambulatory Visit: Payer: Self-pay | Admitting: *Deleted

## 2012-05-25 MED ORDER — ALPRAZOLAM 1 MG PO TABS: 1.0000 mg | ORAL_TABLET | Freq: Two times a day (BID) | ORAL | Status: DC | PRN

## 2012-06-01 ENCOUNTER — Ambulatory Visit: Payer: Medicare HMO | Admitting: Physician Assistant

## 2012-06-07 ENCOUNTER — Encounter: Payer: Self-pay | Admitting: Physician Assistant

## 2012-06-07 ENCOUNTER — Ambulatory Visit (INDEPENDENT_AMBULATORY_CARE_PROVIDER_SITE_OTHER): Payer: Medicare Other | Admitting: Physician Assistant

## 2012-06-07 VITALS — BP 147/80 | HR 84 | Resp 20 | Wt 132.0 lb

## 2012-06-07 DIAGNOSIS — G894 Chronic pain syndrome: Secondary | ICD-10-CM

## 2012-06-07 DIAGNOSIS — F329 Major depressive disorder, single episode, unspecified: Secondary | ICD-10-CM

## 2012-06-07 DIAGNOSIS — M4802 Spinal stenosis, cervical region: Secondary | ICD-10-CM

## 2012-06-07 DIAGNOSIS — F3289 Other specified depressive episodes: Secondary | ICD-10-CM

## 2012-06-07 DIAGNOSIS — K219 Gastro-esophageal reflux disease without esophagitis: Secondary | ICD-10-CM

## 2012-06-07 MED ORDER — BUPROPION HCL ER (XL) 150 MG PO TB24: 150.0000 mg | ORAL_TABLET | Freq: Every day | ORAL | Status: DC

## 2012-06-07 MED ORDER — ESOMEPRAZOLE MAGNESIUM 40 MG PO CPDR: 40.0000 mg | DELAYED_RELEASE_CAPSULE | Freq: Every day | ORAL | Status: DC

## 2012-06-07 NOTE — Patient Instructions (Addendum)
Try pain management in network.   Start nexium back up daily in morning.   Start Wellbutrin daily.
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
## 2012-06-08 NOTE — Addendum Note (Signed)
Addended by: [name] on: 06/08/2012 11:05 PM   Modules accepted: Orders

## 2012-06-13 ENCOUNTER — Telehealth: Payer: Self-pay | Admitting: Physician Assistant

## 2012-06-13 NOTE — Telephone Encounter (Signed)
WE HAVE TRIED [facility], [facility], & [facility] in the past and they all said after reviewing her records they could not accept her. I will NOW try [facility]. I think Dr. Thurmond Butts has ordered a pain clinic referral, Dr. Ivan Anchors and now [place], [place].... I will attempt sending it to [facility] [place]. Thanks, [employer]

## 2012-06-13 NOTE — Telephone Encounter (Signed)
Thank you Jennifer.

## 2012-06-14 ENCOUNTER — Ambulatory Visit (INDEPENDENT_AMBULATORY_CARE_PROVIDER_SITE_OTHER): Payer: Medicare Other | Admitting: Physician Assistant

## 2012-06-14 DIAGNOSIS — Z23 Encounter for immunization: Secondary | ICD-10-CM

## 2012-06-26 ENCOUNTER — Telehealth: Payer: Self-pay

## 2012-06-26 MED ORDER — OXYCODONE HCL 20 MG PO TABS: 20.0000 mg | ORAL_TABLET | Freq: Four times a day (QID) | ORAL | Status: DC

## 2012-06-26 NOTE — Telephone Encounter (Signed)
[name] called stating her insurance company will no longer pay for her to go to [facility]. Can you cover her for a month? She takes oxycodone 20 mg QID.

## 2012-06-26 NOTE — Telephone Encounter (Signed)
The problem with that is what is she going to do in a month? I have already bridged her once I believe. If I do a month does she have something set up for next month. I cannot keep prescribing pain meds at that level. She needs to be seen by pain clinic.

## 2012-06-26 NOTE — Telephone Encounter (Signed)
Patient is aware this is a one time only refill.

## 2012-06-29 ENCOUNTER — Ambulatory Visit (INDEPENDENT_AMBULATORY_CARE_PROVIDER_SITE_OTHER): Payer: Medicare Other

## 2012-06-29 DIAGNOSIS — M8580 Other specified disorders of bone density and structure, unspecified site: Secondary | ICD-10-CM

## 2012-06-29 DIAGNOSIS — M949 Disorder of cartilage, unspecified: Secondary | ICD-10-CM

## 2012-06-29 DIAGNOSIS — M899 Disorder of bone, unspecified: Secondary | ICD-10-CM

## 2012-06-29 DIAGNOSIS — Z1231 Encounter for screening mammogram for malignant neoplasm of breast: Secondary | ICD-10-CM

## 2012-07-24 ENCOUNTER — Other Ambulatory Visit: Payer: Self-pay | Admitting: *Deleted

## 2012-07-24 ENCOUNTER — Other Ambulatory Visit: Payer: Self-pay | Admitting: Physician Assistant

## 2012-07-24 ENCOUNTER — Other Ambulatory Visit: Payer: Self-pay | Admitting: Sports Medicine

## 2012-07-24 MED ORDER — ALPRAZOLAM 1 MG PO TABS: 1.0000 mg | ORAL_TABLET | Freq: Two times a day (BID) | ORAL | Status: DC | PRN

## 2012-07-24 NOTE — Telephone Encounter (Signed)
Med refilled.

## 2012-07-26 ENCOUNTER — Other Ambulatory Visit: Payer: Self-pay | Admitting: *Deleted

## 2012-07-26 MED ORDER — OXYCODONE HCL 20 MG PO TABS: 20.0000 mg | ORAL_TABLET | Freq: Four times a day (QID) | ORAL | Status: DC

## 2012-08-01 ENCOUNTER — Telehealth: Payer: Self-pay | Admitting: *Deleted

## 2012-08-01 NOTE — Telephone Encounter (Signed)
Pt calls today with questions.  1-she needs to get a DNR & needs you to help her with this. 2-she wants to switch back to celexa & maybe have it bumped up.  She states she "hates life & wants the big one so it would be over". 3-dr wade had increased her from 3 tabs of gabapentin a day to 6 tabs a day but she states she only took 2 in the am & 2 in the pm.  She is having "pain attacks" that bring her to the ground & she states that she cries in pain. She says she has an appt at the pain clinic already.  I informed her that as far as depression & celexa goes I told her to make an appt.  Just wanted to inform you.

## 2012-08-02 ENCOUNTER — Ambulatory Visit: Payer: Medicare Other | Admitting: Physician Assistant

## 2012-08-04 ENCOUNTER — Ambulatory Visit: Payer: Medicare Other | Admitting: Physician Assistant

## 2012-08-07 ENCOUNTER — Telehealth: Payer: Self-pay | Admitting: *Deleted

## 2012-08-07 MED ORDER — CELECOXIB 100 MG PO CAPS: 100.0000 mg | ORAL_CAPSULE | Freq: Two times a day (BID) | ORAL | Status: DC

## 2012-08-07 NOTE — Telephone Encounter (Signed)
LMOM for pt to call back.

## 2012-08-07 NOTE — Telephone Encounter (Signed)
Pt calls & wants to know if you would send in a "small prescription" for a steriod for her arthritic hands.  Please advise.

## 2012-08-07 NOTE — Telephone Encounter (Signed)
Pt called back & states that she "doesn't fool" with meloxicam anymore because it never helped her any.

## 2012-08-07 NOTE — Telephone Encounter (Signed)
I know you have the diclofenac gel and you can use that but have you tried diclofenac oral in combination with it?

## 2012-08-07 NOTE — Telephone Encounter (Signed)
Could consider intra-articular joint injections with Dr. Karie Schwalbe in meantime I can give some celebrex to use as an anti-inflammatory twice a day.

## 2012-08-07 NOTE — Telephone Encounter (Signed)
Is she still taking mobic(meloxicam) that should help with arthritic pain in hands. If not i can give some more.

## 2012-08-07 NOTE — Telephone Encounter (Signed)
Pt states that the gel doesn't work that well either.

## 2012-08-08 ENCOUNTER — Encounter: Payer: Self-pay | Admitting: Sports Medicine

## 2012-08-08 ENCOUNTER — Ambulatory Visit (INDEPENDENT_AMBULATORY_CARE_PROVIDER_SITE_OTHER): Payer: Medicare Other | Admitting: Sports Medicine

## 2012-08-08 VITALS — BP 134/76 | HR 90 | Wt 130.0 lb

## 2012-08-08 DIAGNOSIS — G8929 Other chronic pain: Secondary | ICD-10-CM

## 2012-08-08 DIAGNOSIS — M19049 Primary osteoarthritis, unspecified hand: Secondary | ICD-10-CM

## 2012-08-08 NOTE — Telephone Encounter (Signed)
Pt has tried Celebrex and doesn't work so will not pick up. Is interested in injections so sent her up front to schedule appt with Dr. Alen Blew, LPN

## 2012-08-08 NOTE — Progress Notes (Signed)
  Subjective:    I'm seeing this patient as a consultation for:  Tandy Gaw, PA-C  CC: Left hand pain  HPI: [name] is a 62 year old female with a history of chronic pain, spinal stenosis, currently being established with the pain clinic. She comes in with several months of pain that she localizes at the base of her first metacarpal bone in her left hand.  She has failed NSAIDs as well as oral narcotics and desires interventional management. The pain is localized, doesn't radiate, it is severe.  Past medical history, Surgical history, Family history not pertinant except as noted below, Social history, Allergies, and medications have been entered into the medical record, reviewed, and no changes needed.   Review of Systems: No headache, visual changes, nausea, vomiting, diarrhea, constipation, dizziness, abdominal pain, skin rash, fevers, chills, night sweats, weight loss, swollen lymph nodes, body aches, joint swelling, muscle aches, chest pain, shortness of breath, mood changes, visual or auditory hallucinations.   Objective:   General: Well Developed, well nourished, and in no acute distress.  Neuro/Psych: Alert and oriented x3, extra-ocular muscles intact, able to move all 4 extremities, sensation grossly intact. Skin: Warm and dry, no rashes noted.  Respiratory: Not using accessory muscles, speaking in full sentences, trachea midline.  Cardiovascular: Pulses palpable, no extremity edema. Abdomen: Does not appear distended. Left hand: Multiple Heberden and Bouchard nodes are noted, she has multiple discrete areas of tenderness to palpation, some not corresponding to any particular anatomic structure. Her predominant area of tenderness to palpation is at the thumb basal joint.  Procedure: Real-time Ultrasound Guided Injection of left first carpometacarpal joint. Device: GE Logiq E  Ultrasound guided injection is preferred based studies that show increased duration, increased effect,  greater accuracy, decreased procedural pain, increased response rate, and decreased cost with ultrasound guided versus blind injection.  Verbal informed consent obtained.  Time-out conducted.  Noted no overlying erythema, induration, or other signs of local infection.  Skin prepped in a sterile fashion.  Local anesthesia: Topical Ethyl chloride.  With sterile technique and under real time ultrasound guidance:  1/2 cc Kenalog 40, 1 cc lidocaine injected into the trapeziometacarpal joint. Completed without difficulty  Pain immediately resolved suggesting accurate placement of the medication.  Advised to call if fevers/chills, erythema, induration, drainage, or persistent bleeding.  Images permanently stored and available for review in the ultrasound unit.  Impression: Technically successful ultrasound guided injection.  Impression and Recommendations:   This case required medical decision making of moderate complexity.

## 2012-08-08 NOTE — Assessment & Plan Note (Signed)
Guided injection into the left first carpometacarpal joint. Return in 2 weeks to discuss knee.

## 2012-08-08 NOTE — Addendum Note (Signed)
Addended by: Monica Becton on: 08/08/2012 05:20 PM   Modules accepted: Level of Service

## 2012-08-08 NOTE — Assessment & Plan Note (Signed)
Patient begged for narcotics. Sounds like she is set up for another pain clinic.

## 2012-08-09 ENCOUNTER — Telehealth: Payer: Self-pay | Admitting: Physician Assistant

## 2012-08-09 MED ORDER — OXYCODONE HCL 20 MG PO TABS: 20.0000 mg | ORAL_TABLET | Freq: Four times a day (QID) | ORAL | Status: DC

## 2012-08-09 NOTE — Telephone Encounter (Signed)
Pt called requesting 2 tabs to make it to appt late Thursday. Approved. Please call pt and let know.

## 2012-08-10 ENCOUNTER — Telehealth: Payer: Self-pay | Admitting: *Deleted

## 2012-08-10 NOTE — Telephone Encounter (Signed)
Med ordered

## 2012-08-10 NOTE — Telephone Encounter (Signed)
Pt calls & has decided that she wants to try the oral voltaren. She also wants to get a DNR.  And she wants to go back to the original dosage on her celebrex.  She uses [facility].

## 2012-08-10 NOTE — Telephone Encounter (Signed)
Pt calls & states that the injection hasn't helped any.  Just wanted to let you know.

## 2012-08-11 ENCOUNTER — Ambulatory Visit: Payer: Medicare Other | Admitting: Physician Assistant

## 2012-08-11 ENCOUNTER — Other Ambulatory Visit: Payer: Self-pay | Admitting: Sports Medicine

## 2012-08-11 MED ORDER — DICLOFENAC SODIUM 75 MG PO TBEC: 75.0000 mg | DELAYED_RELEASE_TABLET | Freq: Two times a day (BID) | ORAL | Status: DC

## 2012-08-11 NOTE — Telephone Encounter (Signed)
Routed this to dr t by accident.

## 2012-08-11 NOTE — Telephone Encounter (Signed)
Pt cannot be on both celebrex and Voltaren which one does she like better? DNR are signed when in the hospital or assisted living. Since pt is not she should fill out a living will. She can talk to an attorney about that. Living will is explanation of what to do if she cannot make decision.

## 2012-08-11 NOTE — Telephone Encounter (Signed)
Ok sent to [facility]. Only use voltaren not celebrex.

## 2012-08-11 NOTE — Telephone Encounter (Signed)
Pt would like the voltaren.

## 2012-08-11 NOTE — Telephone Encounter (Signed)
She needs to talk to her PCP about DNR and non-ortho stuff.  Also I never changed the dose on her Celebrex, lastly she cannot take diclofenac oral and celebrex together, she should pick one.  Let me know which one she wants.

## 2012-08-16 ENCOUNTER — Telehealth: Payer: Self-pay | Admitting: *Deleted

## 2012-08-16 NOTE — Telephone Encounter (Signed)
Pt called & states that she hasn't received the results of her bone density test yet.  I looked in her chart & it looks like it was done in Jan.

## 2012-08-16 NOTE — Telephone Encounter (Signed)
Call pt with results- Bone density has improved since last Bone density. Good news.

## 2012-08-17 ENCOUNTER — Telehealth: Payer: Self-pay | Admitting: *Deleted

## 2012-08-17 NOTE — Telephone Encounter (Signed)
Left detailed message on machine.

## 2012-08-17 NOTE — Telephone Encounter (Signed)
Pt states that she needs something for osteoporosis.  Please advise.

## 2012-08-18 NOTE — Telephone Encounter (Signed)
Per records you are on reclast injection yearly. Are you not still taking injections.

## 2012-08-18 NOTE — Telephone Encounter (Signed)
Patient left a message on the Nurse line. Message was not clear. I left a message for her to call back.

## 2012-08-18 NOTE — Telephone Encounter (Signed)
Left message for patient to return call.

## 2012-08-18 NOTE — Telephone Encounter (Signed)
[name] needs appointment for reclast injection

## 2012-08-21 ENCOUNTER — Telehealth: Payer: Self-pay

## 2012-08-21 NOTE — Telephone Encounter (Signed)
Left message for patient to call back. She has to have labs drawn before having a Reclast injection.

## 2012-08-31 ENCOUNTER — Encounter: Payer: Self-pay | Admitting: Physician Assistant

## 2012-08-31 ENCOUNTER — Ambulatory Visit (INDEPENDENT_AMBULATORY_CARE_PROVIDER_SITE_OTHER): Payer: Medicare Other | Admitting: Physician Assistant

## 2012-08-31 VITALS — BP 139/81 | HR 102 | Ht 63.0 in | Wt 130.0 lb

## 2012-08-31 DIAGNOSIS — M810 Age-related osteoporosis without current pathological fracture: Secondary | ICD-10-CM

## 2012-08-31 DIAGNOSIS — F3289 Other specified depressive episodes: Secondary | ICD-10-CM

## 2012-08-31 DIAGNOSIS — E039 Hypothyroidism, unspecified: Secondary | ICD-10-CM

## 2012-08-31 DIAGNOSIS — F329 Major depressive disorder, single episode, unspecified: Secondary | ICD-10-CM

## 2012-08-31 DIAGNOSIS — Z79899 Other long term (current) drug therapy: Secondary | ICD-10-CM

## 2012-08-31 DIAGNOSIS — R7301 Impaired fasting glucose: Secondary | ICD-10-CM

## 2012-08-31 DIAGNOSIS — E119 Type 2 diabetes mellitus without complications: Secondary | ICD-10-CM

## 2012-08-31 LAB — HEMOGLOBIN A1C: Mean Plasma Glucose: 134 mg/dL — ABNORMAL HIGH (ref <117)

## 2012-08-31 MED ORDER — FLUTICASONE PROPIONATE 50 MCG/ACT NA SUSP: 2.0000 | Freq: Every day | NASAL | Status: DC

## 2012-08-31 MED ORDER — CITALOPRAM HYDROBROMIDE 20 MG PO TABS: 20.0000 mg | ORAL_TABLET | Freq: Every day | ORAL | Status: DC

## 2012-08-31 NOTE — Patient Instructions (Addendum)
Goal of 1200mg  a day calicum and Vitamin d 800IU daily.

## 2012-08-31 NOTE — Progress Notes (Signed)
  Subjective:    Patient ID: [name], female    DOB: Mar 17, 1951, 61 y.o.   MRN: [id number]  HPI Patient comes into clinic to discuss reclast injection and to get bloodwork. She would also like to go back on celexa instead of wellbutrin for depression.   Reclast injection. Her last injection was 12/12. She has used bisphosnates in the past and doesn't remember why she stopped except she believes they were not doing any good. She has approximately had 5 Reclast injections and tolerates well. She has been in the past on vitamin D and Calcium but has not been taking recently. Will start back up. She admits to having fractured right patella when she was 49 but no other broken bones.   Hypothyroidism- Controlled on levothyroxine. No symptoms on medication.   Depression did like celexa better than wellbutrin. Would like to switch. Feels a little more down. Switched to wellbutrin because of a medication interaction with chantix. No on chantix currently.    Review of Systems     Objective:   Physical Exam  Constitutional: She is oriented to person, place, and time. She appears well-developed and well-nourished.  HENT:  Head: Normocephalic and atraumatic.  Cardiovascular: Normal rate, regular rhythm and normal heart sounds.   Pulmonary/Chest: Effort normal and breath sounds normal.  Neurological: She is alert and oriented to person, place, and time.  Psychiatric: She has a normal mood and affect. Her behavior is normal.          Assessment & Plan:  Medication management/Osteopenia- will get labs for reclast done and send pt for injection. Start back on vitamin D 800IU and Calcium 1200mg  daily. Will call with result of cMP.  Depression- will stop wellbutrin and start back up on celexa 20mg . Discussed follow up in next month or so. Talked about considering other medications to help. Wants to go back on what worked before.   Hypothyroidism - will get rechecked.   Diabetes/Impaired  fasting glucose- will check A1C today. Controlled in the past. Will stop metformin and see if continues to be controlled. Reminded of importance of healthy diet.

## 2012-09-01 LAB — COMPLETE METABOLIC PANEL WITH GFR
ALT: 15 U/L (ref 0–35)
AST: 20 U/L (ref 0–37)
Alkaline Phosphatase: 97 U/L (ref 39–117)
Creat: 0.73 mg/dL (ref 0.50–1.10)
Sodium: 136 mEq/L (ref 135–145)
Total Bilirubin: 0.3 mg/dL (ref 0.3–1.2)
Total Protein: 7.3 g/dL (ref 6.0–8.3)

## 2012-09-01 LAB — TSH: TSH: 9.4 u[IU]/mL — ABNORMAL HIGH (ref 0.350–4.500)

## 2012-09-04 ENCOUNTER — Other Ambulatory Visit: Payer: Self-pay | Admitting: Physician Assistant

## 2012-09-04 MED ORDER — LEVOTHYROXINE SODIUM 150 MCG PO TABS: 150.0000 ug | ORAL_TABLET | Freq: Every day | ORAL | Status: DC

## 2012-09-18 ENCOUNTER — Encounter: Payer: Self-pay | Admitting: *Deleted

## 2012-09-19 ENCOUNTER — Other Ambulatory Visit: Payer: Self-pay | Admitting: *Deleted

## 2012-09-19 DIAGNOSIS — E119 Type 2 diabetes mellitus without complications: Secondary | ICD-10-CM

## 2012-09-19 MED ORDER — METFORMIN HCL 850 MG PO TABS: 850.0000 mg | ORAL_TABLET | Freq: Two times a day (BID) | ORAL | Status: DC

## 2012-09-21 ENCOUNTER — Other Ambulatory Visit: Payer: Self-pay | Admitting: Physician Assistant

## 2012-09-22 ENCOUNTER — Other Ambulatory Visit: Payer: Self-pay | Admitting: *Deleted

## 2012-09-22 MED ORDER — ALPRAZOLAM 1 MG PO TABS: 1.0000 mg | ORAL_TABLET | Freq: Two times a day (BID) | ORAL | Status: DC | PRN

## 2012-09-28 ENCOUNTER — Other Ambulatory Visit (HOSPITAL_COMMUNITY): Payer: Self-pay | Admitting: Physician Assistant

## 2012-09-28 ENCOUNTER — Ambulatory Visit (HOSPITAL_COMMUNITY)
Admission: RE | Admit: 2012-09-28 | Payer: Medicare Other | Source: Ambulatory Visit | Attending: Family Medicine | Admitting: Family Medicine

## 2012-10-08 ENCOUNTER — Other Ambulatory Visit: Payer: Self-pay | Admitting: Physician Assistant

## 2012-10-26 ENCOUNTER — Other Ambulatory Visit (HOSPITAL_COMMUNITY): Payer: Self-pay | Admitting: Physician Assistant

## 2012-11-19 ENCOUNTER — Other Ambulatory Visit: Payer: Self-pay | Admitting: Family Medicine

## 2012-11-20 ENCOUNTER — Other Ambulatory Visit (HOSPITAL_COMMUNITY): Payer: Self-pay | Admitting: Physician Assistant

## 2012-11-22 ENCOUNTER — Telehealth: Payer: Self-pay | Admitting: *Deleted

## 2012-11-22 ENCOUNTER — Other Ambulatory Visit: Payer: Self-pay | Admitting: *Deleted

## 2012-11-22 DIAGNOSIS — E039 Hypothyroidism, unspecified: Secondary | ICD-10-CM

## 2012-11-22 NOTE — Telephone Encounter (Signed)
Pt calls & is requesting a refill of gabapentin.  She states that she thinks the last time she picked up a refill was may of 2014.  It isn't on her current med list.

## 2012-11-22 NOTE — Telephone Encounter (Signed)
Will refill. Need follow up.

## 2012-11-22 NOTE — Telephone Encounter (Signed)
TSH labs ordered

## 2012-11-23 ENCOUNTER — Other Ambulatory Visit: Payer: Self-pay | Admitting: *Deleted

## 2012-11-23 MED ORDER — GABAPENTIN 600 MG PO TABS: 600.0000 mg | ORAL_TABLET | Freq: Three times a day (TID) | ORAL | Status: DC

## 2012-11-23 NOTE — Telephone Encounter (Signed)
LMOM notifying pt of refill & the need for a f/u.

## 2012-11-27 LAB — TSH: TSH: 0.455 u[IU]/mL (ref 0.350–4.500)

## 2012-11-30 ENCOUNTER — Telehealth: Payer: Self-pay | Admitting: *Deleted

## 2012-11-30 NOTE — Telephone Encounter (Signed)
Pt calls and states that she heard that Elavil is similar to Cymbalta and helps with pain and wants to know if you think she could try this. States she will wait till you get back to address this as it is not an emergency. Barry Dienes, LPN

## 2012-12-04 NOTE — Telephone Encounter (Signed)
We can certainly try it. It does help with some nerve pain/headaches. Would you like to try it to replace? Are you on cymbalta right now? Would need to taper if so.

## 2012-12-04 NOTE — Telephone Encounter (Signed)
LMOM to return call for information. Barry Dienes, LPN

## 2012-12-05 NOTE — Telephone Encounter (Signed)
Pt returned call and states not on Cymbalta she is on Celexa and would like to try the Elavil. Pt has appointment with you tomorrow

## 2012-12-06 ENCOUNTER — Ambulatory Visit: Payer: Medicare Other | Admitting: Physician Assistant

## 2012-12-06 NOTE — Telephone Encounter (Signed)
Ok we will talk today about taper.

## 2012-12-07 ENCOUNTER — Other Ambulatory Visit: Payer: Self-pay

## 2012-12-07 ENCOUNTER — Telehealth: Payer: Self-pay | Admitting: *Deleted

## 2012-12-07 NOTE — Telephone Encounter (Signed)
Pt calls and wants to know if you can increase her dose of Celexa. States she is going thru a lot and feels really depressed. Barry Dienes, LPN

## 2012-12-08 ENCOUNTER — Other Ambulatory Visit: Payer: Self-pay | Admitting: Physician Assistant

## 2012-12-08 NOTE — Telephone Encounter (Signed)
If come in for office visit can switch celexa if not working.

## 2012-12-08 NOTE — Telephone Encounter (Signed)
Increasing dose of celexa means that you need to stop nexium or we need to switch PPI. Combination could make heart issues. What would you like to do?

## 2012-12-08 NOTE — Telephone Encounter (Signed)
Pt notifeid of instructions and she will keep on same dose of Celexa as she states she needs the Nexium. Barry Dienes, LPN

## 2012-12-19 ENCOUNTER — Other Ambulatory Visit: Payer: Self-pay

## 2012-12-19 ENCOUNTER — Other Ambulatory Visit: Payer: Self-pay | Admitting: Family Medicine

## 2012-12-19 MED ORDER — ALPRAZOLAM 1 MG PO TABS: ORAL_TABLET | ORAL | Status: DC

## 2012-12-22 ENCOUNTER — Encounter: Payer: Self-pay | Admitting: Physician Assistant

## 2012-12-22 ENCOUNTER — Ambulatory Visit (INDEPENDENT_AMBULATORY_CARE_PROVIDER_SITE_OTHER): Payer: Self-pay | Admitting: Physician Assistant

## 2012-12-22 VITALS — BP 130/69 | HR 91 | Wt 132.0 lb

## 2012-12-22 DIAGNOSIS — R21 Rash and other nonspecific skin eruption: Secondary | ICD-10-CM

## 2012-12-22 MED ORDER — PREDNISONE 20 MG PO TABS: ORAL_TABLET | ORAL | Status: DC

## 2012-12-22 MED ORDER — TRIAMCINOLONE ACETONIDE 0.05 % EX OINT: TOPICAL_OINTMENT | CUTANEOUS | Status: DC

## 2012-12-22 NOTE — Progress Notes (Signed)
  Subjective:    Patient ID: [name], female    DOB: 01-Apr-1951, 62 y.o.   MRN: [id number]  HPI Patient presents to the clinic with a rash that has been ongoing for 3 weeks. It started on her hands and arms and has traveled to her buttocks. She had been working in her yard and pulling weeds. Rash is small vesicles that have been leaking clear fluid. She did not have anything to put on them but has used [place]. The rash on her arms is much better but on her buttocks continuing to be very ithy. Denies any fever, chills, SoB, wheezing. She is concerned about shingles but has had shingles shot. Denies any pain associated.    Review of Systems     Objective:   Physical Exam  Skin:             Assessment & Plan:  RASH- I suspect some type of poison ivy contact dermatitis. Discussed that I did not think it was shingles because of the distribution and the fact that it was not painful. Gave prednisone taper and then gave ointment to used directly on rash. Follow up if not improving.

## 2012-12-25 ENCOUNTER — Telehealth: Payer: Self-pay

## 2012-12-25 NOTE — Telephone Encounter (Signed)
The Triamcinolone only comes in 0.5 % or 0.025% not 0.05%. Which do you prefer?

## 2012-12-25 NOTE — Telephone Encounter (Signed)
Advised pharmacy.

## 2012-12-25 NOTE — Telephone Encounter (Signed)
.  5 % is good

## 2013-01-02 ENCOUNTER — Telehealth: Payer: Self-pay | Admitting: *Deleted

## 2013-01-02 NOTE — Telephone Encounter (Signed)
Pt left message stating that her rash is still there & was wondering if she needs another round of prednisone.  Please advise

## 2013-01-02 NOTE — Telephone Encounter (Signed)
Did the first round help the rash and then it came back? Is she still itching?

## 2013-01-03 MED ORDER — PERMETHRIN 5 % EX CREA: TOPICAL_CREAM | Freq: Once | CUTANEOUS | Status: DC

## 2013-01-03 NOTE — Telephone Encounter (Signed)
At office visit scabies was on differential but not usual distribution. Try elimite cream head to toe one application and see if itching resolve as well as bumps. If not call office and schedule appt might need to biopsy.

## 2013-01-03 NOTE — Telephone Encounter (Signed)
Pt states that the rash seemed to ease up but it never really went away & never stopped itching completely.

## 2013-01-03 NOTE — Telephone Encounter (Signed)
Pt notified of new rx & recommendations.

## 2013-01-19 ENCOUNTER — Other Ambulatory Visit: Payer: Self-pay | Admitting: Physician Assistant

## 2013-01-22 ENCOUNTER — Other Ambulatory Visit: Payer: Self-pay | Admitting: Physician Assistant

## 2013-02-20 ENCOUNTER — Other Ambulatory Visit: Payer: Self-pay | Admitting: Physician Assistant

## 2013-03-01 ENCOUNTER — Ambulatory Visit: Payer: Medicare Other | Admitting: Sports Medicine

## 2013-03-06 ENCOUNTER — Other Ambulatory Visit: Payer: Self-pay | Admitting: Physician Assistant

## 2013-03-09 ENCOUNTER — Other Ambulatory Visit: Payer: Self-pay | Admitting: Physician Assistant

## 2013-03-12 ENCOUNTER — Ambulatory Visit: Payer: Medicare Other | Admitting: Physician Assistant

## 2013-03-14 ENCOUNTER — Ambulatory Visit (INDEPENDENT_AMBULATORY_CARE_PROVIDER_SITE_OTHER): Payer: Medicare Other | Admitting: Physician Assistant

## 2013-03-14 ENCOUNTER — Encounter: Payer: Self-pay | Admitting: Physician Assistant

## 2013-03-14 VITALS — BP 126/72 | HR 78 | Wt 131.0 lb

## 2013-03-14 DIAGNOSIS — E8881 Metabolic syndrome: Secondary | ICD-10-CM

## 2013-03-14 DIAGNOSIS — M25511 Pain in right shoulder: Secondary | ICD-10-CM

## 2013-03-14 DIAGNOSIS — M79601 Pain in right arm: Secondary | ICD-10-CM

## 2013-03-14 DIAGNOSIS — E88819 Insulin resistance, unspecified: Secondary | ICD-10-CM

## 2013-03-14 DIAGNOSIS — E119 Type 2 diabetes mellitus without complications: Secondary | ICD-10-CM

## 2013-03-14 DIAGNOSIS — M79609 Pain in unspecified limb: Secondary | ICD-10-CM

## 2013-03-14 DIAGNOSIS — M810 Age-related osteoporosis without current pathological fracture: Secondary | ICD-10-CM

## 2013-03-14 DIAGNOSIS — Z23 Encounter for immunization: Secondary | ICD-10-CM

## 2013-03-14 DIAGNOSIS — M25519 Pain in unspecified shoulder: Secondary | ICD-10-CM

## 2013-03-14 DIAGNOSIS — R21 Rash and other nonspecific skin eruption: Secondary | ICD-10-CM

## 2013-03-14 DIAGNOSIS — E039 Hypothyroidism, unspecified: Secondary | ICD-10-CM

## 2013-03-14 LAB — POCT GLYCOSYLATED HEMOGLOBIN (HGB A1C): Hemoglobin A1C: 5.4

## 2013-03-14 MED ORDER — TRIAMCINOLONE ACETONIDE 0.5 % EX CREA: TOPICAL_CREAM | Freq: Two times a day (BID) | CUTANEOUS | Status: DC

## 2013-03-14 MED ORDER — IBANDRONATE SODIUM 150 MG PO TABS: 150.0000 mg | ORAL_TABLET | ORAL | Status: DC

## 2013-03-14 MED ORDER — ALPRAZOLAM 1 MG PO TABS: ORAL_TABLET | ORAL | Status: DC
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
## 2013-03-14 NOTE — Patient Instructions (Addendum)
Follow up in 6 months in A1[facility].   Impingement Syndrome, Rotator Cuff, Bursitis with Rehab Impingement syndrome is a condition that involves inflammation of the tendons of the rotator cuff and the subacromial bursa, that causes pain in the shoulder. The rotator cuff consists of four tendons and muscles that control much of the shoulder and upper arm function. The subacromial bursa is a fluid filled sac that helps reduce friction between the rotator cuff and one of the bones of the shoulder (acromion). Impingement syndrome is usually an overuse injury that causes swelling of the bursa (bursitis), swelling of the tendon (tendonitis), and/or a tear of the tendon (strain). Strains are classified into three categories. Grade 1 strains cause pain, but the tendon is not lengthened. Grade 2 strains include a lengthened ligament, due to the ligament being stretched or partially ruptured. With grade 2 strains there is still function, although the function may be decreased. Grade 3 strains include a complete tear of the tendon or muscle, and function is usually impaired. SYMPTOMS   Pain around the shoulder, often at the outer portion of the upper arm.  Pain that gets worse with shoulder function, especially when reaching overhead or lifting.  Sometimes, aching when not using the arm.  Pain that wakes you up at night.  Sometimes, tenderness, swelling, warmth, or redness over the affected area.  Loss of strength.  Limited motion of the shoulder, especially reaching behind the back (to the back pocket or to unhook bra) or across your body.  Crackling sound (crepitation) when moving the arm.  Biceps tendon pain and inflammation (in the front of the shoulder). Worse when bending the elbow or lifting. CAUSES  Impingement syndrome is often an overuse injury, in which chronic (repetitive) motions cause the tendons or bursa to become inflamed. A strain occurs when a force is paced on the tendon or muscle that is  greater than it can withstand. Common mechanisms of injury include: Stress from sudden increase in duration, frequency, or intensity of training.  Direct hit (trauma) to the shoulder.  Aging, erosion of the tendon with normal use.  Bony bump on shoulder (acromial spur). RISK INCREASES WITH:  Contact sports (football, wrestling, boxing).  Throwing sports (baseball, tennis, volleyball).  Weightlifting and bodybuilding.  Heavy labor.  Previous injury to the rotator cuff, including impingement.  Poor shoulder strength and flexibility.  Failure to warm up properly before activity.  Inadequate protective equipment.  Old age.  Bony bump on shoulder (acromial spur). PREVENTION   Warm up and stretch properly before activity.  Allow for adequate recovery between workouts.  Maintain physical fitness:  Strength, flexibility, and endurance.  Cardiovascular fitness.  Learn and use proper exercise technique. PROGNOSIS  If treated properly, impingement syndrome usually goes away within 6 weeks. Sometimes surgery is required.  RELATED COMPLICATIONS   Longer healing time if not properly treated, or if not given enough time to heal.  Recurring symptoms, that result in a chronic condition.  Shoulder stiffness, frozen shoulder, or loss of motion.  Rotator cuff tendon tear.  Recurring symptoms, especially if activity is resumed too soon, with overuse, with a direct blow, or when using poor technique. TREATMENT  Treatment first involves the use of ice and medicine, to reduce pain and inflammation. The use of strengthening and stretching exercises may help reduce pain with activity. These exercises may be performed at home or with a therapist. If non-surgical treatment is unsuccessful after more than 6 months, surgery may be advised. After surgery  and rehabilitation, activity is usually possible in 3 months.  MEDICATION  If pain medicine is needed, nonsteroidal anti-inflammatory  medicines (aspirin and ibuprofen), or other minor pain relievers (acetaminophen), are often advised.  Do not take pain medicine for 7 days before surgery.  Prescription pain relievers may be given, if your caregiver thinks they are needed. Use only as directed and only as much as you need.  Corticosteroid injections may be given by your caregiver. These injections should be reserved for the most serious cases, because they may only be given a certain number of times. HEAT AND COLD  Cold treatment (icing) should be applied for 10 to 15 minutes every 2 to 3 hours for inflammation and pain, and immediately after activity that aggravates your symptoms. Use ice packs or an ice massage.  Heat treatment may be used before performing stretching and strengthening activities prescribed by your caregiver, physical therapist, or athletic trainer. Use a heat pack or a warm water soak. SEEK MEDICAL CARE IF:   Symptoms get worse or do not improve in 4 to 6 weeks, despite treatment.  New, unexplained symptoms develop. (Drugs used in treatment may produce side effects.) EXERCISES  RANGE OF MOTION (ROM) AND STRETCHING EXERCISES - Impingement Syndrome (Rotator Cuff  Tendinitis, Bursitis) These exercises may help you when beginning to rehabilitate your injury. Your symptoms may go away with or without further involvement from your physician, physical therapist or athletic trainer. While completing these exercises, remember:   Restoring tissue flexibility helps normal motion to return to the joints. This allows healthier, less painful movement and activity.  An effective stretch should be held for at least 30 seconds.  A stretch should never be painful. You should only feel a gentle lengthening or release in the stretched tissue. STRETCH  Flexion, Standing  Stand with good posture. With an underhand grip on your right / left hand, and an overhand grip on the opposite hand, grasp a broomstick or cane so that  your hands are a little more than shoulder width apart.  Keeping your right / left elbow straight and shoulder muscles relaxed, push the stick with your opposite hand, to raise your right / left arm in front of your body and then overhead. Raise your arm until you feel a stretch in your right / left shoulder, but before you have increased shoulder pain.  Try to avoid shrugging your right / left shoulder as your arm rises, by keeping your shoulder blade tucked down and toward your mid-back spine. Hold for __________ seconds.  Slowly return to the starting position. Repeat __________ times. Complete this exercise __________ times per day. STRETCH  Abduction, Supine  Lie on your back. With an underhand grip on your right / left hand and an overhand grip on the opposite hand, grasp a broomstick or cane so that your hands are a little more than shoulder width apart.  Keeping your right / left elbow straight and your shoulder muscles relaxed, push the stick with your opposite hand, to raise your right / left arm out to the side of your body and then overhead. Raise your arm until you feel a stretch in your right / left shoulder, but before you have increased shoulder pain.  Try to avoid shrugging your right / left shoulder as your arm rises, by keeping your shoulder blade tucked down and toward your mid-back spine. Hold for __________ seconds.  Slowly return to the starting position. Repeat __________ times. Complete this exercise __________ times per  day. ROM  Flexion, Active-Assisted  Lie on your back. You may bend your knees for comfort.  Grasp a broomstick or cane so your hands are about shoulder width apart. Your right / left hand should grip the end of the stick, so that your hand is positioned "thumbs-up," as if you were about to shake hands.  Using your healthy arm to lead, raise your right / left arm overhead, until you feel a gentle stretch in your shoulder. Hold for __________  seconds.  Use the stick to assist in returning your right / left arm to its starting position. Repeat __________ times. Complete this exercise __________ times per day.  ROM - Internal Rotation, Supine   Lie on your back on a firm surface. Place your right / left elbow about 60 degrees away from your side. Elevate your elbow with a folded towel, so that the elbow and shoulder are the same height.  Using a broomstick or cane and your strong arm, pull your right / left hand toward your body until you feel a gentle stretch, but no increase in your shoulder pain. Keep your shoulder and elbow in place throughout the exercise.  Hold for __________ seconds. Slowly return to the starting position. Repeat __________ times. Complete this exercise __________ times per day. STRETCH - Internal Rotation  Place your right / left hand behind your back, palm up.  Throw a towel or belt over your opposite shoulder. Grasp the towel with your right / left hand.  While keeping an upright posture, gently pull up on the towel, until you feel a stretch in the front of your right / left shoulder.  Avoid shrugging your right / left shoulder as your arm rises, by keeping your shoulder blade tucked down and toward your mid-back spine.  Hold for __________ seconds. Release the stretch, by lowering your healthy hand. Repeat __________ times. Complete this exercise __________ times per day. ROM - Internal Rotation   Using an underhand grip, grasp a stick behind your back with both hands.  While standing upright with good posture, slide the stick up your back until you feel a mild stretch in the front of your shoulder.  Hold for __________ seconds. Slowly return to your starting position. Repeat __________ times. Complete this exercise __________ times per day.  STRETCH  Posterior Shoulder Capsule   Stand or sit with good posture. Grasp your right / left elbow and draw it across your chest, keeping it at the same  height as your shoulder.  Pull your elbow, so your upper arm comes in closer to your chest. Pull until you feel a gentle stretch in the back of your shoulder.  Hold for __________ seconds. Repeat __________ times. Complete this exercise __________ times per day. STRENGTHENING EXERCISES - Impingement Syndrome (Rotator Cuff Tendinitis, Bursitis) These exercises may help you when beginning to rehabilitate your injury. They may resolve your symptoms with or without further involvement from your physician, physical therapist or athletic trainer. While completing these exercises, remember:  Muscles can gain both the endurance and the strength needed for everyday activities through controlled exercises.  Complete these exercises as instructed by your physician, physical therapist or athletic trainer. Increase the resistance and repetitions only as guided.  You may experience muscle soreness or fatigue, but the pain or discomfort you are trying to eliminate should never worsen during these exercises. If this pain does get worse, stop and make sure you are following the directions exactly. If the pain is still  present after adjustments, discontinue the exercise until you can discuss the trouble with your clinician.  During your recovery, avoid activity or exercises which involve actions that place your injured hand or elbow above your head or behind your back or head. These positions stress the tissues which you are trying to heal. STRENGTH - Scapular Depression and Adduction   With good posture, sit on a firm chair. Support your arms in front of you, with pillows, arm rests, or on a table top. Have your elbows in line with the sides of your body.  Gently draw your shoulder blades down and toward your mid-back spine. Gradually increase the tension, without tensing the muscles along the top of your shoulders and the back of your neck.  Hold for __________ seconds. Slowly release the tension and relax  your muscles completely before starting the next repetition.  After you have practiced this exercise, remove the arm support and complete the exercise in standing as well as sitting position. Repeat __________ times. Complete this exercise __________ times per day.  STRENGTH - Shoulder Abductors, Isometric  With good posture, stand or sit about 4-6 inches from a wall, with your right / left side facing the wall.  Bend your right / left elbow. Gently press your right / left elbow into the wall. Increase the pressure gradually, until you are pressing as hard as you can, without shrugging your shoulder or increasing any shoulder discomfort.  Hold for __________ seconds.  Release the tension slowly. Relax your shoulder muscles completely before you begin the next repetition. Repeat __________ times. Complete this exercise __________ times per day.  STRENGTH - External Rotators, Isometric  Keep your right / left elbow at your side and bend it 90 degrees.  Step into a door frame so that the outside of your right / left wrist can press against the door frame without your upper arm leaving your side.  Gently press your right / left wrist into the door frame, as if you were trying to swing the back of your hand away from your stomach. Gradually increase the tension, until you are pressing as hard as you can, without shrugging your shoulder or increasing any shoulder discomfort.  Hold for __________ seconds.  Release the tension slowly. Relax your shoulder muscles completely before you begin the next repetition. Repeat __________ times. Complete this exercise __________ times per day.  STRENGTH - Supraspinatus   Stand or sit with good posture. Grasp a __________ weight, or an exercise band or tubing, so that your hand is "thumbs-up," like you are shaking hands.  Slowly lift your right / left arm in a "V" away from your thigh, diagonally into the space between your side and straight ahead. Lift  your hand to shoulder height or as far as you can, without increasing any shoulder pain. At first, many people do not lift their hands above shoulder height.  Avoid shrugging your right / left shoulder as your arm rises, by keeping your shoulder blade tucked down and toward your mid-back spine.  Hold for __________ seconds. Control the descent of your hand, as you slowly return to your starting position. Repeat __________ times. Complete this exercise __________ times per day.  STRENGTH - External Rotators  Secure a rubber exercise band or tubing to a fixed object (table, pole) so that it is at the same height as your right / left elbow when you are standing or sitting on a firm surface.  Stand or sit so that the  secured exercise band is at your uninjured side.  Bend your right / left elbow 90 degrees. Place a folded towel or small pillow under your right / left arm, so that your elbow is a few inches away from your side.  Keeping the tension on the exercise band, pull it away from your body, as if pivoting on your elbow. Be sure to keep your body steady, so that the movement is coming only from your rotating shoulder.  Hold for __________ seconds. Release the tension in a controlled manner, as you return to the starting position. Repeat __________ times. Complete this exercise __________ times per day.  STRENGTH - Internal Rotators   Secure a rubber exercise band or tubing to a fixed object (table, pole) so that it is at the same height as your right / left elbow when you are standing or sitting on a firm surface.  Stand or sit so that the secured exercise band is at your right / left side.  Bend your elbow 90 degrees. Place a folded towel or small pillow under your right / left arm so that your elbow is a few inches away from your side.  Keeping the tension on the exercise band, pull it across your body, toward your stomach. Be sure to keep your body steady, so that the movement is coming  only from your rotating shoulder.  Hold for __________ seconds. Release the tension in a controlled manner, as you return to the starting position. Repeat __________ times. Complete this exercise __________ times per day.  STRENGTH - Scapular Protractors, Standing   Stand arms length away from a wall. Place your hands on the wall, keeping your elbows straight.  Begin by dropping your shoulder blades down and toward your mid-back spine.  To strengthen your protractors, keep your shoulder blades down, but slide them forward on your rib cage. It will feel as if you are lifting the back of your rib cage away from the wall. This is a subtle motion and can be challenging to complete. Ask your caregiver for further instruction, if you are not sure you are doing the exercise correctly.  Hold for __________ seconds. Slowly return to the starting position, resting the muscles completely before starting the next repetition. Repeat __________ times. Complete this exercise __________ times per day. STRENGTH - Scapular Protractors, Supine  Lie on your back on a firm surface. Extend your right / left arm straight into the air while holding a __________ weight in your hand.  Keeping your head and back in place, lift your shoulder off the floor.  Hold for __________ seconds. Slowly return to the starting position, and allow your muscles to relax completely before starting the next repetition. Repeat __________ times. Complete this exercise __________ times per day. STRENGTH - Scapular Protractors, Quadruped  Get onto your hands and knees, with your shoulders directly over your hands (or as close as you can be, comfortably).  Keeping your elbows locked, lift the back of your rib cage up into your shoulder blades, so your mid-back rounds out. Keep your neck muscles relaxed.  Hold this position for __________ seconds. Slowly return to the starting position and allow your muscles to relax completely before  starting the next repetition. Repeat __________ times. Complete this exercise __________ times per day.  STRENGTH - Scapular Retractors  Secure a rubber exercise band or tubing to a fixed object (table, pole), so that it is at the height of your shoulders when you are either standing,  or sitting on a firm armless chair.  With a palm down grip, grasp an end of the band in each hand. Straighten your elbows and lift your hands straight in front of you, at shoulder height. Step back, away from the secured end of the band, until it becomes tense.  Squeezing your shoulder blades together, draw your elbows back toward your sides, as you bend them. Keep your upper arms lifted away from your body throughout the exercise.  Hold for __________ seconds. Slowly ease the tension on the band, as you reverse the directions and return to the starting position. Repeat __________ times. Complete this exercise __________ times per day. STRENGTH - Shoulder Extensors   Secure a rubber exercise band or tubing to a fixed object (table, pole) so that it is at the height of your shoulders when you are either standing, or sitting on a firm armless chair.  With a thumbs-up grip, grasp an end of the band in each hand. Straighten your elbows and lift your hands straight in front of you, at shoulder height. Step back, away from the secured end of the band, until it becomes tense.  Squeezing your shoulder blades together, pull your hands down to the sides of your thighs. Do not allow your hands to go behind you.  Hold for __________ seconds. Slowly ease the tension on the band, as you reverse the directions and return to the starting position. Repeat __________ times. Complete this exercise __________ times per day.  STRENGTH - Scapular Retractors and External Rotators   Secure a rubber exercise band or tubing to a fixed object (table, pole) so that it is at the height as your shoulders, when you are either standing, or  sitting on a firm armless chair.  With a palm down grip, grasp an end of the band in each hand. Bend your elbows 90 degrees and lift your elbows to shoulder height, at your sides. Step back, away from the secured end of the band, until it becomes tense.  Squeezing your shoulder blades together, rotate your shoulders so that your upper arms and elbows remain stationary, but your fists travel upward to head height.  Hold for __________ seconds. Slowly ease the tension on the band, as you reverse the directions and return to the starting position. Repeat __________ times. Complete this exercise __________ times per day.  STRENGTH - Scapular Retractors and External Rotators, Rowing   Secure a rubber exercise band or tubing to a fixed object (table, pole) so that it is at the height of your shoulders, when you are either standing, or sitting on a firm armless chair.  With a palm down grip, grasp an end of the band in each hand. Straighten your elbows and lift your hands straight in front of you, at shoulder height. Step back, away from the secured end of the band, until it becomes tense.  Step 1: Squeeze your shoulder blades together. Bending your elbows, draw your hands to your chest, as if you are rowing a boat. At the end of this motion, your hands and elbow should be at shoulder height and your elbows should be out to your sides.  Step 2: Rotate your shoulders, to raise your hands above your head. Your forearms should be vertical and your upper arms should be horizontal.  Hold for __________ seconds. Slowly ease the tension on the band, as you reverse the directions and return to the starting position. Repeat __________ times. Complete this exercise __________ times per day.  STRENGTH  Scapular Depressors  Find a sturdy chair without wheels, such as a dining room chair.  Keeping your feet on the floor, and your hands on the chair arms, lift your bottom up from the seat, and lock your  elbows.  Keeping your elbows straight, allow gravity to pull your body weight down. Your shoulders will rise toward your ears.  Raise your body against gravity by drawing your shoulder blades down your back, shortening the distance between your shoulders and ears. Although your feet should always maintain contact with the floor, your feet should progressively support less body weight, as you get stronger.  Hold for __________ seconds. In a controlled and slow manner, lower your body weight to begin the next repetition. Repeat __________ times. Complete this exercise __________ times per day.  Document Released: 05/17/2005 Document Revised: 08/09/2011 Document Reviewed: 08/29/2008 [facility] Patient Information 2014 [place], [place].

## 2013-03-16 LAB — TSH: TSH: 3.856 u[IU]/mL (ref 0.350–4.500)

## 2013-03-16 LAB — T4, FREE: Free T4: 0.92 ng/dL (ref 0.80–1.80)

## 2013-03-21 ENCOUNTER — Other Ambulatory Visit: Payer: Self-pay | Admitting: Physician Assistant

## 2013-04-17 ENCOUNTER — Telehealth: Payer: Self-pay | Admitting: *Deleted

## 2013-04-17 NOTE — Telephone Encounter (Signed)
Pt called asking if you could increase her citalopram.  She states that you guys discussed it at her last appt.  If so, she uses [facility]. Please advise

## 2013-04-18 ENCOUNTER — Other Ambulatory Visit: Payer: Self-pay | Admitting: Physician Assistant

## 2013-04-18 NOTE — Telephone Encounter (Signed)
We had talked and because on nexium or any PPI does not recommend doses of Celexa to be higher than 20mg . Would you like to try another SSRI.

## 2013-04-18 NOTE — Telephone Encounter (Signed)
Pt notified & would like to just stay on the 20mg  for now.

## 2013-04-20 ENCOUNTER — Other Ambulatory Visit: Payer: Self-pay | Admitting: Physician Assistant

## 2013-05-02 ENCOUNTER — Other Ambulatory Visit: Payer: Self-pay | Admitting: Physician Assistant

## 2013-05-04 ENCOUNTER — Encounter: Payer: Self-pay | Admitting: Sports Medicine

## 2013-05-04 ENCOUNTER — Ambulatory Visit (INDEPENDENT_AMBULATORY_CARE_PROVIDER_SITE_OTHER): Payer: Medicare Other | Admitting: Sports Medicine

## 2013-05-04 VITALS — BP 132/74 | HR 78 | Wt 130.0 lb

## 2013-05-04 DIAGNOSIS — M76899 Other specified enthesopathies of unspecified lower limb, excluding foot: Secondary | ICD-10-CM

## 2013-05-04 DIAGNOSIS — M7061 Trochanteric bursitis, right hip: Secondary | ICD-10-CM | POA: Insufficient documentation

## 2013-05-04 MED ORDER — KETOROLAC TROMETHAMINE 30 MG/ML IJ SOLN
30.0000 mg | Freq: Once | INTRAMUSCULAR | Status: AC
  Administered 2013-05-04: 30 mg via INTRAMUSCULAR

## 2013-05-04 NOTE — Addendum Note (Signed)
Addended by: Pixie Casino on: 05/04/2013 02:26 PM   Modules accepted: Orders

## 2013-05-04 NOTE — Assessment & Plan Note (Signed)
Injection as above. Toradol 30 mg intramuscular. Home rehabilitation.  Return as needed, we can do this injection up to 4 times per year.

## 2013-05-04 NOTE — Progress Notes (Signed)
  Subjective:    CC: Hip pain  HPI: This is a pleasant 62 year old female, for several weeks she's had pain she localizes over the right greater trochanter, radiating down to the knee. It's worse with ambulation, and worse when lying on her right side. Symptoms are severe, persistent. Sharp.  Past medical history, Surgical history, Family history not pertinant except as noted below, Social history, Allergies, and medications have been entered into the medical record, reviewed, and no changes needed.   Review of Systems: No fevers, chills, night sweats, weight loss, chest pain, or shortness of breath.   Objective:    General: Well Developed, well nourished, and in no acute distress.  Neuro: Alert and oriented x3, extra-ocular muscles intact, sensation grossly intact.  HEENT: Normocephalic, atraumatic, pupils equal round reactive to light, neck supple, no masses, no lymphadenopathy, thyroid nonpalpable.  Skin: Warm and dry, no rashes. Cardiac: Regular rate and rhythm, no murmurs rubs or gallops, no lower extremity edema.  Respiratory: Clear to auscultation bilaterally. Not using accessory muscles, speaking in full sentences. Right Hip: ROM IR: 45 Deg, ER: 45 Deg, Flexion: 120 Deg, Extension: 100 Deg, Abduction: 45 Deg, Adduction: 45 Deg Strength IR: 5/5, ER: 5/5, Flexion: 5/5, Extension: 5/5, Abduction: 5/5, Adduction: 5/5 Pelvic alignment unremarkable to inspection and palpation. Standing hip rotation and gait without trendelenburg sign / unsteadiness. Tender to palpation over the greater trochanter.  Procedure:  Injection of right greater trochanter Consent obtained and verified. Time-out conducted. Noted no overlying erythema, induration, or other signs of local infection. Skin prepped in a sterile fashion. Topical analgesic spray: Ethyl chloride. Completed without difficulty. Meds: Using a spinal needle, this was advanced to the greater trochanter, a total 1 cc Kenalog 40, 4 cc  lidocaine injected easily. Pain immediately improved suggesting accurate placement of the medication. Advised to call if fevers/chills, erythema, induration, drainage, or persistent bleeding.  The patient was then given 30 mg of intramuscular Toradol per request.   Assessment/Plan

## 2013-05-16 ENCOUNTER — Encounter: Payer: Self-pay | Admitting: Physician Assistant

## 2013-05-16 ENCOUNTER — Ambulatory Visit (INDEPENDENT_AMBULATORY_CARE_PROVIDER_SITE_OTHER): Payer: Medicare Other | Admitting: Physician Assistant

## 2013-05-16 VITALS — BP 150/87 | HR 80 | Wt 127.0 lb

## 2013-05-16 DIAGNOSIS — F329 Major depressive disorder, single episode, unspecified: Secondary | ICD-10-CM

## 2013-05-16 DIAGNOSIS — F411 Generalized anxiety disorder: Secondary | ICD-10-CM

## 2013-05-16 DIAGNOSIS — F3289 Other specified depressive episodes: Secondary | ICD-10-CM

## 2013-05-16 MED ORDER — BUPROPION HCL ER (XL) 300 MG PO TB24: 300.0000 mg | ORAL_TABLET | Freq: Every day | ORAL | Status: DC

## 2013-05-16 MED ORDER — ESCITALOPRAM OXALATE 10 MG PO TABS: 10.0000 mg | ORAL_TABLET | Freq: Every day | ORAL | Status: DC

## 2013-05-16 MED ORDER — CLONAZEPAM 1 MG PO TABS: 1.0000 mg | ORAL_TABLET | Freq: Two times a day (BID) | ORAL | Status: DC

## 2013-05-16 NOTE — Patient Instructions (Signed)
Stop celexa. Start lexapro and increase wellbutrin to 300mg  daily.

## 2013-05-17 NOTE — Progress Notes (Signed)
   Subjective:    Patient ID: [name], female    DOB: Jan 18, 1951, 62 y.o.   MRN: [id number]  HPI Pt presents to the clinic to discuss depression and anxiety medications. Per insurance company they will not pay for xanax any more and would like to try klonapin or Valium. Pt has been on celexa for a while and does not feel like it is helping her anxiety and depression as much. She is just overwhelmed with pain and her life right now. Her husband lost his job and they are going to have to relocate. She has tried zoloft and effexor in the past and neither worked very effectively.    Review of Systems     Objective:   Physical Exam  Constitutional: She is oriented to person, place, and time. She appears well-developed and well-nourished.  HENT:  Head: Normocephalic and atraumatic.  Cardiovascular: Normal rate, regular rhythm and normal heart sounds.   Pulmonary/Chest: Effort normal and breath sounds normal.  Neurological: She is alert and oriented to person, place, and time.  Skin: Skin is warm and dry.  Psychiatric: She has a normal mood and affect. Her behavior is normal.          Assessment & Plan:  Anxiety/depression- Stop celexa. Start lexapro and increase wellbutrin to 300mg . Discussed Brintellix but patient is on medicare and worried about the cost. We will hold off on trying a branded medication at this time. Stop xanax and try klonapin twice a day. Discussed SE of xanax and how cognitively klonapin is much better. Follow up in 6 weeks.

## 2013-05-25 ENCOUNTER — Telehealth: Payer: Self-pay | Admitting: *Deleted

## 2013-05-25 ENCOUNTER — Other Ambulatory Visit: Payer: Self-pay | Admitting: Physician Assistant

## 2013-05-25 MED ORDER — ALPRAZOLAM 1 MG PO TABS: 1.0000 mg | ORAL_TABLET | Freq: Two times a day (BID) | ORAL | Status: DC | PRN

## 2013-05-25 NOTE — Telephone Encounter (Signed)
Pt called & states that her ins will no longer cover xanax beginning the 1st of the year.  Per last office note, you had wanted her to stop the xanax & do klonopin bid.  She states that it helps her during the day but not at night.  She is asking if you will give her just one more month of the xanax.

## 2013-05-25 NOTE — Telephone Encounter (Signed)
I will go for one month the patient does not need to use Xanax and Klonopin together.

## 2013-05-25 NOTE — Telephone Encounter (Signed)
Pt notified & voiced understanding.

## 2013-06-20 ENCOUNTER — Telehealth: Payer: Self-pay

## 2013-06-20 NOTE — Telephone Encounter (Signed)
[name] states she is ready for a refill on clonazepam 1 mg bid # 60. She states her last prescription was for 20 tablets. Is it ok to refill for 60?

## 2013-06-21 MED ORDER — CLONAZEPAM 1 MG PO TABS: 1.0000 mg | ORAL_TABLET | Freq: Two times a day (BID) | ORAL | Status: DC

## 2013-06-21 NOTE — Telephone Encounter (Signed)
I see where in December we prescribed both klonapin and xanax. Which benzo are you wanting to stay on regularly. I do not want to keep prescribing both.

## 2013-06-21 NOTE — Telephone Encounter (Signed)
She would like to keep taking the clonazepam.

## 2013-06-21 NOTE — Telephone Encounter (Signed)
Ok for 60 BID with 1 refill for clonazepam.

## 2013-06-25 ENCOUNTER — Other Ambulatory Visit: Payer: Self-pay | Admitting: Physician Assistant

## 2013-07-05 ENCOUNTER — Telehealth: Payer: Self-pay | Admitting: *Deleted

## 2013-07-05 NOTE — Telephone Encounter (Signed)
Pt called & asked if you could start her on fosamax & put in a referral for a colonoscopy.

## 2013-07-10 ENCOUNTER — Other Ambulatory Visit: Payer: Self-pay | Admitting: Physician Assistant

## 2013-07-10 DIAGNOSIS — Z1211 Encounter for screening for malignant neoplasm of colon: Secondary | ICD-10-CM

## 2013-07-10 MED ORDER — ALENDRONATE SODIUM 70 MG PO TABS: 70.0000 mg | ORAL_TABLET | ORAL | Status: DC

## 2013-07-10 NOTE — Telephone Encounter (Signed)
Pt informed.[name] [name]  

## 2013-07-10 NOTE — Telephone Encounter (Signed)
Referral placed. Fosamax sent.

## 2013-07-11 ENCOUNTER — Ambulatory Visit: Payer: Medicare Other | Admitting: Physician Assistant

## 2013-07-13 ENCOUNTER — Ambulatory Visit (INDEPENDENT_AMBULATORY_CARE_PROVIDER_SITE_OTHER): Payer: Medicare Other | Admitting: Physician Assistant

## 2013-07-13 VITALS — BP 150/73 | HR 80 | Temp 98.7°F

## 2013-07-13 DIAGNOSIS — E8881 Metabolic syndrome: Secondary | ICD-10-CM

## 2013-07-13 DIAGNOSIS — M792 Neuralgia and neuritis, unspecified: Secondary | ICD-10-CM

## 2013-07-13 DIAGNOSIS — I10 Essential (primary) hypertension: Secondary | ICD-10-CM

## 2013-07-13 DIAGNOSIS — M4802 Spinal stenosis, cervical region: Secondary | ICD-10-CM

## 2013-07-13 DIAGNOSIS — F411 Generalized anxiety disorder: Secondary | ICD-10-CM

## 2013-07-13 DIAGNOSIS — M6740 Ganglion, unspecified site: Secondary | ICD-10-CM

## 2013-07-13 DIAGNOSIS — M67439 Ganglion, unspecified wrist: Secondary | ICD-10-CM

## 2013-07-13 DIAGNOSIS — IMO0002 Reserved for concepts with insufficient information to code with codable children: Secondary | ICD-10-CM

## 2013-07-13 DIAGNOSIS — M67472 Ganglion, left ankle and foot: Secondary | ICD-10-CM | POA: Insufficient documentation

## 2013-07-13 LAB — POCT GLYCOSYLATED HEMOGLOBIN (HGB A1C): HEMOGLOBIN A1C: 6

## 2013-07-13 MED ORDER — ALPRAZOLAM 1 MG PO TABS: 1.0000 mg | ORAL_TABLET | Freq: Every day | ORAL | Status: DC | PRN

## 2013-07-13 NOTE — Progress Notes (Signed)
   Subjective:    Patient ID: [name], female    DOB: 1950-11-28, 63 y.o.   MRN: [id number]  HPI Pt presents to the clinic to follow up on neuropathy. The burning pain coming for right side down the backs of her legs into the back of knee is ongoing but seems to be worse. She sees pain clinic for management of this. She has had nerve blocks and epidurals which have not helped. She is concerned it could be worsening due to diabetes. She has impaired fasting glucose but wants rechecked.   klonapin just is not working like xanax would like to switch back.  Nodule on left wrist present for months. Starting to cause pain and difficultly with range of motion. Nothing tried to make better. Movement of wrist makes worse.        Review of Systems     Objective:   Physical Exam  Constitutional: She is oriented to person, place, and time. She appears well-developed and well-nourished.  HENT:  Head: Normocephalic and atraumatic.  Cardiovascular: Normal rate, regular rhythm and normal heart sounds.   Pulmonary/Chest: Effort normal and breath sounds normal.  Neurological: She is alert and oriented to person, place, and time.  Skin:  Mobile ganglion cyst of left lateral wrist radial side.   Psychiatric: She has a normal mood and affect. Her behavior is normal.          Assessment & Plan:  Impaired fasting glucose/Neuropathy- I reassured pt that at 6.0. A1C the worsening radiating pain down right leg is not due to neuropathy. Diabetic foot exam done today and normal. She needs to follow up with pain management for potential increase in neurontin and orthopedist to revaluate to make sure nothing surgically could be done.   HTN- BP just a tad elevated today. Will continue to monitor.   Anxiety- switched klonapin back to xanax. Discussed side effects and sedation. Pt aware. Only use as needed.   Ganglion of left wrist- starting to cause pain. Pt already seeing ortho and suggested follow  up.

## 2013-07-16 ENCOUNTER — Encounter: Payer: Self-pay | Admitting: Physician Assistant

## 2013-07-26 ENCOUNTER — Other Ambulatory Visit: Payer: Self-pay | Admitting: Physician Assistant

## 2013-08-06 ENCOUNTER — Telehealth: Payer: Self-pay | Admitting: *Deleted

## 2013-08-06 NOTE — Telephone Encounter (Signed)
Pt calls and LM on voicemail in triage wanting to know if you would change her back to Celexa- states that the Wellbutrin and Effexor cost more than the Celexa did and the combo is not working anyway.  Also states that she has never gotten a call about someone setting up her colonoscopy either. Barry Dienes, LPN

## 2013-08-07 ENCOUNTER — Other Ambulatory Visit: Payer: Self-pay | Admitting: Physician Assistant

## 2013-08-07 NOTE — Telephone Encounter (Signed)
Have you ever tried lexapro?   Pt can call digestive health. It has been sent and approved. They should have called.

## 2013-08-08 NOTE — Telephone Encounter (Signed)
Spoke with Renessa & she said that she had taken Lexapro a long time ago.

## 2013-08-08 NOTE — Telephone Encounter (Signed)
Would you want to try lexapro?

## 2013-08-09 NOTE — Telephone Encounter (Signed)
Pt would rather have Celexa.

## 2013-08-10 ENCOUNTER — Other Ambulatory Visit: Payer: Self-pay | Admitting: Physician Assistant

## 2013-08-10 MED ORDER — CITALOPRAM HYDROBROMIDE 20 MG PO TABS: 20.0000 mg | ORAL_TABLET | Freq: Every day | ORAL | Status: DC

## 2013-08-10 NOTE — Telephone Encounter (Signed)
sent 

## 2013-08-29 ENCOUNTER — Encounter: Payer: Self-pay | Admitting: Physician Assistant

## 2013-08-29 ENCOUNTER — Ambulatory Visit (INDEPENDENT_AMBULATORY_CARE_PROVIDER_SITE_OTHER): Payer: Medicare Other | Admitting: Physician Assistant

## 2013-08-29 VITALS — BP 109/68 | HR 84 | Ht 63.0 in | Wt 132.0 lb

## 2013-08-29 DIAGNOSIS — L600 Ingrowing nail: Secondary | ICD-10-CM

## 2013-08-29 DIAGNOSIS — M25519 Pain in unspecified shoulder: Secondary | ICD-10-CM

## 2013-08-29 DIAGNOSIS — M25511 Pain in right shoulder: Secondary | ICD-10-CM

## 2013-08-29 MED ORDER — MELOXICAM 15 MG PO TABS: 15.0000 mg | ORAL_TABLET | Freq: Every day | ORAL | Status: DC

## 2013-08-29 NOTE — Progress Notes (Signed)
   Subjective:    Patient ID: [name], female    DOB: 04/12/1951, 63 y.o.   MRN: [id number]  HPI Pt presents to the clinic with right shoulder pain and left ingrown toenail.    Right shoulder pain- pt fell 6 weeks ago on right side. Her shoulder continues to hurt. Pain worse with external rotation and flexion. She feels like pain is radiating down bicep. Feels like her strength is decreasing in right arm. 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 Not tried anything to make better. On chronic opioids for pain control. Does not feel like helping this.    Left toenail medial side great toe very painful for weeks. Now not able to wear shoes without pain. No drainage or redness. More pain than anything.    Review of Systems     Objective:   Physical Exam  Constitutional: She is oriented to person, place, and time. She appears well-developed and well-nourished.  HENT:  Head: Normocephalic and atraumatic.  Cardiovascular: Normal rate, regular rhythm and normal heart sounds.   Pulmonary/Chest: Effort normal and breath sounds normal.  Musculoskeletal:  Right shoulder- decreased ROM due to pain abduction to 130 degrees. Pain over anterior shoulder to palpation where bicep tendon inserts. Pain over posterior shoulder over coracoid process. Strength of right arm decreased 4/5. Pain with external ROM and flexion. Hand grip 4/5. No signifcant muscle atrophy.   Neurological: She is alert and oriented to person, place, and time.  Skin:  Great ingrown toenail left medial side. No erythema. Mild swelling medially. Pain with palpation over medial great toe.   Psychiatric: She has a normal mood and affect. Her behavior is normal.          Assessment & Plan:  Right shoulder pain-  No xrays were obtained since 6 weeks after fall. after PE seems like a lot of pain with palpation over bicep insertion at the shoulder. Could also be some subacromional bursitis. I gave stretches for both. Gave rx  for mobic. Would like for her to try mobic instead of diclofenac. Pt aware only to take one NSAID. If not improving make appt with Dr. Karie Schwalbe to consider injection. Do exercises twice a day. Ice shoulder.   Left ingrown toenail-   Toenail Avulsion Procedure Note  Pre-operative Diagnosis: Left Ingrown Great toenail   Post-operative Diagnosis: Left Ingrown Great toenail  Indications: pain  Anesthesia: Lidocaine 2% without epinephrine without added sodium bicarbonate  Procedure Details  History of allergy to iodine: no  The risks (including bleeding and infection) and benefits of the  procedure and Verbal informed consent obtained.  After digital block anesthesia was obtained, a tourniquet was applied for hemostasis during the procedure.  After prepping with Betadine, the offending edge of the nail was freed from the nailbed and perionychium, and then split with scissors and removed with  forceps.  All visible granulation tissue is debrided. Antibiotic and bulky dressing was applied.   Findings: Ingrown great left toenail  Complications: none.  Plan: 1. Soak the foot twice daily. Change dressing twice daily until healed over. 2. Warning signs of infection were reviewed.   3. Recommended that the patient use pt is on chronic pain meds. no other pain meds were given.  as needed for pain.

## 2013-09-21 ENCOUNTER — Encounter: Payer: Self-pay | Admitting: Physician Assistant

## 2013-09-21 ENCOUNTER — Ambulatory Visit (INDEPENDENT_AMBULATORY_CARE_PROVIDER_SITE_OTHER): Payer: Medicare Other | Admitting: Physician Assistant

## 2013-09-21 VITALS — BP 150/78 | HR 87 | Wt 134.0 lb

## 2013-09-21 DIAGNOSIS — M25519 Pain in unspecified shoulder: Secondary | ICD-10-CM

## 2013-09-21 DIAGNOSIS — M25511 Pain in right shoulder: Secondary | ICD-10-CM

## 2013-09-21 DIAGNOSIS — Z23 Encounter for immunization: Secondary | ICD-10-CM

## 2013-09-21 NOTE — Progress Notes (Signed)
   Subjective:    Patient ID: [name], female    DOB: [birth year], 63 y.o.   MRN: [id number]  HPI Pt presents to the clinic with continued right shoulder pain. She declined injection at last visit. She tried mobic instead of diclofenac and received no relief.    Review of Systems     Objective:   Physical Exam  HENT:  Head: Normocephalic and atraumatic.  Cardiovascular: Normal rate, regular rhythm and normal heart sounds.   Pulmonary/Chest: Effort normal and breath sounds normal.  Musculoskeletal:  Right shoulder- decreased ROM due to pain abduction to 130 degrees. Pain over anterior shoulder to palpation where bicep tendon inserts. Pain over posterior shoulder over coracoid process. Strength of right arm decreased 4/5. Pain with external ROM and flexion. Hand grip 4/5. No signifcant muscle atrophy.    Skin: Skin is dry.  Psychiatric: She has a normal mood and affect. Her behavior is normal.          Assessment & Plan:  Right shoulder bursitis/frozen shoulder- pt is on chronic pain medication for control. Injection given today. Continue to use voltaren. Gave stretches. Discussed PT. Pt continues to decline today. I really think this would help with pain and ROM.  Shoulder Injection Procedure Note  Pre-operative Diagnosis: right bursitis/frozen shoulder  Post-operative Diagnosis: same  Indications: pain and loss of ROM  Anesthesia:   Procedure Details   Verbal consent was obtained for the procedure. The shoulder was prepped with iodine and the skin was anesthetized. Using a 22 gauge needle the glenohumeral joint is injected with 9 mL 1% lidocaine and 1 mL of depo medrol 40mg  under the posterior aspect of the acromion. The injection site was cleansed with topical isopropyl alcohol and a dressing was applied.  Complications:  None; patient tolerated the procedure well.    Prevnar immunization given today without any complications.

## 2013-09-23 DIAGNOSIS — M25512 Pain in left shoulder: Secondary | ICD-10-CM | POA: Insufficient documentation

## 2013-10-05 ENCOUNTER — Telehealth: Payer: Self-pay

## 2013-10-05 NOTE — Telephone Encounter (Signed)
[name] wants a increase of her Xanax from 30 tablets every 30 days to 45 tablets every 30 days. She is having more anxiety and this is causing neck pain.

## 2013-10-08 NOTE — Telephone Encounter (Signed)
Ok for increase to 45.

## 2013-10-09 ENCOUNTER — Other Ambulatory Visit: Payer: Self-pay | Admitting: Family Medicine

## 2013-10-09 DIAGNOSIS — Z1231 Encounter for screening mammogram for malignant neoplasm of breast: Secondary | ICD-10-CM

## 2013-10-09 MED ORDER — ALPRAZOLAM 1 MG PO TABS: 1.0000 mg | ORAL_TABLET | Freq: Every day | ORAL | Status: DC | PRN

## 2013-10-09 NOTE — Telephone Encounter (Signed)
Pt informed of the increase to 45 tabs every 30 days.  Meyer Cory, LPN

## 2013-10-18 ENCOUNTER — Ambulatory Visit (INDEPENDENT_AMBULATORY_CARE_PROVIDER_SITE_OTHER): Payer: Medicare Other

## 2013-10-18 DIAGNOSIS — Z1231 Encounter for screening mammogram for malignant neoplasm of breast: Secondary | ICD-10-CM

## 2013-10-21 ENCOUNTER — Other Ambulatory Visit: Payer: Self-pay | Admitting: Physician Assistant

## 2013-11-08 ENCOUNTER — Other Ambulatory Visit: Payer: Self-pay | Admitting: Family Medicine

## 2013-11-08 ENCOUNTER — Other Ambulatory Visit: Payer: Self-pay

## 2013-11-08 MED ORDER — ALPRAZOLAM 1 MG PO TABS: 1.0000 mg | ORAL_TABLET | Freq: Every day | ORAL | Status: DC | PRN

## 2013-11-08 MED ORDER — ALPRAZOLAM 1 MG PO TABS
1.0000 mg | ORAL_TABLET | Freq: Every day | ORAL | Status: DC | PRN
Start: 2013-11-08 — End: 2014-01-06

## 2013-11-24 ENCOUNTER — Emergency Department
Admission: EM | Admit: 2013-11-24 | Discharge: 2013-11-24 | Disposition: A | Discharge status: Home / Self Care | Payer: Medicare Other | Source: Home / Self Care | Attending: Family Medicine | Admitting: Family Medicine

## 2013-11-24 ENCOUNTER — Encounter: Payer: Self-pay | Admitting: Emergency Medicine

## 2013-11-24 DIAGNOSIS — J069 Acute upper respiratory infection, unspecified: Secondary | ICD-10-CM

## 2013-11-24 DIAGNOSIS — R21 Rash and other nonspecific skin eruption: Secondary | ICD-10-CM

## 2013-11-24 DIAGNOSIS — J449 Chronic obstructive pulmonary disease, unspecified: Secondary | ICD-10-CM

## 2013-11-24 LAB — POCT RAPID STREP A (OFFICE): Rapid Strep A Screen: NEGATIVE

## 2013-11-24 MED ORDER — AZITHROMYCIN 250 MG PO TABS: ORAL_TABLET | ORAL | Status: DC

## 2013-11-24 MED ORDER — METHYLPREDNISOLONE SODIUM SUCC 125 MG IJ SOLR
125.0000 mg | Freq: Once | INTRAMUSCULAR | Status: AC
  Administered 2013-11-24: 125 mg via INTRAMUSCULAR

## 2013-11-24 MED ORDER — PREDNISONE 50 MG PO TABS: ORAL_TABLET | ORAL | Status: DC

## 2013-11-24 NOTE — ED Notes (Signed)
Patient reports 5 day history of sore throat without fever; today noticed fine raised rash on inner aspect of both arms, but states this seems to be an annual occurrence. Also expresses concern about back and leg pain management: was seen by her neurologist 11/16/13 but did not feel she was heard.

## 2013-11-24 NOTE — ED Provider Notes (Signed)
CSN: [id number]     Arrival date & time 11/24/13  1111 History   First MD Initiated Contact with Patient 11/24/13 1112     Chief Complaint  Patient presents with  . Rash  . Back Pain  . Sore Throat    HPI  URI Symptoms Onset: 5 days  Description: rhinorrhea, sinus drainage, cough, sore throat, wheezing  Modifying factors:  1PPD smoker, COPD   Symptoms Nasal discharge: yes Fever: no Sore throat: yes Cough: yes Wheezing: yes Ear pain: no GI symptoms: no Sick contacts: no  Red Flags  Stiff neck: no Dyspnea: no Rash: yes- has faint rash on flexor surfaces of arms that pt states comes annually around this time of year. Nonprutic, though pt is actively scratching areas. No hx/o eczema. Has used steroids in the past for resolution of rash.  Swallowing difficulty: no  Sinusitis Risk Factors Headache/face pain: no Double sickening: no tooth pain: no  Allergy Risk Factors Sneezing: no Itchy scratchy throat: no Seasonal symptoms: no  Flu Risk Factors Headache: no muscle aches: no severe fatigue: no   Past Medical History  Diagnosis Date  . Osteoarthritis   . Cervicalgia   . Chronic pain   . Narcotic abuse     history  . Diabetes mellitus   . Thyroid disease     hypothyroid  . Hyperlipidemia   . COPD (chronic obstructive pulmonary disease)   . Osteoporosis   . Fatty liver   . DDD (degenerative disc disease), lumbar    Past Surgical History  Procedure Laterality Date  . Cholecystectomy    . Breast surgery      braest lump/ benign  . Total knee arthroplasty  2010    right  . Lumbar laminectomy  05/2010    Dr. Netta Corrigan   Family History  Problem Relation Age of Onset  . Heart disease Mother 18    MI  . Hyperlipidemia Mother   . Heart disease Father 10    MI  . Diabetes Father   . Cancer Maternal Grandmother     hodgkins lympoma  . Cirrhosis Sister     primary biliary  . Rheum arthritis Mother   . Rheum arthritis Father    History  Substance Use  Topics  . Smoking status: Current Every Day Smoker -- 1.00 packs/day for 43 years    Types: Cigarettes  . Smokeless tobacco: Never Used  . Alcohol Use: 2.5 oz/week    5 drink(s) per week   OB History   Grav Para Term Preterm Abortions TAB SAB Ect Mult Living                 Review of Systems  All other systems reviewed and are negative.   Allergies  Budesonide-formoterol fumarate; Codeine; Fentanyl; Fluticasone-salmeterol; Tramadol-acetaminophen; and Naproxen  Home Medications   Prior to Admission medications   Medication Sig Start Date End Date Taking? Authorizing Yakir Wenke  alendronate (FOSAMAX) 70 MG tablet Take 1 tablet (70 mg total) by mouth every 7 (seven) days. Take with a full glass of water on an empty stomach. 07/10/13   Jade L Breeback, PA-C  ALPRAZolam (XANAX) 1 MG tablet Take 1-1.5 tablets (1-1.5 mg total) by mouth daily as needed for anxiety. New rx sent to correct sig on Rx sent on 11/07/13 11/08/13   Agapito Games, MD  citalopram (CELEXA) 20 MG tablet Take 1 tablet (20 mg total) by mouth daily. 08/10/13   Jade L Breeback, PA-C  diclofenac (VOLTAREN) 75  MG EC tablet Take 1 tablet (75 mg total) by mouth 2 (two) times daily. As needed for joint pain. 08/11/12   Jade L Breeback, PA-C  fluticasone (FLONASE) 50 MCG/ACT nasal spray USE TWO SPRAYS IN EACH NOSTRIL DAILY  03/09/13   Jade L Breeback, PA-C  gabapentin (NEURONTIN) 600 MG tablet TAKE ONE TABLET BY MOUTH 3 TIMES A DAY 06/25/13   Jade L Breeback, PA-C  levothyroxine (SYNTHROID, LEVOTHROID) 150 MCG tablet TAKE ONE TABLET BY MOUTH ONE TIME DAILY     Jade L Breeback, PA-C  mupirocin (BACTROBAN) 2 % ointment Apply topically 2 (two) times daily.      Historical Jolonda Gomm, MD  NEXIUM 40 MG capsule TAKE ONE CAPSULE BY MOUTH EVERY DAY BEFORE BREAKFAST 10/08/12   Agapito Games, MD  Oxycodone HCl 20 MG TABS Take 10 mg by mouth 2 (two) times daily. 08/09/12   Jade L Breeback, PA-C  OxyCODONE HCl ER (OXYCONTIN) 30 MG T12A  Take 40 mg by mouth 3 (three) times daily.     Historical Toneka Fullen, MD   BP 170/73  Pulse 79  Temp(Src) 98.4 F (36.9 C) (Oral)  Resp 16  Ht 5\' 3"  (1.6 m)  Wt 132 lb (59.875 kg)  BMI 23.39 kg/m2  SpO2 95% Physical Exam  Constitutional: She appears well-developed and well-nourished.  HENT:  Head: Normocephalic and atraumatic.  Right Ear: External ear normal.  Left Ear: External ear normal.  +nasal erythema, rhinorrhea bilaterally, + post oropharyngeal erythema    Eyes: Conjunctivae are normal. Pupils are equal, round, and reactive to light.  Neck: Normal range of motion. Neck supple.  Cardiovascular: Normal rate and regular rhythm.   Pulmonary/Chest: Effort normal. She has wheezes.  Abdominal: Soft.  Musculoskeletal: Normal range of motion.  Lymphadenopathy:    She has no cervical adenopathy.  Neurological: She is alert.  Skin:  Faint scaling rash on flexor surfaces of arms bilaterally      ED Course  Procedures (including critical care time) Labs Review Labs Reviewed  POCT RAPID STREP A (OFFICE)    Imaging Review No results found.   MDM   1. URI (upper respiratory infection)   2. COPD (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   3. Rash and nonspecific skin eruption    Sxs consistent with mild/subacute COPD exacerbation.  Solumedrol 125 gm IMx1 Will place on prednisone and zpak. Suspect rash is secondary/incidental finding. Distribution fits picture of atopic disease in active smoker. Has responded to steroids in the past. Rapid strep negative today. Will culture.  Discussed infectious, derm and resp red flags.  Quit smoking! Follow up with PCP next week to discuss chronic pain/?fibromyalgia     The patient and/or caregiver has been counseled thoroughly with regard to treatment plan and/or medications prescribed including dosage, schedule, interactions, rationale for use, and possible side effects and they verbalize understanding.  Diagnoses and expected course of recovery discussed and will return if not improved as expected or if the condition worsens. Patient and/or caregiver verbalized understanding.         , MD 11/24/13 1239

## 2013-11-25 LAB — STREP A DNA PROBE: GASP: NEGATIVE

## 2013-11-26 ENCOUNTER — Telehealth: Payer: Self-pay

## 2013-11-26 NOTE — ED Notes (Signed)
Left a message on voice mail asking how patient is feeling and advising to call back with any questions or concerns. Also, left message stating throat culture was negative.

## 2013-12-05 ENCOUNTER — Encounter: Payer: Self-pay | Admitting: Physician Assistant

## 2013-12-05 ENCOUNTER — Ambulatory Visit (INDEPENDENT_AMBULATORY_CARE_PROVIDER_SITE_OTHER): Payer: Medicare Other | Admitting: Physician Assistant

## 2013-12-05 VITALS — BP 131/72 | HR 86 | Ht 63.0 in | Wt 135.0 lb

## 2013-12-05 DIAGNOSIS — G894 Chronic pain syndrome: Secondary | ICD-10-CM

## 2013-12-05 DIAGNOSIS — J441 Chronic obstructive pulmonary disease with (acute) exacerbation: Secondary | ICD-10-CM

## 2013-12-05 DIAGNOSIS — J42 Unspecified chronic bronchitis: Secondary | ICD-10-CM

## 2013-12-05 MED ORDER — IPRATROPIUM-ALBUTEROL 0.5-2.5 (3) MG/3ML IN SOLN
3.0000 mL | Freq: Once | RESPIRATORY_TRACT | Status: AC
  Administered 2013-12-05: 3 mL via RESPIRATORY_TRACT

## 2013-12-05 MED ORDER — PREGABALIN 75 MG PO CAPS: 75.0000 mg | ORAL_CAPSULE | Freq: Two times a day (BID) | ORAL | Status: DC

## 2013-12-05 MED ORDER — ALBUTEROL SULFATE (2.5 MG/3ML) 0.083% IN NEBU: 2.5000 mg | INHALATION_SOLUTION | Freq: Four times a day (QID) | RESPIRATORY_TRACT | Status: DC | PRN

## 2013-12-05 MED ORDER — TIOTROPIUM BROMIDE MONOHYDRATE 18 MCG IN CAPS: 18.0000 ug | ORAL_CAPSULE | Freq: Every day | RESPIRATORY_TRACT | Status: DC

## 2013-12-05 NOTE — Patient Instructions (Addendum)
Will start spiriva daily.

## 2013-12-05 NOTE — Progress Notes (Signed)
   Subjective:    Patient ID: [name], female    DOB: 09-Oct-1950, 63 y.o.   MRN: [id number]  HPI Pt is a 63 yo female who presents to the clinic with ongoing worsening pain. She currently sees [facility] and on oxycotin and oxycodone daily but she still has pain all over. She was researching and thinks she has fibromyaglia. She wants to be tested. She hurts everywhere but more over pressure points described in fibromyalgia.   Recently went to [facility] for COPD exacerbation given steroid and zpak. Pt feels better from last visit but overall feels like she is not able to do the same things she used to without getting SOB. She has no daily inhalers or rescue inhalers. Continues to smoke.     Review of Systems  All other systems reviewed and are negative.      Objective:   Physical Exam  Constitutional: She is oriented to person, place, and time.  HENT:  Head: Normocephalic and atraumatic.  Right Ear: External ear normal.  Left Ear: External ear normal.  Nose: Nose normal.  Mouth/Throat: Oropharynx is clear and moist.  Eyes: Conjunctivae are normal. Right eye exhibits no discharge. Left eye exhibits no discharge.  Neck: Normal range of motion. Neck supple.  Cardiovascular: Normal rate, regular rhythm and normal heart sounds.   Pulmonary/Chest:  Coarse breath sounds. Wheezing heard at base of right lung.   Lymphadenopathy:    She has no cervical adenopathy.  Neurological: She is alert and oriented to person, place, and time.  Skin: Skin is dry.  Psychiatric: She has a normal mood and affect. Her behavior is normal.          Assessment & Plan:  Chr0nic pain- discussed with patient no test for fibromyalgia but dx of exclusion. Her symptoms are consistent with some fibromyalgia pain; however, we are treating you with same drugs that treat fibromyalgia. Encouraged pt to stay active and moving as much as she could. Pt could not tolerate gabapentin due to sedation. We are  going to try lyrica 75mg  bid to start. Cost is always an issue. Would love to try something beside celexa for mood and overall pain but cannot afford fetizema because coupon card does not work. cymbalta was tried but did not help via pt. Follow up in 4 weeks.    COPD- recently had exacerbation that was treated. . duoneb given in office today. Some relieft with nebulizer. Sent home with nebulizer solution since already has neb machine and doesn't want to pay for inhaler for rescue. Started on spirvia daily. Explained how to use. Encouraged smoking cessation. Pt declin Follow up in 4 weeks.

## 2013-12-06 DIAGNOSIS — J449 Chronic obstructive pulmonary disease, unspecified: Secondary | ICD-10-CM | POA: Insufficient documentation

## 2013-12-17 ENCOUNTER — Other Ambulatory Visit: Payer: Self-pay | Admitting: Physician Assistant

## 2014-01-01 ENCOUNTER — Encounter: Payer: Self-pay | Admitting: Family Medicine

## 2014-01-01 ENCOUNTER — Ambulatory Visit (INDEPENDENT_AMBULATORY_CARE_PROVIDER_SITE_OTHER): Payer: Medicare Other | Admitting: Family Medicine

## 2014-01-01 VITALS — BP 106/62 | HR 83 | Temp 98.6°F | Wt 134.0 lb

## 2014-01-01 DIAGNOSIS — J449 Chronic obstructive pulmonary disease, unspecified: Secondary | ICD-10-CM

## 2014-01-01 DIAGNOSIS — J441 Chronic obstructive pulmonary disease with (acute) exacerbation: Secondary | ICD-10-CM

## 2014-01-01 MED ORDER — HYDROCOD POLST-CHLORPHEN POLST 10-8 MG/5ML PO LQCR: 5.0000 mL | Freq: Two times a day (BID) | ORAL | Status: DC | PRN

## 2014-01-01 MED ORDER — DOXYCYCLINE HYCLATE 100 MG PO TABS: ORAL_TABLET | ORAL | Status: AC

## 2014-01-01 MED ORDER — PREDNISONE 20 MG PO TABS: ORAL_TABLET | ORAL | Status: AC

## 2014-01-01 NOTE — Progress Notes (Signed)
CC: [name] is a 63 y.o. female is here for chest congestion   Subjective: HPI:  Complains of nonproductive cough that has been worsening since Thursday. Present all hours of the day to a moderate degree but much worse in the evening when trying to fall sleep or lying down. Severely interfering with her ability to sleep. Reports occasional wheezing however this improves when taking Spiriva or albuterol. Nothing else seems to make overall symptoms better or worse. Mild shortness of breath. Denies fevers, chills, blood in sputum, chest pain, back pain, confusion, lightheadedness, nasal congestion, facial pressure. Other interventions have included nasal steroid and saline washes   Review Of Systems Outlined In HPI  Past Medical History  Diagnosis Date  . Osteoarthritis   . Cervicalgia   . Chronic pain   . Narcotic abuse     history  . Diabetes mellitus   . Thyroid disease     hypothyroid  . Hyperlipidemia   . COPD (chronic obstructive pulmonary disease)   . Osteoporosis   . Fatty liver   . DDD (degenerative disc disease), lumbar     Past Surgical History  Procedure Laterality Date  . Cholecystectomy    . Breast surgery      braest lump/ benign  . Total knee arthroplasty  2010    right  . Lumbar laminectomy  05/2010    Dr. Netta Corrigan   Family History  Problem Relation Age of Onset  . Heart disease Mother 50    MI  . Hyperlipidemia Mother   . Heart disease Father 54    MI  . Diabetes Father   . Cancer Maternal Grandmother     hodgkins lympoma  . Cirrhosis Sister     primary biliary  . Rheum arthritis Mother   . Rheum arthritis Father     History   Social History  . Marital Status: Married    Spouse Name: N/A    Number of Children: N/A  . Years of Education: N/A   Occupational History  . Not on file.   Social History Main Topics  . Smoking status: Current Every Day Smoker -- 1.00 packs/day for 43 years    Types: Cigarettes  . Smokeless tobacco: Never  Used  . Alcohol Use: 2.5 oz/week    5 drink(s) per week  . Drug Use: No  . Sexual Activity: Not on file   Other Topics Concern  . Not on file   Social History Narrative  . No narrative on file     Objective: BP 106/62  Pulse 83  Temp(Src) 98.6 F (37 C) (Oral)  Wt 134 lb (60.782 kg)  SpO2 98%  General: Alert and Oriented, No Acute Distress HEENT: Pupils equal, round, reactive to light. Conjunctivae clear.  External ears unremarkable, canals clear with intact TMs with appropriate landmarks.  Middle ear appears open without effusion. Pink inferior turbinates.  Moist mucous membranes, pharynx without inflammation nor lesions.  Neck supple without palpable lymphadenopathy nor abnormal masses. Lungs: Mildly diminished breath sounds with no rhonchi or rales nor wheezing. Frequent coughing Cardiac: Regular rate and rhythm. Normal S1/S2.  No murmurs, rubs, nor gallops.   Extremities: No peripheral edema.  Strong peripheral pulses.  Mental Status: No depression, anxiety, nor agitation. Skin: Warm and dry.  Assessment & Plan: [name] was seen today for chest congestion.  Diagnoses and associated orders for this visit:  COPD  COPD exacerbation - chlorpheniramine-HYDROcodone (TUSSIONEX PENNKINETIC ER) 10-8 MG/5ML LQCR; Take 5 mLs by mouth  every 12 (twelve) hours as needed for cough. Take only if not violating pain contract. - predniSONE (DELTASONE) 20 MG tablet; Three tabs at once daily for five days. - doxycycline (VIBRA-TABS) 100 MG tablet; One by mouth twice a day for ten days.    COPD exacerbation: Start prednisone and doxycycline. She is requesting medication to help with nocturnal symptoms therefore start Tussionex only if it does not violate any pre-existing pain management narcotic contract. Albuterol nebulizer or inhaler every 4-6 hours for the next 48 hours.  Return if symptoms worsen or fail to improve.

## 2014-01-02 ENCOUNTER — Ambulatory Visit: Payer: Medicare Other | Admitting: Physician Assistant

## 2014-01-06 ENCOUNTER — Other Ambulatory Visit: Payer: Self-pay | Admitting: Physician Assistant

## 2014-01-22 ENCOUNTER — Other Ambulatory Visit: Payer: Self-pay | Admitting: Physician Assistant

## 2014-01-24 ENCOUNTER — Telehealth: Payer: Self-pay

## 2014-01-24 NOTE — Telephone Encounter (Signed)
[name] reports constipation for 1 week. She has taken Dulcolax 200 mg's daily for the last 3 days and increase her fluid intake along with increase in her fiber intake with no relief. Denies abdominal pain, nausea or vomiting. Please advise.

## 2014-01-24 NOTE — Telephone Encounter (Signed)
Ok to add a laxative.  Can try either Ex-Lax or miralax to push through the gut.  Can use BID until has a soft BM

## 2014-01-24 NOTE — Telephone Encounter (Signed)
Patient advised of recommendations.  

## 2014-01-28 ENCOUNTER — Ambulatory Visit (INDEPENDENT_AMBULATORY_CARE_PROVIDER_SITE_OTHER): Payer: Medicare Other | Admitting: Sports Medicine

## 2014-01-28 ENCOUNTER — Encounter: Payer: Self-pay | Admitting: Sports Medicine

## 2014-01-28 VITALS — BP 129/64 | HR 88 | Ht 63.0 in | Wt 136.0 lb

## 2014-01-28 DIAGNOSIS — M76899 Other specified enthesopathies of unspecified lower limb, excluding foot: Secondary | ICD-10-CM

## 2014-01-28 DIAGNOSIS — M7061 Trochanteric bursitis, right hip: Secondary | ICD-10-CM

## 2014-01-28 NOTE — Progress Notes (Signed)
  Subjective:    CC: Trochanteric bursitis  HPI: This is a very pleasant 63 year old female, last injected her right trochanteric bursa in December of 2014. She did well, but unfortunately has a recurrence of pain. It is moderate, persistent, no radiation.  Past medical history, Surgical history, Family history not pertinant except as noted below, Social history, Allergies, and medications have been entered into the medical record, reviewed, and no changes needed.   Review of Systems: No fevers, chills, night sweats, weight loss, chest pain, or shortness of breath.   Objective:    General: Well Developed, well nourished, and in no acute distress.  Neuro: Alert and oriented x3, extra-ocular muscles intact, sensation grossly intact.  HEENT: Normocephalic, atraumatic, pupils equal round reactive to light, neck supple, no masses, no lymphadenopathy, thyroid nonpalpable.  Skin: Warm and dry, no rashes. Cardiac: Regular rate and rhythm, no murmurs rubs or gallops, no lower extremity edema.  Respiratory: Clear to auscultation bilaterally. Not using accessory muscles, speaking in full sentences. Right Hip: ROM IR: 60 Deg, ER: 60 Deg, Flexion: 120 Deg, Extension: 100 Deg, Abduction: 45 Deg, Adduction: 45 Deg Strength IR: 5/5, ER: 5/5, Flexion: 5/5, Extension: 5/5, Abduction: 4/5, Adduction: 5/5 Pelvic alignment unremarkable to inspection and palpation. Standing hip rotation and gait without trendelenburg / unsteadiness. Greater trochanter with tenderness to palpation. No tenderness over piriformis. No SI joint tenderness and normal minimal SI movement.  Procedure:  Injection of right trochanteric bursa Consent obtained and verified. Time-out conducted. Noted no overlying erythema, induration, or other signs of local infection. Skin prepped in a sterile fashion. Topical analgesic spray: Ethyl chloride. Completed without difficulty. Meds: Spinal needle advanced to the greater trochanter, 1  cc Kenalog 40, 4 cc lidocaine injected easily in a fanlike pattern. Pain immediately improved suggesting accurate placement of the medication. Advised to call if fevers/chills, erythema, induration, drainage, or persistent bleeding.  Impression and Recommendations:

## 2014-01-28 NOTE — Assessment & Plan Note (Signed)
Recurrent trochanteric bursitis. Last injection was December of 2014. Repeat injection today. She did have significant hip abductor weakness so we will proceed again with formal physical therapy. Return in 2 months.

## 2014-02-06 ENCOUNTER — Other Ambulatory Visit: Payer: Self-pay | Admitting: Physician Assistant

## 2014-02-12 ENCOUNTER — Ambulatory Visit: Payer: Medicare Other | Admitting: Physical Therapy

## 2014-02-14 ENCOUNTER — Other Ambulatory Visit: Payer: Self-pay | Admitting: Physical Medicine and Rehabilitation

## 2014-02-14 DIAGNOSIS — M5416 Radiculopathy, lumbar region: Secondary | ICD-10-CM

## 2014-02-24 ENCOUNTER — Ambulatory Visit
Admission: RE | Admit: 2014-02-24 | Discharge: 2014-02-24 | Disposition: A | Discharge status: Home / Self Care | Payer: Medicare Other | Source: Ambulatory Visit | Attending: Physical Medicine and Rehabilitation | Admitting: Physical Medicine and Rehabilitation

## 2014-02-24 DIAGNOSIS — M5416 Radiculopathy, lumbar region: Secondary | ICD-10-CM

## 2014-02-24 MED ORDER — GADOBENATE DIMEGLUMINE 529 MG/ML IV SOLN
12.0000 mL | Freq: Once | INTRAVENOUS | Status: AC | PRN
  Administered 2014-02-24: 12 mL via INTRAVENOUS

## 2014-03-07 ENCOUNTER — Other Ambulatory Visit: Payer: Self-pay | Admitting: Physician Assistant

## 2014-03-16 ENCOUNTER — Other Ambulatory Visit: Payer: Self-pay | Admitting: Physician Assistant

## 2014-03-21 ENCOUNTER — Telehealth: Payer: Self-pay | Admitting: *Deleted

## 2014-03-21 DIAGNOSIS — E8881 Metabolic syndrome: Secondary | ICD-10-CM

## 2014-03-21 DIAGNOSIS — E038 Other specified hypothyroidism: Secondary | ICD-10-CM

## 2014-03-21 NOTE — Telephone Encounter (Signed)
Pt left vm wanting to know if it was time to recheck her TSH and A1c.  Looked at her last labs and she's definitely overdue on the TSH but I didn't see anything in Jade's notes from 07/13/13 (last a1c was 6.0 then) regarding when she wanted to recheck.  Can you take a look and let me know?  Thanks

## 2014-03-21 NOTE — Telephone Encounter (Signed)
Labs ordered & faxed.  Left vm notifying pt.

## 2014-03-21 NOTE — Telephone Encounter (Signed)
OK for both. Can use Impaired fasting glucose for the A1C

## 2014-03-30 LAB — HEMOGLOBIN A1C
Hgb A1c MFr Bld: 6.5 % — ABNORMAL HIGH (ref <5.7)
Mean Plasma Glucose: 140 mg/dL — ABNORMAL HIGH (ref <117)

## 2014-03-30 LAB — TSH: TSH: 47.964 u[IU]/mL — AB (ref 0.350–4.500)

## 2014-04-01 ENCOUNTER — Encounter: Payer: Self-pay | Admitting: Family Medicine

## 2014-04-01 ENCOUNTER — Other Ambulatory Visit: Payer: Self-pay | Admitting: Family Medicine

## 2014-04-01 MED ORDER — METFORMIN HCL 500 MG PO TABS: 500.0000 mg | ORAL_TABLET | Freq: Two times a day (BID) | ORAL | Status: DC

## 2014-04-02 ENCOUNTER — Other Ambulatory Visit: Payer: Self-pay | Admitting: *Deleted

## 2014-04-02 ENCOUNTER — Ambulatory Visit (INDEPENDENT_AMBULATORY_CARE_PROVIDER_SITE_OTHER): Payer: Medicare Other | Admitting: Family Medicine

## 2014-04-02 ENCOUNTER — Other Ambulatory Visit (HOSPITAL_COMMUNITY)
Admission: RE | Admit: 2014-04-02 | Discharge: 2014-04-02 | Disposition: A | Discharge status: Home / Self Care | Payer: Medicare Other | Source: Ambulatory Visit | Attending: Family Medicine | Admitting: Family Medicine

## 2014-04-02 ENCOUNTER — Encounter: Payer: Self-pay | Admitting: Family Medicine

## 2014-04-02 VITALS — BP 122/67 | HR 67 | Temp 98.4°F | Ht 63.0 in | Wt 139.0 lb

## 2014-04-02 DIAGNOSIS — Z23 Encounter for immunization: Secondary | ICD-10-CM

## 2014-04-02 DIAGNOSIS — Z124 Encounter for screening for malignant neoplasm of cervix: Secondary | ICD-10-CM

## 2014-04-02 DIAGNOSIS — N814 Uterovaginal prolapse, unspecified: Secondary | ICD-10-CM

## 2014-04-02 DIAGNOSIS — N8110 Cystocele, unspecified: Secondary | ICD-10-CM

## 2014-04-02 DIAGNOSIS — N952 Postmenopausal atrophic vaginitis: Secondary | ICD-10-CM

## 2014-04-02 DIAGNOSIS — Z01419 Encounter for gynecological examination (general) (routine) without abnormal findings: Secondary | ICD-10-CM | POA: Insufficient documentation

## 2014-04-02 DIAGNOSIS — Z1151 Encounter for screening for human papillomavirus (HPV): Secondary | ICD-10-CM | POA: Diagnosis present

## 2014-04-02 MED ORDER — CITALOPRAM HYDROBROMIDE 20 MG PO TABS: 20.0000 mg | ORAL_TABLET | Freq: Every day | ORAL | Status: DC

## 2014-04-02 MED ORDER — METFORMIN HCL 500 MG PO TABS: 500.0000 mg | ORAL_TABLET | Freq: Two times a day (BID) | ORAL | Status: DC

## 2014-04-02 NOTE — Addendum Note (Signed)
Addended by: Deno Etienne on: 04/02/2014 04:44 PM   Modules accepted: Orders

## 2014-04-02 NOTE — Progress Notes (Signed)
   Subjective:    Patient ID: [name], female    DOB: 01/14/1951, 63 y.o.   MRN: [id number]  HPI On Saturday says she couldn't stop urinating.  Says went through 4 pads.  She decreased her fluid intake and that did help. No hematuria or dysuria. No fever, chills or sweats. She is here today because she is concerned that her bladdermay be"close to the surface". She says her sister had some type of bladder tack surgery performed.   Review of Systems     Objective:   Physical Exam  Constitutional: She is oriented to person, place, and time. She appears well-developed and well-nourished.  HENT:  Head: Normocephalic and atraumatic.  Genitourinary: Uterus normal. There is no rash on the right labia. There is rash on the left labia. Cervix exhibits no motion tenderness, no discharge and no friability. Right adnexum displays no mass. Left adnexum displays no mass.  Atrophy at the introitus. She does have some bladder prolapse. She does have a little bit of uterine prolapse but it is not protruding as well. Cervix appears normal. Pap smear specimen obtained. Vaginal canal otherwise appears normal.  Neurological: She is alert and oriented to person, place, and time.  Skin: Skin is warm and dry.  Psychiatric: She has a normal mood and affect.          Assessment & Plan:  Bladder prolapse - recommend referral to GYN for further evaluation and discuss treatment options. She says she's not really interested in surgery at this point.likely contributing to her incontinence as well.  Uterine prolapse-believe is mild.  Vaginal atrophy-she's not currently expensing pain or problems.  Flu shot given today.

## 2014-04-04 ENCOUNTER — Telehealth: Payer: Self-pay

## 2014-04-04 LAB — CYTOLOGY - PAP

## 2014-04-04 NOTE — Telephone Encounter (Signed)
Called patient to schedule appointment for referral from Dr. Linford Arnold office. Patient states she does not need at this time and will call if problem occur.

## 2014-04-05 ENCOUNTER — Other Ambulatory Visit: Payer: Self-pay | Admitting: Family Medicine

## 2014-04-07 NOTE — Progress Notes (Signed)
Quick Note:  Call patient: Your Pap smear is normal. Repeat in 3 years. ______ 

## 2014-04-07 NOTE — Progress Notes (Signed)
Quick Note:  All labs are normal. ______ 

## 2014-04-08 ENCOUNTER — Other Ambulatory Visit: Payer: Self-pay | Admitting: Family Medicine

## 2014-04-10 ENCOUNTER — Other Ambulatory Visit: Payer: Self-pay | Admitting: Family Medicine

## 2014-04-11 ENCOUNTER — Other Ambulatory Visit: Payer: Self-pay | Admitting: *Deleted

## 2014-04-11 ENCOUNTER — Other Ambulatory Visit: Payer: Self-pay | Admitting: Family Medicine

## 2014-04-11 MED ORDER — ALPRAZOLAM 1 MG PO TABS: ORAL_TABLET | ORAL | Status: DC

## 2014-05-01 ENCOUNTER — Other Ambulatory Visit: Payer: Self-pay | Admitting: Physician Assistant

## 2014-05-27 ENCOUNTER — Telehealth: Payer: Self-pay | Admitting: *Deleted

## 2014-05-27 DIAGNOSIS — E039 Hypothyroidism, unspecified: Secondary | ICD-10-CM

## 2014-05-27 NOTE — Telephone Encounter (Signed)
TSH ordered for 6 week recheck.

## 2014-06-03 ENCOUNTER — Encounter: Payer: Self-pay | Admitting: Physician Assistant

## 2014-06-03 ENCOUNTER — Ambulatory Visit (INDEPENDENT_AMBULATORY_CARE_PROVIDER_SITE_OTHER): Payer: Medicare Other | Admitting: Physician Assistant

## 2014-06-03 VITALS — BP 129/72 | HR 80 | Ht 63.0 in | Wt 133.0 lb

## 2014-06-03 DIAGNOSIS — F411 Generalized anxiety disorder: Secondary | ICD-10-CM

## 2014-06-03 DIAGNOSIS — F32A Depression, unspecified: Secondary | ICD-10-CM

## 2014-06-03 DIAGNOSIS — F329 Major depressive disorder, single episode, unspecified: Secondary | ICD-10-CM

## 2014-06-03 DIAGNOSIS — E119 Type 2 diabetes mellitus without complications: Secondary | ICD-10-CM

## 2014-06-03 MED ORDER — ALPRAZOLAM 1 MG PO TABS: ORAL_TABLET | ORAL | Status: DC

## 2014-06-03 MED ORDER — VENLAFAXINE HCL ER 37.5 MG PO CP24: 37.5000 mg | ORAL_CAPSULE | Freq: Every day | ORAL | Status: DC

## 2014-06-03 MED ORDER — OMEPRAZOLE 40 MG PO CPDR: 40.0000 mg | DELAYED_RELEASE_CAPSULE | Freq: Every day | ORAL | Status: DC

## 2014-06-03 NOTE — Patient Instructions (Signed)
Take 1/2 celexa for 7 days and one tablet of effexor. Then stop celexa and start 2 tablets of effexor.

## 2014-06-04 ENCOUNTER — Other Ambulatory Visit: Payer: Self-pay | Admitting: Physician Assistant

## 2014-06-04 LAB — TSH: TSH: 0.116 u[IU]/mL — AB (ref 0.350–4.500)

## 2014-06-04 MED ORDER — LEVOTHYROXINE SODIUM 125 MCG PO TABS: 125.0000 ug | ORAL_TABLET | Freq: Every day | ORAL | Status: DC

## 2014-06-04 NOTE — Progress Notes (Signed)
   Subjective:    Patient ID: [name], female    DOB: Nov 20, 1950, 64 y.o.   MRN: [id number]  HPI Pt presents to the clinic to discuss anxiety and depression. She has ongoing symptoms for awhile. She does not feel like celexa is working and would like to try something else. No suicidal or homicidal thoughts. Her husband lost another job and they literally have no money. He has them in 10,000 plus dollars debt.   She started back on metformin. She is not checking sugars. Denies any hypoglycemic events.    Review of Systems  All other systems reviewed and are negative.      Objective:   Physical Exam  Constitutional: She is oriented to person, place, and time. She appears well-developed and well-nourished.  HENT:  Head: Normocephalic and atraumatic.  Cardiovascular: Normal rate, regular rhythm and normal heart sounds.   Pulmonary/Chest: Effort normal and breath sounds normal. She has no wheezes.  Neurological: She is alert and oriented to person, place, and time.  Skin: Skin is dry.  Psychiatric: She has a normal mood and affect. Her behavior is normal.          Assessment & Plan:  Depression/anxiety- GAD-7 was 20. PHQ-9 was 21. Cut celexa in half for 7 days start effexor one tablet. Then stop celexa and start 2 tablets of effexor. Follow up in 4-6 weeks. Discussed side effects. Refilled xanax as well.    DM, type II- .[name] Lab Results  Component Value Date   HGBA1C 6.5* 03/29/2014   Too soon to get another a1c. Follow up in one month. Continue on metformin, diet and exercise.

## 2014-06-05 ENCOUNTER — Telehealth: Payer: Self-pay | Admitting: *Deleted

## 2014-06-05 NOTE — Telephone Encounter (Signed)
Prior auth initiated via cover my meds.

## 2014-06-07 ENCOUNTER — Telehealth: Payer: Self-pay | Admitting: *Deleted

## 2014-06-07 NOTE — Telephone Encounter (Signed)
The problem is that celexa at that dose interacts with one of her medications. We should be able to get PA effexor is generic.

## 2014-06-07 NOTE — Telephone Encounter (Signed)
Pt left a vm wanting to know that since the Effexor requires a PA, if she could try 40mg  of celexa instead.  Please advise.

## 2014-06-10 ENCOUNTER — Other Ambulatory Visit: Payer: Self-pay | Admitting: *Deleted

## 2014-06-10 MED ORDER — VENLAFAXINE HCL 75 MG PO TABS: 75.0000 mg | ORAL_TABLET | Freq: Every day | ORAL | Status: DC

## 2014-06-11 NOTE — Telephone Encounter (Signed)
Insurance called and they would pay for 75 mg once a day and the new script was changed and sent to pharmacy. This change was confirmed by Lesly Rubenstein before being done and sent to pharmacy.

## 2014-06-11 NOTE — Telephone Encounter (Signed)
Effexor approved & pt notified.

## 2014-07-04 ENCOUNTER — Other Ambulatory Visit: Payer: Self-pay | Admitting: Physician Assistant

## 2014-07-05 ENCOUNTER — Telehealth: Payer: Self-pay | Admitting: *Deleted

## 2014-07-05 NOTE — Telephone Encounter (Signed)
What about wellbutrin with celexa have you ever tried this?

## 2014-07-05 NOTE — Telephone Encounter (Signed)
She can if she would like but it was not working as well. Is there anything else insurance will approve for you?

## 2014-07-05 NOTE — Telephone Encounter (Signed)
Pt left vm stating that she still feels like her thyroid is off.  She's due to have her levels checked in 2 weeks.  She also wanted to know if she could go back on the citalopram..she doesn't feel like the effexor is working for her.  Please advise.

## 2014-07-08 NOTE — Telephone Encounter (Signed)
Can we check and see if patient has tried buspar, vistaril ? Those are only other options with celexa that could help unless we try branded medication but that can increase price. Would you like psych referral.

## 2014-07-08 NOTE — Telephone Encounter (Signed)
Pt has both on her historical med list.

## 2014-07-15 ENCOUNTER — Encounter: Payer: Medicare Other | Admitting: Physician Assistant

## 2014-07-22 ENCOUNTER — Other Ambulatory Visit: Payer: Self-pay | Admitting: Physician Assistant

## 2014-07-22 ENCOUNTER — Encounter: Payer: Self-pay | Admitting: Physician Assistant

## 2014-07-22 ENCOUNTER — Ambulatory Visit (INDEPENDENT_AMBULATORY_CARE_PROVIDER_SITE_OTHER): Payer: Medicare Other | Admitting: Physician Assistant

## 2014-07-22 VITALS — BP 137/77 | HR 87 | Temp 98.1°F | Resp 16 | Ht 63.0 in | Wt 133.0 lb

## 2014-07-22 DIAGNOSIS — E039 Hypothyroidism, unspecified: Secondary | ICD-10-CM

## 2014-07-22 DIAGNOSIS — E119 Type 2 diabetes mellitus without complications: Secondary | ICD-10-CM | POA: Diagnosis not present

## 2014-07-22 DIAGNOSIS — Z13820 Encounter for screening for osteoporosis: Secondary | ICD-10-CM

## 2014-07-22 DIAGNOSIS — G8929 Other chronic pain: Secondary | ICD-10-CM

## 2014-07-22 DIAGNOSIS — E785 Hyperlipidemia, unspecified: Secondary | ICD-10-CM

## 2014-07-22 DIAGNOSIS — M79602 Pain in left arm: Secondary | ICD-10-CM

## 2014-07-22 DIAGNOSIS — Z1239 Encounter for other screening for malignant neoplasm of breast: Secondary | ICD-10-CM

## 2014-07-22 DIAGNOSIS — Z0000 Encounter for general adult medical examination without abnormal findings: Secondary | ICD-10-CM

## 2014-07-22 DIAGNOSIS — M79601 Pain in right arm: Secondary | ICD-10-CM

## 2014-07-22 DIAGNOSIS — Z79899 Other long term (current) drug therapy: Secondary | ICD-10-CM

## 2014-07-22 MED ORDER — ALENDRONATE SODIUM 70 MG PO TABS: 70.0000 mg | ORAL_TABLET | ORAL | Status: DC

## 2014-07-22 MED ORDER — TIOTROPIUM BROMIDE MONOHYDRATE 18 MCG IN CAPS: 18.0000 ug | ORAL_CAPSULE | Freq: Every day | RESPIRATORY_TRACT | Status: DC

## 2014-07-22 MED ORDER — BACLOFEN 10 MG PO TABS: 10.0000 mg | ORAL_TABLET | Freq: Two times a day (BID) | ORAL | Status: DC

## 2014-07-22 MED ORDER — ALPRAZOLAM 1 MG PO TABS: ORAL_TABLET | ORAL | Status: DC

## 2014-07-22 NOTE — Patient Instructions (Signed)

## 2014-07-26 NOTE — Progress Notes (Signed)
Subjective:    [name] is a 64 y.o. female who presents for Medicare Annual/Subsequent preventive examination.  Preventive Screening-Counseling & Management  Tobacco History  Smoking status  . Current Every Day Smoker -- 1.00 packs/day for 43 years  . Types: Cigarettes  Smokeless tobacco  . Never Used     1. Chronic pain  2. Diabetes mellitus controlled  3 hypothyroidism  Patient does report new bilateral arm pain. Has been present for 2-3 weeks. Worse over her biceps. Worse right before she goes to bed. Neck is also sore. She did discuss with pain management. Her treatment was done. She is on OxyContin.  Current Problems (verified) Patient Active Problem List   Diagnosis Date Noted  . COLD (chronic obstructive lung disease) 12/06/2013  . Chronic pain syndrome 12/05/2013  . Right shoulder pain 09/23/2013  . Ganglion cyst of wrist 07/13/2013  . Anxiety state 05/16/2013  . Trochanteric bursitis of right hip 05/04/2013  . Diabetes type 2, controlled 03/14/2013  . Hypothyroidism 03/14/2013  . Arthritis of carpometacarpal joint 02/14/2012  . Sacroiliitis 09/14/2011  . Chronic headache 04/03/2011  . Tobacco abuse 04/03/2011  . Chronic pain 04/03/2011  . Hypertension 04/03/2011  . Allergic rhinitis, cause unspecified 10/06/2010  . CHRONIC OBSTRUCTIVE PULMONARY DISEASE, ACUTE EXACERBATION 08/14/2010  . ALCOHOLIC FATTY LIVER 12/24/2009  . ALLERGIC REACTION 12/24/2009  . COPD 12/04/2009  . HYPERCALCEMIA 11/13/2009  . ARTHRITIS, ACROMIOCLAVICULAR 07/31/2008  . SPINAL STENOSIS IN CERVICAL REGION 07/31/2008  . ROTATOR CUFF SYNDROME, RIGHT 04/01/2008  . OSTEOPOROSIS 10/17/2007  . DYSLIPIDEMIA 09/28/2007  . DEPRESSION 09/28/2007  . FATTY LIVER DISEASE 09/28/2007  . LIVER FUNCTION TESTS, ABNORMAL 09/28/2007  . UNSPECIFIED HYPOTHYROIDISM 06/13/2007    Medications Prior to Visit Current Outpatient Prescriptions on File Prior to Visit  Medication Sig Dispense Refill   . albuterol (PROVENTIL) (2.5 MG/3ML) 0.083% nebulizer solution Take 3 mLs (2.5 mg total) by nebulization every 6 (six) hours as needed for wheezing or shortness of breath. 150 mL 4  . fluticasone (FLONASE) 50 MCG/ACT nasal spray USE TWO SPRAYS IN EACH NOSTRIL DAILY  16 g 1  . levothyroxine (SYNTHROID, LEVOTHROID) 125 MCG tablet Take 1 tablet (125 mcg total) by mouth daily. 30 tablet 1  . metFORMIN (GLUCOPHAGE) 500 MG tablet Take 1 tablet (500 mg total) by mouth 2 (two) times daily with a meal. 180 tablet 0  . mupirocin (BACTROBAN) 2 % ointment Apply topically 2 (two) times daily.      [name] omeprazole (PRILOSEC) 40 MG capsule Take 1 capsule (40 mg total) by mouth daily. 30 capsule 6  . Oxycodone HCl 20 MG TABS Take 10 mg by mouth 2 (two) times daily.    . OxyCODONE HCl ER (OXYCONTIN) 30 MG T12A Take 40 mg by mouth 3 (three) times daily.      No current facility-administered medications on file prior to visit.    Current Medications (verified) Current Outpatient Prescriptions  Medication Sig Dispense Refill  . albuterol (PROVENTIL) (2.5 MG/3ML) 0.083% nebulizer solution Take 3 mLs (2.5 mg total) by nebulization every 6 (six) hours as needed for wheezing or shortness of breath. 150 mL 4  . alendronate (FOSAMAX) 70 MG tablet Take 1 tablet (70 mg total) by mouth every 7 (seven) days. Take with a full glass of water on an empty stomach. 4 tablet 11  . ALPRAZolam (XANAX) 1 MG tablet TAKE 1 TO 1 & 1/2 TABLETS BY MOUTH EVERY DAY AS NEEDED FOR ANXIETY 45 tablet 5  . baclofen (  LIORESAL) 10 MG tablet Take 1 tablet (10 mg total) by mouth 2 (two) times daily. 30 each 1  . fluticasone (FLONASE) 50 MCG/ACT nasal spray USE TWO SPRAYS IN EACH NOSTRIL DAILY  16 g 1  . levothyroxine (SYNTHROID, LEVOTHROID) 125 MCG tablet Take 1 tablet (125 mcg total) by mouth daily. 30 tablet 1  . metFORMIN (GLUCOPHAGE) 500 MG tablet Take 1 tablet (500 mg total) by mouth 2 (two) times daily with a meal. 180 tablet 0  . mupirocin  (BACTROBAN) 2 % ointment Apply topically 2 (two) times daily.      [name] omeprazole (PRILOSEC) 40 MG capsule Take 1 capsule (40 mg total) by mouth daily. 30 capsule 6  . Oxycodone HCl 20 MG TABS Take 10 mg by mouth 2 (two) times daily.    . OxyCODONE HCl ER (OXYCONTIN) 30 MG T12A Take 40 mg by mouth 3 (three) times daily.     [name] tiotropium (SPIRIVA) 18 MCG inhalation capsule Place 1 capsule (18 mcg total) into inhaler and inhale daily. 30 capsule 6   No current facility-administered medications for this visit.     Allergies (verified) Budesonide-formoterol fumarate; Codeine; Cymbalta; Effexor xr; Fentanyl; Fluticasone-salmeterol; Neurontin; Tramadol-acetaminophen; and Naproxen   PAST HISTORY  Family History Family History  Problem Relation Age of Onset  . Heart disease Mother 18    MI  . Hyperlipidemia Mother   . Heart disease Father 72    MI  . Diabetes Father   . Cancer Maternal Grandmother     hodgkins lympoma  . Cirrhosis Sister     primary biliary  . Rheum arthritis Mother   . Rheum arthritis Father     Social History History  Substance Use Topics  . Smoking status: Current Every Day Smoker -- 1.00 packs/day for 43 years    Types: Cigarettes  . Smokeless tobacco: Never Used  . Alcohol Use: 2.5 oz/week    5 drink(s) per week     Are there smokers in your home (other than you)? Yes  Risk Factors Current exercise habits: stretching per pt.   Dietary issues discussed: none   Cardiac risk factors: diabetes mellitus, dyslipidemia, family history of premature cardiovascular disease and smoking/ tobacco exposure.  Depression Screen (Note: if answer to either of the following is "Yes", a more complete depression screening is indicated)   Over the past two weeks, have you felt down, depressed or hopeless? Yes  Over the past two weeks, have you felt little interest or pleasure in doing things? Yes  Have you lost interest or pleasure in daily life? Yes  Do you often feel  hopeless? Yes  Do you cry easily over simple problems? No  Activities of Daily Living In your present state of health, do you have any difficulty performing the following activities?:  Driving? No Managing money?  No Feeding yourself? No Getting from bed to chair? No Climbing a flight of stairs? No Preparing food and eating?: No Bathing or showering? No Getting dressed: No Getting to the toilet? No Using the toilet:No Moving around from place to place: No In the past year have you fallen or had a near fall?:Yes   Are you sexually active?  No  Do you have more than one partner?  No  Hearing Difficulties: No Do you often ask people to speak up or repeat themselves? No Do you experience ringing or noises in your ears? No Do you have difficulty understanding soft or whispered voices? No   Do you  feel that you have a problem with memory? No  Do you often misplace items? No  Do you feel safe at home?  No  Cognitive Testing  Alert? Yes  Normal Appearance?Yes  Oriented to person? Yes  Place? Yes   Time? Yes  Recall of three objects?  Yes  Can perform simple calculations? Yes  Displays appropriate judgment?Yes  Can read the correct time from a watch face?Yes   Advanced Directives have been discussed with the patient? Yes  List the Names of Other Physician/Practitioners you currently use: 1.  Dr. Clearance Coots- eyes 2. Dr. Manon Hilding- pain management  Indicate any recent Medical Services you may have received from other than [facility] providers in the past year (date may be approximate).  Immunization History  Administered Date(s) Administered  . H1N1 07/03/2008  . Influenza Split 03/05/2011, 06/14/2012  . Influenza Whole 03/14/2008  . Influenza,inj,Quad PF,36+ Mos 03/14/2013, 04/02/2014  . Pneumococcal Conjugate-13 09/21/2013  . Pneumococcal Polysaccharide-23 05/31/2001  . Td 10/17/2007  . Zoster 12/08/2010    Screening Tests Health Maintenance  Topic Date Due  . URINE  MICROALBUMIN  07/19/2012  . OPHTHALMOLOGY EXAM  03/02/2013  . FOOT EXAM  07/13/2014  . COLONOSCOPY  07/27/2015 (Originally 08/18/1999)  . HIV Screening  07/27/2015 (Originally 08/17/1964)  . HEMOGLOBIN A1C  09/28/2014  . INFLUENZA VACCINE  12/30/2014  . MAMMOGRAM  10/19/2015  . TETANUS/TDAP  10/16/2017  . ZOSTAVAX  Completed    All answers were reviewed with the patient and necessary referrals were made:  Tandy Gaw, PA-C   07/26/2014   History reviewed: allergies, current medications, past family history, past medical history, past social history, past surgical history and problem list  Review of Systems A comprehensive review of systems was negative.    Objective:     Vision by Snellen chart: right GYI:RSWNIOE declines measurement, left VOJ:JKKXFGH declines measurement  Body mass index is 23.57 kg/(m^2). BP 137/77 mmHg  Pulse 87  Temp(Src) 98.1 F (36.7 C) (Oral)  Resp 16  Ht 5\' 3"  (1.6 m)  Wt 133 lb (60.328 kg)  BMI 23.57 kg/m2  SpO2 95%  BP 137/77 mmHg  Pulse 87  Temp(Src) 98.1 F (36.7 C) (Oral)  Resp 16  Ht 5\' 3"  (1.6 m)  Wt 133 lb (60.328 kg)  BMI 23.57 kg/m2  SpO2 95%  General Appearance:    Alert, cooperative, no distress, appears stated age  Head:    Normocephalic, without obvious abnormality, atraumatic  Eyes:    PERRL, conjunctiva/corneas clear, EOM's intact, fundi    benign, both eyes  Ears:    Normal TM's and external ear canals, both ears  Nose:   Nares normal, septum midline, mucosa normal, no drainage    or sinus tenderness  Throat:   Lips, mucosa, and tongue normal; teeth and gums normal  Neck:   Supple, symmetrical, trachea midline, no adenopathy;    thyroid:  no enlargement/tenderness/nodules; no carotid   bruit or JVD  Back:     Symmetric, no curvature, ROM normal, no CVA tenderness  Lungs:     Clear to auscultation bilaterally, respirations unlabored  Chest Wall:    No tenderness or deformity   Heart:    Regular rate and rhythm, S1  and S2 normal, no murmur, rub   or gallop  Breast Exam:   Not done.   Abdomen:     Soft, non-tender, bowel sounds active all four quadrants,    no masses, no organomegaly  Genitalia:  Notd done.  Rectal:    Not done.   Extremities:   Extremities normal, atraumatic, no cyanosis or edema  Pulses:   2+ and symmetric all extremities  Skin:   Skin color, texture, turgor normal, no rashes or lesions  Lymph nodes:   Cervical, supraclavicular, and axillary nodes normal  Neurologic:   CNII-XII intact, normal strength, sensation and reflexes    throughout       Assessment:     Normal female assessment.      Plan:     During the course of the visit the patient was educated and counseled about appropriate screening and preventive services including:    Pneumococcal vaccine   Screening mammography  Bone densitometry screening  Colorectal cancer screening  Diabetes screening  Smoking cessation counseling  Pneumonia shots up to date.  Pt declined colonoscopy. Declined cologuard at this time as well.   Fasting labs ordered.   Hypothyroidism- needs TSH and refill accordingly.   DM- ordered A1C. Well controlled on metformin in the past. Discussed needs eye exam.   Last mammo 10/18/13 will get bone density and mammo after those dates.   Bilateral arm pain- see pain management for chronic pain. Seems muscular. Discussed relaxation, massage, muscle relaxers, heat. Follow up if not improving.    Patient Instructions (the written plan) was given to the patient.  Medicare Attestation I have personally reviewed: The patient's medical and social history Their use of alcohol, tobacco or illicit drugs Their current medications and supplements The patient's functional ability including ADLs,fall risks, home safety risks, cognitive, and hearing and visual impairment Diet and physical activities Evidence for depression or mood disorders  The patient's weight, height, BMI, and visual  acuity have been recorded in the chart.  I have made referrals, counseling, and provided education to the patient based on review of the above and I have provided the patient with a written personalized care plan for preventive services.     Tandy Gaw, PA-C   07/26/2014

## 2014-07-30 ENCOUNTER — Other Ambulatory Visit: Payer: Self-pay | Admitting: Physician Assistant

## 2014-07-30 DIAGNOSIS — E119 Type 2 diabetes mellitus without complications: Secondary | ICD-10-CM | POA: Diagnosis not present

## 2014-07-30 DIAGNOSIS — E039 Hypothyroidism, unspecified: Secondary | ICD-10-CM | POA: Diagnosis not present

## 2014-07-30 DIAGNOSIS — G8929 Other chronic pain: Secondary | ICD-10-CM | POA: Diagnosis not present

## 2014-07-30 DIAGNOSIS — E785 Hyperlipidemia, unspecified: Secondary | ICD-10-CM | POA: Diagnosis not present

## 2014-07-30 DIAGNOSIS — Z79899 Other long term (current) drug therapy: Secondary | ICD-10-CM | POA: Diagnosis not present

## 2014-07-31 LAB — COMPLETE METABOLIC PANEL WITH GFR
ALBUMIN: 4.4 g/dL (ref 3.5–5.2)
ALT: 20 U/L (ref 0–35)
AST: 28 U/L (ref 0–37)
Alkaline Phosphatase: 87 U/L (ref 39–117)
BUN: 7 mg/dL (ref 6–23)
CO2: 29 mEq/L (ref 19–32)
Calcium: 9.9 mg/dL (ref 8.4–10.5)
Chloride: 103 mEq/L (ref 96–112)
Creat: 0.71 mg/dL (ref 0.50–1.10)
GLUCOSE: 133 mg/dL — AB (ref 70–99)
POTASSIUM: 4.3 meq/L (ref 3.5–5.3)
Sodium: 141 mEq/L (ref 135–145)
Total Bilirubin: 0.3 mg/dL (ref 0.2–1.2)
Total Protein: 7.1 g/dL (ref 6.0–8.3)

## 2014-07-31 LAB — TSH: TSH: 0.46 u[IU]/mL (ref 0.350–4.500)

## 2014-07-31 LAB — LIPID PANEL
Cholesterol: 240 mg/dL — ABNORMAL HIGH (ref 0–200)
HDL: 69 mg/dL (ref >=46)
LDL CALC: 136 mg/dL — AB (ref 0–99)
Total CHOL/HDL Ratio: 3.5 Ratio
Triglycerides: 177 mg/dL — ABNORMAL HIGH (ref <150)
VLDL: 35 mg/dL (ref 0–40)

## 2014-07-31 LAB — HEMOGLOBIN A1C
Hgb A1c MFr Bld: 6.2 % — ABNORMAL HIGH (ref <5.7)
Mean Plasma Glucose: 131 mg/dL — ABNORMAL HIGH (ref <117)

## 2014-08-02 ENCOUNTER — Other Ambulatory Visit: Payer: Self-pay | Admitting: *Deleted

## 2014-08-02 ENCOUNTER — Other Ambulatory Visit: Payer: Self-pay | Admitting: Physician Assistant

## 2014-08-02 MED ORDER — ATORVASTATIN CALCIUM 40 MG PO TABS: 40.0000 mg | ORAL_TABLET | Freq: Every day | ORAL | Status: DC

## 2014-08-02 MED ORDER — LEVOTHYROXINE SODIUM 125 MCG PO TABS: 125.0000 ug | ORAL_TABLET | Freq: Every day | ORAL | Status: DC

## 2014-08-05 ENCOUNTER — Telehealth: Payer: Self-pay | Admitting: *Deleted

## 2014-08-05 NOTE — Telephone Encounter (Signed)
[name] left vm this morning just wanting you to know that she thinks going off her antidepressant was a bad idea & that she is going to start back on the citalopram.

## 2014-08-14 ENCOUNTER — Other Ambulatory Visit: Payer: Medicare Other

## 2014-08-14 DIAGNOSIS — Z79891 Long term (current) use of opiate analgesic: Secondary | ICD-10-CM | POA: Diagnosis not present

## 2014-08-14 DIAGNOSIS — M5417 Radiculopathy, lumbosacral region: Secondary | ICD-10-CM | POA: Diagnosis not present

## 2014-08-14 DIAGNOSIS — M47817 Spondylosis without myelopathy or radiculopathy, lumbosacral region: Secondary | ICD-10-CM | POA: Diagnosis not present

## 2014-08-14 DIAGNOSIS — F329 Major depressive disorder, single episode, unspecified: Secondary | ICD-10-CM | POA: Diagnosis not present

## 2014-08-14 DIAGNOSIS — G894 Chronic pain syndrome: Secondary | ICD-10-CM | POA: Diagnosis not present

## 2014-08-15 DIAGNOSIS — H43813 Vitreous degeneration, bilateral: Secondary | ICD-10-CM | POA: Diagnosis not present

## 2014-08-21 ENCOUNTER — Other Ambulatory Visit: Payer: Self-pay | Admitting: Physician Assistant

## 2014-08-22 DIAGNOSIS — E119 Type 2 diabetes mellitus without complications: Secondary | ICD-10-CM | POA: Diagnosis not present

## 2014-08-22 LAB — HM DIABETES EYE EXAM

## 2014-09-13 DIAGNOSIS — G4700 Insomnia, unspecified: Secondary | ICD-10-CM | POA: Diagnosis not present

## 2014-09-13 DIAGNOSIS — F329 Major depressive disorder, single episode, unspecified: Secondary | ICD-10-CM | POA: Diagnosis not present

## 2014-09-13 DIAGNOSIS — G894 Chronic pain syndrome: Secondary | ICD-10-CM | POA: Diagnosis not present

## 2014-09-13 DIAGNOSIS — M47817 Spondylosis without myelopathy or radiculopathy, lumbosacral region: Secondary | ICD-10-CM | POA: Diagnosis not present

## 2014-10-08 ENCOUNTER — Telehealth: Payer: Self-pay | Admitting: *Deleted

## 2014-10-08 NOTE — Telephone Encounter (Signed)
Yes ok. Needs to stay on vitamin D 800units daily and calcium 600mg  bid.

## 2014-10-08 NOTE — Telephone Encounter (Signed)
Pt called today stating that she had to have 2 teeth removed.  She then got an infection and dry socket.  Her dentist wanted her to ask you if she could stop the fosamax for 3 months.  Please advise.

## 2014-10-08 NOTE — Telephone Encounter (Signed)
Pt notified of instructions

## 2014-10-09 ENCOUNTER — Ambulatory Visit (INDEPENDENT_AMBULATORY_CARE_PROVIDER_SITE_OTHER): Payer: Medicare Other | Admitting: Physical Therapy

## 2014-10-09 ENCOUNTER — Encounter: Payer: Self-pay | Admitting: Physical Therapy

## 2014-10-09 DIAGNOSIS — R29898 Other symptoms and signs involving the musculoskeletal system: Secondary | ICD-10-CM

## 2014-10-09 DIAGNOSIS — M25511 Pain in right shoulder: Secondary | ICD-10-CM

## 2014-10-09 NOTE — Patient Instructions (Signed)
Over Head Pull: Narrow Grip     [place] [phone]   On back, knees bent, feet flat, band across thighs, elbows straight but relaxed. Pull hands apart (start). Keeping elbows straight, bring arms up and over head, hands toward floor. Keep pull steady on band. Hold momentarily. Return slowly, keeping pull steady, back to start. Repeat 2-3x10___ times. Band color __green____   Side Pull: Double Arm   On back, knees bent, feet flat. Arms perpendicular to body, shoulder level, elbows straight but relaxed. Pull arms out to sides, elbows straight. Resistance band comes across collarbones, hands toward floor. Hold momentarily. Slowly return to starting position. Repeat 2-3x10___ times. Band color __green___   Sash   On back, knees bent, feet flat, left hand on left hip, right hand above left. Pull right arm DIAGONALLY (hip to shoulder) across chest. Bring right arm along head toward floor. Hold momentarily. Slowly return to starting position. Repeat 2-3x10___ times. Do with left arm. Band color __green____   Shoulder Rotation: Double Arm   On back, knees bent, feet flat, elbows tucked at sides, bent 90, hands palms up. Pull hands apart and down toward floor, keeping elbows near sides. Hold momentarily. Slowly return to starting position. Repeat _2-3x10__ times. Band color ___green___   

## 2014-10-09 NOTE — Therapy (Addendum)
[place] [address] [place], [place], [address] Phone: [phone]   Fax:  [phone]  Physical Therapy Evaluation  Patient Details  Name: [name] MRN: [id number] Date of Birth: 08-Apr-1951 Referring Provider:  Margaretha Sheffield, MD  Encounter Date: 10/09/2014      PT End of Session - 10/09/14 1601    Visit Number 1   Number of Visits 3   Date for PT Re-Evaluation 10/30/14   PT Start Time 1529   PT Stop Time 1601   PT Time Calculation (min) 32 min   Activity Tolerance Patient tolerated treatment well      Past Medical History  Diagnosis Date  . Osteoarthritis   . Cervicalgia   . Chronic pain   . Narcotic abuse     history  . Diabetes mellitus   . Thyroid disease     hypothyroid  . Hyperlipidemia   . COPD (chronic obstructive pulmonary disease)   . Osteoporosis   . Fatty liver   . DDD (degenerative disc disease), lumbar     Past Surgical History  Procedure Laterality Date  . Cholecystectomy    . Breast surgery      braest lump/ benign  . Total knee arthroplasty  2010    right  . Lumbar laminectomy  05/2010    Dr. Rushie Nyhan    There were no vitals filed for this visit.  Visit Diagnosis:  Shoulder weakness - Plan: PT plan of care cert/re-cert  Pain in joint, shoulder region, right - Plan: PT plan of care cert/re-cert      Subjective Assessment - 10/09/14 1534    Subjective Pt reports her shoulder is feeling about 60-70% better because she been throwing the ball for her dogs with her Lt UE instead of her Rt   Diagnostic tests none taken   Patient Stated Goals throw the ball with her right hand   Currently in Pain? Yes   Pain Score 3    Pain Location Shoulder   Pain Orientation Right;Posterior   Pain Descriptors / Indicators --  like a bad bruise   Pain Type Acute pain   Pain Onset More than a month ago   Pain Frequency Intermittent   Aggravating Factors  throwing the ball with Rt UE     Pain Relieving Factors rest and heat            [facility] PT Assessment - 10/09/14 0001    Assessment   Medical Diagnosis Rt shoulder rotator cuff syndrome   Onset Date 07/11/14   Next MD Visit 10/11/14   Prior Therapy none   Precautions   Precautions None   Balance Screen   Has the patient fallen in the past 6 months --  probably, I fall all the time in the yard pulling weeds   Has the patient had a decrease in activity level because of a fear of falling?  No   Is the patient reluctant to leave their home because of a fear of falling?  No   Prior Function   Level of Independence --  I with all basic IADLs   Vocation Retired   Leisure play with dogs,    Observation/Other Assessments   Focus on Therapeutic Outcomes (FOTO)  40% limited   Posture/Postural Control   Posture/Postural Control Postural limitations   Postural Limitations Rounded Shoulders;Forward head;Increased thoracic kyphosis   ROM / Strength   AROM / PROM / Strength AROM;Strength   AROM  Overall AROM  --  neck and bilat UE's WNL, slight decrease in Rt shoulder wit   Overall AROM Comments reaching behind the back.  able to perform all functional tasks   Strength   Strength Assessment Site Shoulder   Right/Left Shoulder Right;Left   Right Shoulder Flexion 4+/5   Right Shoulder ABduction 5/5   Right Shoulder Internal Rotation 5/5   Right Shoulder External Rotation --  5-/5   Left Shoulder Flexion 5/5   Left Shoulder ABduction 5/5   Left Shoulder Internal Rotation 5/5   Left Shoulder External Rotation 4/5   Palpation   Palpation some tenderness at Rt acriomion progesse                   [facility] Adult PT Treatment/Exercise - 10/09/14 0001    Exercises   Exercises Shoulder   Shoulder Exercises: Supine   Horizontal ABduction Both;20 reps;Theraband   Theraband Level (Shoulder Horizontal ABduction) Level 3 (Green)   External Rotation Both;20 reps;Theraband   Theraband Level (Shoulder External  Rotation) Level 3 (Green)   Other Supine Exercises 2x10 SASH and overhead with green band                PT Education - 10/09/14 1547    Education provided Yes   Education Details HEP with green band   Person(s) Educated Patient   Methods Explanation;Demonstration;Handout   Comprehension Returned demonstration             PT Long Term Goals - 10/09/14 1553    PT LONG TERM GOAL #1   Title I with HEP   Time 4   Period Weeks   Status New   PT LONG TERM GOAL #2   Title throw ball for her dogs without Rt shoulder pain   Time 4   Period Weeks   Status New   PT LONG TERM GOAL #3   Title increase strength mid traps =/> 4+/5    Time 4   Period Weeks   Status New   PT LONG TERM GOAL #4   Title improve FOTO =/< CJ level   Time 4   Period Weeks   Status New               Plan - 10/09/14 1651    Clinical Impression Statement Pt reports Rt shoulder pain starting about 3 months ago, the shoulder is feeling a lot better since she hasn't been using it. We will issue her a HEP and she will start with this.  She can return for formal treatment if her pain returns or if she has difficulty with the exercise.      Pt will benefit from skilled therapeutic intervention in order to improve on the following deficits Pain;Decreased strength;Impaired UE functional use   Rehab Potential Good   PT Frequency --  1-2 more visits as needed   PT Duration 4 weeks   PT Treatment/Interventions Moist Heat;Patient/family education;Therapeutic exercise;Ultrasound;Cryotherapy;Electrical Stimulation;Manual techniques   PT Next Visit Plan as needed ther ex   Consulted and Agree with Plan of Care Patient         Problem List Patient Active Problem List   Diagnosis Date Noted  . COLD (chronic obstructive lung disease) 12/06/2013  . Chronic pain syndrome 12/05/2013  . Right shoulder pain 09/23/2013  . Ganglion cyst of wrist 07/13/2013  . Anxiety state 05/16/2013  . Trochanteric  bursitis of right hip 05/04/2013  . Diabetes type 2, controlled 03/14/2013  . Hypothyroidism  03/14/2013  . Arthritis of carpometacarpal joint 02/14/2012  . Sacroiliitis 09/14/2011  . Chronic headache 04/03/2011  . Tobacco abuse 04/03/2011  . Chronic pain 04/03/2011  . Hypertension 04/03/2011  . Allergic rhinitis, cause unspecified 10/06/2010  . CHRONIC OBSTRUCTIVE PULMONARY DISEASE, ACUTE EXACERBATION 08/14/2010  . ALCOHOLIC FATTY LIVER 41/63/8453  . ALLERGIC REACTION 12/24/2009  . COPD 12/04/2009  . HYPERCALCEMIA 11/13/2009  . ARTHRITIS, ACROMIOCLAVICULAR 07/31/2008  . SPINAL STENOSIS IN CERVICAL REGION 07/31/2008  . ROTATOR CUFF SYNDROME, RIGHT 04/01/2008  . OSTEOPOROSIS 10/17/2007  . DYSLIPIDEMIA 09/28/2007  . DEPRESSION 09/28/2007  . FATTY LIVER DISEASE 09/28/2007  . LIVER FUNCTION TESTS, ABNORMAL 09/28/2007  . UNSPECIFIED HYPOTHYROIDISM 06/13/2007    Jeral Pinch, PT 10/09/2014, 4:57 PM  [facility] [place], [place], [address] Phone: [phone]   Fax:  [phone]    PHYSICAL THERAPY DISCHARGE SUMMARY  Visits from Start of Care: 1  Current functional level related to goals / functional outcomes: unknown   Remaining deficits: unknown   Education / Equipment: HEP Plan:                                                    Patient goals were not met. Patient is being discharged due to not returning since the last visit.  ?????    Jeral Pinch, PT 11/08/2014 2:03 PM

## 2014-10-11 DIAGNOSIS — G894 Chronic pain syndrome: Secondary | ICD-10-CM | POA: Diagnosis not present

## 2014-10-11 DIAGNOSIS — F329 Major depressive disorder, single episode, unspecified: Secondary | ICD-10-CM | POA: Diagnosis not present

## 2014-10-11 DIAGNOSIS — G4700 Insomnia, unspecified: Secondary | ICD-10-CM | POA: Diagnosis not present

## 2014-10-11 DIAGNOSIS — M47817 Spondylosis without myelopathy or radiculopathy, lumbosacral region: Secondary | ICD-10-CM | POA: Diagnosis not present

## 2014-11-05 ENCOUNTER — Other Ambulatory Visit: Payer: Self-pay | Admitting: Family Medicine

## 2014-11-07 ENCOUNTER — Other Ambulatory Visit: Payer: Self-pay

## 2014-11-07 MED ORDER — CITALOPRAM HYDROBROMIDE 20 MG PO TABS: 20.0000 mg | ORAL_TABLET | Freq: Every day | ORAL | Status: DC

## 2014-11-11 ENCOUNTER — Ambulatory Visit (INDEPENDENT_AMBULATORY_CARE_PROVIDER_SITE_OTHER): Payer: Medicare Other | Admitting: Sports Medicine

## 2014-11-11 ENCOUNTER — Encounter: Payer: Self-pay | Admitting: Sports Medicine

## 2014-11-11 DIAGNOSIS — J410 Simple chronic bronchitis: Secondary | ICD-10-CM

## 2014-11-11 MED ORDER — PREDNISONE 50 MG PO TABS: 50.0000 mg | ORAL_TABLET | Freq: Every day | ORAL | Status: DC

## 2014-11-11 MED ORDER — HYDROCOD POLST-CPM POLST ER 10-8 MG/5ML PO SUER: 5.0000 mL | Freq: Two times a day (BID) | ORAL | Status: DC | PRN

## 2014-11-11 MED ORDER — AZITHROMYCIN 250 MG PO TABS: ORAL_TABLET | ORAL | Status: DC

## 2014-11-11 NOTE — Assessment & Plan Note (Signed)
Currently in acute exacerbation. Prednisone, she will continue to use her oral short acting bronchidilators, fluticasone. Azithromycin. Chest x-ray. Continues to smoke, tells me she's tried everything and is still pre-contemplative. Adding short course of Tussionex, she will let her pain doctor know this before she fills the prescription so that she does not get fired from the pain clinic.

## 2014-11-11 NOTE — Progress Notes (Signed)
  Subjective:    CC: coughing, sinus infection  HPI: [name] is a pleasant 64 year old female, she has history of COPD, and continues to smoke, has tried everything to quit. Unfortunately for the past several weeks she's had increasing cough, productive, with pain and pressure over the frontal, maxillary sinuses with radiation to the upper teeth. No constitutional symptoms, no chest pain. Symptoms are moderate, persistent.  Past medical history, Surgical history, Family history not pertinant except as noted below, Social history, Allergies, and medications have been entered into the medical record, reviewed, and no changes needed.   Review of Systems: No fevers, chills, night sweats, weight loss, chest pain, or shortness of breath.   Objective:    General: Well Developed, well nourished, and in no acute distress.  Neuro: Alert and oriented x3, extra-ocular muscles intact, sensation grossly intact.  HEENT: Normocephalic, atraumatic, pupils equal round reactive to light, neck supple, no masses, no lymphadenopathy, thyroid nonpalpable. Oropharynx, nasopharynx, ear canals unremarkable. Skin: Warm and dry, no rashes. Cardiac: Regular rate and rhythm, no murmurs rubs or gallops, no lower extremity edema.  Respiratory: coarse sounds throughout with expiratory wheezes. Not using accessory muscles, speaking in full sentences.  Impression and Recommendations:

## 2014-11-12 DIAGNOSIS — F329 Major depressive disorder, single episode, unspecified: Secondary | ICD-10-CM | POA: Diagnosis not present

## 2014-11-12 DIAGNOSIS — G894 Chronic pain syndrome: Secondary | ICD-10-CM | POA: Diagnosis not present

## 2014-11-12 DIAGNOSIS — M47817 Spondylosis without myelopathy or radiculopathy, lumbosacral region: Secondary | ICD-10-CM | POA: Diagnosis not present

## 2014-11-12 DIAGNOSIS — G4700 Insomnia, unspecified: Secondary | ICD-10-CM | POA: Diagnosis not present

## 2014-11-14 ENCOUNTER — Telehealth: Payer: Self-pay

## 2014-11-14 MED ORDER — FLUTICASONE PROPIONATE 50 MCG/ACT NA SUSP: 2.0000 | Freq: Every day | NASAL | Status: DC

## 2014-11-14 NOTE — Telephone Encounter (Signed)
Patient requested refill for Flonase. Patient has been informed that it was sent to the pharmacy electronically. Louella Medaglia,CMA

## 2014-11-28 LAB — BASIC METABOLIC PANEL
BUN: 7 mg/dL (ref 4–21)
Creatinine: 0.7 mg/dL (ref 0.5–1.1)
GLUCOSE: 89 mg/dL
Potassium: 4.5 mmol/L (ref 3.4–5.3)
Sodium: 139 mmol/L (ref 137–147)

## 2014-11-28 LAB — HEPATIC FUNCTION PANEL
ALT: 23 U/L (ref 7–35)
AST: 19 U/L (ref 13–35)
Alkaline Phosphatase: 88 U/L (ref 25–125)
BILIRUBIN, TOTAL: 0.2 mg/dL

## 2014-12-03 ENCOUNTER — Telehealth: Payer: Self-pay

## 2014-12-03 DIAGNOSIS — G4700 Insomnia, unspecified: Secondary | ICD-10-CM | POA: Diagnosis not present

## 2014-12-03 DIAGNOSIS — M47817 Spondylosis without myelopathy or radiculopathy, lumbosacral region: Secondary | ICD-10-CM | POA: Diagnosis not present

## 2014-12-03 DIAGNOSIS — G894 Chronic pain syndrome: Secondary | ICD-10-CM | POA: Diagnosis not present

## 2014-12-03 DIAGNOSIS — F329 Major depressive disorder, single episode, unspecified: Secondary | ICD-10-CM | POA: Diagnosis not present

## 2014-12-03 NOTE — Telephone Encounter (Signed)
Patient called inquiring about a CT order, i advised the patient that she would need to get Xray done that was already ordered and the doctor will go from there. Rhonda Cunningham,CMA

## 2014-12-06 DIAGNOSIS — Z791 Long term (current) use of non-steroidal anti-inflammatories (NSAID): Secondary | ICD-10-CM | POA: Diagnosis not present

## 2014-12-06 DIAGNOSIS — M1990 Unspecified osteoarthritis, unspecified site: Secondary | ICD-10-CM | POA: Diagnosis not present

## 2014-12-06 DIAGNOSIS — Z885 Allergy status to narcotic agent status: Secondary | ICD-10-CM | POA: Diagnosis not present

## 2014-12-06 DIAGNOSIS — Z79899 Other long term (current) drug therapy: Secondary | ICD-10-CM | POA: Diagnosis not present

## 2014-12-06 DIAGNOSIS — R52 Pain, unspecified: Secondary | ICD-10-CM | POA: Diagnosis not present

## 2014-12-06 DIAGNOSIS — F17210 Nicotine dependence, cigarettes, uncomplicated: Secondary | ICD-10-CM | POA: Diagnosis not present

## 2014-12-06 DIAGNOSIS — M549 Dorsalgia, unspecified: Secondary | ICD-10-CM | POA: Diagnosis not present

## 2014-12-06 DIAGNOSIS — Z888 Allergy status to other drugs, medicaments and biological substances status: Secondary | ICD-10-CM | POA: Diagnosis not present

## 2014-12-06 DIAGNOSIS — M25532 Pain in left wrist: Secondary | ICD-10-CM | POA: Diagnosis not present

## 2014-12-06 DIAGNOSIS — E079 Disorder of thyroid, unspecified: Secondary | ICD-10-CM | POA: Diagnosis not present

## 2014-12-06 DIAGNOSIS — G8929 Other chronic pain: Secondary | ICD-10-CM | POA: Diagnosis not present

## 2014-12-06 DIAGNOSIS — Z7951 Long term (current) use of inhaled steroids: Secondary | ICD-10-CM | POA: Diagnosis not present

## 2014-12-06 DIAGNOSIS — K219 Gastro-esophageal reflux disease without esophagitis: Secondary | ICD-10-CM | POA: Diagnosis not present

## 2014-12-06 DIAGNOSIS — J449 Chronic obstructive pulmonary disease, unspecified: Secondary | ICD-10-CM | POA: Diagnosis not present

## 2014-12-08 ENCOUNTER — Encounter: Payer: Self-pay | Admitting: Emergency Medicine

## 2014-12-08 ENCOUNTER — Emergency Department (INDEPENDENT_AMBULATORY_CARE_PROVIDER_SITE_OTHER)
Admission: EM | Admit: 2014-12-08 | Discharge: 2014-12-08 | Disposition: A | Discharge status: Home / Self Care | Payer: Medicare Other | Source: Home / Self Care | Attending: Family Medicine | Admitting: Family Medicine

## 2014-12-08 ENCOUNTER — Emergency Department (INDEPENDENT_AMBULATORY_CARE_PROVIDER_SITE_OTHER): Payer: Medicare Other

## 2014-12-08 DIAGNOSIS — S62665A Nondisplaced fracture of distal phalanx of left ring finger, initial encounter for closed fracture: Secondary | ICD-10-CM

## 2014-12-08 DIAGNOSIS — M79642 Pain in left hand: Secondary | ICD-10-CM

## 2014-12-08 DIAGNOSIS — M19042 Primary osteoarthritis, left hand: Secondary | ICD-10-CM | POA: Diagnosis not present

## 2014-12-08 DIAGNOSIS — M25532 Pain in left wrist: Secondary | ICD-10-CM

## 2014-12-08 DIAGNOSIS — M19032 Primary osteoarthritis, left wrist: Secondary | ICD-10-CM | POA: Diagnosis not present

## 2014-12-08 MED ORDER — MELOXICAM 7.5 MG PO TABS: 7.5000 mg | ORAL_TABLET | Freq: Every day | ORAL | Status: DC

## 2014-12-08 MED ORDER — KETOROLAC TROMETHAMINE 60 MG/2ML IM SOLN
60.0000 mg | Freq: Once | INTRAMUSCULAR | Status: AC
  Administered 2014-12-08: 60 mg via INTRAMUSCULAR

## 2014-12-08 NOTE — ED Notes (Signed)
Patient advises that she woke with left hand red and swollen on Friday.

## 2014-12-08 NOTE — ED Provider Notes (Signed)
CSN: [id number]     Arrival date & time 12/08/14  1139 History   First MD Initiated Contact with Patient 12/08/14 1155     Chief Complaint  Patient presents with  . Hand Pain   (Consider location/radiation/quality/duration/timing/severity/associated sxs/prior Treatment) HPI  Patient is a 64 year old female with history of osteoarthritis, osteoporosis and narcotic abuse, presenting to urgent care with complaint of sudden onset left hand and wrist pain and swelling that started when she woke up.  Patient also notes mild edema with redness to radial aspect of hand and wrist. Patient does complain of pain that is burning, aching and throbbing.  Pain is moderate to severe, worse with movement and palpation, she cannot fully close her hand due to pain.  Patient states she sees pain management and is on chronic pain medication, but that has not helped her pain.  Denies known insect bites or injuries.  Patient denies history of gout or RA.  States she has never had similar symptoms.  Denies fevers, chills, nausea, vomiting.     Past Medical History  Diagnosis Date  . Osteoarthritis   . Cervicalgia   . Chronic pain   . Narcotic abuse     history  . Diabetes mellitus   . Thyroid disease     hypothyroid  . Hyperlipidemia   . COPD (chronic obstructive pulmonary disease)   . Osteoporosis   . Fatty liver   . DDD (degenerative disc disease), lumbar    Past Surgical History  Procedure Laterality Date  . Cholecystectomy    . Breast surgery      braest lump/ benign  . Total knee arthroplasty  2010    right  . Lumbar laminectomy  05/2010    Dr. Netta Corrigan   Family History  Problem Relation Age of Onset  . Heart disease Mother 76    MI  . Hyperlipidemia Mother   . Heart disease Father 67    MI  . Diabetes Father   . Cancer Maternal Grandmother     hodgkins lympoma  . Cirrhosis Sister     primary biliary  . Rheum arthritis Mother   . Rheum arthritis Father    History  Substance Use  Topics  . Smoking status: Current Every Day Smoker -- 1.00 packs/day for 43 years    Types: Cigarettes  . Smokeless tobacco: Never Used  . Alcohol Use: 2.5 oz/week    5 drink(s) per week   OB History    No data available     Review of Systems  Constitutional: Negative for fever and chills.  Gastrointestinal: Negative for nausea, vomiting and diarrhea.  Musculoskeletal: Positive for myalgias, joint swelling and arthralgias. Negative for back pain, gait problem, neck pain and neck stiffness.       Left hand and wrist  Skin: Positive for color change. Negative for pallor, rash and wound.  Neurological: Negative for weakness and numbness.    Allergies  Budesonide-formoterol fumarate; Codeine; Cymbalta; Effexor xr; Fentanyl; Fluticasone-salmeterol; Neurontin; Tramadol-acetaminophen; and Naproxen  Home Medications   Prior to Admission medications   Medication Sig Start Date End Date Taking? Authorizing Provider  albuterol (PROVENTIL) (2.5 MG/3ML) 0.083% nebulizer solution Take 3 mLs (2.5 mg total) by nebulization every 6 (six) hours as needed for wheezing or shortness of breath. 12/05/13   Jade L Breeback, PA-C  alendronate (FOSAMAX) 70 MG tablet Take 1 tablet (70 mg total) by mouth every 7 (seven) days. Take with a full glass of water on an empty stomach.  07/22/14   Jade L Breeback, PA-C  ALPRAZolam (XANAX) 1 MG tablet TAKE 1 TO 1 & 1/2 TABLETS BY MOUTH EVERY DAY AS NEEDED FOR ANXIETY 07/22/14   Jade L Breeback, PA-C  atorvastatin (LIPITOR) 40 MG tablet Take 1 tablet (40 mg total) by mouth daily. 08/02/14   Jade L Breeback, PA-C  azithromycin (ZITHROMAX Z-PAK) 250 MG tablet Take 2 tablets (500 mg) on  Day 1,  followed by 1 tablet (250 mg) once daily on Days 2 through 5. 11/11/14   Monica Becton, MD  baclofen (LIORESAL) 10 MG tablet Take 1 tablet (10 mg total) by mouth 2 (two) times daily. 07/22/14   Jade L Breeback, PA-C  chlorpheniramine-HYDROcodone (TUSSIONEX) 10-8 MG/5ML SUER Take 5  mLs by mouth every 12 (twelve) hours as needed for cough (cough, will cause drowsiness.). 11/11/14   Monica Becton, MD  citalopram (CELEXA) 20 MG tablet Take 1 tablet (20 mg total) by mouth daily. 11/07/14   Jade L Breeback, PA-C  fluticasone (FLONASE) 50 MCG/ACT nasal spray Place 2 sprays into both nostrils daily. 11/14/14   Monica Becton, MD  levothyroxine (SYNTHROID, LEVOTHROID) 125 MCG tablet Take 1 tablet (125 mcg total) by mouth daily. 08/02/14   Jade L Breeback, PA-C  meloxicam (MOBIC) 7.5 MG tablet Take 1 tablet (7.5 mg total) by mouth daily. For up to 2 weeks 12/08/14   Junius Finner, PA-C  metFORMIN (GLUCOPHAGE) 500 MG tablet TAKE 1 TABLET BY MOUTH TWICE A DAY WITH MEALS 11/06/14   Jade L Breeback, PA-C  omeprazole (PRILOSEC) 40 MG capsule Take 1 capsule (40 mg total) by mouth daily. 06/03/14   Jade L Breeback, PA-C  predniSONE (DELTASONE) 50 MG tablet Take 1 tablet (50 mg total) by mouth daily. 11/11/14   Monica Becton, MD  tiotropium (SPIRIVA) 18 MCG inhalation capsule Place 1 capsule (18 mcg total) into inhaler and inhale daily. 07/22/14   Jade L Breeback, PA-C   Temp(Src) 98.4 F (36.9 C) (Oral)  Resp 18  Ht 5\' 3"  (1.6 m)  Wt 128 lb (58.06 kg)  BMI 22.68 kg/m2  SpO2 97% Physical Exam  Constitutional: She is oriented to person, place, and time. She appears well-developed and well-nourished.  HENT:  Head: Normocephalic and atraumatic.  Eyes: EOM are normal.  Neck: Normal range of motion.  Cardiovascular: Normal rate.   Pulses:      Radial pulses are 2+ on the left side.  Pulmonary/Chest: Effort normal.  Musculoskeletal: She exhibits edema and tenderness.  Left hand and wrist: mild edema with associated tenderness on Radial aspect. Chronic OA changes in finger joints with Bouchard's nodes.   Limited ROM hand due to severe pain with flexion of thumb and index finger. Increased pain with full flexion and extension Left wrist.  Neurological: She is alert and oriented  to person, place, and time.  Skin: Skin is warm and dry. There is erythema.  Left hand and wrist: erythema and mild edema to Radial aspect wrist and hand. Tender to touch. No induration or fluctuance.  Psychiatric: She has a normal mood and affect. Her behavior is normal.  Nursing note and vitals reviewed.   ED Course  Procedures (including critical care time) Labs Review Labs Reviewed - No data to display  Imaging Review Dg Wrist Complete Left  12/08/2014   CLINICAL DATA:  Wrist pain and swelling for 3 days. No known injury.  EXAM: LEFT WRIST - COMPLETE 3+ VIEW  COMPARISON:  None.  FINDINGS: Advanced degenerative  changes involving the metacarpophalangeal joints and carpometacarpal joint of the thumb. There are hooked osteophytes involving the metacarpal heads. This is typically seen with CPPD arthropathy and hemochromatosis. There also moderate degenerative changes involving the carpal bones. No definite findings for chondrocalcinosis.  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  No acute findings.   Electronically Signed   By: Rudie Meyer M.D.   On: 12/08/2014 13:20   Dg Hand Complete Left  12/08/2014   CLINICAL DATA:  Patient with left wrist and hand pain for 3 days. No known injury. Initial encounter.  EXAM: LEFT HAND - COMPLETE 3+ VIEW  COMPARISON:  None.  FINDINGS: Extensive degenerative changes involving the MCP and PIP joints most pronounced at the middle finger. Extensive degenerative change at the first [facility] joint. Radiocarpal degenerative changes. There is cortical irregularity along the proximal dorsal aspect of distal phalanx of the ring finger.  IMPRESSION: Cortical irregularity along the proximal dorsal aspect of the distal phalanx of the ring finger. This is concerning for an acute fracture. Recommend correlation for point tenderness.  Marked degenerative changes.   Electronically Signed   By: Annia Belt M.D.   On: 12/08/2014 13:19     MDM   1. Left hand  pain   2. Left wrist pain   3. Closed nondisplaced fracture of distal phalanx of left ring finger, initial encounter   4. Osteoarthritis of left hand, unspecified osteoarthritis type    Patient is a 64 year old female with history of osteoarthritis presenting to urgent care with sudden onset left hand and wrist pain.  Exam is consistent with gout.  However, patient states she never has had kilos in the past.  Does not have a history of rheumatoid arthritis.  No known injuries or insect bites.  No fever or chills.  Doubt septic joint.  Patient did request plain films agree that this could show details of joint spacing in possible cause of underlying pain.  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  On hand plain films, concern for acute fracture of left distal ring finger.  When discussed with patient.  Patient does state about 1-2 weeks ago she thought she had fractured her finger in the area due to pain and mild bruising, however, does not recall any specific injury.  Patient reports minimal pain at that site.  Discussed treatment including indomethacin and splinting.  Patient believes wrist splint may put pressure on area and cause increased pain.  Patient declined finger splint as she states that injury was over 2 weeks ago and does not need any splinting at this time due to no pain.  Patient was prescribed meloxicam rather than indomethacin as it is covered by insurance.  Home care instructions provided.  Advised to follow-up with PCP in 1-2 weeks for recheck of symptoms.  May need follow-up with orthopedist. Return precautions provided. Pt verbalized understanding and agreement with tx plan.     Junius Finner, PA-C 12/08/14 1352

## 2014-12-10 ENCOUNTER — Encounter: Payer: Self-pay | Admitting: Family Medicine

## 2014-12-10 ENCOUNTER — Telehealth: Payer: Self-pay | Admitting: Acute Care

## 2014-12-10 ENCOUNTER — Ambulatory Visit (INDEPENDENT_AMBULATORY_CARE_PROVIDER_SITE_OTHER): Payer: Medicare Other | Admitting: Family Medicine

## 2014-12-10 VITALS — BP 120/63 | HR 72 | Wt 131.0 lb

## 2014-12-10 DIAGNOSIS — M25532 Pain in left wrist: Secondary | ICD-10-CM

## 2014-12-10 DIAGNOSIS — E039 Hypothyroidism, unspecified: Secondary | ICD-10-CM

## 2014-12-10 DIAGNOSIS — Z720 Tobacco use: Secondary | ICD-10-CM

## 2014-12-10 DIAGNOSIS — E785 Hyperlipidemia, unspecified: Secondary | ICD-10-CM

## 2014-12-10 DIAGNOSIS — M19032 Primary osteoarthritis, left wrist: Secondary | ICD-10-CM | POA: Insufficient documentation

## 2014-12-10 DIAGNOSIS — E119 Type 2 diabetes mellitus without complications: Secondary | ICD-10-CM

## 2014-12-10 DIAGNOSIS — M109 Gout, unspecified: Secondary | ICD-10-CM

## 2014-12-10 DIAGNOSIS — I10 Essential (primary) hypertension: Secondary | ICD-10-CM

## 2014-12-10 DIAGNOSIS — Z87891 Personal history of nicotine dependence: Secondary | ICD-10-CM

## 2014-12-10 DIAGNOSIS — M10042 Idiopathic gout, left hand: Secondary | ICD-10-CM

## 2014-12-10 DIAGNOSIS — Z122 Encounter for screening for malignant neoplasm of respiratory organs: Secondary | ICD-10-CM

## 2014-12-10 MED ORDER — COLCHICINE 0.6 MG PO TABS: 0.6000 mg | ORAL_TABLET | Freq: Every day | ORAL | Status: DC

## 2014-12-10 NOTE — Progress Notes (Signed)
[name] is a 64 y.o. female who presents to [facility]  today for Diabetes and wrist pain.  1) Patient is currently managed by Tandy Gaw PA. She is doing well with intermittent metformin with an A1c less than 7. She denies any hyper or hypoglycemic episodes. No polyuria or polydipsia. No fevers chills nausea vomiting or diarrhea.  2) left wrist pain. Patient was seen recently in urgent care for left wrist pain thought to be due to gout. She was started on low back after x-rays are normal. This has not helped. No fevers chills nausea vomiting or diarrhea or injury.   Past Medical History  Diagnosis Date  . Osteoarthritis   . Cervicalgia   . Chronic pain   . Narcotic abuse     history  . Diabetes mellitus   . Thyroid disease     hypothyroid  . Hyperlipidemia   . COPD (chronic obstructive pulmonary disease)   . Osteoporosis   . Fatty liver   . DDD (degenerative disc disease), lumbar    Past Surgical History  Procedure Laterality Date  . Cholecystectomy    . Breast surgery      braest lump/ benign  . Total knee arthroplasty  2010    right  . Lumbar laminectomy  05/2010    Dr. Netta Corrigan   History  Substance Use Topics  . Smoking status: Current Every Day Smoker -- 1.00 packs/day for 43 years    Types: Cigarettes  . Smokeless tobacco: Never Used  . Alcohol Use: 2.5 oz/week    5 drink(s) per week   ROS as above Medications: Current Outpatient Prescriptions  Medication Sig Dispense Refill  . albuterol (PROVENTIL) (2.5 MG/3ML) 0.083% nebulizer solution Take 3 mLs (2.5 mg total) by nebulization every 6 (six) hours as needed for wheezing or shortness of breath. 150 mL 4  . alendronate (FOSAMAX) 70 MG tablet Take 1 tablet (70 mg total) by mouth every 7 (seven) days. Take with a full glass of water on an empty stomach. 4 tablet 11  . ALPRAZolam (XANAX) 1 MG tablet TAKE 1 TO 1 & 1/2 TABLETS BY MOUTH EVERY DAY AS NEEDED FOR ANXIETY 45  tablet 5  . chlorpheniramine-HYDROcodone (TUSSIONEX) 10-8 MG/5ML SUER Take 5 mLs by mouth every 12 (twelve) hours as needed for cough (cough, will cause drowsiness.). 120 mL 0  . citalopram (CELEXA) 20 MG tablet Take 1 tablet (20 mg total) by mouth daily. 90 tablet 0  . fluticasone (FLONASE) 50 MCG/ACT nasal spray Place 2 sprays into both nostrils daily. 16 g 1  . levothyroxine (SYNTHROID, LEVOTHROID) 125 MCG tablet Take 1 tablet (125 mcg total) by mouth daily. 30 tablet 5  . metFORMIN (GLUCOPHAGE) 500 MG tablet TAKE 1 TABLET BY MOUTH TWICE A DAY WITH MEALS 60 tablet 1  . omeprazole (PRILOSEC) 40 MG capsule Take 1 capsule (40 mg total) by mouth daily. 30 capsule 6  . tiotropium (SPIRIVA) 18 MCG inhalation capsule Place 1 capsule (18 mcg total) into inhaler and inhale daily. 30 capsule 6  . colchicine 0.6 MG tablet Take 1 tablet (0.6 mg total) by mouth daily. 30 tablet 1   No current facility-administered medications for this visit.   Allergies  Allergen Reactions  . Budesonide-Formoterol Fumarate     REACTION: rash  . Codeine   . Cymbalta [Duloxetine Hcl]     No difference for pain or anti-depressant.   . Effexor Xr [Venlafaxine Hcl Er]  Felt more anxious or no difference.   . Fentanyl     REACTION: severe aggitation, mood changes  . Fluticasone-Salmeterol   . Neurontin [Gabapentin]     Feels like increasing pain and not effective.   . Tramadol-Acetaminophen   . Naproxen Rash     Exam:  BP 120/63 mmHg  Pulse 72  Wt 131 lb (59.421 kg) Gen: Well NAD HEENT: EOMI,  MMM Lungs: Normal work of breathing. CTABL Heart: RRR no MRG Abd: NABS, Soft. Nondistended, Nontender Exts: Brisk capillary refill, warm and well perfused.  Left wrist swollen at the dorsal radial wrist. Nontender snuff box. Nontender for nonswollen radial styloid. Wrist motion is limited by pain. Pulses capillary refill sensation are intact.  No results found for this or any previous visit (from the past 24  hour(s)). No results found.   Please see individual assessment and plan sections. This visit required moderate complexity and decision making.

## 2014-12-10 NOTE — Assessment & Plan Note (Signed)
New acute problem. moderate complexity decision-making. Likely gout. Not well controlled with NSAIDs. Start Colchicine. Obtain uric acid level. Will start uric acid lowering medications if uric acid is elevated.

## 2014-12-10 NOTE — Patient Instructions (Addendum)
Thank you for coming in today. A nurse will contact your about ordering the CT scan to screen for lung cancer.  Come back in 3 months with Jade.  Take colchicine daily. I will contact you with lab results. Consider restarting cholesterol lowering medicines. Call or go to the emergency room if you get worse, have trouble breathing, have chest pains, or palpitations.

## 2014-12-10 NOTE — Telephone Encounter (Signed)
Per referral from Rodolph Bong, MD,  I called [name] to schedule her lung cancer screening CT. She was not at home. I simply requested that she return my call, and left her my contact information. I will await her return call.

## 2014-12-10 NOTE — Assessment & Plan Note (Signed)
Patient requests screening today for lung cancer based on smoking history. Have referred to lung cancer screening program.

## 2014-12-10 NOTE — Assessment & Plan Note (Signed)
Doing well. No changes. Routine lab work obtained. Follow-up with PCP.

## 2014-12-11 ENCOUNTER — Telehealth: Payer: Self-pay | Admitting: Acute Care

## 2014-12-11 NOTE — Telephone Encounter (Signed)
I called to schedule the lung cancer screening. There was no answer. I have left another message for [name] to call me to schedule the screening Dr. Denyse Amass asked that we schedule.I left my contact info again. I will await the return call.

## 2014-12-12 ENCOUNTER — Telehealth: Payer: Self-pay | Admitting: Acute Care

## 2014-12-12 DIAGNOSIS — I10 Essential (primary) hypertension: Secondary | ICD-10-CM | POA: Diagnosis not present

## 2014-12-12 DIAGNOSIS — E119 Type 2 diabetes mellitus without complications: Secondary | ICD-10-CM | POA: Diagnosis not present

## 2014-12-12 DIAGNOSIS — E785 Hyperlipidemia, unspecified: Secondary | ICD-10-CM | POA: Diagnosis not present

## 2014-12-12 DIAGNOSIS — E039 Hypothyroidism, unspecified: Secondary | ICD-10-CM | POA: Diagnosis not present

## 2014-12-12 DIAGNOSIS — M10042 Idiopathic gout, left hand: Secondary | ICD-10-CM | POA: Diagnosis not present

## 2014-12-12 NOTE — Telephone Encounter (Signed)
I called again today to try and set this patient up for a lung cancer screening per the referral of Rodolph Bong, MD. I left a third message for the patient to return my call today, with my contact info, to schedule her screening. I will await her return call. If she does not return the call I will ask Dr. Zollie Pee office to reach out to the patient to see if they can determine if she is interested in the screening, or has decided not to participate.

## 2014-12-13 LAB — COMPLETE METABOLIC PANEL WITH GFR
ALBUMIN: 4.1 g/dL (ref 3.5–5.2)
ALT: 121 U/L — ABNORMAL HIGH (ref 0–35)
AST: 230 U/L — AB (ref 0–37)
Alkaline Phosphatase: 193 U/L — ABNORMAL HIGH (ref 39–117)
BUN: 7 mg/dL (ref 6–23)
CALCIUM: 10.2 mg/dL (ref 8.4–10.5)
CO2: 26 mEq/L (ref 19–32)
Chloride: 102 mEq/L (ref 96–112)
Creat: 0.71 mg/dL (ref 0.50–1.10)
GFR, Est African American: >89 mL/min
GFR, Est Non African American: >89 mL/min
GLUCOSE: 93 mg/dL (ref 70–99)
POTASSIUM: 4.2 meq/L (ref 3.5–5.3)
SODIUM: 140 meq/L (ref 135–145)
TOTAL PROTEIN: 7.2 g/dL (ref 6.0–8.3)
Total Bilirubin: 0.5 mg/dL (ref 0.2–1.2)

## 2014-12-13 LAB — LIPID PANEL
CHOLESTEROL: 214 mg/dL — AB (ref 0–200)
HDL: 69 mg/dL (ref >=46)
LDL CALC: 120 mg/dL — AB (ref 0–99)
Total CHOL/HDL Ratio: 3.1 Ratio
Triglycerides: 123 mg/dL (ref <150)
VLDL: 25 mg/dL (ref 0–40)

## 2014-12-13 LAB — TSH: TSH: 1.033 u[IU]/mL (ref 0.350–4.500)

## 2014-12-13 LAB — HEMOGLOBIN A1C
Hgb A1c MFr Bld: 6 % — ABNORMAL HIGH (ref <5.7)
Mean Plasma Glucose: 126 mg/dL — ABNORMAL HIGH (ref <117)

## 2014-12-13 LAB — URIC ACID: Uric Acid, Serum: 5.6 mg/dL (ref 2.4–7.0)

## 2014-12-16 ENCOUNTER — Telehealth: Payer: Self-pay | Admitting: Acute Care

## 2014-12-16 NOTE — Telephone Encounter (Signed)
[name] had returned my calls to let me know that she is going to be out of town and will call me on Tuesday to schedule her screening. I returned her call and left a message telling her that I would talk to her Tuesday, and that we will get her scheduled to screen. I will await her return call on Tuesday.

## 2014-12-17 ENCOUNTER — Telehealth: Payer: Self-pay

## 2014-12-17 ENCOUNTER — Other Ambulatory Visit: Payer: Self-pay | Admitting: Acute Care

## 2014-12-17 DIAGNOSIS — F17210 Nicotine dependence, cigarettes, uncomplicated: Secondary | ICD-10-CM

## 2014-12-17 NOTE — Telephone Encounter (Signed)
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 She is concerned about this because her mother and sister had lymphoma. She want to know what you think she should do about this. Please advise.

## 2014-12-18 NOTE — Telephone Encounter (Signed)
Patient should return to clinic to have an indepth conversation about this issue.

## 2014-12-19 ENCOUNTER — Ambulatory Visit: Payer: Medicare Other | Admitting: Family Medicine

## 2014-12-19 NOTE — Telephone Encounter (Signed)
Pt's spouse answered the phone and he was notified that pt will need an appt to discuss the concerns she wanted addressed yesterday when she called

## 2014-12-20 ENCOUNTER — Ambulatory Visit (INDEPENDENT_AMBULATORY_CARE_PROVIDER_SITE_OTHER): Payer: Medicare Other | Admitting: Family Medicine

## 2014-12-20 ENCOUNTER — Encounter: Payer: Self-pay | Admitting: Family Medicine

## 2014-12-20 VITALS — BP 136/85 | HR 91 | Wt 133.0 lb

## 2014-12-20 DIAGNOSIS — M25532 Pain in left wrist: Secondary | ICD-10-CM

## 2014-12-20 MED ORDER — PREDNISONE 5 MG (48) PO TBPK: 5.0000 mg | ORAL_TABLET | Freq: Every day | ORAL | Status: DC

## 2014-12-20 NOTE — Assessment & Plan Note (Addendum)
Left wrist pain. I believe patient has multiple pain generators in her left wrist. She clearly has significant arthritis likely osteoarthritis of the first CMC. She additionally has evidence of pseudogout in her wrist based on most recent x-rays. Additionally she has a ganglion cyst at the volar distal radius area. Discussed options. Trial of oral steroid's area if not better will proceed with CMC injection. Continue colchicine for pseudogout.

## 2014-12-20 NOTE — Patient Instructions (Signed)
Thank you for coming in today. Use the dosepack.  Take colchicine at least daily and up to twice daily.  Return for injection if not better.   Pseudogout Pseudogout is similar to gout. It is an arthritis that causes pain, swelling, and inflammation in a joint. This is due to the presence of calcium pyrophosphate crystals in the joint fluid. This is a different type of crystal than the crystals that cause gout. The joint pain can be severe and may last for days. In some cases, it may last much longer and can mimic rheumatoid arthritis or osteoarthritis. CAUSES  The exact cause of the disease is not known. It develops when crystals of calcium pyrophosphate build up in a joint. These crystals cause inflammation that leads to pain and swelling of the joint.  Chances of developing pseudogout increase with age. It often follows a minor injury.  The condition may be passed down from parent to child (hereditary).  Events such as strokes, heart attacks, or surgery may increase the risk of pseudogout.  Pseudogout can be associated with other disorders (hemophilia, ochronosis, amyloidosis, or hormonal disorders).  Pseudogout can be associated with dehydration, especially following surgery or hospitalization. Patients with known pseudogout should stay well hydrated before and after surgery. SYMPTOMS   Intense, constant pain in one joint that seems to come on for no reason.  The joint area may be hot to the touch, red, swollen, and stiff.  Pain may last from several days to a few weeks. It may then disappear. Later, it may start again, possibly in a different joint.  Pseudogout usually affects the knees. It can also affect the wrists, elbows, shoulders, and ankles. DIAGNOSIS  The diagnosis is often suggested by your exam or is suspected when an abnormal buildup of calcium salts (calcifications) are seen in the cartilage of joints on X-rays. The final diagnosis is made when fluid from the joint is  examined under a special microscope used to find calcium pyrophosphate crystals. The crystals of pseudogout and gout may both be present at the same time. TREATMENT  Nonsteroidal anti-inflammatory drugs (NSAIDs), such as naproxen, treat the pain. Identifying the trigger of pseudogout and treating the underlying cause, such as dehydration, is also important. There is no way to remove the crystals themselves. HOME CARE INSTRUCTIONS   Put ice on the sore joint.  Put ice in a plastic bag.  Place a towel between your skin and the bag.  Leave the ice on for 15-20 minutes at a time, 03-04 times a day, for the first 2 days.  Keep your affected joints raised (elevated) when possible to lessen swelling.  Use crutches (non-weight bearing) as needed. Walk without crutches as the pain allows, or as instructed. Gradually, start bearing weight.  Only take over-the-counter or prescription medicines for pain, discomfort, or fever as directed by your caregiver.  Once you recover from the painful attack, exercise regularly to keep your muscle strength. Not using a sore joint will cause the muscles around it to become weak. This may increase pain. Low-impact exercises, such as swimming, bicycling, water aerobics, and walking, may be best. This will give you energy, strengthen your heart, help you control your weight, and improve your well-being.  Maintain a healthy weight so your joints do not need to bear more weight than necessary. SEEK MEDICAL CARE IF:   You have an increase in joint pain not relieved with medicine.  You have a fever.  You have more serious symptoms such as  skin rash, diarrhea, vomiting, headache, or other joint pains. FOR MORE INFORMATION  Arthritis Foundation: www.arthritis.[facility]th[facility] and Musculoskeletal and Skin Diseases: www.niams.http://www.myers.net/ Document Released: 02/07/2004 Document Revised: 08/09/2011 Document Reviewed: 08/29/2009 [facility] Patient Information  2015 [place], [place]. This information is not intended to replace advice given to you by your health care provider. Make sure you discuss any questions you have with your health care provider.

## 2014-12-20 NOTE — Progress Notes (Signed)
[name] is a 64 y.o. female who presents to [facility]  today for follow-up left wrist pain. Patient was seen last week for left wrist pain. She was thought to have gout. She was given colchicine which she took for a few days which did not help much. Additionally she has tried a wrist brace. She notes the pain is persistent and has swelling at the dorsal radial wrist. She denies any radiating pain weakness numbness fevers or chills. She would like an injection today if possible.   Past Medical History  Diagnosis Date  . Osteoarthritis   . Cervicalgia   . Chronic pain   . Narcotic abuse     history  . Diabetes mellitus   . Thyroid disease     hypothyroid  . Hyperlipidemia   . COPD (chronic obstructive pulmonary disease)   . Osteoporosis   . Fatty liver   . DDD (degenerative disc disease), lumbar    Past Surgical History  Procedure Laterality Date  . Cholecystectomy    . Breast surgery      braest lump/ benign  . Total knee arthroplasty  2010    right  . Lumbar laminectomy  05/2010    Dr. Netta Corrigan   History  Substance Use Topics  . Smoking status: Current Every Day Smoker -- 1.00 packs/day for 43 years    Types: Cigarettes  . Smokeless tobacco: Never Used  . Alcohol Use: 2.5 oz/week    5 drink(s) per week   ROS as above Medications: Current Outpatient Prescriptions  Medication Sig Dispense Refill  . albuterol (PROVENTIL) (2.5 MG/3ML) 0.083% nebulizer solution Take 3 mLs (2.5 mg total) by nebulization every 6 (six) hours as needed for wheezing or shortness of breath. 150 mL 4  . alendronate (FOSAMAX) 70 MG tablet Take 1 tablet (70 mg total) by mouth every 7 (seven) days. Take with a full glass of water on an empty stomach. 4 tablet 11  . ALPRAZolam (XANAX) 1 MG tablet TAKE 1 TO 1 & 1/2 TABLETS BY MOUTH EVERY DAY AS NEEDED FOR ANXIETY 45 tablet 5  . chlorpheniramine-HYDROcodone (TUSSIONEX) 10-8 MG/5ML SUER Take 5 mLs by mouth every 12  (twelve) hours as needed for cough (cough, will cause drowsiness.). 120 mL 0  . citalopram (CELEXA) 20 MG tablet Take 1 tablet (20 mg total) by mouth daily. 90 tablet 0  . colchicine 0.6 MG tablet Take 1 tablet (0.6 mg total) by mouth daily. 30 tablet 1  . fluticasone (FLONASE) 50 MCG/ACT nasal spray Place 2 sprays into both nostrils daily. 16 g 1  . levothyroxine (SYNTHROID, LEVOTHROID) 125 MCG tablet Take 1 tablet (125 mcg total) by mouth daily. 30 tablet 5  . metFORMIN (GLUCOPHAGE) 500 MG tablet TAKE 1 TABLET BY MOUTH TWICE A DAY WITH MEALS 60 tablet 1  . omeprazole (PRILOSEC) 40 MG capsule Take 1 capsule (40 mg total) by mouth daily. 30 capsule 6  . tiotropium (SPIRIVA) 18 MCG inhalation capsule Place 1 capsule (18 mcg total) into inhaler and inhale daily. 30 capsule 6  . predniSONE (STERAPRED UNI-PAK 48 TAB) 5 MG (48) TBPK tablet Take 1 tablet (5 mg total) by mouth daily. 48 tablet 0   No current facility-administered medications for this visit.   Allergies  Allergen Reactions  . Budesonide-Formoterol Fumarate     REACTION: rash  . Codeine   . Cymbalta [Duloxetine Hcl]     No difference for pain or anti-depressant.   . Effexor  Xr [Venlafaxine Hcl Er]     Felt more anxious or no difference.   . Fentanyl     REACTION: severe aggitation, mood changes  . Fluticasone-Salmeterol   . Neurontin [Gabapentin]     Feels like increasing pain and not effective.   . Tramadol-Acetaminophen   . Naproxen Rash     Exam:  BP 136/85 mmHg  Pulse 91  Wt 133 lb (60.328 kg) Gen: Well NAD HEENT: EOMI,  MMM Lungs: Normal work of breathing. CTABL Heart: RRR no MRG Abd: NABS, Soft. Nondistended, Nontender Exts: Brisk capillary refill, warm and well perfused.  Left wrist swollen and tender distal radius. Pain with thumb motion. Pain with palpation at the [facility] of the thumb. Ultrasound of the left wrist shows thumb CMC effusion and joint narrowing. Patient has a hypoechoic fluid filled cyst at the  volar distal radius directly next to the radial artery. No blood flow within the cystic structure. 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   No results found for this or any previous visit (from the past 24 hour(s)). No results found.   Please see individual assessment and plan sections.

## 2014-12-23 ENCOUNTER — Other Ambulatory Visit: Payer: Self-pay | Admitting: Family Medicine

## 2014-12-25 ENCOUNTER — Ambulatory Visit (INDEPENDENT_AMBULATORY_CARE_PROVIDER_SITE_OTHER): Payer: Medicare Other | Admitting: Acute Care

## 2014-12-25 ENCOUNTER — Other Ambulatory Visit: Payer: Self-pay | Admitting: Physician Assistant

## 2014-12-25 ENCOUNTER — Encounter: Payer: Self-pay | Admitting: Acute Care

## 2014-12-25 DIAGNOSIS — F17210 Nicotine dependence, cigarettes, uncomplicated: Secondary | ICD-10-CM

## 2014-12-25 DIAGNOSIS — Z1239 Encounter for other screening for malignant neoplasm of breast: Secondary | ICD-10-CM

## 2014-12-25 DIAGNOSIS — Z1231 Encounter for screening mammogram for malignant neoplasm of breast: Secondary | ICD-10-CM

## 2014-12-25 NOTE — Progress Notes (Signed)
Shared Decision Making Visit Lung Cancer Screening Program [phone])   Eligibility:  Age 64 y.o.  Pack Years Smoking History Calculation : 43 pack year smoking history (# packs/per year x # years smoked)  Recent History of coughing up blood  no  Unexplained weight loss? no ( >Than 15 pounds within the last 6 months )  Prior History Lung / other cancer no (Diagnosis within the last 5 years already requiring surveillance chest CT Scans).  Smoking Status Current Smoker  Former Smokers: Years since quit:NA  Quit Date:NA  Visit Components:  Discussion included one or more decision making aids. yes  Discussion included risk/benefits of screening. yes  Discussion included potential follow up diagnostic testing for abnormal scans. yes  Discussion included meaning and risk of over diagnosis. yes  Discussion included meaning and risk of False Positives. yes  Discussion included meaning of total radiation exposure. yes  Counseling Included:  Importance of adherence to annual lung cancer LDCT screening. yes  Impact of comorbidities on ability to participate in the program. yes  Ability and willingness to under diagnostic treatment. yes  Smoking Cessation Counseling:  Current Smokers:   Discussed importance of smoking cessation. yes  Information about tobacco cessation classes and interventions provided to patient. yes  Patient provided with "ticket" for LDCT Scan. yes  Symptomatic Patient. no  Counseling:NA  Diagnosis Code: Tobacco Use Z72.0  Asymptomatic Patient yes  Counseling (Intermediate counseling: > three minutes counseling) V8938  Former Smokers:   Discussed the importance of maintaining cigarette abstinence.NA; current smoker  Diagnosis Code: Personal History of Nicotine Dependence. B01.751  Information about tobacco cessation classes and interventions provided to patient. Yes  Patient provided with "ticket" for LDCT Scan. yes  Written Order for  Lung Cancer Screening with LDCT placed in Epic. Yes (CT Chest Lung Cancer Screening Low Dose W/O CM) [id number] Z12.2-Screening of respiratory organs Z87.891-Personal history of nicotine dependence  I spent 15 minutes of face to face time with Mrs. Schadler explaining the risks and benefits of the lung cancer screening program.We watched a power point presentation that addressed the risks and benefits of lung cancer screening as noted in the bullets above. We stopped at intervals to allow for asking and  answering questions and to ensure complete understanding of the concepts addressed. I told Mrs. Greenlaw that the single most powerful thing that she can do to decrease her risk of lung cancer is to stop smoking. She stated that she knows this. She has tried Chantix, Wellbutrin and nicotine replacement therapy , all without success. She is currently not ready to try quitting. I gave her the " Be Stronger than your excuses" card, and encouraged her to take advantage of the community resources and support groups to assist in the goal of becoming smoke free. I have told her to call me when she is ready to try again, and that I will assist in any way I can to help  her meet this goal. She has my contact information, and said she will call for help when she is ready to try again. Additionally, I have given her a copy of the power point we viewed together for reference in the event she has questions in the future, or would like to share it with family. She is scheduled to be scanned at [facility] tomorrow( 12/26/14) at 2 pm. She verbalized understanding of time and location of the appointment, and of all of the above information in this note. She verbalized that she  will call me with any additional questions.  Bevelyn Ngo, NP

## 2014-12-26 ENCOUNTER — Ambulatory Visit (INDEPENDENT_AMBULATORY_CARE_PROVIDER_SITE_OTHER): Payer: Medicare Other

## 2014-12-26 DIAGNOSIS — I700 Atherosclerosis of aorta: Secondary | ICD-10-CM | POA: Diagnosis not present

## 2014-12-26 DIAGNOSIS — Z87891 Personal history of nicotine dependence: Secondary | ICD-10-CM | POA: Diagnosis not present

## 2014-12-26 DIAGNOSIS — Z1231 Encounter for screening mammogram for malignant neoplasm of breast: Secondary | ICD-10-CM

## 2014-12-26 DIAGNOSIS — Z1239 Encounter for other screening for malignant neoplasm of breast: Secondary | ICD-10-CM

## 2014-12-26 DIAGNOSIS — F17210 Nicotine dependence, cigarettes, uncomplicated: Secondary | ICD-10-CM

## 2014-12-27 ENCOUNTER — Telehealth: Payer: Self-pay | Admitting: Acute Care

## 2014-12-27 NOTE — Telephone Encounter (Signed)
I called to give [name] the results of her LDCT scan. There was no answer. I left a message asking her to return my call, along with my contact information. I will await her return call.

## 2014-12-30 ENCOUNTER — Telehealth: Payer: Self-pay | Admitting: Acute Care

## 2014-12-30 NOTE — Telephone Encounter (Signed)
I called [name] again today,and was able to speak with her regarding her CT screening results. I explained that her scan resulted as a Lung RADS 2: Nodules with a very low likelihood of becoming a clinically active cancer due to size or lack of growth. I explained that the recommendation is for an annual scan in 12 months. I told her that I will call her in 12 months to schedule this scan.I also told her that if her health changes, and she develops any unexplained weight loss, or starts coughing up blood to call either myself or Dr. Clementeen Graham for immediate follow up. She verbalized understanding. I told her that I would let Dr. Denyse Amass know the results of her scan. She had no further questions for me, but has my contact information in the event she has any further questions in the future.

## 2015-01-10 DIAGNOSIS — G894 Chronic pain syndrome: Secondary | ICD-10-CM | POA: Diagnosis not present

## 2015-01-10 DIAGNOSIS — M47817 Spondylosis without myelopathy or radiculopathy, lumbosacral region: Secondary | ICD-10-CM | POA: Diagnosis not present

## 2015-01-10 DIAGNOSIS — G4700 Insomnia, unspecified: Secondary | ICD-10-CM | POA: Diagnosis not present

## 2015-01-10 DIAGNOSIS — F329 Major depressive disorder, single episode, unspecified: Secondary | ICD-10-CM | POA: Diagnosis not present

## 2015-01-22 ENCOUNTER — Ambulatory Visit (INDEPENDENT_AMBULATORY_CARE_PROVIDER_SITE_OTHER): Payer: Medicare Other | Admitting: Physician Assistant

## 2015-01-22 ENCOUNTER — Encounter: Payer: Self-pay | Admitting: Physician Assistant

## 2015-01-22 VITALS — BP 118/69 | HR 90 | Wt 129.0 lb

## 2015-01-22 DIAGNOSIS — M25532 Pain in left wrist: Secondary | ICD-10-CM

## 2015-01-22 DIAGNOSIS — E785 Hyperlipidemia, unspecified: Secondary | ICD-10-CM | POA: Diagnosis not present

## 2015-01-22 MED ORDER — DICLOFENAC SODIUM 75 MG PO TBEC: 75.0000 mg | DELAYED_RELEASE_TABLET | Freq: Two times a day (BID) | ORAL | Status: DC

## 2015-01-22 MED ORDER — PREDNISONE 20 MG PO TABS: ORAL_TABLET | ORAL | Status: DC

## 2015-01-22 NOTE — Progress Notes (Signed)
   Subjective:    Patient ID: [name], female    DOB: Sep 23, 1950, 64 y.o.   MRN: [id number]  HPI Pt presents to the clinic to follow up on left wrist pain. She was seen by Dr. Denyse Amass and given round of prednisone and started colchine. She has had minimal relief. She is in a lot of pain today and having trouble using wrist. It is also become more swollen.    Review of Systems  All other systems reviewed and are negative.      Objective:   Physical Exam  Constitutional: She appears well-developed and well-nourished.  HENT:  Head: Normocephalic and atraumatic.  Cardiovascular: Normal rate, regular rhythm and normal heart sounds.   Pulmonary/Chest: Effort normal and breath sounds normal.  Musculoskeletal:  Left wrist- swollen around dorsal radial and ulnar. Tender to palpation dorsal wrist and CMC joint.[name] ROM decreased in all directions due to pain.  Pain with wrist and thumb motion.   Psychiatric: She has a normal mood and affect. Her behavior is normal.          Assessment & Plan:  Left wrist pain- discussed via xray and due to uric acid levels being normal this appears to be pseudogout. i do think next step is injection with Dr. Karie Schwalbe or Denyse Amass. Pt is in pain despite ongoing narcotics and ibuprofen use at home. She does have hx of some NSAIDs not able to be tolerated due to GI side effects. I did give another round of prednisone and given diclofenac to try for control once prednisone has been taken. Take NSAID with meals and stop with any GI side effects. Made appt for injection with Dr. Karie Schwalbe 10 Friday.   hyperlipdiemia- discussed addition of statin. Pt declined today. Discussed risk factors with pre-diabetes.

## 2015-01-23 ENCOUNTER — Encounter: Payer: Self-pay | Admitting: Physician Assistant

## 2015-01-23 ENCOUNTER — Telehealth: Payer: Self-pay

## 2015-01-23 NOTE — Telephone Encounter (Signed)
She can postpone if she like. The problem is "how long will prednisone last" the injection may last longer.

## 2015-01-23 NOTE — Telephone Encounter (Signed)
[name] called and reports no wrist pain after the start of the prednisone. Should she keep the appointment tomorrow with Dr Benjamin Stain or reschedule for a later date. Please advise.

## 2015-01-24 ENCOUNTER — Ambulatory Visit: Payer: Medicare Other | Admitting: Sports Medicine

## 2015-01-24 NOTE — Telephone Encounter (Signed)
Patient advised of recommendations. She wants to cancel appointment to see how long the pain resolves.

## 2015-02-03 ENCOUNTER — Other Ambulatory Visit: Payer: Self-pay | Admitting: Physician Assistant

## 2015-02-05 ENCOUNTER — Encounter: Payer: Self-pay | Admitting: Sports Medicine

## 2015-02-05 ENCOUNTER — Ambulatory Visit (INDEPENDENT_AMBULATORY_CARE_PROVIDER_SITE_OTHER): Payer: Medicare Other | Admitting: Sports Medicine

## 2015-02-05 VITALS — BP 169/81 | HR 81 | Wt 130.0 lb

## 2015-02-05 DIAGNOSIS — M19032 Primary osteoarthritis, left wrist: Secondary | ICD-10-CM

## 2015-02-05 NOTE — Assessment & Plan Note (Signed)
Minimal synovitis. No fluid was able to be aspirated, I did inject the wrist joint. Considering insidious onset this is unlikely to represent calcium pyrophosphate deposition disease or gout. This likely simply represents primary osteoarthritis. Uric acid levels were normal as they often are during a flare. Return in one month.

## 2015-02-05 NOTE — Progress Notes (Signed)
  Subjective:    CC: follow-up  HPI: This is a pleasant 64 year old female, she was referred to me for right wrist pain, pain is moderate, persistent. X-rays showed degenerative changes and questionable CPPD, uric acid levels were normal. Pain does not radiate. Localized at the radiocarpal joint.  Past medical history, Surgical history, Family history not pertinant except as noted below, Social history, Allergies, and medications have been entered into the medical record, reviewed, and no changes needed.   Review of Systems: No fevers, chills, night sweats, weight loss, chest pain, or shortness of breath.   Objective:    General: Well Developed, well nourished, and in no acute distress.  Neuro: Alert and oriented x3, extra-ocular muscles intact, sensation grossly intact.  HEENT: Normocephalic, atraumatic, pupils equal round reactive to light, neck supple, no masses, no lymphadenopathy, thyroid nonpalpable.  Skin: Warm and dry, no rashes. Cardiac: Regular rate and rhythm, no murmurs rubs or gallops, no lower extremity edema.  Respiratory: Clear to auscultation bilaterally. Not using accessory muscles, speaking in full sentences. Right wrist: Swollen with a palpable fluid wave and tenderness at the radiocarpal joint  Procedure: Real-time Ultrasound Guided Injection of right wrist Device: GE Logiq E  Verbal informed consent obtained.  Time-out conducted.  Noted no overlying erythema, induration, or other signs of local infection.  Skin prepped in a sterile fashion.  Local anesthesia: Topical Ethyl chloride.  With sterile technique and under real time ultrasound guidance:  Noted effusion, attended aspiration failed suggesting that this was predominantly synovitis, syringe switched and 1 mL kenalog 40, 1 mL lidocaine injected easily. Completed without difficulty  Pain immediately resolved suggesting accurate placement of the medication.  Advised to call if fevers/chills, erythema,  induration, drainage, or persistent bleeding.  Images permanently stored and available for review in the ultrasound unit.  Impression: Technically successful ultrasound guided injection.  Impression and Recommendations:

## 2015-02-11 DIAGNOSIS — G894 Chronic pain syndrome: Secondary | ICD-10-CM | POA: Diagnosis not present

## 2015-02-11 DIAGNOSIS — G4700 Insomnia, unspecified: Secondary | ICD-10-CM | POA: Diagnosis not present

## 2015-02-11 DIAGNOSIS — M47817 Spondylosis without myelopathy or radiculopathy, lumbosacral region: Secondary | ICD-10-CM | POA: Diagnosis not present

## 2015-02-11 DIAGNOSIS — F329 Major depressive disorder, single episode, unspecified: Secondary | ICD-10-CM | POA: Diagnosis not present

## 2015-02-14 ENCOUNTER — Other Ambulatory Visit: Payer: Self-pay | Admitting: Physician Assistant

## 2015-02-19 ENCOUNTER — Telehealth: Payer: Self-pay | Admitting: *Deleted

## 2015-02-19 NOTE — Telephone Encounter (Signed)
Oncology Nurse Navigator Documentation  Oncology Nurse Navigator Flowsheets 02/19/2015  Navigator Encounter Type Telephone/called patient to follow up with her smoking cessation.  I listened as she explained how she has tried to quit.  I went through some tips to help her quit.  She stated she will try these to help her quit.  I asked her to called me if needed to help quit.    Barriers/Navigation Needs Education  Education Smoking cessation  Interventions Education Method  Education Method Verbal  Time Spent with Patient 15

## 2015-03-05 ENCOUNTER — Encounter: Payer: Self-pay | Admitting: Physician Assistant

## 2015-03-05 ENCOUNTER — Ambulatory Visit (INDEPENDENT_AMBULATORY_CARE_PROVIDER_SITE_OTHER): Payer: Medicare Other | Admitting: Physician Assistant

## 2015-03-05 VITALS — BP 174/71 | HR 83 | Ht 63.0 in | Wt 129.0 lb

## 2015-03-05 DIAGNOSIS — E785 Hyperlipidemia, unspecified: Secondary | ICD-10-CM | POA: Diagnosis not present

## 2015-03-05 DIAGNOSIS — I10 Essential (primary) hypertension: Secondary | ICD-10-CM

## 2015-03-05 DIAGNOSIS — J301 Allergic rhinitis due to pollen: Secondary | ICD-10-CM

## 2015-03-05 DIAGNOSIS — E119 Type 2 diabetes mellitus without complications: Secondary | ICD-10-CM

## 2015-03-05 DIAGNOSIS — E039 Hypothyroidism, unspecified: Secondary | ICD-10-CM

## 2015-03-05 DIAGNOSIS — M19049 Primary osteoarthritis, unspecified hand: Secondary | ICD-10-CM

## 2015-03-05 DIAGNOSIS — M19032 Primary osteoarthritis, left wrist: Secondary | ICD-10-CM

## 2015-03-05 DIAGNOSIS — M1990 Unspecified osteoarthritis, unspecified site: Secondary | ICD-10-CM

## 2015-03-05 DIAGNOSIS — F411 Generalized anxiety disorder: Secondary | ICD-10-CM

## 2015-03-05 DIAGNOSIS — J449 Chronic obstructive pulmonary disease, unspecified: Secondary | ICD-10-CM

## 2015-03-05 MED ORDER — ALBUTEROL SULFATE (2.5 MG/3ML) 0.083% IN NEBU: 2.5000 mg | INHALATION_SOLUTION | Freq: Four times a day (QID) | RESPIRATORY_TRACT | Status: DC | PRN

## 2015-03-05 MED ORDER — LEVOTHYROXINE SODIUM 125 MCG PO TABS: 125.0000 ug | ORAL_TABLET | Freq: Every day | ORAL | Status: DC

## 2015-03-05 MED ORDER — METFORMIN HCL 500 MG PO TABS: 500.0000 mg | ORAL_TABLET | Freq: Two times a day (BID) | ORAL | Status: DC

## 2015-03-05 MED ORDER — DICLOFENAC SODIUM 75 MG PO TBEC: 75.0000 mg | DELAYED_RELEASE_TABLET | Freq: Two times a day (BID) | ORAL | Status: DC

## 2015-03-05 MED ORDER — TIOTROPIUM BROMIDE MONOHYDRATE 18 MCG IN CAPS: 18.0000 ug | ORAL_CAPSULE | Freq: Every day | RESPIRATORY_TRACT | Status: DC

## 2015-03-05 MED ORDER — ALPRAZOLAM 1 MG PO TABS: ORAL_TABLET | ORAL | Status: DC

## 2015-03-05 MED ORDER — CITALOPRAM HYDROBROMIDE 20 MG PO TABS: 20.0000 mg | ORAL_TABLET | Freq: Every day | ORAL | Status: DC

## 2015-03-05 MED ORDER — OMEPRAZOLE 40 MG PO CPDR: 40.0000 mg | DELAYED_RELEASE_CAPSULE | Freq: Every day | ORAL | Status: DC

## 2015-03-06 NOTE — Progress Notes (Signed)
   Subjective:    Patient ID: [name], female    DOB: 1950/08/16, 64 y.o.   MRN: [id number]  HPI Patient is a 64 year old female who presents to the clinic for medication refills. She is moving to [place] with her husband and needs a 6 month supply of all of her medicines. She goes to pain clinic for her narcotics. She will contact them regarding her pain medications.  She has no new concerns today.   Review of Systems  All other systems reviewed and are negative.      Objective:   Physical Exam  Constitutional: She is oriented to person, place, and time. She appears well-developed and well-nourished.  HENT:  Head: Normocephalic and atraumatic.  Cardiovascular: Normal rate, regular rhythm and normal heart sounds.   Pulmonary/Chest: Effort normal and breath sounds normal.  Neurological: She is alert and oriented to person, place, and time.  Skin: Skin is dry.  Psychiatric: She has a normal mood and affect. Her behavior is normal.          Assessment & Plan:  COPD-Spiriva and albuterol were refilled for 6 months today.  GERD-omeprazole refilled for 6 months.  Hypothyroidism-levothyroxin refilled for 6 months.  Diabetes mellitus, type II, controlled-metformin refilled for 6 months. Flu shot given today. She had a pneumococcal 23 vaccine on 05/31/2001. She turned 64 this year and needs the pneumonia vaccines. She declined getting the first one today.  .. Lab Results  Component Value Date   HGBA1C 6.0* 12/10/2014   Last A1c was done in July and too soon for a another test. It was controlled at 6. Follow up with new PCP.   Osteoarthritis- Diclofenac refilled for 6 months. Narcotics are filled by her pain clinic. She will follow up with them in a week.  Anxiety- Celexa and then next refill for 6 months.  Osteoporosis-Fosamax refilled for 6 months. Last DEXA was in 2014. She is in need of this. She can follow-up next year with her new PCP.  Hypertension-uncontrolled  today. Patient is in some pain. Other visits have been controlled. We'll continue to monitor. Discuss with patient goal blood pressure is 140/90 or under. Follow-up sooner with PCP if blood pressure staying elevated.

## 2015-03-12 ENCOUNTER — Encounter: Payer: Self-pay | Admitting: Physician Assistant

## 2015-03-12 DIAGNOSIS — G894 Chronic pain syndrome: Secondary | ICD-10-CM | POA: Diagnosis not present

## 2015-03-12 DIAGNOSIS — F329 Major depressive disorder, single episode, unspecified: Secondary | ICD-10-CM | POA: Diagnosis not present

## 2015-03-12 DIAGNOSIS — G4700 Insomnia, unspecified: Secondary | ICD-10-CM | POA: Diagnosis not present

## 2015-03-12 DIAGNOSIS — M47817 Spondylosis without myelopathy or radiculopathy, lumbosacral region: Secondary | ICD-10-CM | POA: Diagnosis not present

## 2015-03-14 ENCOUNTER — Telehealth: Payer: Self-pay | Admitting: Physician Assistant

## 2015-03-14 NOTE — Telephone Encounter (Signed)
Pt called, states she is going to [place] and requested a refill of her xanax to be sent to a pharmacy there for fill. Advised Pt to contact us about a week before she runs out of her Rx and we will send over the Rx to [facility] in [place]. Verbalized the correct pharmacy and added to chart before ending phone call. No further questions.

## 2015-03-21 ENCOUNTER — Other Ambulatory Visit: Payer: Self-pay | Admitting: Physician Assistant

## 2015-03-21 ENCOUNTER — Telehealth: Payer: Self-pay | Admitting: *Deleted

## 2015-03-21 MED ORDER — PREDNISONE 10 MG (21) PO TBPK: ORAL_TABLET | ORAL | Status: DC

## 2015-03-21 NOTE — Telephone Encounter (Signed)
Pt notified of rx. 

## 2015-03-21 NOTE — Telephone Encounter (Signed)
Ok will send

## 2015-03-21 NOTE — Telephone Encounter (Signed)
Pt called stating that her hand is really swollen and it's too soon for an injection.  She wanted to know if you would be willing to send her in some prednisone.  Please advise.

## 2015-04-14 DIAGNOSIS — M545 Low back pain: Secondary | ICD-10-CM | POA: Diagnosis not present

## 2015-04-14 DIAGNOSIS — M5127 Other intervertebral disc displacement, lumbosacral region: Secondary | ICD-10-CM | POA: Diagnosis not present

## 2015-04-14 DIAGNOSIS — Z79891 Long term (current) use of opiate analgesic: Secondary | ICD-10-CM | POA: Diagnosis not present

## 2015-04-14 DIAGNOSIS — M961 Postlaminectomy syndrome, not elsewhere classified: Secondary | ICD-10-CM | POA: Diagnosis not present

## 2015-04-14 DIAGNOSIS — M5417 Radiculopathy, lumbosacral region: Secondary | ICD-10-CM | POA: Diagnosis not present

## 2015-04-14 DIAGNOSIS — F4542 Pain disorder with related psychological factors: Secondary | ICD-10-CM | POA: Diagnosis not present

## 2015-05-12 DIAGNOSIS — M5417 Radiculopathy, lumbosacral region: Secondary | ICD-10-CM | POA: Diagnosis not present

## 2015-05-12 DIAGNOSIS — M961 Postlaminectomy syndrome, not elsewhere classified: Secondary | ICD-10-CM | POA: Diagnosis not present

## 2015-05-12 DIAGNOSIS — M5127 Other intervertebral disc displacement, lumbosacral region: Secondary | ICD-10-CM | POA: Diagnosis not present

## 2015-05-12 DIAGNOSIS — Z79891 Long term (current) use of opiate analgesic: Secondary | ICD-10-CM | POA: Diagnosis not present

## 2015-05-12 DIAGNOSIS — M545 Low back pain: Secondary | ICD-10-CM | POA: Diagnosis not present

## 2015-06-02 DIAGNOSIS — E039 Hypothyroidism, unspecified: Secondary | ICD-10-CM | POA: Diagnosis not present

## 2015-06-02 DIAGNOSIS — E119 Type 2 diabetes mellitus without complications: Secondary | ICD-10-CM | POA: Diagnosis not present

## 2015-06-02 DIAGNOSIS — F329 Major depressive disorder, single episode, unspecified: Secondary | ICD-10-CM | POA: Diagnosis not present

## 2015-06-12 DIAGNOSIS — M5417 Radiculopathy, lumbosacral region: Secondary | ICD-10-CM | POA: Diagnosis not present

## 2015-06-12 DIAGNOSIS — Z79891 Long term (current) use of opiate analgesic: Secondary | ICD-10-CM | POA: Diagnosis not present

## 2015-06-12 DIAGNOSIS — M5127 Other intervertebral disc displacement, lumbosacral region: Secondary | ICD-10-CM | POA: Diagnosis not present

## 2015-06-12 DIAGNOSIS — M961 Postlaminectomy syndrome, not elsewhere classified: Secondary | ICD-10-CM | POA: Diagnosis not present

## 2015-06-12 DIAGNOSIS — M545 Low back pain: Secondary | ICD-10-CM | POA: Diagnosis not present

## 2015-07-03 DIAGNOSIS — M5417 Radiculopathy, lumbosacral region: Secondary | ICD-10-CM | POA: Diagnosis not present

## 2015-07-14 DIAGNOSIS — M961 Postlaminectomy syndrome, not elsewhere classified: Secondary | ICD-10-CM | POA: Diagnosis not present

## 2015-07-14 DIAGNOSIS — F4542 Pain disorder with related psychological factors: Secondary | ICD-10-CM | POA: Diagnosis not present

## 2015-07-14 DIAGNOSIS — M5127 Other intervertebral disc displacement, lumbosacral region: Secondary | ICD-10-CM | POA: Diagnosis not present

## 2015-07-14 DIAGNOSIS — M545 Low back pain: Secondary | ICD-10-CM | POA: Diagnosis not present

## 2015-07-14 DIAGNOSIS — M5417 Radiculopathy, lumbosacral region: Secondary | ICD-10-CM | POA: Diagnosis not present

## 2015-07-14 DIAGNOSIS — Z79891 Long term (current) use of opiate analgesic: Secondary | ICD-10-CM | POA: Diagnosis not present

## 2015-07-22 DIAGNOSIS — E119 Type 2 diabetes mellitus without complications: Secondary | ICD-10-CM | POA: Diagnosis not present

## 2015-07-22 DIAGNOSIS — M549 Dorsalgia, unspecified: Secondary | ICD-10-CM | POA: Diagnosis not present

## 2015-07-22 DIAGNOSIS — E039 Hypothyroidism, unspecified: Secondary | ICD-10-CM | POA: Diagnosis not present

## 2015-07-22 DIAGNOSIS — F329 Major depressive disorder, single episode, unspecified: Secondary | ICD-10-CM | POA: Diagnosis not present

## 2015-07-22 DIAGNOSIS — F419 Anxiety disorder, unspecified: Secondary | ICD-10-CM | POA: Diagnosis not present

## 2015-08-01 ENCOUNTER — Other Ambulatory Visit: Payer: Self-pay | Admitting: Physician Assistant

## 2015-08-12 DIAGNOSIS — M5417 Radiculopathy, lumbosacral region: Secondary | ICD-10-CM | POA: Diagnosis not present

## 2015-08-12 DIAGNOSIS — S63512A Sprain of carpal joint of left wrist, initial encounter: Secondary | ICD-10-CM | POA: Diagnosis not present

## 2015-08-12 DIAGNOSIS — Z79891 Long term (current) use of opiate analgesic: Secondary | ICD-10-CM | POA: Diagnosis not present

## 2015-08-12 DIAGNOSIS — M1812 Unilateral primary osteoarthritis of first carpometacarpal joint, left hand: Secondary | ICD-10-CM | POA: Diagnosis not present

## 2015-08-19 ENCOUNTER — Other Ambulatory Visit: Payer: Self-pay | Admitting: Acute Care

## 2015-08-19 DIAGNOSIS — F17210 Nicotine dependence, cigarettes, uncomplicated: Secondary | ICD-10-CM

## 2015-08-21 DIAGNOSIS — E039 Hypothyroidism, unspecified: Secondary | ICD-10-CM | POA: Diagnosis not present

## 2015-08-21 DIAGNOSIS — F329 Major depressive disorder, single episode, unspecified: Secondary | ICD-10-CM | POA: Diagnosis not present

## 2015-08-21 DIAGNOSIS — E119 Type 2 diabetes mellitus without complications: Secondary | ICD-10-CM | POA: Diagnosis not present

## 2015-08-21 DIAGNOSIS — F419 Anxiety disorder, unspecified: Secondary | ICD-10-CM | POA: Diagnosis not present

## 2015-08-27 DIAGNOSIS — M25532 Pain in left wrist: Secondary | ICD-10-CM | POA: Diagnosis not present

## 2015-08-27 DIAGNOSIS — M19032 Primary osteoarthritis, left wrist: Secondary | ICD-10-CM | POA: Diagnosis not present

## 2015-08-27 DIAGNOSIS — M19042 Primary osteoarthritis, left hand: Secondary | ICD-10-CM | POA: Diagnosis not present

## 2015-08-27 DIAGNOSIS — M1812 Unilateral primary osteoarthritis of first carpometacarpal joint, left hand: Secondary | ICD-10-CM | POA: Diagnosis not present

## 2015-08-27 DIAGNOSIS — S63512A Sprain of carpal joint of left wrist, initial encounter: Secondary | ICD-10-CM | POA: Diagnosis not present

## 2015-09-06 DIAGNOSIS — E785 Hyperlipidemia, unspecified: Secondary | ICD-10-CM | POA: Diagnosis not present

## 2015-09-06 DIAGNOSIS — F17210 Nicotine dependence, cigarettes, uncomplicated: Secondary | ICD-10-CM | POA: Diagnosis not present

## 2015-09-06 DIAGNOSIS — W19XXXA Unspecified fall, initial encounter: Secondary | ICD-10-CM | POA: Diagnosis not present

## 2015-09-06 DIAGNOSIS — F17200 Nicotine dependence, unspecified, uncomplicated: Secondary | ICD-10-CM | POA: Diagnosis not present

## 2015-09-06 DIAGNOSIS — Z043 Encounter for examination and observation following other accident: Secondary | ICD-10-CM | POA: Diagnosis not present

## 2015-09-06 DIAGNOSIS — M8588 Other specified disorders of bone density and structure, other site: Secondary | ICD-10-CM | POA: Diagnosis not present

## 2015-09-06 DIAGNOSIS — I340 Nonrheumatic mitral (valve) insufficiency: Secondary | ICD-10-CM | POA: Diagnosis not present

## 2015-09-06 DIAGNOSIS — Z8249 Family history of ischemic heart disease and other diseases of the circulatory system: Secondary | ICD-10-CM | POA: Diagnosis not present

## 2015-09-06 DIAGNOSIS — Z79891 Long term (current) use of opiate analgesic: Secondary | ICD-10-CM | POA: Diagnosis not present

## 2015-09-06 DIAGNOSIS — R079 Chest pain, unspecified: Secondary | ICD-10-CM | POA: Diagnosis not present

## 2015-09-06 DIAGNOSIS — M47812 Spondylosis without myelopathy or radiculopathy, cervical region: Secondary | ICD-10-CM | POA: Diagnosis not present

## 2015-09-06 DIAGNOSIS — Z716 Tobacco abuse counseling: Secondary | ICD-10-CM | POA: Diagnosis not present

## 2015-09-06 DIAGNOSIS — R9431 Abnormal electrocardiogram [ECG] [EKG]: Secondary | ICD-10-CM | POA: Diagnosis not present

## 2015-09-06 DIAGNOSIS — I214 Non-ST elevation (NSTEMI) myocardial infarction: Secondary | ICD-10-CM | POA: Diagnosis not present

## 2015-09-06 DIAGNOSIS — S29012A Strain of muscle and tendon of back wall of thorax, initial encounter: Secondary | ICD-10-CM | POA: Diagnosis not present

## 2015-09-06 DIAGNOSIS — Z9851 Tubal ligation status: Secondary | ICD-10-CM | POA: Diagnosis not present

## 2015-09-06 DIAGNOSIS — I2510 Atherosclerotic heart disease of native coronary artery without angina pectoris: Secondary | ICD-10-CM | POA: Diagnosis not present

## 2015-09-06 DIAGNOSIS — I2109 ST elevation (STEMI) myocardial infarction involving other coronary artery of anterior wall: Secondary | ICD-10-CM | POA: Diagnosis not present

## 2015-09-06 DIAGNOSIS — Z7984 Long term (current) use of oral hypoglycemic drugs: Secondary | ICD-10-CM | POA: Diagnosis not present

## 2015-09-06 DIAGNOSIS — E119 Type 2 diabetes mellitus without complications: Secondary | ICD-10-CM | POA: Diagnosis not present

## 2015-09-06 DIAGNOSIS — M546 Pain in thoracic spine: Secondary | ICD-10-CM | POA: Diagnosis not present

## 2015-09-06 DIAGNOSIS — G8929 Other chronic pain: Secondary | ICD-10-CM | POA: Diagnosis not present

## 2015-09-06 DIAGNOSIS — Z96659 Presence of unspecified artificial knee joint: Secondary | ICD-10-CM | POA: Diagnosis not present

## 2015-09-07 DIAGNOSIS — I2510 Atherosclerotic heart disease of native coronary artery without angina pectoris: Secondary | ICD-10-CM | POA: Diagnosis not present

## 2015-09-07 DIAGNOSIS — I214 Non-ST elevation (NSTEMI) myocardial infarction: Secondary | ICD-10-CM | POA: Diagnosis not present

## 2015-09-07 DIAGNOSIS — W19XXXA Unspecified fall, initial encounter: Secondary | ICD-10-CM | POA: Diagnosis not present

## 2015-09-07 DIAGNOSIS — I340 Nonrheumatic mitral (valve) insufficiency: Secondary | ICD-10-CM | POA: Diagnosis not present

## 2015-09-07 DIAGNOSIS — E785 Hyperlipidemia, unspecified: Secondary | ICD-10-CM | POA: Diagnosis not present

## 2015-09-07 DIAGNOSIS — I517 Cardiomegaly: Secondary | ICD-10-CM | POA: Diagnosis not present

## 2015-09-07 DIAGNOSIS — S29012A Strain of muscle and tendon of back wall of thorax, initial encounter: Secondary | ICD-10-CM | POA: Diagnosis not present

## 2015-09-07 DIAGNOSIS — Z9861 Coronary angioplasty status: Secondary | ICD-10-CM | POA: Diagnosis not present

## 2015-09-07 DIAGNOSIS — F17200 Nicotine dependence, unspecified, uncomplicated: Secondary | ICD-10-CM | POA: Diagnosis not present

## 2015-09-07 DIAGNOSIS — I2109 ST elevation (STEMI) myocardial infarction involving other coronary artery of anterior wall: Secondary | ICD-10-CM | POA: Diagnosis not present

## 2015-09-08 DIAGNOSIS — M5127 Other intervertebral disc displacement, lumbosacral region: Secondary | ICD-10-CM | POA: Diagnosis not present

## 2015-09-08 DIAGNOSIS — M1812 Unilateral primary osteoarthritis of first carpometacarpal joint, left hand: Secondary | ICD-10-CM | POA: Diagnosis not present

## 2015-09-08 DIAGNOSIS — Z79891 Long term (current) use of opiate analgesic: Secondary | ICD-10-CM | POA: Diagnosis not present

## 2015-09-08 DIAGNOSIS — M545 Low back pain: Secondary | ICD-10-CM | POA: Diagnosis not present

## 2015-09-08 DIAGNOSIS — M5417 Radiculopathy, lumbosacral region: Secondary | ICD-10-CM | POA: Diagnosis not present

## 2015-09-08 DIAGNOSIS — M961 Postlaminectomy syndrome, not elsewhere classified: Secondary | ICD-10-CM | POA: Diagnosis not present

## 2015-09-11 DIAGNOSIS — F419 Anxiety disorder, unspecified: Secondary | ICD-10-CM | POA: Diagnosis not present

## 2015-09-11 DIAGNOSIS — I2510 Atherosclerotic heart disease of native coronary artery without angina pectoris: Secondary | ICD-10-CM | POA: Diagnosis not present

## 2015-10-02 DIAGNOSIS — M545 Low back pain: Secondary | ICD-10-CM | POA: Diagnosis not present

## 2015-10-02 DIAGNOSIS — Z79891 Long term (current) use of opiate analgesic: Secondary | ICD-10-CM | POA: Diagnosis not present

## 2015-10-02 DIAGNOSIS — R252 Cramp and spasm: Secondary | ICD-10-CM | POA: Diagnosis not present

## 2015-10-03 DIAGNOSIS — I2510 Atherosclerotic heart disease of native coronary artery without angina pectoris: Secondary | ICD-10-CM | POA: Diagnosis not present

## 2015-10-03 DIAGNOSIS — E039 Hypothyroidism, unspecified: Secondary | ICD-10-CM | POA: Diagnosis not present

## 2015-10-03 DIAGNOSIS — F329 Major depressive disorder, single episode, unspecified: Secondary | ICD-10-CM | POA: Diagnosis not present

## 2015-10-03 DIAGNOSIS — I252 Old myocardial infarction: Secondary | ICD-10-CM | POA: Diagnosis not present

## 2015-10-03 DIAGNOSIS — E119 Type 2 diabetes mellitus without complications: Secondary | ICD-10-CM | POA: Diagnosis not present

## 2015-10-03 DIAGNOSIS — E1151 Type 2 diabetes mellitus with diabetic peripheral angiopathy without gangrene: Secondary | ICD-10-CM | POA: Diagnosis not present

## 2015-10-09 DIAGNOSIS — M545 Low back pain: Secondary | ICD-10-CM | POA: Diagnosis not present

## 2015-10-09 DIAGNOSIS — M5127 Other intervertebral disc displacement, lumbosacral region: Secondary | ICD-10-CM | POA: Diagnosis not present

## 2015-10-09 DIAGNOSIS — M5417 Radiculopathy, lumbosacral region: Secondary | ICD-10-CM | POA: Diagnosis not present

## 2015-10-09 DIAGNOSIS — Z79891 Long term (current) use of opiate analgesic: Secondary | ICD-10-CM | POA: Diagnosis not present

## 2015-10-09 DIAGNOSIS — M1812 Unilateral primary osteoarthritis of first carpometacarpal joint, left hand: Secondary | ICD-10-CM | POA: Diagnosis not present

## 2015-10-09 DIAGNOSIS — M961 Postlaminectomy syndrome, not elsewhere classified: Secondary | ICD-10-CM | POA: Diagnosis not present

## 2015-10-10 DIAGNOSIS — I214 Non-ST elevation (NSTEMI) myocardial infarction: Secondary | ICD-10-CM | POA: Diagnosis not present

## 2015-10-10 DIAGNOSIS — I2510 Atherosclerotic heart disease of native coronary artery without angina pectoris: Secondary | ICD-10-CM | POA: Diagnosis not present

## 2015-10-10 DIAGNOSIS — Z720 Tobacco use: Secondary | ICD-10-CM | POA: Diagnosis not present

## 2015-10-10 DIAGNOSIS — I252 Old myocardial infarction: Secondary | ICD-10-CM | POA: Diagnosis not present

## 2015-10-21 DIAGNOSIS — R238 Other skin changes: Secondary | ICD-10-CM | POA: Diagnosis not present

## 2015-10-21 DIAGNOSIS — M549 Dorsalgia, unspecified: Secondary | ICD-10-CM | POA: Diagnosis not present

## 2015-10-21 DIAGNOSIS — F419 Anxiety disorder, unspecified: Secondary | ICD-10-CM | POA: Diagnosis not present

## 2015-10-21 DIAGNOSIS — E1151 Type 2 diabetes mellitus with diabetic peripheral angiopathy without gangrene: Secondary | ICD-10-CM | POA: Diagnosis not present

## 2015-11-06 DIAGNOSIS — F329 Major depressive disorder, single episode, unspecified: Secondary | ICD-10-CM | POA: Diagnosis not present

## 2015-11-06 DIAGNOSIS — F419 Anxiety disorder, unspecified: Secondary | ICD-10-CM | POA: Diagnosis not present

## 2015-11-06 DIAGNOSIS — E119 Type 2 diabetes mellitus without complications: Secondary | ICD-10-CM | POA: Diagnosis not present

## 2015-11-27 DIAGNOSIS — Z0389 Encounter for observation for other suspected diseases and conditions ruled out: Secondary | ICD-10-CM | POA: Diagnosis not present

## 2015-11-27 DIAGNOSIS — Z79891 Long term (current) use of opiate analgesic: Secondary | ICD-10-CM | POA: Diagnosis not present

## 2015-12-01 DIAGNOSIS — Z720 Tobacco use: Secondary | ICD-10-CM | POA: Diagnosis not present

## 2015-12-01 DIAGNOSIS — Z955 Presence of coronary angioplasty implant and graft: Secondary | ICD-10-CM | POA: Diagnosis not present

## 2015-12-01 DIAGNOSIS — I2510 Atherosclerotic heart disease of native coronary artery without angina pectoris: Secondary | ICD-10-CM | POA: Diagnosis not present

## 2015-12-01 DIAGNOSIS — L989 Disorder of the skin and subcutaneous tissue, unspecified: Secondary | ICD-10-CM | POA: Diagnosis not present

## 2015-12-01 DIAGNOSIS — Z0389 Encounter for observation for other suspected diseases and conditions ruled out: Secondary | ICD-10-CM | POA: Diagnosis not present

## 2015-12-01 DIAGNOSIS — I10 Essential (primary) hypertension: Secondary | ICD-10-CM | POA: Diagnosis not present

## 2015-12-01 DIAGNOSIS — Z79891 Long term (current) use of opiate analgesic: Secondary | ICD-10-CM | POA: Diagnosis not present

## 2015-12-01 DIAGNOSIS — E039 Hypothyroidism, unspecified: Secondary | ICD-10-CM | POA: Insufficient documentation

## 2015-12-01 DIAGNOSIS — E782 Mixed hyperlipidemia: Secondary | ICD-10-CM | POA: Diagnosis not present

## 2015-12-23 DIAGNOSIS — M5136 Other intervertebral disc degeneration, lumbar region: Secondary | ICD-10-CM | POA: Diagnosis not present

## 2015-12-23 DIAGNOSIS — M545 Low back pain: Secondary | ICD-10-CM | POA: Diagnosis not present

## 2015-12-23 DIAGNOSIS — M47816 Spondylosis without myelopathy or radiculopathy, lumbar region: Secondary | ICD-10-CM | POA: Diagnosis not present

## 2015-12-23 DIAGNOSIS — M533 Sacrococcygeal disorders, not elsewhere classified: Secondary | ICD-10-CM | POA: Diagnosis not present

## 2016-01-07 DIAGNOSIS — L821 Other seborrheic keratosis: Secondary | ICD-10-CM | POA: Diagnosis not present

## 2016-01-23 DIAGNOSIS — Z1211 Encounter for screening for malignant neoplasm of colon: Secondary | ICD-10-CM | POA: Diagnosis not present

## 2016-01-23 DIAGNOSIS — G629 Polyneuropathy, unspecified: Secondary | ICD-10-CM | POA: Diagnosis not present

## 2016-01-23 DIAGNOSIS — Z0000 Encounter for general adult medical examination without abnormal findings: Secondary | ICD-10-CM | POA: Diagnosis not present

## 2016-02-09 DIAGNOSIS — M47817 Spondylosis without myelopathy or radiculopathy, lumbosacral region: Secondary | ICD-10-CM | POA: Diagnosis not present

## 2016-02-09 DIAGNOSIS — M4806 Spinal stenosis, lumbar region: Secondary | ICD-10-CM | POA: Diagnosis not present

## 2016-02-17 DIAGNOSIS — H2513 Age-related nuclear cataract, bilateral: Secondary | ICD-10-CM | POA: Diagnosis not present

## 2016-02-17 DIAGNOSIS — H25013 Cortical age-related cataract, bilateral: Secondary | ICD-10-CM | POA: Diagnosis not present

## 2016-02-17 DIAGNOSIS — E119 Type 2 diabetes mellitus without complications: Secondary | ICD-10-CM | POA: Diagnosis not present

## 2016-02-24 DIAGNOSIS — M4726 Other spondylosis with radiculopathy, lumbar region: Secondary | ICD-10-CM | POA: Diagnosis not present

## 2016-02-24 DIAGNOSIS — M419 Scoliosis, unspecified: Secondary | ICD-10-CM | POA: Diagnosis not present

## 2016-02-27 DIAGNOSIS — F419 Anxiety disorder, unspecified: Secondary | ICD-10-CM | POA: Diagnosis not present

## 2016-02-27 DIAGNOSIS — R42 Dizziness and giddiness: Secondary | ICD-10-CM | POA: Diagnosis not present

## 2016-02-27 DIAGNOSIS — M5136 Other intervertebral disc degeneration, lumbar region: Secondary | ICD-10-CM | POA: Insufficient documentation

## 2016-02-27 DIAGNOSIS — M51369 Other intervertebral disc degeneration, lumbar region without mention of lumbar back pain or lower extremity pain: Secondary | ICD-10-CM | POA: Insufficient documentation

## 2016-02-27 DIAGNOSIS — G8929 Other chronic pain: Secondary | ICD-10-CM | POA: Insufficient documentation

## 2016-02-27 DIAGNOSIS — R5383 Other fatigue: Secondary | ICD-10-CM | POA: Diagnosis not present

## 2016-02-27 DIAGNOSIS — M545 Low back pain: Secondary | ICD-10-CM | POA: Diagnosis not present

## 2016-02-27 DIAGNOSIS — M549 Dorsalgia, unspecified: Secondary | ICD-10-CM

## 2016-02-27 DIAGNOSIS — M5126 Other intervertebral disc displacement, lumbar region: Secondary | ICD-10-CM | POA: Insufficient documentation

## 2016-02-27 LAB — BASIC METABOLIC PANEL
BUN: 11 (ref 4–21)
Glucose: 131

## 2016-03-04 DIAGNOSIS — M48061 Spinal stenosis, lumbar region without neurogenic claudication: Secondary | ICD-10-CM | POA: Diagnosis not present

## 2016-03-16 DIAGNOSIS — E782 Mixed hyperlipidemia: Secondary | ICD-10-CM | POA: Diagnosis not present

## 2016-03-16 DIAGNOSIS — G8929 Other chronic pain: Secondary | ICD-10-CM | POA: Diagnosis not present

## 2016-03-16 DIAGNOSIS — I2510 Atherosclerotic heart disease of native coronary artery without angina pectoris: Secondary | ICD-10-CM | POA: Insufficient documentation

## 2016-03-16 DIAGNOSIS — I10 Essential (primary) hypertension: Secondary | ICD-10-CM | POA: Diagnosis not present

## 2016-03-16 DIAGNOSIS — M549 Dorsalgia, unspecified: Secondary | ICD-10-CM | POA: Diagnosis not present

## 2016-03-22 DIAGNOSIS — M47817 Spondylosis without myelopathy or radiculopathy, lumbosacral region: Secondary | ICD-10-CM | POA: Diagnosis not present

## 2016-03-24 DIAGNOSIS — M25552 Pain in left hip: Secondary | ICD-10-CM | POA: Diagnosis not present

## 2016-03-24 DIAGNOSIS — M4186 Other forms of scoliosis, lumbar region: Secondary | ICD-10-CM | POA: Diagnosis not present

## 2016-03-24 DIAGNOSIS — M47816 Spondylosis without myelopathy or radiculopathy, lumbar region: Secondary | ICD-10-CM | POA: Diagnosis not present

## 2016-03-24 DIAGNOSIS — M5136 Other intervertebral disc degeneration, lumbar region: Secondary | ICD-10-CM | POA: Diagnosis not present

## 2016-03-24 DIAGNOSIS — M545 Low back pain: Secondary | ICD-10-CM | POA: Diagnosis not present

## 2016-03-24 DIAGNOSIS — M47817 Spondylosis without myelopathy or radiculopathy, lumbosacral region: Secondary | ICD-10-CM | POA: Diagnosis not present

## 2016-03-24 DIAGNOSIS — M48061 Spinal stenosis, lumbar region without neurogenic claudication: Secondary | ICD-10-CM | POA: Diagnosis not present

## 2016-03-24 DIAGNOSIS — M25551 Pain in right hip: Secondary | ICD-10-CM | POA: Diagnosis not present

## 2016-03-24 DIAGNOSIS — M62830 Muscle spasm of back: Secondary | ICD-10-CM | POA: Diagnosis not present

## 2016-03-30 DIAGNOSIS — M19041 Primary osteoarthritis, right hand: Secondary | ICD-10-CM | POA: Diagnosis not present

## 2016-03-30 DIAGNOSIS — M19042 Primary osteoarthritis, left hand: Secondary | ICD-10-CM | POA: Diagnosis not present

## 2016-04-07 DIAGNOSIS — Z79899 Other long term (current) drug therapy: Secondary | ICD-10-CM | POA: Diagnosis not present

## 2016-04-07 DIAGNOSIS — M47817 Spondylosis without myelopathy or radiculopathy, lumbosacral region: Secondary | ICD-10-CM | POA: Diagnosis not present

## 2016-04-07 DIAGNOSIS — M545 Low back pain: Secondary | ICD-10-CM | POA: Diagnosis not present

## 2016-04-07 DIAGNOSIS — Z79891 Long term (current) use of opiate analgesic: Secondary | ICD-10-CM | POA: Diagnosis not present

## 2016-04-08 ENCOUNTER — Other Ambulatory Visit: Payer: Self-pay | Admitting: Physician Assistant

## 2016-04-21 DIAGNOSIS — Z79899 Other long term (current) drug therapy: Secondary | ICD-10-CM | POA: Diagnosis not present

## 2016-04-21 DIAGNOSIS — M47817 Spondylosis without myelopathy or radiculopathy, lumbosacral region: Secondary | ICD-10-CM | POA: Diagnosis not present

## 2016-04-21 DIAGNOSIS — Z79891 Long term (current) use of opiate analgesic: Secondary | ICD-10-CM | POA: Diagnosis not present

## 2016-04-25 ENCOUNTER — Other Ambulatory Visit: Payer: Self-pay | Admitting: Physician Assistant

## 2016-04-26 ENCOUNTER — Other Ambulatory Visit: Payer: Self-pay | Admitting: Physician Assistant

## 2016-04-27 DIAGNOSIS — E119 Type 2 diabetes mellitus without complications: Secondary | ICD-10-CM | POA: Diagnosis not present

## 2016-05-04 DIAGNOSIS — M25512 Pain in left shoulder: Secondary | ICD-10-CM | POA: Diagnosis not present

## 2016-05-05 DIAGNOSIS — M47817 Spondylosis without myelopathy or radiculopathy, lumbosacral region: Secondary | ICD-10-CM | POA: Diagnosis not present

## 2016-05-07 DIAGNOSIS — M2550 Pain in unspecified joint: Secondary | ICD-10-CM | POA: Diagnosis not present

## 2016-05-07 DIAGNOSIS — M549 Dorsalgia, unspecified: Secondary | ICD-10-CM | POA: Diagnosis not present

## 2016-05-07 DIAGNOSIS — F17210 Nicotine dependence, cigarettes, uncomplicated: Secondary | ICD-10-CM | POA: Diagnosis not present

## 2016-05-07 DIAGNOSIS — R768 Other specified abnormal immunological findings in serum: Secondary | ICD-10-CM | POA: Diagnosis not present

## 2016-05-07 DIAGNOSIS — M79641 Pain in right hand: Secondary | ICD-10-CM | POA: Diagnosis not present

## 2016-05-07 DIAGNOSIS — M79642 Pain in left hand: Secondary | ICD-10-CM | POA: Diagnosis not present

## 2016-05-07 DIAGNOSIS — M130 Polyarthritis, unspecified: Secondary | ICD-10-CM | POA: Diagnosis not present

## 2016-05-25 ENCOUNTER — Encounter: Payer: Self-pay | Admitting: Physician Assistant

## 2016-05-28 DIAGNOSIS — Z23 Encounter for immunization: Secondary | ICD-10-CM | POA: Diagnosis not present

## 2016-05-28 DIAGNOSIS — M25512 Pain in left shoulder: Secondary | ICD-10-CM | POA: Diagnosis not present

## 2016-05-28 DIAGNOSIS — G8929 Other chronic pain: Secondary | ICD-10-CM | POA: Diagnosis not present

## 2016-06-02 DIAGNOSIS — M7502 Adhesive capsulitis of left shoulder: Secondary | ICD-10-CM | POA: Diagnosis not present

## 2016-06-02 DIAGNOSIS — M25512 Pain in left shoulder: Secondary | ICD-10-CM | POA: Diagnosis not present

## 2016-06-14 DIAGNOSIS — M159 Polyosteoarthritis, unspecified: Secondary | ICD-10-CM | POA: Diagnosis not present

## 2016-06-17 DIAGNOSIS — F17210 Nicotine dependence, cigarettes, uncomplicated: Secondary | ICD-10-CM | POA: Diagnosis not present

## 2016-06-17 DIAGNOSIS — Z1589 Genetic susceptibility to other disease: Secondary | ICD-10-CM | POA: Diagnosis not present

## 2016-06-17 DIAGNOSIS — R7989 Other specified abnormal findings of blood chemistry: Secondary | ICD-10-CM | POA: Diagnosis not present

## 2016-06-17 DIAGNOSIS — Z5181 Encounter for therapeutic drug level monitoring: Secondary | ICD-10-CM | POA: Diagnosis not present

## 2016-06-17 DIAGNOSIS — M5416 Radiculopathy, lumbar region: Secondary | ICD-10-CM | POA: Diagnosis not present

## 2016-06-17 DIAGNOSIS — M2550 Pain in unspecified joint: Secondary | ICD-10-CM | POA: Diagnosis not present

## 2016-06-17 DIAGNOSIS — Z79899 Other long term (current) drug therapy: Secondary | ICD-10-CM | POA: Diagnosis not present

## 2016-06-17 DIAGNOSIS — R768 Other specified abnormal immunological findings in serum: Secondary | ICD-10-CM | POA: Diagnosis not present

## 2016-07-01 DIAGNOSIS — Z1589 Genetic susceptibility to other disease: Secondary | ICD-10-CM | POA: Diagnosis not present

## 2016-07-01 DIAGNOSIS — R102 Pelvic and perineal pain: Secondary | ICD-10-CM | POA: Diagnosis not present

## 2016-07-01 DIAGNOSIS — R768 Other specified abnormal immunological findings in serum: Secondary | ICD-10-CM | POA: Diagnosis not present

## 2016-07-01 DIAGNOSIS — M47817 Spondylosis without myelopathy or radiculopathy, lumbosacral region: Secondary | ICD-10-CM | POA: Diagnosis not present

## 2016-07-06 DIAGNOSIS — Z720 Tobacco use: Secondary | ICD-10-CM | POA: Diagnosis not present

## 2016-07-06 DIAGNOSIS — M069 Rheumatoid arthritis, unspecified: Secondary | ICD-10-CM | POA: Insufficient documentation

## 2016-07-06 DIAGNOSIS — G4700 Insomnia, unspecified: Secondary | ICD-10-CM | POA: Diagnosis not present

## 2016-07-29 DIAGNOSIS — F17210 Nicotine dependence, cigarettes, uncomplicated: Secondary | ICD-10-CM | POA: Diagnosis not present

## 2016-07-29 DIAGNOSIS — Z1589 Genetic susceptibility to other disease: Secondary | ICD-10-CM | POA: Diagnosis not present

## 2016-07-29 DIAGNOSIS — Z79899 Other long term (current) drug therapy: Secondary | ICD-10-CM | POA: Diagnosis not present

## 2016-07-29 DIAGNOSIS — M059 Rheumatoid arthritis with rheumatoid factor, unspecified: Secondary | ICD-10-CM | POA: Diagnosis not present

## 2016-08-06 DIAGNOSIS — Z79899 Other long term (current) drug therapy: Secondary | ICD-10-CM | POA: Diagnosis not present

## 2016-08-06 DIAGNOSIS — Z5181 Encounter for therapeutic drug level monitoring: Secondary | ICD-10-CM | POA: Diagnosis not present

## 2016-08-17 DIAGNOSIS — M5416 Radiculopathy, lumbar region: Secondary | ICD-10-CM | POA: Diagnosis not present

## 2016-08-25 DIAGNOSIS — M533 Sacrococcygeal disorders, not elsewhere classified: Secondary | ICD-10-CM | POA: Diagnosis not present

## 2016-08-30 DIAGNOSIS — M069 Rheumatoid arthritis, unspecified: Secondary | ICD-10-CM | POA: Diagnosis not present

## 2016-09-01 DIAGNOSIS — M25552 Pain in left hip: Secondary | ICD-10-CM | POA: Diagnosis not present

## 2016-09-01 DIAGNOSIS — M47816 Spondylosis without myelopathy or radiculopathy, lumbar region: Secondary | ICD-10-CM | POA: Diagnosis not present

## 2016-09-02 DIAGNOSIS — M059 Rheumatoid arthritis with rheumatoid factor, unspecified: Secondary | ICD-10-CM | POA: Diagnosis not present

## 2016-09-02 DIAGNOSIS — Z1589 Genetic susceptibility to other disease: Secondary | ICD-10-CM | POA: Diagnosis not present

## 2016-09-02 DIAGNOSIS — Z79899 Other long term (current) drug therapy: Secondary | ICD-10-CM | POA: Diagnosis not present

## 2016-09-02 DIAGNOSIS — F17210 Nicotine dependence, cigarettes, uncomplicated: Secondary | ICD-10-CM | POA: Diagnosis not present

## 2016-09-06 DIAGNOSIS — M5432 Sciatica, left side: Secondary | ICD-10-CM | POA: Diagnosis not present

## 2016-09-06 DIAGNOSIS — M5416 Radiculopathy, lumbar region: Secondary | ICD-10-CM | POA: Diagnosis not present

## 2016-09-14 DIAGNOSIS — I10 Essential (primary) hypertension: Secondary | ICD-10-CM | POA: Diagnosis not present

## 2016-09-14 DIAGNOSIS — E782 Mixed hyperlipidemia: Secondary | ICD-10-CM | POA: Diagnosis not present

## 2016-09-14 DIAGNOSIS — I2510 Atherosclerotic heart disease of native coronary artery without angina pectoris: Secondary | ICD-10-CM | POA: Diagnosis not present

## 2016-09-20 DIAGNOSIS — M5432 Sciatica, left side: Secondary | ICD-10-CM | POA: Diagnosis not present

## 2016-09-20 DIAGNOSIS — M5416 Radiculopathy, lumbar region: Secondary | ICD-10-CM | POA: Diagnosis not present

## 2016-09-21 DIAGNOSIS — I2510 Atherosclerotic heart disease of native coronary artery without angina pectoris: Secondary | ICD-10-CM | POA: Diagnosis not present

## 2016-09-21 DIAGNOSIS — E782 Mixed hyperlipidemia: Secondary | ICD-10-CM | POA: Diagnosis not present

## 2016-09-21 DIAGNOSIS — E039 Hypothyroidism, unspecified: Secondary | ICD-10-CM | POA: Diagnosis not present

## 2016-09-21 DIAGNOSIS — I10 Essential (primary) hypertension: Secondary | ICD-10-CM | POA: Diagnosis not present

## 2016-09-21 DIAGNOSIS — M069 Rheumatoid arthritis, unspecified: Secondary | ICD-10-CM | POA: Diagnosis not present

## 2016-09-21 DIAGNOSIS — F339 Major depressive disorder, recurrent, unspecified: Secondary | ICD-10-CM | POA: Diagnosis not present

## 2016-09-21 DIAGNOSIS — R5383 Other fatigue: Secondary | ICD-10-CM | POA: Diagnosis not present

## 2016-09-21 DIAGNOSIS — E118 Type 2 diabetes mellitus with unspecified complications: Secondary | ICD-10-CM | POA: Diagnosis not present

## 2016-09-23 DIAGNOSIS — E118 Type 2 diabetes mellitus with unspecified complications: Secondary | ICD-10-CM | POA: Diagnosis not present

## 2016-09-23 DIAGNOSIS — R5383 Other fatigue: Secondary | ICD-10-CM | POA: Diagnosis not present

## 2016-09-23 LAB — LIPID PANEL
CHOLESTEROL: 266 — AB (ref 0–200)
HDL: 84 — AB (ref 35–70)
LDL Cholesterol: 0
TRIGLYCERIDES: 401 — AB (ref 40–160)

## 2016-09-23 LAB — TSH: TSH: 2.49 (ref <=5.90)

## 2016-09-27 DIAGNOSIS — M5432 Sciatica, left side: Secondary | ICD-10-CM | POA: Diagnosis not present

## 2016-09-27 DIAGNOSIS — M5416 Radiculopathy, lumbar region: Secondary | ICD-10-CM | POA: Diagnosis not present

## 2016-09-27 DIAGNOSIS — M5431 Sciatica, right side: Secondary | ICD-10-CM | POA: Diagnosis not present

## 2016-10-01 DIAGNOSIS — M48062 Spinal stenosis, lumbar region with neurogenic claudication: Secondary | ICD-10-CM | POA: Diagnosis not present

## 2016-10-01 DIAGNOSIS — M47817 Spondylosis without myelopathy or radiculopathy, lumbosacral region: Secondary | ICD-10-CM | POA: Diagnosis not present

## 2016-10-03 ENCOUNTER — Other Ambulatory Visit: Payer: Self-pay | Admitting: Physician Assistant

## 2016-10-05 DIAGNOSIS — F17210 Nicotine dependence, cigarettes, uncomplicated: Secondary | ICD-10-CM | POA: Diagnosis not present

## 2016-10-05 DIAGNOSIS — M059 Rheumatoid arthritis with rheumatoid factor, unspecified: Secondary | ICD-10-CM | POA: Diagnosis not present

## 2016-10-05 DIAGNOSIS — Z79899 Other long term (current) drug therapy: Secondary | ICD-10-CM | POA: Diagnosis not present

## 2016-10-08 DIAGNOSIS — M47817 Spondylosis without myelopathy or radiculopathy, lumbosacral region: Secondary | ICD-10-CM | POA: Diagnosis not present

## 2016-10-11 DIAGNOSIS — M069 Rheumatoid arthritis, unspecified: Secondary | ICD-10-CM | POA: Diagnosis not present

## 2016-10-11 DIAGNOSIS — Z79899 Other long term (current) drug therapy: Secondary | ICD-10-CM | POA: Diagnosis not present

## 2016-10-11 DIAGNOSIS — M059 Rheumatoid arthritis with rheumatoid factor, unspecified: Secondary | ICD-10-CM | POA: Diagnosis not present

## 2016-10-11 DIAGNOSIS — M25512 Pain in left shoulder: Secondary | ICD-10-CM | POA: Diagnosis not present

## 2016-10-11 LAB — HEPATIC FUNCTION PANEL
ALT: 22 (ref 7–35)
AST: 22 (ref 13–35)

## 2016-10-11 LAB — BASIC METABOLIC PANEL: CREATININE: 0.9 (ref <=1.1)

## 2016-10-20 DIAGNOSIS — Z1231 Encounter for screening mammogram for malignant neoplasm of breast: Secondary | ICD-10-CM | POA: Diagnosis not present

## 2016-10-26 DIAGNOSIS — J029 Acute pharyngitis, unspecified: Secondary | ICD-10-CM | POA: Diagnosis not present

## 2016-10-26 DIAGNOSIS — J069 Acute upper respiratory infection, unspecified: Secondary | ICD-10-CM | POA: Diagnosis not present

## 2016-10-26 DIAGNOSIS — L03317 Cellulitis of buttock: Secondary | ICD-10-CM | POA: Diagnosis not present

## 2016-11-04 DIAGNOSIS — L03317 Cellulitis of buttock: Secondary | ICD-10-CM | POA: Diagnosis not present

## 2016-11-10 DIAGNOSIS — M25512 Pain in left shoulder: Secondary | ICD-10-CM | POA: Diagnosis not present

## 2016-11-10 DIAGNOSIS — M7552 Bursitis of left shoulder: Secondary | ICD-10-CM | POA: Diagnosis not present

## 2016-11-12 DIAGNOSIS — L03317 Cellulitis of buttock: Secondary | ICD-10-CM | POA: Diagnosis not present

## 2016-11-12 DIAGNOSIS — M069 Rheumatoid arthritis, unspecified: Secondary | ICD-10-CM | POA: Diagnosis not present

## 2016-11-12 DIAGNOSIS — R197 Diarrhea, unspecified: Secondary | ICD-10-CM | POA: Diagnosis not present

## 2016-12-15 DIAGNOSIS — E119 Type 2 diabetes mellitus without complications: Secondary | ICD-10-CM | POA: Diagnosis not present

## 2016-12-15 DIAGNOSIS — H2513 Age-related nuclear cataract, bilateral: Secondary | ICD-10-CM | POA: Diagnosis not present

## 2016-12-16 DIAGNOSIS — Z79899 Other long term (current) drug therapy: Secondary | ICD-10-CM | POA: Diagnosis not present

## 2016-12-16 DIAGNOSIS — M059 Rheumatoid arthritis with rheumatoid factor, unspecified: Secondary | ICD-10-CM | POA: Diagnosis not present

## 2016-12-16 DIAGNOSIS — F17210 Nicotine dependence, cigarettes, uncomplicated: Secondary | ICD-10-CM | POA: Diagnosis not present

## 2016-12-16 DIAGNOSIS — Z1589 Genetic susceptibility to other disease: Secondary | ICD-10-CM | POA: Diagnosis not present

## 2016-12-20 DIAGNOSIS — Z79899 Other long term (current) drug therapy: Secondary | ICD-10-CM | POA: Diagnosis not present

## 2016-12-20 DIAGNOSIS — M059 Rheumatoid arthritis with rheumatoid factor, unspecified: Secondary | ICD-10-CM | POA: Diagnosis not present

## 2016-12-24 DIAGNOSIS — F419 Anxiety disorder, unspecified: Secondary | ICD-10-CM | POA: Diagnosis not present

## 2016-12-24 DIAGNOSIS — F329 Major depressive disorder, single episode, unspecified: Secondary | ICD-10-CM | POA: Diagnosis not present

## 2016-12-24 DIAGNOSIS — G4700 Insomnia, unspecified: Secondary | ICD-10-CM | POA: Diagnosis not present

## 2017-01-06 DIAGNOSIS — M79642 Pain in left hand: Secondary | ICD-10-CM | POA: Diagnosis not present

## 2017-01-06 DIAGNOSIS — M19012 Primary osteoarthritis, left shoulder: Secondary | ICD-10-CM | POA: Diagnosis not present

## 2017-01-06 DIAGNOSIS — M1812 Unilateral primary osteoarthritis of first carpometacarpal joint, left hand: Secondary | ICD-10-CM | POA: Diagnosis not present

## 2017-01-17 DIAGNOSIS — M059 Rheumatoid arthritis with rheumatoid factor, unspecified: Secondary | ICD-10-CM | POA: Diagnosis not present

## 2017-01-17 DIAGNOSIS — Z79899 Other long term (current) drug therapy: Secondary | ICD-10-CM | POA: Diagnosis not present

## 2017-01-27 DIAGNOSIS — M25512 Pain in left shoulder: Secondary | ICD-10-CM | POA: Diagnosis not present

## 2017-01-27 DIAGNOSIS — N6320 Unspecified lump in the left breast, unspecified quadrant: Secondary | ICD-10-CM | POA: Diagnosis not present

## 2017-01-27 DIAGNOSIS — G8929 Other chronic pain: Secondary | ICD-10-CM | POA: Diagnosis not present

## 2017-02-03 DIAGNOSIS — M25562 Pain in left knee: Secondary | ICD-10-CM | POA: Diagnosis not present

## 2017-02-03 DIAGNOSIS — M25511 Pain in right shoulder: Secondary | ICD-10-CM | POA: Diagnosis not present

## 2017-02-03 DIAGNOSIS — M7552 Bursitis of left shoulder: Secondary | ICD-10-CM | POA: Diagnosis not present

## 2017-02-16 DIAGNOSIS — N6459 Other signs and symptoms in breast: Secondary | ICD-10-CM | POA: Diagnosis not present

## 2017-02-24 DIAGNOSIS — M25512 Pain in left shoulder: Secondary | ICD-10-CM | POA: Diagnosis not present

## 2017-02-24 DIAGNOSIS — G8929 Other chronic pain: Secondary | ICD-10-CM | POA: Diagnosis not present

## 2017-03-02 ENCOUNTER — Ambulatory Visit: Payer: Medicare Other | Admitting: Physician Assistant

## 2017-03-09 ENCOUNTER — Encounter: Payer: Self-pay | Admitting: Family Medicine

## 2017-03-09 ENCOUNTER — Encounter: Payer: Self-pay | Admitting: Physician Assistant

## 2017-03-09 ENCOUNTER — Ambulatory Visit (INDEPENDENT_AMBULATORY_CARE_PROVIDER_SITE_OTHER): Payer: Medicare Other | Admitting: Family Medicine

## 2017-03-09 ENCOUNTER — Ambulatory Visit (INDEPENDENT_AMBULATORY_CARE_PROVIDER_SITE_OTHER): Payer: Medicare Other | Admitting: Physician Assistant

## 2017-03-09 VITALS — BP 120/70 | HR 69 | Ht 63.0 in | Wt 127.0 lb

## 2017-03-09 VITALS — BP 120/70 | HR 69 | Wt 127.0 lb

## 2017-03-09 DIAGNOSIS — E119 Type 2 diabetes mellitus without complications: Secondary | ICD-10-CM | POA: Diagnosis not present

## 2017-03-09 DIAGNOSIS — Z720 Tobacco use: Secondary | ICD-10-CM | POA: Diagnosis not present

## 2017-03-09 DIAGNOSIS — I213 ST elevation (STEMI) myocardial infarction of unspecified site: Secondary | ICD-10-CM | POA: Diagnosis not present

## 2017-03-09 DIAGNOSIS — F5101 Primary insomnia: Secondary | ICD-10-CM | POA: Diagnosis not present

## 2017-03-09 DIAGNOSIS — M069 Rheumatoid arthritis, unspecified: Secondary | ICD-10-CM

## 2017-03-09 DIAGNOSIS — F419 Anxiety disorder, unspecified: Secondary | ICD-10-CM

## 2017-03-09 DIAGNOSIS — M0579 Rheumatoid arthritis with rheumatoid factor of multiple sites without organ or systems involvement: Secondary | ICD-10-CM

## 2017-03-09 DIAGNOSIS — Z79899 Other long term (current) drug therapy: Secondary | ICD-10-CM | POA: Diagnosis not present

## 2017-03-09 DIAGNOSIS — R748 Abnormal levels of other serum enzymes: Secondary | ICD-10-CM

## 2017-03-09 DIAGNOSIS — I2510 Atherosclerotic heart disease of native coronary artery without angina pectoris: Secondary | ICD-10-CM

## 2017-03-09 DIAGNOSIS — G894 Chronic pain syndrome: Secondary | ICD-10-CM | POA: Diagnosis not present

## 2017-03-09 DIAGNOSIS — I219 Acute myocardial infarction, unspecified: Secondary | ICD-10-CM

## 2017-03-09 DIAGNOSIS — E039 Hypothyroidism, unspecified: Secondary | ICD-10-CM | POA: Diagnosis not present

## 2017-03-09 DIAGNOSIS — R945 Abnormal results of liver function studies: Secondary | ICD-10-CM | POA: Diagnosis not present

## 2017-03-09 DIAGNOSIS — M7582 Other shoulder lesions, left shoulder: Secondary | ICD-10-CM | POA: Diagnosis not present

## 2017-03-09 DIAGNOSIS — J449 Chronic obstructive pulmonary disease, unspecified: Secondary | ICD-10-CM | POA: Diagnosis not present

## 2017-03-09 DIAGNOSIS — J4489 Other specified chronic obstructive pulmonary disease: Secondary | ICD-10-CM

## 2017-03-09 DIAGNOSIS — F330 Major depressive disorder, recurrent, mild: Secondary | ICD-10-CM | POA: Diagnosis not present

## 2017-03-09 DIAGNOSIS — M810 Age-related osteoporosis without current pathological fracture: Secondary | ICD-10-CM

## 2017-03-09 DIAGNOSIS — Z1231 Encounter for screening mammogram for malignant neoplasm of breast: Secondary | ICD-10-CM

## 2017-03-09 LAB — COMPLETE METABOLIC PANEL WITH GFR
AG RATIO: 1.6 (calc) (ref 1.0–2.5)
ALBUMIN MSPROF: 4.3 g/dL (ref 3.6–5.1)
ALT: 100 U/L — ABNORMAL HIGH (ref 6–29)
AST: 65 U/L — ABNORMAL HIGH (ref 10–35)
Alkaline phosphatase (APISO): 197 U/L — ABNORMAL HIGH (ref 33–130)
BUN: 10 mg/dL (ref 7–25)
CALCIUM: 9.8 mg/dL (ref 8.6–10.4)
CO2: 28 mmol/L (ref 20–32)
CREATININE: 0.72 mg/dL (ref 0.50–0.99)
Chloride: 102 mmol/L (ref 98–110)
GFR, EST AFRICAN AMERICAN: 100 mL/min/{1.73_m2} (ref >=60)
GFR, EST NON AFRICAN AMERICAN: 87 mL/min/{1.73_m2} (ref >=60)
GLOBULIN: 2.7 g/dL (ref 1.9–3.7)
Glucose, Bld: 112 mg/dL — ABNORMAL HIGH (ref 65–99)
POTASSIUM: 4.3 mmol/L (ref 3.5–5.3)
SODIUM: 139 mmol/L (ref 135–146)
Total Bilirubin: 0.8 mg/dL (ref 0.2–1.2)
Total Protein: 7 g/dL (ref 6.1–8.1)

## 2017-03-09 LAB — LIPID PANEL W/REFLEX DIRECT LDL
CHOL/HDL RATIO: 2.1 (calc) (ref <5.0)
CHOLESTEROL: 178 mg/dL (ref <200)
HDL: 83 mg/dL (ref >50)
LDL Cholesterol (Calc): 74 mg/dL (calc)
Non-HDL Cholesterol (Calc): 95 mg/dL (calc) (ref <130)
Triglycerides: 119 mg/dL (ref <150)

## 2017-03-09 MED ORDER — ALPRAZOLAM 1 MG PO TABS: ORAL_TABLET | ORAL | 5 refills | Status: DC

## 2017-03-09 MED ORDER — SERTRALINE HCL 50 MG PO TABS: 50.0000 mg | ORAL_TABLET | Freq: Every day | ORAL | 1 refills | Status: DC

## 2017-03-09 MED ORDER — LEVOTHYROXINE SODIUM 150 MCG PO TABS: 150.0000 ug | ORAL_TABLET | Freq: Every day | ORAL | 1 refills | Status: DC

## 2017-03-09 NOTE — Patient Instructions (Signed)
Thank you for coming in today. Call or go to the ER if you develop a large red swollen joint with extreme pain or oozing puss.  Recheck as needed.  You should hear form Rheumatology soon.  If you do not hear anything let me know.  Give them about 1 week.   I can inject those little joints in the hands as needed.    Shoulder Impingement Syndrome Shoulder impingement syndrome is a condition that causes pain when connective tissues (tendons) surrounding the shoulder joint become pinched. These tendons are part of the group of muscles and tissues that help to stabilize the shoulder (rotator cuff). Beneath the rotator cuff is a fluid-filled sac (bursa) that allows the muscles and tendons to glide smoothly. The bursa may become swollen or irritated (bursitis). Bursitis, swelling in the rotator cuff tendons, or both conditions can decrease how much space is under a bone in the shoulder joint (acromion), resulting in impingement. What are the causes? Shoulder impingement syndrome can be caused by bursitis or swelling of the rotator cuff tendons, which may result from:  Repetitive overhead arm movements.  Falling onto the shoulder.  Weakness in the shoulder muscles.  What increases the risk? You may be more likely to develop this condition if you are an athlete who participates in:  Sports that involve throwing, such as baseball.  Tennis.  Swimming.  Volleyball.  Some people are also more likely to develop impingement syndrome because of the shape of their acromion bone. What are the signs or symptoms? The main symptom of this condition is pain on the front or side of the shoulder. Pain may:  Get worse when lifting or raising the arm.  Get worse at night.  Wake you up from sleeping.  Feel sharp when the shoulder is moved, and then fade to an ache.  Other signs and symptoms may include:  Tenderness.  Stiffness.  Inability to raise the arm above shoulder level or behind the  body.  Weakness.  How is this diagnosed? This condition may be diagnosed based on:  Your symptoms.  Your medical history.  A physical exam.  Imaging tests, such as: ? X-rays. ? MRI. ? Ultrasound.  How is this treated? Treatment for this condition may include:  Resting your shoulder and avoiding all activities that cause pain or put stress on the shoulder.  Icing your shoulder.  NSAIDs to help reduce pain and swelling.  One or more injections of medicines to numb the area and reduce inflammation.  Physical therapy.  Surgery. This may be needed if nonsurgical treatments have not helped. Surgery may involve repairing the rotator cuff, reshaping the acromion, or removing the bursa.  Follow these instructions at home: Managing pain, stiffness, and swelling  If directed, apply ice to the injured area. ? Put ice in a plastic bag. ? Place a towel between your skin and the bag. ? Leave the ice on for 20 minutes, 2-3 times a day. Activity  Rest and return to your normal activities as told by your health care provider. Ask your health care provider what activities are safe for you.  Do exercises as told by your health care provider. General instructions  Do not use any tobacco products, including cigarettes, chewing tobacco, or e-cigarettes. Tobacco can delay healing. If you need help quitting, ask your health care provider.  Ask your health care provider when it is safe for you to drive.  Take over-the-counter and prescription medicines only as told by your health  care provider.  Keep all follow-up visits as told by your health care provider. This is important. How is this prevented?  Give your body time to rest between periods of activity.  Be safe and responsible while being active to avoid falls.  Maintain physical fitness, including strength and flexibility. Contact a health care provider if:  Your symptoms have not improved after 1-2 months of treatment and  rest.  You cannot lift your arm away from your body. This information is not intended to replace advice given to you by your health care provider. Make sure you discuss any questions you have with your health care provider. Document Released: 05/17/2005 Document Revised: 01/22/2016 Document Reviewed: 04/19/2015 Elsevier Interactive Patient Education  [employer].

## 2017-03-09 NOTE — Progress Notes (Signed)
Subjective:    Patient ID: [name], female    DOB: 07/22/1950, 66 y.o.   MRN: [id number]  HPI Pt is a 66 yo pleasant female who presents to the clinic today to reestablish care after a move back here from [place]. [place].   [name]. Active Ambulatory Problems    Diagnosis Date Noted  . DYSLIPIDEMIA 09/28/2007  . HYPERCALCEMIA 11/13/2009  . MDD (major depressive disorder), recurrent episode, mild (HCC) 09/28/2007  . ALCOHOLIC FATTY LIVER 12/24/2009  . FATTY LIVER DISEASE 09/28/2007  . ARTHRITIS, ACROMIOCLAVICULAR 07/31/2008  . SPINAL STENOSIS IN CERVICAL REGION 07/31/2008  . ROTATOR CUFF SYNDROME, RIGHT 04/01/2008  . OSTEOPOROSIS 10/17/2007  . LIVER FUNCTION TESTS, ABNORMAL 09/28/2007  . ALLERGIC REACTION 12/24/2009  . Allergic rhinitis 10/06/2010  . Chronic headache 04/03/2011  . Tobacco abuse 04/03/2011  . Chronic pain 04/03/2011  . Hypertension 04/03/2011  . Sacroiliitis (HCC) 09/14/2011  . Arthritis of carpometacarpal joint 02/14/2012  . Diabetes type 2, controlled (HCC) 03/14/2013  . Hypothyroidism 03/14/2013  . Trochanteric bursitis of right hip 05/04/2013  . Anxiety state 05/16/2013  . Ganglion cyst of wrist 07/13/2013  . Right shoulder pain 09/23/2013  . Chronic pain syndrome 12/05/2013  . COLD (chronic obstructive lung disease) (HCC) 12/06/2013  . Primary osteoarthritis of left wrist 12/10/2014  . Hyperlipidemia 01/22/2015  . 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 Myocardial infarct (HCC) 03/09/2017  . CAD (coronary artery disease) 03/09/2017  . Rheumatoid arthritis involving both hands (HCC) 03/09/2017  . Rotator cuff tendonitis, left 03/10/2017  . Anxiety 03/12/2017   Resolved Ambulatory Problems    Diagnosis Date Noted  . DM 06/05/2007  . Chronic pain syndrome 06/05/2007  . HEMORRHOIDS, WITH BLEEDING 04/22/2008  . Acute maxillary sinusitis 04/10/2010  . RENAL INSUFFICIENCY, ACUTE 10/23/2008  . ELEVATED BLOOD  PRESSURE WITHOUT DIAGNOSIS OF HYPERTENSION 05/13/2010  . CHRONIC OBSTRUCTIVE PULMONARY DISEASE, ACUTE EXACERBATION 08/14/2010  . Recurrent acute sinusitis 09/09/2010  . Allergic dermatitis due to poison ivy 11/27/2010  . DERMATITIS, ATOPIC 01/15/2011  . Ganglion cyst of left foot 07/13/2013   Past Medical History:  Diagnosis Date  . Cervicalgia   . Chronic pain   . COPD (chronic obstructive pulmonary disease) (HCC)   . DDD (degenerative disc disease), lumbar   . Diabetes mellitus   . Fatty liver   . Hyperlipidemia   . Narcotic abuse (HCC)   . Osteoarthritis   . Osteoporosis   . Thyroid disease    .[name] Family History  Problem Relation Age of Onset  . Heart disease Mother 59       MI  . Hyperlipidemia Mother   . Rheum arthritis Mother   . Lymphoma Mother   . Heart disease Father 38       MI  . Diabetes Father   . Rheum arthritis Father   . Cancer Maternal Grandmother        hodgkins lympoma  . Cirrhosis Sister        primary biliary   .[name] Social History   Social History  . Marital status: Married    Spouse name: N/A  . Number of children: N/A  . Years of education: N/A   Occupational History  . Not on file.   Social History Main Topics  . Smoking status: Current Every Day Smoker    Packs/day: 1.00    Years: 43.00    Types: Cigarettes  . Smokeless tobacco: Never Used     Comment: Has  tried Chantix and Wellbutrin in the past  . Alcohol use 3.0 oz/week    5 Standard drinks or equivalent per week  . Drug use: No  . Sexual activity: Not on file   Other Topics Concern  . Not on file   Social History Narrative  . No narrative on file    Pt has multiple chronic medical conditions. She is complaint with medications.   She did in the past year have a  STEMI and stent placed. She is on BB, Statin, plavix. She denies any CP or use of nitroglycerin recently.   Hyperthyroidism- checking TSH.   COPD- taking inhaler daily. Current smoker with no intentions to  stop. Last CXR was march and negative for any acute findings.   She is not currently on any pain medications although she is in chronic pain.   She would like refill on xanax she mostly uses in evenings where she feels anxiety is the worse.       Review of Systems  All other systems reviewed and are negative.      Objective:   Physical Exam  Constitutional: She is oriented to person, place, and time. She appears well-developed and well-nourished.  HENT:  Head: Normocephalic and atraumatic.  Neck: Normal range of motion. Neck supple.  Cardiovascular: Normal rate, regular rhythm and normal heart sounds.   Lymphadenopathy:    She has no cervical adenopathy.  Neurological: She is alert and oriented to person, place, and time.  Psychiatric: She has a normal mood and affect. Her behavior is normal.          Assessment & Plan:  Marland KitchenMarland KitchenDiagnoses and all orders for this visit:  Controlled type 2 diabetes mellitus without complication, without long-term current use of insulin (HCC) -     Hemoglobin A1c  Hypothyroidism, unspecified type -     TSH  Medication management -     CBC with Differential/Platelet -     Hemoglobin A1c -     TSH -     COMPLETE METABOLIC PANEL WITH GFR  Chronic pain syndrome  ST elevation myocardial infarction (STEMI), unspecified artery (HCC) -     Ambulatory referral to Cardiology  Coronary artery disease involving native heart without angina pectoris, unspecified vessel or lesion type -     Lipid Panel w/reflex Direct LDL -     Ambulatory referral to Cardiology  COPD (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)  Tobacco abuse  MDD (major depressive disorder), recurrent episode, mild (HCC) -     sertraline (ZOLOFT) 50 MG tablet; Take 1 tablet (50 mg total) by mouth daily.  Anxiety -     ALPRAZolam (XANAX) 1 MG tablet; TAKE 1TABLET BY MOUTH at bedtime as needed.  Primary insomnia  Rheumatoid arthritis involving  multiple sites with positive rheumatoid factor (HCC)  Other orders -     Discontinue: levothyroxine (SYNTHROID, LEVOTHROID) 150 MCG tablet; Take 1 tablet (150 mcg total) by mouth daily.   .. Depression screen PHQ 2/9 03/09/2017  Decreased Interest 0  Down, Depressed, Hopeless 1  PHQ - 2 Score 1  Some recent data might be hidden   Refilled medications today.  Will make cardiology referral. Continue on above medications.   Will order dexa and mammogram.   Reviewed records:  Did not find colonoscopy or proof of pneumonia vaccines. Will inqurie at next visit. Also due to time we did not get to foot exam or mircoalbumin.   Xanax given. Discussed about abuse  potential. Follow up in 6 months.

## 2017-03-10 DIAGNOSIS — M7582 Other shoulder lesions, left shoulder: Secondary | ICD-10-CM | POA: Insufficient documentation

## 2017-03-10 LAB — CBC WITH DIFFERENTIAL/PLATELET
BASOS ABS: 47 {cells}/uL (ref 0–200)
BASOS PCT: 0.4 %
EOS ABS: 257 {cells}/uL (ref 15–500)
EOS PCT: 2.2 %
HCT: 38.6 % (ref 35.0–45.0)
HEMOGLOBIN: 12.7 g/dL (ref 11.7–15.5)
Lymphs Abs: 2504 cells/uL (ref 850–3900)
MCH: 30.6 pg (ref 27.0–33.0)
MCHC: 32.9 g/dL (ref 32.0–36.0)
MCV: 93 fL (ref 80.0–100.0)
MONOS PCT: 6.3 %
MPV: 11 fL (ref 7.5–12.5)
Neutro Abs: 8155 cells/uL — ABNORMAL HIGH (ref 1500–7800)
Neutrophils Relative %: 69.7 %
PLATELETS: 279 10*3/uL (ref 140–400)
RBC: 4.15 10*6/uL (ref 3.80–5.10)
RDW: 12.8 % (ref 11.0–15.0)
TOTAL LYMPHOCYTE: 21.4 %
WBC mixed population: 737 cells/uL (ref 200–950)
WBC: 11.7 10*3/uL — ABNORMAL HIGH (ref 3.8–10.8)

## 2017-03-10 LAB — TSH: TSH: 0.15 mIU/L — ABNORMAL LOW (ref 0.40–4.50)

## 2017-03-10 LAB — HEMOGLOBIN A1C
EAG (MMOL/L): 7.4 (calc)
Hgb A1c MFr Bld: 6.3 % of total Hgb — ABNORMAL HIGH (ref <5.7)
MEAN PLASMA GLUCOSE: 134 (calc)

## 2017-03-10 NOTE — Progress Notes (Signed)
[name] is a 65 y.o. female who presents to [facility] today for left shoulder pain. Lashaun notes left shoulder pain ongoing now for a few weeks. The pain is located in the lateral upper arm and is worse with overhead motion itching back. She's had a history of rotator cuff tendinitis or bursitis in the past. She's been doing some home exercise program which has helped a bit. In the past she's done well with an injection and would like an injection today. She notes that she recently moved back to the area and would like a referral to a rheumatologist as well for her rheumatoid arthritis involving her hands. This is typically managed with methotrexate.   Past Medical History:  Diagnosis Date  . Cervicalgia   . Chronic pain   . COPD (chronic obstructive pulmonary disease) (HCC)   . DDD (degenerative disc disease), lumbar   . Diabetes mellitus   . Fatty liver   . Hyperlipidemia   . Narcotic abuse (HCC)    history  . Osteoarthritis   . Osteoporosis   . Thyroid disease    hypothyroid   Past Surgical History:  Procedure Laterality Date  . BREAST SURGERY     braest lump/ benign  . CHOLECYSTECTOMY    . LUMBAR LAMINECTOMY  05/2010   Dr. Netta Corrigan  . TOTAL KNEE ARTHROPLASTY  2010   right   Social History  Substance Use Topics  . Smoking status: Current Every Day Smoker    Packs/day: 1.00    Years: 43.00    Types: Cigarettes  . Smokeless tobacco: Never Used     Comment: Has tried Chantix and Wellbutrin in the past  . Alcohol use 3.0 oz/week    5 Standard drinks or equivalent per week     ROS:  As above   Medications: Current Outpatient Prescriptions  Medication Sig Dispense Refill  . alendronate (FOSAMAX) 70 MG tablet TAKE 1 TABLET BY MOUTH EVERY 7 DAYS WITH A FULL GLASS OF WATER ON AN EMPTY STOMACH 12 tablet 0  . ALPRAZolam (XANAX) 1 MG tablet TAKE 1TABLET BY MOUTH at bedtime as needed. 30 tablet 5  . aspirin EC 81 MG  tablet Take 81 mg by mouth daily.    [name] atorvastatin (LIPITOR) 20 MG tablet Take 20 mg by mouth daily.    . Cholecalciferol (VITAMIN D3) 1000 units CAPS Take by mouth.    . clopidogrel (PLAVIX) 75 MG tablet Take 75 mg by mouth daily.    [name] gabapentin (NEURONTIN) 800 MG tablet Take 800 mg by mouth daily.    [name] levothyroxine (SYNTHROID, LEVOTHROID) 150 MCG tablet Take 1 tablet (150 mcg total) by mouth daily. 90 tablet 1  . metFORMIN (GLUCOPHAGE) 500 MG tablet TAKE 1 TABLET(500 MG) BY MOUTH TWICE DAILY WITH A MEAL. NEED FOLLOW UP APPOINTMENT FOR MORE REFILLS 180 tablet 0  . methotrexate (RHEUMATREX) 15 MG tablet Take 15 mg by mouth once a week. Caution: Chemotherapy. Protect from light.    . metoprolol tartrate (LOPRESSOR) 25 MG tablet Take 12.5 mg by mouth 2 (two) times daily.    . nitroGLYCERIN (NITROSTAT) 0.4 MG SL tablet Place 0.4 mg under the tongue every 5 (five) minutes as needed for chest pain.    [name] sertraline (ZOLOFT) 50 MG tablet Take 1 tablet (50 mg total) by mouth daily. 90 tablet 1  . traZODone (DESYREL) 50 MG tablet Take 100 mg by mouth at bedtime.     No current  facility-administered medications for this visit.    Allergies  Allergen Reactions  . Budesonide-Formoterol Fumarate     REACTION: rash  . Codeine   . Cymbalta [Duloxetine Hcl]     No difference for pain or anti-depressant.   . Effexor Xr [Venlafaxine Hcl Er]     Felt more anxious or no difference.   . Fentanyl     REACTION: severe aggitation, mood changes  . Fluticasone-Salmeterol   . Neurontin [Gabapentin]     Feels like increasing pain and not effective.   . Tramadol-Acetaminophen   . Naproxen Rash    GI upset     Exam:  BP 120/70   Pulse 69   Wt 127 lb (57.6 kg)   BMI 22.50 kg/m  General: Well Developed, well nourished, and in no acute distress.  Neuro/Psych: Alert and oriented x3, extra-ocular muscles intact, able to move all 4 extremities, sensation grossly intact. Skin: Warm and dry, no rashes  noted.  Respiratory: Not using accessory muscles, speaking in full sentences, trachea midline.  Cardiovascular: Pulses palpable, no extremity edema. Abdomen: Does not appear distended. MSK: Left shoulder normal-appearing nontender. Normal motion however pain with abduction and internal rotation. Positive Hawkins and Neer's test. Positive empty can test. Strength is intact.  Hands bilaterally shows thickened enlarged PIPs and some MCP enlargement  .  Procedure: Real-time Ultrasound Guided Injection of left shoulder  Device: GE Logiq E  Images permanently stored and available for review in the ultrasound unit. Verbal informed consent obtained. Discussed risks and benefits of procedure. Warned about infection bleeding damage to structures skin hypopigmentation and fat atrophy among others. Patient expresses understanding and agreement Time-out conducted.  Noted no overlying erythema, induration, or other signs of local infection.  Skin prepped in a sterile fashion.  Local anesthesia: Topical Ethyl chloride.  With sterile technique and under real time ultrasound guidance: 40mg  Kenalog and 2 mL of Marcaine injected easily.  Completed without difficulty  Pain immediately resolved suggesting accurate placement of the medication.  Advised to call if fevers/chills, erythema, induration, drainage, or persistent bleeding.  Images permanently stored and available for review in the ultrasound unit.  Impression: Technically successful ultrasound guided injection.     No results found.    Assessment and Plan: 66 y.o. female with left shoulder pain due to rotator cuff tendinitis/bursitis. Steroid injection today followed by home exercise program. Plan to recheck in a month as needed.  Patient is new to the area and will refer to [facility] dermatology for evaluation and treatment of her rheumatoid arthritis involving hand pain.    Orders Placed This Encounter  Procedures  .  Ambulatory referral to Rheumatology    Referral Priority:   Routine    Referral Type:   Consultation    Referral Reason:   Specialty Services Required    Requested Specialty:   Rheumatology    Number of Visits Requested:   1   No orders of the defined types were placed in this encounter.   Discussed warning signs or symptoms. Please see discharge instructions. Patient expresses understanding.

## 2017-03-11 MED ORDER — LEVOTHYROXINE SODIUM 137 MCG PO TABS: 137.0000 ug | ORAL_TABLET | Freq: Every day | ORAL | 3 refills | Status: DC

## 2017-03-11 NOTE — Assessment & Plan Note (Signed)
Improving AST and ALT, elevated alkaline phosphatase,  She does need to keep follow-up with PCP sometime in a week when she returns to further discuss her ALT and AST.

## 2017-03-11 NOTE — Addendum Note (Signed)
Addended by: Monica Becton on: 03/11/2017 12:11 PM   Modules accepted: Orders

## 2017-03-11 NOTE — Assessment & Plan Note (Signed)
TSH is low suggesting that we need to decrease levothyroxine dose to 137 g with a 6 week recheck on TSH.

## 2017-03-12 ENCOUNTER — Encounter: Payer: Self-pay | Admitting: Physician Assistant

## 2017-03-12 DIAGNOSIS — F419 Anxiety disorder, unspecified: Secondary | ICD-10-CM | POA: Insufficient documentation

## 2017-03-12 NOTE — Addendum Note (Signed)
Addended by: [name] on: 03/12/2017 03:08 PM   Modules accepted: Orders

## 2017-03-15 ENCOUNTER — Ambulatory Visit: Payer: Medicare Other | Admitting: Family Medicine

## 2017-03-16 ENCOUNTER — Telehealth: Payer: Self-pay | Admitting: Physician Assistant

## 2017-03-16 DIAGNOSIS — G894 Chronic pain syndrome: Secondary | ICD-10-CM

## 2017-03-16 MED ORDER — GABAPENTIN 400 MG PO CAPS: 400.0000 mg | ORAL_CAPSULE | Freq: Two times a day (BID) | ORAL | 1 refills | Status: DC

## 2017-03-16 NOTE — Telephone Encounter (Signed)
Pt advised.

## 2017-03-16 NOTE — Telephone Encounter (Signed)
That is a really strange dose of gabapentin. Can we please double check with the patient that she is taking regular gabapentin 800mg  daily?

## 2017-03-16 NOTE — Telephone Encounter (Signed)
Gabapentin sent to pharmacy

## 2017-03-16 NOTE — Telephone Encounter (Signed)
Spoke with Pt, she is taking 800mg  daily. She prefers an Rx for gabapentin 400mg  BID rather than 800mg  once.

## 2017-03-23 ENCOUNTER — Other Ambulatory Visit: Payer: Medicare Other

## 2017-03-24 ENCOUNTER — Other Ambulatory Visit: Payer: Medicare Other

## 2017-04-04 ENCOUNTER — Other Ambulatory Visit: Payer: Self-pay | Admitting: Physician Assistant

## 2017-04-05 DIAGNOSIS — G894 Chronic pain syndrome: Secondary | ICD-10-CM | POA: Diagnosis not present

## 2017-04-05 DIAGNOSIS — G4700 Insomnia, unspecified: Secondary | ICD-10-CM | POA: Diagnosis not present

## 2017-04-05 DIAGNOSIS — F329 Major depressive disorder, single episode, unspecified: Secondary | ICD-10-CM | POA: Diagnosis not present

## 2017-04-05 DIAGNOSIS — M47817 Spondylosis without myelopathy or radiculopathy, lumbosacral region: Secondary | ICD-10-CM | POA: Diagnosis not present

## 2017-04-13 ENCOUNTER — Telehealth: Payer: Self-pay | Admitting: *Deleted

## 2017-04-13 DIAGNOSIS — F330 Major depressive disorder, recurrent, mild: Secondary | ICD-10-CM

## 2017-04-13 DIAGNOSIS — M069 Rheumatoid arthritis, unspecified: Secondary | ICD-10-CM

## 2017-04-13 DIAGNOSIS — G894 Chronic pain syndrome: Secondary | ICD-10-CM

## 2017-04-13 DIAGNOSIS — M4802 Spinal stenosis, cervical region: Secondary | ICD-10-CM

## 2017-04-13 DIAGNOSIS — M19032 Primary osteoarthritis, left wrist: Secondary | ICD-10-CM

## 2017-04-13 NOTE — Telephone Encounter (Signed)
Pt left vm asking if you would be willing to prescribe her some Tramadol. Please advise.

## 2017-04-13 NOTE — Telephone Encounter (Signed)
We can discuss it after get liver US and recheck liver enzymes, hepatitis panel due to elevated. We need to make sure this is stable. I already ordered US.

## 2017-04-14 ENCOUNTER — Other Ambulatory Visit: Payer: Self-pay | Admitting: Physician Assistant

## 2017-04-14 ENCOUNTER — Ambulatory Visit (INDEPENDENT_AMBULATORY_CARE_PROVIDER_SITE_OTHER): Payer: Medicare Other | Admitting: Sports Medicine

## 2017-04-14 ENCOUNTER — Encounter: Payer: Self-pay | Admitting: Sports Medicine

## 2017-04-14 ENCOUNTER — Ambulatory Visit (INDEPENDENT_AMBULATORY_CARE_PROVIDER_SITE_OTHER): Payer: Medicare Other

## 2017-04-14 DIAGNOSIS — R2232 Localized swelling, mass and lump, left upper limb: Secondary | ICD-10-CM

## 2017-04-14 DIAGNOSIS — I10 Essential (primary) hypertension: Secondary | ICD-10-CM

## 2017-04-14 DIAGNOSIS — S6992XA Unspecified injury of left wrist, hand and finger(s), initial encounter: Secondary | ICD-10-CM | POA: Diagnosis not present

## 2017-04-14 DIAGNOSIS — M79622 Pain in left upper arm: Secondary | ICD-10-CM | POA: Diagnosis not present

## 2017-04-14 MED ORDER — PREDNISONE 50 MG PO TABS: ORAL_TABLET | ORAL | 0 refills | Status: DC

## 2017-04-14 NOTE — Assessment & Plan Note (Addendum)
Tender swelling of the left mid to distal biceps muscle belly, question palpable cord over the cephalic vein. Adding 5 days of prednisone, x-rays of the left humerus, shoulder, left upper extremity DVT ultrasound. Considering the masses been there for several months, I am also going to get an MRI with contrast of her left upper arm. Return to go over MRI results. Recent normal renal function last month, she will not need to hold her metformin prior to gadolinium injection. She is requesting referral to a pain management clinic, I am going to defer this to her PCP.

## 2017-04-14 NOTE — Progress Notes (Signed)
   Subjective:    I'm seeing this patient as a consultation for: Melissa Gaw, PA-C  CC: Left arm pain  HPI: For the past several months this pleasant 66 year old female has had a tender mass in her left distal biceps, slowly growing, pain is severe, persistent.  Does have a history of rheumatoid arthritis.  She has had a burst of prednisone in the past, topical measures without much improvement.  She did have a subacromial injection about a month ago on the left side, not much improvement.  Past medical history, Surgical history, Family history not pertinant except as noted below, Social history, Allergies, and medications have been entered into the medical record, reviewed, and no changes needed.   Review of Systems: No headache, visual changes, nausea, vomiting, diarrhea, constipation, dizziness, abdominal pain, skin rash, fevers, chills, night sweats, weight loss, swollen lymph nodes, body aches, joint swelling, muscle aches, chest pain, shortness of breath, mood changes, visual or auditory hallucinations.   Objective:   General: Well Developed, well nourished, and in no acute distress.  Neuro:  Extra-ocular muscles intact, able to move all 4 extremities, sensation grossly intact.  Deep tendon reflexes tested were normal. Psych: Alert and oriented, mood congruent with affect. ENT:  Ears and nose appear unremarkable.  Hearing grossly normal. Neck: Unremarkable overall appearance, trachea midline.  No visible thyroid enlargement. Eyes: Conjunctivae and lids appear unremarkable.  Pupils equal and round. Skin: Warm and dry, no rashes noted.  Cardiovascular: Pulses palpable, no extremity edema. Left humerus: Visibly swollen over the anterior distal biceps muscle belly, erythematous, exquisitely tender to palpation, good motion, question palpable cord in the left cephalic vein overlying the biceps brachii.  There is reproduction of pain with resisted supination of the forearm.  Shoulder and  elbow exams are for the most part unremarkable, some of the swelling and erythema does extend down to the medial elbow.  Impression and Recommendations:   This case required medical decision making of moderate complexity.  Mass of arm, left Tender swelling of the left mid to distal biceps muscle belly, question palpable cord over the cephalic vein. Adding 5 days of prednisone, x-rays of the left humerus, shoulder, left upper extremity DVT ultrasound. Considering the masses been there for several months, I am also going to get an MRI with contrast of her left upper arm. Return to go over MRI results. Recent normal renal function last month, she will not need to hold her metformin prior to gadolinium injection. She is requesting referral to a pain management clinic, I am going to defer this to her PCP.  ___________________________________________ Ihor Austin. Benjamin Stain, M.D., ABFM., CAQSM. Primary Care and Sports Medicine [facility]  Adjunct Instructor of Family Medicine  [employer]

## 2017-04-14 NOTE — Telephone Encounter (Signed)
Pt would also like to know if she can get a referral to pain management for joint pain. Please assist.

## 2017-04-14 NOTE — Progress Notes (Signed)
Labs ordered per radiology protocol  

## 2017-04-16 ENCOUNTER — Ambulatory Visit (HOSPITAL_BASED_OUTPATIENT_CLINIC_OR_DEPARTMENT_OTHER): Payer: Medicare Other

## 2017-04-19 ENCOUNTER — Ambulatory Visit (INDEPENDENT_AMBULATORY_CARE_PROVIDER_SITE_OTHER): Payer: Medicare Other | Admitting: Sports Medicine

## 2017-04-19 ENCOUNTER — Encounter: Payer: Self-pay | Admitting: Sports Medicine

## 2017-04-19 DIAGNOSIS — M461 Sacroiliitis, not elsewhere classified: Secondary | ICD-10-CM

## 2017-04-19 DIAGNOSIS — M19049 Primary osteoarthritis, unspecified hand: Secondary | ICD-10-CM | POA: Diagnosis not present

## 2017-04-19 DIAGNOSIS — I2510 Atherosclerotic heart disease of native coronary artery without angina pectoris: Secondary | ICD-10-CM | POA: Diagnosis not present

## 2017-04-19 MED ORDER — TRAMADOL HCL 50 MG PO TABS: 50.0000 mg | ORAL_TABLET | Freq: Three times a day (TID) | ORAL | 0 refills | Status: DC | PRN

## 2017-04-19 NOTE — Assessment & Plan Note (Signed)
Recurrence of right sacroiliitis, right sacroiliac joint injection under ultrasound guidance today. Return in 1 month. She does have a referral pending to pain management.

## 2017-04-19 NOTE — Assessment & Plan Note (Signed)
Repeat left first carpometacarpal joint injection, previous injection was 4.5 years ago.

## 2017-04-19 NOTE — Progress Notes (Signed)
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  Back pain: History of rheumatoid arthritis, known historical sacroiliitis back in 2013 was the last time she was injected, recurrence of pain, right-sided, moderate, persistent, localized in the right sacroiliac joint with radiation to the right buttock.  No bowel or bladder dysfunction, saddle numbness, constitutional symptoms.  Past medical history:  Negative.  See flowsheet/record as well for more information.  Surgical history: Negative.  See flowsheet/record as well for more information.  Family history: Negative.  See flowsheet/record as well for more information.  Social history: Negative.  See flowsheet/record as well for more information.  Allergies, and medications have been entered into the medical record, reviewed, and no changes needed.   Review of Systems: No fevers, chills, night sweats, weight loss, chest pain, or shortness of breath.   Objective:    General: Well Developed, well nourished, and in no acute distress.  Neuro: Alert and oriented x3, extra-ocular muscles intact, sensation grossly intact.  HEENT: Normocephalic, atraumatic, pupils equal round reactive to light, neck supple, no masses, no lymphadenopathy, thyroid nonpalpable.  Skin: Warm and dry, no rashes. Cardiac: Regular rate and rhythm, no murmurs rubs or gallops, no lower extremity edema.  Respiratory: Clear to auscultation bilaterally. Not using accessory muscles, speaking in full sentences. Left hand: Squared off appearance with tenderness at the base of the thumb consistent with trapeziometacarpal joint osteoarthritis Back Exam:  Inspection: Unremarkable  Motion: Flexion 45 deg, Extension 45 deg, Side Bending to 45 deg bilaterally,    Rotation to 45 deg bilaterally  SLR laying: Negative  XSLR laying: Negative  Palpable tenderness: Right sacroiliac joint. FABER: negative. Sensory change: Gross sensation intact to all lumbar and sacral dermatomes.  Reflexes: 2+ at both patellar tendons, 2+ at achilles tendons, Babinski's downgoing.  Strength at foot  Plantar-flexion: 5/5 Dorsi-flexion: 5/5 Eversion: 5/5 Inversion: 5/5  Leg strength  Quad: 5/5 Hamstring: 5/5 Hip flexor: 5/5 Hip abductors: 5/5  Gait unremarkable.  Procedure: Real-time Ultrasound Guided Injection of right sacroiliac joint Device: GE Logiq E  Verbal informed consent obtained.  Time-out conducted.  Noted no overlying erythema, induration, or other signs of local infection.  Skin prepped in a sterile fashion.  Local anesthesia: Topical Ethyl chloride.  With sterile technique and under real time ultrasound guidance: Using a 22-gauge spinal needle I dropped into the sacroiliac joint and injected 1 cc kenalog 40, 2 cc lidocaine 2 cc bupivacaine. Completed without difficulty  Pain immediately resolved suggesting accurate placement of the medication.  Advised to call if fevers/chills, erythema, induration, drainage, or persistent bleeding.  Images permanently stored and available for review in the ultrasound unit.  Impression: Technically successful ultrasound guided injection.  Procedure: Real-time Ultrasound Guided Injection of left first carpometacarpal joint Device: GE Logiq E  Verbal informed consent obtained.  Time-out conducted.  Noted no overlying erythema, induration, or other signs of local infection.  Skin prepped in a sterile fashion.  Local anesthesia: Topical Ethyl chloride.  With sterile technique and under real time ultrasound guidance: 1/2 cc Kenalog 40, 1/2 cc lidocaine injected easily Completed without difficulty  Pain immediately resolved suggesting accurate placement of the medication.  Advised to call if fevers/chills, erythema,  induration, drainage, or persistent bleeding.  Images permanently stored and available for review in the ultrasound unit.  Impression:  Technically successful ultrasound guided injection.  Impression and Recommendations:    Arthritis of carpometacarpal joint Repeat left first carpometacarpal joint injection, previous injection was 4.5 years ago.   Sacroiliitis Recurrence of right sacroiliitis, right sacroiliac joint injection under ultrasound guidance today. Return in 1 month. She does have a referral pending to pain management.  ___________________________________________ [name]. [name], M.D., ABFM., CAQSM. Primary Care and Sports Medicine [facility]  Adjunct Instructor of Family Medicine  [employer]

## 2017-04-23 ENCOUNTER — Other Ambulatory Visit: Payer: Self-pay | Admitting: Sports Medicine

## 2017-04-23 ENCOUNTER — Ambulatory Visit (HOSPITAL_BASED_OUTPATIENT_CLINIC_OR_DEPARTMENT_OTHER)
Admission: RE | Admit: 2017-04-23 | Discharge: 2017-04-23 | Disposition: A | Discharge status: Home / Self Care | Payer: Medicare Other | Source: Ambulatory Visit | Attending: Sports Medicine | Admitting: Sports Medicine

## 2017-04-23 DIAGNOSIS — R2232 Localized swelling, mass and lump, left upper limb: Secondary | ICD-10-CM | POA: Diagnosis not present

## 2017-04-23 DIAGNOSIS — X58XXXA Exposure to other specified factors, initial encounter: Secondary | ICD-10-CM | POA: Insufficient documentation

## 2017-04-23 DIAGNOSIS — S46812A Strain of other muscles, fascia and tendons at shoulder and upper arm level, left arm, initial encounter: Secondary | ICD-10-CM | POA: Insufficient documentation

## 2017-04-23 DIAGNOSIS — M75122 Complete rotator cuff tear or rupture of left shoulder, not specified as traumatic: Secondary | ICD-10-CM | POA: Insufficient documentation

## 2017-04-23 DIAGNOSIS — M79621 Pain in right upper arm: Secondary | ICD-10-CM | POA: Diagnosis not present

## 2017-04-23 DIAGNOSIS — S43082A Other subluxation of left shoulder joint, initial encounter: Secondary | ICD-10-CM | POA: Insufficient documentation

## 2017-04-23 DIAGNOSIS — M7989 Other specified soft tissue disorders: Secondary | ICD-10-CM | POA: Diagnosis not present

## 2017-04-27 ENCOUNTER — Ambulatory Visit (INDEPENDENT_AMBULATORY_CARE_PROVIDER_SITE_OTHER): Payer: Medicare Other

## 2017-04-27 ENCOUNTER — Encounter: Payer: Self-pay | Admitting: Physician Assistant

## 2017-04-27 ENCOUNTER — Other Ambulatory Visit: Payer: Self-pay | Admitting: Physician Assistant

## 2017-04-27 DIAGNOSIS — M85852 Other specified disorders of bone density and structure, left thigh: Secondary | ICD-10-CM | POA: Diagnosis not present

## 2017-04-27 DIAGNOSIS — K760 Fatty (change of) liver, not elsewhere classified: Secondary | ICD-10-CM | POA: Diagnosis not present

## 2017-04-27 DIAGNOSIS — K838 Other specified diseases of biliary tract: Secondary | ICD-10-CM | POA: Insufficient documentation

## 2017-04-27 DIAGNOSIS — Z780 Asymptomatic menopausal state: Secondary | ICD-10-CM | POA: Diagnosis not present

## 2017-04-27 DIAGNOSIS — R7989 Other specified abnormal findings of blood chemistry: Secondary | ICD-10-CM | POA: Diagnosis not present

## 2017-05-03 ENCOUNTER — Other Ambulatory Visit: Payer: Self-pay | Admitting: *Deleted

## 2017-05-03 DIAGNOSIS — F5101 Primary insomnia: Secondary | ICD-10-CM

## 2017-05-03 MED ORDER — TRAZODONE HCL 50 MG PO TABS: 100.0000 mg | ORAL_TABLET | Freq: Every day | ORAL | 1 refills | Status: DC

## 2017-05-11 ENCOUNTER — Ambulatory Visit: Payer: Medicare Other | Admitting: Sports Medicine

## 2017-05-13 ENCOUNTER — Ambulatory Visit (INDEPENDENT_AMBULATORY_CARE_PROVIDER_SITE_OTHER): Payer: Medicare Other | Admitting: Sports Medicine

## 2017-05-13 ENCOUNTER — Encounter: Payer: Self-pay | Admitting: Sports Medicine

## 2017-05-13 DIAGNOSIS — M19049 Primary osteoarthritis, unspecified hand: Secondary | ICD-10-CM | POA: Diagnosis not present

## 2017-05-13 DIAGNOSIS — M461 Sacroiliitis, not elsewhere classified: Secondary | ICD-10-CM | POA: Diagnosis not present

## 2017-05-13 DIAGNOSIS — M5136 Other intervertebral disc degeneration, lumbar region: Secondary | ICD-10-CM

## 2017-05-13 DIAGNOSIS — R2232 Localized swelling, mass and lump, left upper limb: Secondary | ICD-10-CM

## 2017-05-13 DIAGNOSIS — M51369 Other intervertebral disc degeneration, lumbar region without mention of lumbar back pain or lower extremity pain: Secondary | ICD-10-CM | POA: Insufficient documentation

## 2017-05-13 DIAGNOSIS — I2510 Atherosclerotic heart disease of native coronary artery without angina pectoris: Secondary | ICD-10-CM

## 2017-05-13 MED ORDER — DESVENLAFAXINE SUCCINATE ER 50 MG PO TB24: 50.0000 mg | ORAL_TABLET | Freq: Every day | ORAL | 3 refills | Status: DC

## 2017-05-13 NOTE — Assessment & Plan Note (Signed)
Resolved now after first carpometacarpal joint injection a month ago.

## 2017-05-13 NOTE — Assessment & Plan Note (Signed)
Right-sided SI joint injection provided good relief of sacroiliac pain.  She does have some pain laterally likely related to her lumbar degenerative disc disease.

## 2017-05-13 NOTE — Assessment & Plan Note (Addendum)
She has gone [place] a pain clinic at [place], I am going to give this a stab. We will switch from Zoloft to Pristiq, has not had good efficacy with venlafaxine or Cymbalta. Continue other medications. If insufficient relief after a solid month on Pristiq we can proceed with another epidural, she does have her MRI disc. I'm going to down taper her Zoloft for now.

## 2017-05-13 NOTE — Assessment & Plan Note (Signed)
This turns out just be fluid tracking from the joint down the biceps tendon sheath from a rotator cuff tear. This was noted on MRI. Ultimately she's really not painful or so we can simply watch this for now.

## 2017-05-13 NOTE — Patient Instructions (Signed)
Do one half tab Zoloft daily for a week and then stop. May start Pristiq when you get it.

## 2017-05-13 NOTE — Progress Notes (Signed)
Subjective:    CC: Arm pain, back pain  HPI: Low back pain: Known lumbar degenerative disc disease, multilevel spondylitic processes, worst at L3-L4 with degenerative disc disease and facet arthritis. She's had some epidurals in the past, has had operative intervention but doesn't tell me exactly which surgery. Her previous pain provider was in [place].  Left arm pain: There was significant swelling, ultimately we obtained an ultrasound to evaluate for upper immediately DVT which was negative, MRI showed rotator cuff tearing and fluid tracking down the biceps sheath to the biceps muscle belly likely the cause of her swelling. Pain has overall improved.  Left carpometacarpal joint arthritis: Resolved after injection last month  Right sacroiliitis: Resolved after sacral iliac joint injection, her pain is now somewhat lateral to the SI joint on the right.  Past medical history:  Negative.  See flowsheet/record as well for more information.  Surgical history: Negative.  See flowsheet/record as well for more information.  Family history: Negative.  See flowsheet/record as well for more information.  Social history: Negative.  See flowsheet/record as well for more information.  Allergies, and medications have been entered into the medical record, reviewed, and no changes needed.   (To billers/coders, pertinent past medical, social, surgical, family history can be found in problem list, if problem list is marked as reviewed then this indicates that past medical, social, surgical, family history was also reviewed)  Review of Systems: No fevers, chills, night sweats, weight loss, chest pain, or shortness of breath.   Objective:    General: Well Developed, well nourished, and in no acute distress.  Neuro: Alert and oriented x3, extra-ocular muscles intact, sensation grossly intact.  HEENT: Normocephalic, atraumatic, pupils equal round reactive to light, neck supple, no masses, no  lymphadenopathy, thyroid nonpalpable.  Skin: Warm and dry, no rashes. Cardiac: Regular rate and rhythm, no murmurs rubs or gallops, no lower extremity edema.  Respiratory: Clear to auscultation bilaterally. Not using accessory muscles, speaking in full sentences. Back Exam:  Inspection: Unremarkable  Motion: Flexion 45 deg, Extension 45 deg, Side Bending to 45 deg bilaterally,  Rotation to 45 deg bilaterally  SLR laying: Negative  XSLR laying: Negative  Palpable tenderness: She is somewhat hyperesthetic to light palpation with pain out of proportion to palpation consistent with myofascial pain syndrome.[name] FABER: negative. Sensory change: Gross sensation intact to all lumbar and sacral dermatomes.  Reflexes: 2+ at both patellar tendons, 2+ at achilles tendons, Babinski's downgoing.  Strength at foot  Plantar-flexion: 5/5 Dorsi-flexion: 5/5 Eversion: 5/5 Inversion: 5/5  Leg strength  Quad: 5/5 Hamstring: 5/5 Hip flexor: 5/5 Hip abductors: 5/5  Gait unremarkable.  Impression and Recommendations:    Left upper arm swelling secondary to rotator cuff tear and fluid tracking down biceps tendon sheath This turns out just be fluid tracking from the joint down the biceps tendon sheath from a rotator cuff tear. This was noted on MRI. Ultimately she's really not painful or so we can simply watch this for now.  Sacroiliitis Right-sided SI joint injection provided good relief of sacroiliac pain.  She does have some pain laterally likely related to her lumbar degenerative disc disease.  Arthritis of carpometacarpal joint Resolved now after first carpometacarpal joint injection a month ago.  Lumbar degenerative disc disease She has gone to a pain clinic at another state, I am going to give this a stab. We will switch from Zoloft to Pristiq, has not had good efficacy with venlafaxine or Cymbalta. Continue other medications. If  insufficient relief after a solid month on Pristiq we can proceed with  another epidural, she does have her MRI disc. I'm going to down taper her Zoloft for now.  ___________________________________________ Ihor Austin. Benjamin Stain, M.D., ABFM., CAQSM. Primary Care and Sports Medicine  [facility]  Adjunct Instructor of Family Medicine  [employer]

## 2017-05-25 DIAGNOSIS — R933 Abnormal findings on diagnostic imaging of other parts of digestive tract: Secondary | ICD-10-CM | POA: Insufficient documentation

## 2017-05-27 ENCOUNTER — Encounter: Payer: Self-pay | Admitting: *Deleted

## 2017-05-27 ENCOUNTER — Other Ambulatory Visit: Payer: Self-pay | Admitting: *Deleted

## 2017-05-30 DIAGNOSIS — R933 Abnormal findings on diagnostic imaging of other parts of digestive tract: Secondary | ICD-10-CM | POA: Diagnosis not present

## 2017-05-30 DIAGNOSIS — K838 Other specified diseases of biliary tract: Secondary | ICD-10-CM | POA: Diagnosis not present

## 2017-05-30 DIAGNOSIS — R932 Abnormal findings on diagnostic imaging of liver and biliary tract: Secondary | ICD-10-CM | POA: Diagnosis not present

## 2017-05-30 DIAGNOSIS — K8689 Other specified diseases of pancreas: Secondary | ICD-10-CM | POA: Diagnosis not present

## 2017-06-02 ENCOUNTER — Telehealth: Payer: Self-pay | Admitting: *Deleted

## 2017-06-02 DIAGNOSIS — M069 Rheumatoid arthritis, unspecified: Secondary | ICD-10-CM

## 2017-06-02 NOTE — Telephone Encounter (Signed)
New rheumatology referral placed per pt request.

## 2017-06-07 ENCOUNTER — Encounter: Payer: Self-pay | Admitting: Family Medicine

## 2017-06-07 ENCOUNTER — Ambulatory Visit (INDEPENDENT_AMBULATORY_CARE_PROVIDER_SITE_OTHER): Payer: Medicare Other | Admitting: Family Medicine

## 2017-06-07 VITALS — BP 145/72 | HR 99 | Wt 131.0 lb

## 2017-06-07 DIAGNOSIS — M25512 Pain in left shoulder: Secondary | ICD-10-CM

## 2017-06-07 DIAGNOSIS — G8929 Other chronic pain: Secondary | ICD-10-CM | POA: Diagnosis not present

## 2017-06-07 DIAGNOSIS — Z23 Encounter for immunization: Secondary | ICD-10-CM | POA: Diagnosis not present

## 2017-06-07 NOTE — Progress Notes (Signed)
[name] is a 67 y.o. female who presents to [facility] today for left shoulder pain.   [name] notes pain in the left shoulder.  This is been ongoing chronically for years.  She has a diagnosis of rotator cuff tendinitis and in October had a subacromial bursa injection that worked reasonably well.  She has had injections in the glenohumeral joint that worked better in the past and she like to try that today if possible.  She notes pain diffusely in the left shoulder with overhead motion reaching back as well as simply at rest.  She denies any significant radiating pain she was or chills.  And she feels well otherwise.   Past Medical History:  Diagnosis Date  . Cervicalgia   . Chronic pain   . COPD (chronic obstructive pulmonary disease) (HCC)   . DDD (degenerative disc disease), lumbar   . Diabetes mellitus   . Fatty liver   . Hyperlipidemia   . Narcotic abuse (HCC)    history  . Osteoarthritis   . Osteoporosis   . Thyroid disease    hypothyroid   Past Surgical History:  Procedure Laterality Date  . BREAST SURGERY     braest lump/ benign  . CHOLECYSTECTOMY    . LUMBAR LAMINECTOMY  05/2010   Dr. Netta Corrigan  . TOTAL KNEE ARTHROPLASTY  2010   right   Social History   Tobacco Use  . Smoking status: Current Every Day Smoker    Packs/day: 1.00    Years: 43.00    Pack years: 43.00    Types: Cigarettes  . Smokeless tobacco: Never Used  . Tobacco comment: Has tried Chantix and Wellbutrin in the past  Substance Use Topics  . Alcohol use: Yes    Alcohol/week: 3.0 oz    Types: 5 Standard drinks or equivalent per week     ROS:  As above   Medications: Current Outpatient Medications  Medication Sig Dispense Refill  . alendronate (FOSAMAX) 70 MG tablet TAKE ONE TABLET EVERY 7 DAYS WITH A FULL GLASS OF WATER ON AN EMPTY STOMACH 4 tablet 11  . ALPRAZolam (XANAX) 1 MG tablet TAKE 1TABLET BY MOUTH at bedtime as needed. 30 tablet  5  . aspirin EC 81 MG tablet Take 81 mg by mouth daily.    [name] atorvastatin (LIPITOR) 20 MG tablet Take 20 mg by mouth daily.    . Cholecalciferol (VITAMIN D3) 1000 units CAPS Take by mouth.    . clopidogrel (PLAVIX) 75 MG tablet Take 75 mg by mouth daily.    [name] desvenlafaxine (PRISTIQ) 50 MG 24 hr tablet Take 1 tablet (50 mg total) by mouth daily. 30 tablet 3  . gabapentin (NEURONTIN) 400 MG capsule Take 1 capsule (400 mg total) by mouth 2 (two) times daily. 180 capsule 1  . levothyroxine (SYNTHROID, LEVOTHROID) 137 MCG tablet Take 1 tablet (137 mcg total) by mouth daily before breakfast. 30 tablet 3  . metFORMIN (GLUCOPHAGE) 500 MG tablet TAKE 1 TABLET(500 MG) BY MOUTH TWICE DAILY WITH A MEAL. NEED FOLLOW UP APPOINTMENT FOR MORE REFILLS 180 tablet 0  . methotrexate (RHEUMATREX) 15 MG tablet Take 15 mg by mouth once a week. Caution: Chemotherapy. Protect from light.    . metoprolol tartrate (LOPRESSOR) 25 MG tablet Take 12.5 mg by mouth 2 (two) times daily.    . nitroGLYCERIN (NITROSTAT) 0.4 MG SL tablet Place 0.4 mg under the tongue every 5 (five) minutes as needed for chest pain.    [name]  sertraline (ZOLOFT) 50 MG tablet Take 1 tablet (50 mg total) by mouth daily. 90 tablet 1  . traMADol (ULTRAM) 50 MG tablet Take 1 tablet (50 mg total) by mouth every 8 (eight) hours as needed. 20 tablet 0  . traZODone (DESYREL) 50 MG tablet Take 2 tablets (100 mg total) by mouth at bedtime. 60 tablet 1   No current facility-administered medications for this visit.    Allergies  Allergen Reactions  . Codeine Palpitations and Other (See Comments)  . Naproxen Rash and Palpitations    GI upset  . Budesonide-Formoterol Fumarate Rash    REACTION: rash REACTION: rash REACTION: rash  . Duloxetine Hcl Other (See Comments)    No difference for pain or anti-depressant.  Other reaction(s): Other No difference for pain or anti-depressant. No difference for pain or anti-depressant.  . Fentanyl Other (See Comments)     REACTION: severe aggitation, mood changes Other reaction(s): Hallucination, Psychiatric Hallucinations Hallucinations REACTION: severe aggitation, mood changes   . Tramadol-Acetaminophen     Other reaction(s): Unknown  . Effexor Xr [Venlafaxine Hcl Er]     Felt more anxious or no difference.   . Fluticasone-Salmeterol      Exam:  BP (!) 145/72   Pulse 99   Wt 131 lb (59.4 kg)   BMI 23.21 kg/m  General: Well Developed, well nourished, and in no acute distress.  Neuro/Psych: Alert and oriented x3, extra-ocular muscles intact, able to move all 4 extremities, sensation grossly intact. Skin: Warm and dry, no rashes noted.  Respiratory: Not using accessory muscles, speaking in full sentences, trachea midline.  Cardiovascular: Pulses palpable, no extremity edema. Abdomen: Does not appear distended. MSK: Left shoulder normal-appearing with no erythema. Nontender. Decreased range of motion due to pain. Positive impingement testing. Strength is somewhat diminished but however is intact.  Procedure: Real-time Ultrasound Guided Injection of left glenohumeral joint Device: GE Logiq E  Images permanently stored and available for review in the ultrasound unit. Verbal informed consent obtained. Discussed risks and benefits of procedure. Warned about infection bleeding damage to structures skin hypopigmentation and fat atrophy among others. Patient expresses understanding and agreement Time-out conducted.  Noted no overlying erythema, induration, or other signs of local infection.  Skin prepped in a sterile fashion.  Local anesthesia: Topical Ethyl chloride.  With sterile technique and under real time ultrasound guidance:40 mg of Kenalog and 3 mL of Marcaine injected easily.  Completed without difficulty  Pain immediately resolved suggesting accurate placement of the medication.  Advised to call if fevers/chills, erythema, induration, drainage, or persistent bleeding.  Images  permanently stored and available for review in the ultrasound unit.  Impression: Technically successful ultrasound guided injection.      No results found for this or any previous visit (from the past 48 hour(s)). No results found.    Assessment and Plan: 67 y.o. female with left shoulder pain.  Status post glenohumeral injection.  Recheck as needed.  Continue home exercise program.  Flu vaccine given today prior to discharge.    Orders Placed This Encounter  Procedures  . Flu Vaccine QUAD 36+ mos IM    cunningham   No orders of the defined types were placed in this encounter.   Discussed warning signs or symptoms. Please see discharge instructions. Patient expresses understanding.

## 2017-06-07 NOTE — Patient Instructions (Signed)
Thank you for coming in today. Recheck as needed. Continue home exercises.  I suspect that Gastroenterology will do a ERCP.  Call or go to the ER if you develop a large red swollen joint with extreme pain or oozing puss.

## 2017-06-10 ENCOUNTER — Encounter: Payer: Self-pay | Admitting: Physician Assistant

## 2017-06-10 ENCOUNTER — Ambulatory Visit (INDEPENDENT_AMBULATORY_CARE_PROVIDER_SITE_OTHER): Payer: Medicare Other | Admitting: Physician Assistant

## 2017-06-10 VITALS — BP 153/76 | HR 98 | Ht 63.0 in | Wt 131.0 lb

## 2017-06-10 DIAGNOSIS — H8111 Benign paroxysmal vertigo, right ear: Secondary | ICD-10-CM

## 2017-06-10 MED ORDER — MECLIZINE HCL 25 MG PO TABS
25.0000 mg | ORAL_TABLET | Freq: Three times a day (TID) | ORAL | 0 refills | Status: DC | PRN
Start: 2017-06-10 — End: 2017-07-19

## 2017-06-10 NOTE — Progress Notes (Signed)
Subjective:    Patient ID: [name], female    DOB: Jan 20, 1951, 67 y.o.   MRN: [id number]  HPI  Pt is a 67 yo female with CAD, hx of MI, hyperlipdiemia, HTN who presents to the clinic to be evaluated for dizziness. She has actually had dizziness for a few months. She can certainly correlate it to positional changes. Happens when she stands, leans over, turns over in bed. She has not fallen due to it. She relates it to "being stoned". At times when she is walking she feels alittle dizzy. Denies any ear pain, sinus pressure. She has her ongoing nasal congestion. No fever, chills, body aches. No new medications or dose changes.   .. Active Ambulatory Problems    Diagnosis Date Noted  . DM (diabetes mellitus) (HCC) 06/05/2007  . Mixed hyperlipidemia 09/28/2007  . HYPERCALCEMIA 11/13/2009  . MDD (major depressive disorder), recurrent episode, mild (HCC) 09/28/2007  . FATTY LIVER DISEASE 09/28/2007  . ARTHRITIS, ACROMIOCLAVICULAR 07/31/2008  . SPINAL STENOSIS IN CERVICAL REGION 07/31/2008  . OSTEOPOROSIS 10/17/2007  . Elevated liver function tests 09/28/2007  . Allergic rhinitis 10/06/2010  . Chronic headache 04/03/2011  . Tobacco use 04/03/2011  . Chronic pain 04/03/2011  . Benign hypertension 04/03/2011  . Sacroiliitis (HCC) 09/14/2011  . Arthritis of carpometacarpal joint 02/14/2012  . Diabetes type 2, controlled (HCC) 03/14/2013  . Hypothyroidism 03/14/2013  . Trochanteric bursitis of right hip 05/04/2013  . Anxiety state 05/16/2013  . Ganglion cyst of wrist 07/13/2013  . Right shoulder pain 09/23/2013  . COLD (chronic obstructive lung disease) (HCC) 12/06/2013  . Primary osteoarthritis of left wrist 12/10/2014  . Hyperlipidemia 01/22/2015  . 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 Myocardial infarct (HCC) 03/09/2017  . CAD (coronary artery disease) 03/09/2017  . Rheumatoid arthritis involving both hands (HCC) 03/09/2017  .  Rotator cuff tendonitis, left 03/10/2017  . Anxiety 03/12/2017  . Left upper arm swelling secondary to rotator cuff tear and fluid tracking down biceps tendon sheath 04/14/2017  . Common bile duct dilatation 04/27/2017  . Lumbar degenerative disc disease 05/13/2017  . Abnormal finding on GI tract imaging 05/25/2017  . Chronic back pain 02/27/2016  . Atherosclerosis of native coronary artery of native heart without angina pectoris 03/16/2016  . Bulging lumbar disc 02/27/2016  . RA (rheumatoid arthritis) (HCC) 07/06/2016  . Acquired hypothyroidism 12/01/2015   Resolved Ambulatory Problems    Diagnosis Date Noted  . Chronic pain syndrome 06/05/2007  . HEMORRHOIDS, WITH BLEEDING 04/22/2008  . Acute maxillary sinusitis 04/10/2010  . Alcoholic fatty liver 12/24/2009  . RENAL INSUFFICIENCY, ACUTE 10/23/2008  . ROTATOR CUFF SYNDROME, RIGHT 04/01/2008  . ALLERGIC REACTION 12/24/2009  . ELEVATED BLOOD PRESSURE WITHOUT DIAGNOSIS OF HYPERTENSION 05/13/2010  . CHRONIC OBSTRUCTIVE PULMONARY DISEASE, ACUTE EXACERBATION 08/14/2010  . Recurrent acute sinusitis 09/09/2010  . Allergic dermatitis due to poison ivy 11/27/2010  . DERMATITIS, ATOPIC 01/15/2011  . Ganglion cyst of left foot 07/13/2013  . Chronic pain syndrome 12/05/2013   Past Medical History:  Diagnosis Date  . Cervicalgia   . Chronic pain   . COPD (chronic obstructive pulmonary disease) (HCC)   . DDD (degenerative disc disease), lumbar   . Diabetes mellitus   . Fatty liver   . Hyperlipidemia   . Narcotic abuse (HCC)   . Osteoarthritis   . Osteoporosis   . Thyroid disease         Review of Systems  All other systems reviewed  and are negative.      Objective:   Physical Exam  Constitutional: She is oriented to person, place, and time. She appears well-developed and well-nourished.  HENT:  Head: Normocephalic and atraumatic.  Right Ear: External ear normal.  Left Ear: External ear normal.  Nose: Nose normal.   Mouth/Throat: Oropharynx is clear and moist. No oropharyngeal exudate.  Eyes: Conjunctivae and EOM are normal. Pupils are equal, round, and reactive to light. Right eye exhibits no discharge. Left eye exhibits no discharge.  Neck: Normal range of motion. Neck supple.  Cardiovascular: Normal rate, regular rhythm and normal heart sounds.  Pulmonary/Chest: Effort normal and breath sounds normal. She has no wheezes.  Musculoskeletal:  Upper and lower extremity strength 5/5.   Lymphadenopathy:    She has no cervical adenopathy.  Neurological: She is alert and oriented to person, place, and time. She has normal reflexes. No cranial nerve deficit. Coordination normal.  dix hallpike positive to the right only. With visible nystagmus.  Lots of dizziness when sitting up.     Psychiatric: She has a normal mood and affect. Her behavior is normal.          Assessment & Plan:  Marland KitchenMarland KitchenDiane was seen today for dizziness.  Diagnoses and all orders for this visit:  BPPV (benign paroxysmal positional vertigo), right -     meclizine (ANTIVERT) 25 MG tablet; Take 1 tablet (25 mg total) by mouth 3 (three) times daily as needed for dizziness.   Given epley manuevers to start 3 times a day for next 3 days. antivert as needed. If not improving will get set up for vestibular rehab with PT.  I consider cerubellum stroke due to her CAD, hx of MI, hyperlipidemia; and HTN however, symptoms have been present for a few months and neuro exam was intact. No signs of any URI infection. Follow up as needed.

## 2017-06-13 ENCOUNTER — Other Ambulatory Visit: Payer: Self-pay | Admitting: Physician Assistant

## 2017-06-15 ENCOUNTER — Telehealth: Payer: Self-pay | Admitting: Physician Assistant

## 2017-06-15 NOTE — Telephone Encounter (Signed)
Maurice March from [facility] called and stated that after reviewing what was sent the doctor would like to see the notes or labs that confirm the patients diagnosis of rheumatoid before they can schedule her. - CF

## 2017-06-15 NOTE — Progress Notes (Signed)
Referring-Jade Breeback PA-C Reason for referral-CAD  HPI: 67 year old female for evaluation of coronary artery disease at request of Tandy Gaw PAC. She is status post non-ST elevation myocardial infarction in April 2017 with drug-eluting stent to right coronary artery [facility]). There was moderate disease in the LAD and circumflex by report. Echocardiogram showed ejection fraction 60%.  Patient does have dyspnea on exertion which she attributes to COPD.  No orthopnea, PND, pedal edema, exertional chest pain or syncope.  She has bilateral calf pain with ambulation after 2-3 blocks that she attributes to back problems.  Current Outpatient Medications  Medication Sig Dispense Refill  . alendronate (FOSAMAX) 70 MG tablet TAKE ONE TABLET EVERY 7 DAYS WITH A FULL GLASS OF WATER ON AN EMPTY STOMACH 4 tablet 11  . ALPRAZolam (XANAX) 1 MG tablet TAKE 1TABLET BY MOUTH at bedtime as needed. 30 tablet 5  . aspirin EC 81 MG tablet Take 81 mg by mouth daily.    [name] atorvastatin (LIPITOR) 80 MG tablet TAKE ONE TABLET BY MOUTH EVERY DAY 90 tablet 1  . Cholecalciferol (VITAMIN D3) 1000 units CAPS Take by mouth.    . desvenlafaxine (PRISTIQ) 50 MG 24 hr tablet Take 1 tablet (50 mg total) by mouth daily. 30 tablet 3  . gabapentin (NEURONTIN) 400 MG capsule Take 1 capsule (400 mg total) by mouth 2 (two) times daily. 180 capsule 1  . levothyroxine (SYNTHROID, LEVOTHROID) 137 MCG tablet Take 1 tablet (137 mcg total) by mouth daily before breakfast. 30 tablet 3  . meclizine (ANTIVERT) 25 MG tablet Take 1 tablet (25 mg total) by mouth 3 (three) times daily as needed for dizziness. 30 tablet 0  . metFORMIN (GLUCOPHAGE) 500 MG tablet TAKE 1 TABLET(500 MG) BY MOUTH TWICE DAILY WITH A MEAL. NEED FOLLOW UP APPOINTMENT FOR MORE REFILLS 180 tablet 0  . methotrexate (RHEUMATREX) 15 MG tablet Take 15 mg by mouth once a week. Caution: Chemotherapy. Protect from light.    . metoprolol tartrate (LOPRESSOR) 25 MG tablet  Take 12.5 mg by mouth 2 (two) times daily.    . nitroGLYCERIN (NITROSTAT) 0.4 MG SL tablet Place 0.4 mg under the tongue every 5 (five) minutes as needed for chest pain.    [name] sertraline (ZOLOFT) 50 MG tablet Take 1 tablet (50 mg total) by mouth daily. 90 tablet 1  . traMADol (ULTRAM) 50 MG tablet Take 1 tablet (50 mg total) by mouth every 8 (eight) hours as needed. 20 tablet 0  . traZODone (DESYREL) 50 MG tablet Take 2 tablets (100 mg total) by mouth at bedtime. 60 tablet 5   No current facility-administered medications for this visit.     Allergies  Allergen Reactions  . Codeine Palpitations and Other (See Comments)  . Naproxen Rash and Palpitations    GI upset  . Budesonide-Formoterol Fumarate Rash    REACTION: rash REACTION: rash REACTION: rash  . Duloxetine Hcl Other (See Comments)    No difference for pain or anti-depressant.  Other reaction(s): Other No difference for pain or anti-depressant. No difference for pain or anti-depressant.  . Fentanyl Other (See Comments)    REACTION: severe aggitation, mood changes Other reaction(s): Hallucination, Psychiatric Hallucinations Hallucinations REACTION: severe aggitation, mood changes   . Tramadol-Acetaminophen     Other reaction(s): Unknown  . Effexor Xr [Venlafaxine Hcl Er]     Felt more anxious or no difference.   [name] Fluticasone-Salmeterol      Past Medical History:  Diagnosis Date  . CAD (coronary  artery disease)   . Cervicalgia   . Chronic pain   . COPD (chronic obstructive pulmonary disease) (HCC)   . DDD (degenerative disc disease), lumbar   . Diabetes mellitus   . Fatty liver   . Hyperlipidemia   . Narcotic abuse (HCC)    history  . Osteoarthritis   . Osteoporosis   . Thyroid disease    hypothyroid    Past Surgical History:  Procedure Laterality Date  . BREAST SURGERY     braest lump/ benign  . CHOLECYSTECTOMY    . LUMBAR LAMINECTOMY  05/2010   Dr. Netta Corrigan  . TOTAL KNEE ARTHROPLASTY  2010   right      Social History   Socioeconomic History  . Marital status: Married    Spouse name: Not on file  . Number of children: 2  . Years of education: Not on file  . Highest education level: Not on file  Social Needs  . Financial resource strain: Not on file  . Food insecurity - worry: Not on file  . Food insecurity - inability: Not on file  . Transportation needs - medical: Not on file  . Transportation needs - non-medical: Not on file  Occupational History  . Not on file  Tobacco Use  . Smoking status: Current Every Day Smoker    Packs/day: 1.00    Years: 43.00    Pack years: 43.00    Types: Cigarettes  . Smokeless tobacco: Never Used  . Tobacco comment: Has tried Chantix and Wellbutrin in the past  Substance and Sexual Activity  . Alcohol use: Yes    Alcohol/week: 3.0 oz    Types: 5 Standard drinks or equivalent per week    Comment: 1 beer per day  . Drug use: No  . Sexual activity: Not on file  Other Topics Concern  . Not on file  Social History Narrative  . Not on file    Family History  Problem Relation Age of Onset  . Heart disease Mother 13       MI  . Hyperlipidemia Mother   . Rheum arthritis Mother   . Lymphoma Mother   . Heart disease Father 78       MI  . Diabetes Father   . Rheum arthritis Father   . Cancer Maternal Grandmother        hodgkins lympoma  . Cirrhosis Sister        primary biliary    ROS: arthralgias but no fevers or chills, productive cough, hemoptysis, dysphasia, odynophagia, melena, hematochezia, dysuria, hematuria, rash, seizure activity, orthopnea, PND, pedal edema. Remaining systems are negative.  Physical Exam:   Blood pressure 107/70, pulse 74, height 5\' 3"  (1.6 m), weight 130 lb 6.4 oz (59.1 kg).  General:  Well developed/well nourished in NAD Skin warm/dry Patient not depressed No peripheral clubbing Back-normal HEENT-normal/normal eyelids Neck supple/normal carotid upstroke bilaterally; no bruits; no JVD; no  thyromegaly chest -diminished breath sounds throughout and mild expiratory wheeze CV - RRR/normal S1 and S2; no murmurs, rubs or gallops;  PMI nondisplaced Abdomen -NT/ND, no HSM, no mass, + bowel sounds, no bruit 2+ femoral pulses, no bruits Ext-no edema, no chords; diminished distal pulses Neuro-grossly nonfocal  ECG -normal sinus rhythm, cannot rule out prior septal infarct.  Personally reviewed  A/P  1 coronary artery disease-patient is status post PCI in April 2017.  Will discontinue Plavix.  Continue aspirin 81 mg daily.  Continue statin and beta-blocker.  2 hyperlipidemia-continue statin.  3 tobacco abuse-patient counseled on discontinuing.  4 possible claudication-patient describes possible bilateral claudication.  However she attributes this to back pain.  We discussed proceeding with ABIs but she declined.  Olga Millers, MD

## 2017-06-15 NOTE — Telephone Encounter (Signed)
Call pt and see who made diagnoses. She lived in [place] and [place] for a while if there perhaps we could get records. I do not see them in media.

## 2017-06-17 NOTE — Telephone Encounter (Signed)
I called patient she is going to call rheumatologist from [facility] and have notes sent to [place] once received I will send to [facility] so patient can be scheduled. Patient is aware of this as well - CF

## 2017-06-19 ENCOUNTER — Other Ambulatory Visit: Payer: Self-pay | Admitting: Physician Assistant

## 2017-06-19 DIAGNOSIS — F5101 Primary insomnia: Secondary | ICD-10-CM

## 2017-06-22 ENCOUNTER — Telehealth: Payer: Self-pay | Admitting: *Deleted

## 2017-06-22 ENCOUNTER — Ambulatory Visit (INDEPENDENT_AMBULATORY_CARE_PROVIDER_SITE_OTHER): Payer: Medicare Other | Admitting: Cardiology

## 2017-06-22 ENCOUNTER — Encounter: Payer: Self-pay | Admitting: Cardiology

## 2017-06-22 VITALS — BP 107/70 | HR 74 | Ht 63.0 in | Wt 130.4 lb

## 2017-06-22 DIAGNOSIS — Z720 Tobacco use: Secondary | ICD-10-CM

## 2017-06-22 DIAGNOSIS — E7800 Pure hypercholesterolemia, unspecified: Secondary | ICD-10-CM | POA: Diagnosis not present

## 2017-06-22 DIAGNOSIS — I2510 Atherosclerotic heart disease of native coronary artery without angina pectoris: Secondary | ICD-10-CM | POA: Diagnosis not present

## 2017-06-22 NOTE — Telephone Encounter (Signed)
   [facility] Pre-operative Risk Assessment    Request for surgical clearance:  1. What type of surgery is being performed? Endoscopy and/or colonoscopy   2. When is this surgery scheduled? 07/01/17   3. What type of clearance is required (medical clearance vs. Pharmacy clearance to hold med vs. Both)? Medical  4. Are there any medications that need to be held prior to surgery and how long?Plavix   5. Practice name and name of physician performing surgery? [facility]   6. What is your office phone and fax number? [phone]   7. Anesthesia type (None, local, MAC, general) ?    Vesna Kable A Kenzlee Fishburn 06/22/2017, 2:27 PM  _________________________________________________________________   (provider comments below)   Appt 1/23 with Dr. Stanford Breed

## 2017-06-22 NOTE — Patient Instructions (Signed)
Your physician wants you to follow-up in: ONE YEAR WITH DR CRENSHAW You will receive a reminder letter in the mail two months in advance. If you don't receive a letter, please call our office to schedule the follow-up appointment.   If you need a refill on your cardiac medications before your next appointment, please call your pharmacy.  

## 2017-06-22 NOTE — Telephone Encounter (Signed)
   Primary Cardiologist: Olga Millers, MD  Chart reviewed as part of pre-operative protocol coverage. Given past medical history and time since last visit (06/22/2017), based on ACC/AHA guidelines, [name] would be at acceptable risk for the planned procedure without further cardiovascular testing.   Her Plavix was discontinued per Dr. Ludwig Clarks note as of 06/22/2017.   I will route this recommendation to the requesting party via Epic fax function and remove from pre-op pool.  Please call with questions.  Norma Fredrickson, NP 06/22/2017, 3:07 PM

## 2017-06-22 NOTE — Telephone Encounter (Signed)
Faxed Dr. Deborah Chalk note to [facility].

## 2017-06-24 NOTE — Telephone Encounter (Signed)
New message  Marchelle Folks verbalized that she is returning call for RN

## 2017-06-24 NOTE — Telephone Encounter (Signed)
Returned call- requesting clearance be faxed to [phone], attn: Hyman Bower  Refaxed Dr. Jens Som office note and Norma Fredrickson NP clearance note via Epic to # requested.

## 2017-07-01 DIAGNOSIS — K5710 Diverticulosis of small intestine without perforation or abscess without bleeding: Secondary | ICD-10-CM | POA: Diagnosis not present

## 2017-07-01 DIAGNOSIS — E11618 Type 2 diabetes mellitus with other diabetic arthropathy: Secondary | ICD-10-CM | POA: Diagnosis not present

## 2017-07-01 DIAGNOSIS — Z96651 Presence of right artificial knee joint: Secondary | ICD-10-CM | POA: Diagnosis not present

## 2017-07-01 DIAGNOSIS — M549 Dorsalgia, unspecified: Secondary | ICD-10-CM | POA: Diagnosis not present

## 2017-07-01 DIAGNOSIS — F329 Major depressive disorder, single episode, unspecified: Secondary | ICD-10-CM | POA: Diagnosis not present

## 2017-07-01 DIAGNOSIS — Z9049 Acquired absence of other specified parts of digestive tract: Secondary | ICD-10-CM | POA: Diagnosis not present

## 2017-07-01 DIAGNOSIS — Z955 Presence of coronary angioplasty implant and graft: Secondary | ICD-10-CM | POA: Diagnosis not present

## 2017-07-01 DIAGNOSIS — G8929 Other chronic pain: Secondary | ICD-10-CM | POA: Diagnosis not present

## 2017-07-01 DIAGNOSIS — K760 Fatty (change of) liver, not elsewhere classified: Secondary | ICD-10-CM | POA: Diagnosis not present

## 2017-07-01 DIAGNOSIS — J449 Chronic obstructive pulmonary disease, unspecified: Secondary | ICD-10-CM | POA: Diagnosis not present

## 2017-07-01 DIAGNOSIS — K219 Gastro-esophageal reflux disease without esophagitis: Secondary | ICD-10-CM | POA: Diagnosis not present

## 2017-07-01 DIAGNOSIS — K449 Diaphragmatic hernia without obstruction or gangrene: Secondary | ICD-10-CM | POA: Diagnosis not present

## 2017-07-01 DIAGNOSIS — K2970 Gastritis, unspecified, without bleeding: Secondary | ICD-10-CM | POA: Diagnosis not present

## 2017-07-01 DIAGNOSIS — Z886 Allergy status to analgesic agent status: Secondary | ICD-10-CM | POA: Diagnosis not present

## 2017-07-01 DIAGNOSIS — Z7951 Long term (current) use of inhaled steroids: Secondary | ICD-10-CM | POA: Diagnosis not present

## 2017-07-01 DIAGNOSIS — Z885 Allergy status to narcotic agent status: Secondary | ICD-10-CM | POA: Diagnosis not present

## 2017-07-01 DIAGNOSIS — Z7982 Long term (current) use of aspirin: Secondary | ICD-10-CM | POA: Diagnosis not present

## 2017-07-01 DIAGNOSIS — R933 Abnormal findings on diagnostic imaging of other parts of digestive tract: Secondary | ICD-10-CM | POA: Diagnosis not present

## 2017-07-01 DIAGNOSIS — Z888 Allergy status to other drugs, medicaments and biological substances status: Secondary | ICD-10-CM | POA: Diagnosis not present

## 2017-07-01 DIAGNOSIS — F17210 Nicotine dependence, cigarettes, uncomplicated: Secondary | ICD-10-CM | POA: Diagnosis not present

## 2017-07-01 DIAGNOSIS — E785 Hyperlipidemia, unspecified: Secondary | ICD-10-CM | POA: Diagnosis not present

## 2017-07-01 DIAGNOSIS — M069 Rheumatoid arthritis, unspecified: Secondary | ICD-10-CM | POA: Diagnosis not present

## 2017-07-01 DIAGNOSIS — E039 Hypothyroidism, unspecified: Secondary | ICD-10-CM | POA: Diagnosis not present

## 2017-07-01 DIAGNOSIS — Z7902 Long term (current) use of antithrombotics/antiplatelets: Secondary | ICD-10-CM | POA: Diagnosis not present

## 2017-07-01 DIAGNOSIS — I252 Old myocardial infarction: Secondary | ICD-10-CM | POA: Diagnosis not present

## 2017-07-19 ENCOUNTER — Encounter: Payer: Self-pay | Admitting: Physician Assistant

## 2017-07-19 ENCOUNTER — Ambulatory Visit (INDEPENDENT_AMBULATORY_CARE_PROVIDER_SITE_OTHER): Payer: Medicare Other | Admitting: Physician Assistant

## 2017-07-19 VITALS — BP 124/63 | HR 73 | Ht 63.0 in | Wt 129.0 lb

## 2017-07-19 DIAGNOSIS — M79661 Pain in right lower leg: Secondary | ICD-10-CM | POA: Diagnosis not present

## 2017-07-19 DIAGNOSIS — R413 Other amnesia: Secondary | ICD-10-CM | POA: Diagnosis not present

## 2017-07-19 DIAGNOSIS — I2510 Atherosclerotic heart disease of native coronary artery without angina pectoris: Secondary | ICD-10-CM | POA: Diagnosis not present

## 2017-07-19 DIAGNOSIS — M0579 Rheumatoid arthritis with rheumatoid factor of multiple sites without organ or systems involvement: Secondary | ICD-10-CM

## 2017-07-19 DIAGNOSIS — Z23 Encounter for immunization: Secondary | ICD-10-CM | POA: Diagnosis not present

## 2017-07-19 DIAGNOSIS — E039 Hypothyroidism, unspecified: Secondary | ICD-10-CM | POA: Diagnosis not present

## 2017-07-19 DIAGNOSIS — M5136 Other intervertebral disc degeneration, lumbar region: Secondary | ICD-10-CM

## 2017-07-19 DIAGNOSIS — M79662 Pain in left lower leg: Secondary | ICD-10-CM

## 2017-07-19 DIAGNOSIS — F331 Major depressive disorder, recurrent, moderate: Secondary | ICD-10-CM

## 2017-07-19 MED ORDER — PREDNISONE 50 MG PO TABS: ORAL_TABLET | ORAL | 0 refills | Status: DC

## 2017-07-19 MED ORDER — AMBULATORY NON FORMULARY MEDICATION: 0 refills | Status: DC

## 2017-07-19 MED ORDER — METHOTREXATE SODIUM 15 MG PO TABS: 15.0000 mg | ORAL_TABLET | ORAL | 1 refills | Status: DC

## 2017-07-19 MED ORDER — DESVENLAFAXINE SUCCINATE ER 100 MG PO TB24: 100.0000 mg | ORAL_TABLET | Freq: Every day | ORAL | 3 refills | Status: DC

## 2017-07-19 MED ORDER — GABAPENTIN 600 MG PO TABS: ORAL_TABLET | ORAL | 3 refills | Status: DC

## 2017-07-19 NOTE — Progress Notes (Signed)
Subjective:    Patient ID: [name], female    DOB: Apr 10, 1951, 67 y.o.   MRN: [id number]  HPI  Pt is a 37 you ol female with MI 2017, CAD, prediabetes, chronic pain  MDD who presents to the clinic for follow up.   Hypothyroidism-patient has not had her labs checked in a while.  She is on levothyroxine with no concerns.  She does feel like her overall mood is more depressed.  She feels that she does not have any motivation to keep going.  She denies any suicidal or homicidal thoughts.  She was started on Pristiq at last visit but she does not feel it is helping much.  She does not feel any benefit as far as pain as well.  She has appointment with rheumatology next week.  She does need a refill methotrexate until she can get into that appointment.  She is also being seen by pain clinic.  When her rheumatoid arthritis and Lumbar DDD flares up in multiple joints.  She is previously been given a burst of prednisone.  She does request that burst today.  Patient also mentions some bilateral calf pain.  She does feel like it is worse when she stands and at times when she walks.  She continues to smoke.  She is certainly high risk due to her past history.  She is tried magnesium with little benefit.   She mentions some memory concerns.   .. Active Ambulatory Problems    Diagnosis Date Noted  . DM (diabetes mellitus) (HCC) 06/05/2007  . Mixed hyperlipidemia 09/28/2007  . HYPERCALCEMIA 11/13/2009  . MDD (major depressive disorder), recurrent episode, mild (HCC) 09/28/2007  . FATTY LIVER DISEASE 09/28/2007  . ARTHRITIS, ACROMIOCLAVICULAR 07/31/2008  . SPINAL STENOSIS IN CERVICAL REGION 07/31/2008  . OSTEOPOROSIS 10/17/2007  . Elevated liver function tests 09/28/2007  . Allergic rhinitis 10/06/2010  . Chronic headache 04/03/2011  . Tobacco use 04/03/2011  . Chronic pain 04/03/2011  . Benign hypertension 04/03/2011  . Sacroiliitis (HCC) 09/14/2011  . Arthritis of carpometacarpal joint  02/14/2012  . Diabetes type 2, controlled (HCC) 03/14/2013  . Hypothyroidism 03/14/2013  . Trochanteric bursitis of right hip 05/04/2013  . Anxiety state 05/16/2013  . Ganglion cyst of wrist 07/13/2013  . Right shoulder pain 09/23/2013  . COLD (chronic obstructive lung disease) (HCC) 12/06/2013  . Primary osteoarthritis of left wrist 12/10/2014  . Hyperlipidemia 01/22/2015  . 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 Myocardial infarct (HCC) 03/09/2017  . CAD (coronary artery disease) 03/09/2017  . Rheumatoid arthritis involving both hands (HCC) 03/09/2017  . Rotator cuff tendonitis, left 03/10/2017  . Anxiety 03/12/2017  . Left upper arm swelling secondary to rotator cuff tear and fluid tracking down biceps tendon sheath 04/14/2017  . Common bile duct dilatation 04/27/2017  . Lumbar degenerative disc disease 05/13/2017  . Abnormal finding on GI tract imaging 05/25/2017  . Chronic back pain 02/27/2016  . Atherosclerosis of native coronary artery of native heart without angina pectoris 03/16/2016  . Bulging lumbar disc 02/27/2016  . RA (rheumatoid arthritis) (HCC) 07/06/2016  . Acquired hypothyroidism 12/01/2015   Resolved Ambulatory Problems    Diagnosis Date Noted  . Chronic pain syndrome 06/05/2007  . HEMORRHOIDS, WITH BLEEDING 04/22/2008  . Acute maxillary sinusitis 04/10/2010  . Alcoholic fatty liver 12/24/2009  . RENAL INSUFFICIENCY, ACUTE 10/23/2008  . ROTATOR CUFF SYNDROME, RIGHT 04/01/2008  . ALLERGIC REACTION 12/24/2009  . ELEVATED BLOOD PRESSURE WITHOUT DIAGNOSIS  OF HYPERTENSION 05/13/2010  . CHRONIC OBSTRUCTIVE PULMONARY DISEASE, ACUTE EXACERBATION 08/14/2010  . Recurrent acute sinusitis 09/09/2010  . Allergic dermatitis due to poison ivy 11/27/2010  . DERMATITIS, ATOPIC 01/15/2011  . Ganglion cyst of left foot 07/13/2013  . Chronic pain syndrome 12/05/2013   Past Medical History:  Diagnosis Date  . CAD (coronary  artery disease)   . Cervicalgia   . Chronic pain   . COPD (chronic obstructive pulmonary disease) (HCC)   . DDD (degenerative disc disease), lumbar   . Diabetes mellitus   . Fatty liver   . Hyperlipidemia   . Narcotic abuse (HCC)   . Osteoarthritis   . Osteoporosis   . Rheumatoid arthritis (HCC)   . Thyroid disease      Review of Systems  All other systems reviewed and are negative.      Objective:   Physical Exam  Constitutional: She is oriented to person, place, and time. She appears well-developed and well-nourished.  HENT:  Head: Normocephalic and atraumatic.  Cardiovascular: Normal rate, regular rhythm and normal heart sounds.  Pulmonary/Chest: Effort normal and breath sounds normal.  Abdominal: Soft. Bowel sounds are normal. There is no tenderness.  Neurological: She is alert and oriented to person, place, and time.  Skin: No rash noted.  Psychiatric: She has a normal mood and affect. Her behavior is normal.          Assessment & Plan:  Marland KitchenMarland KitchenDiagnoses and all orders for this visit:  Acquired hypothyroidism -     TSH  Lumbar degenerative disc disease -     gabapentin (NEURONTIN) 600 MG tablet; One tablet PO BID -     predniSONE (DELTASONE) 50 MG tablet; One tab PO daily for 5 days.  Rheumatoid arthritis involving multiple sites with positive rheumatoid factor (HCC) -     methotrexate (RHEUMATREX) 15 MG tablet; Take 1 tablet (15 mg total) by mouth once a week. Caution: Chemotherapy. Protect from light.  Memory changes -     B12  Need for pneumococcal vaccination -     Pneumococcal conjugate vaccine 13-valent  Coronary artery disease involving native heart without angina pectoris, unspecified vessel or lesion type -     VAS [place] ABI WITH/WO TBI; Future -     VAS [place] ABI WITH/WO TBI  Need for shingles vaccine -     AMBULATORY NON FORMULARY MEDICATION; shingrix 2 dose series for shingles prevention.  Moderate episode of recurrent major depressive disorder  (HCC) -     desvenlafaxine (PRISTIQ) 100 MG 24 hr tablet; Take 1 tablet (100 mg total) by mouth daily.  Bilateral calf pain -     VAS [place] ABI WITH/WO TBI; Future -     VAS [place] ABI WITH/WO TBI   Labs ordered.   cologuard ordered. Pneumonia vaccines up to date. shingrix ordered to have done at local pharmacy.   Pt is certainly high risk for PVD. Pain in calves with standing/walking rasies concern of claudication. Will get ABIs.   Increased pristiq to 100mg  follow up in 4 weeks.   Prednisone burst given for Lumbar DDD/RA. Follow up asneeded.   Follow up for memory changes.

## 2017-07-22 DIAGNOSIS — R413 Other amnesia: Secondary | ICD-10-CM | POA: Diagnosis not present

## 2017-07-22 DIAGNOSIS — E039 Hypothyroidism, unspecified: Secondary | ICD-10-CM | POA: Diagnosis not present

## 2017-07-22 LAB — TSH: TSH: 0.11 mIU/L — ABNORMAL LOW (ref 0.40–4.50)

## 2017-07-23 LAB — VITAMIN B12: Vitamin B-12: 583 pg/mL (ref 200–1100)

## 2017-07-24 ENCOUNTER — Encounter: Payer: Self-pay | Admitting: Physician Assistant

## 2017-07-24 DIAGNOSIS — F331 Major depressive disorder, recurrent, moderate: Secondary | ICD-10-CM | POA: Insufficient documentation

## 2017-07-24 DIAGNOSIS — G3184 Mild cognitive impairment, so stated: Secondary | ICD-10-CM | POA: Insufficient documentation

## 2017-07-24 DIAGNOSIS — M79661 Pain in right lower leg: Secondary | ICD-10-CM | POA: Insufficient documentation

## 2017-07-24 DIAGNOSIS — M79662 Pain in left lower leg: Secondary | ICD-10-CM

## 2017-07-24 DIAGNOSIS — R413 Other amnesia: Secondary | ICD-10-CM | POA: Insufficient documentation

## 2017-07-24 NOTE — Progress Notes (Signed)
Call pt: b12 looks great. If you are taking the 150 we need to bump you down to the next dose . I see that was prescribed at one time. Does she still have those talbets. TSH is low meaning too much thyroid hormone circulating.

## 2017-07-25 ENCOUNTER — Other Ambulatory Visit: Payer: Self-pay | Admitting: Physician Assistant

## 2017-07-25 DIAGNOSIS — E039 Hypothyroidism, unspecified: Secondary | ICD-10-CM

## 2017-07-25 MED ORDER — LEVOTHYROXINE SODIUM 137 MCG PO TABS: 137.0000 ug | ORAL_TABLET | Freq: Every day | ORAL | 1 refills | Status: DC

## 2017-07-25 NOTE — Progress Notes (Signed)
Sent. Recheck in 4 weeks. Lab only.

## 2017-08-02 ENCOUNTER — Ambulatory Visit: Payer: Medicare Other

## 2017-08-02 DIAGNOSIS — M19041 Primary osteoarthritis, right hand: Secondary | ICD-10-CM | POA: Diagnosis not present

## 2017-08-02 DIAGNOSIS — M19042 Primary osteoarthritis, left hand: Secondary | ICD-10-CM | POA: Diagnosis not present

## 2017-08-02 DIAGNOSIS — Z79899 Other long term (current) drug therapy: Secondary | ICD-10-CM | POA: Diagnosis not present

## 2017-08-02 DIAGNOSIS — M0579 Rheumatoid arthritis with rheumatoid factor of multiple sites without organ or systems involvement: Secondary | ICD-10-CM | POA: Diagnosis not present

## 2017-08-18 ENCOUNTER — Other Ambulatory Visit: Payer: Self-pay | Admitting: Family Medicine

## 2017-08-21 ENCOUNTER — Other Ambulatory Visit: Payer: Self-pay | Admitting: Physician Assistant

## 2017-08-21 DIAGNOSIS — F419 Anxiety disorder, unspecified: Secondary | ICD-10-CM

## 2017-08-23 ENCOUNTER — Ambulatory Visit: Payer: Medicare Other | Admitting: Physician Assistant

## 2017-08-29 DIAGNOSIS — Z1212 Encounter for screening for malignant neoplasm of rectum: Secondary | ICD-10-CM | POA: Diagnosis not present

## 2017-08-29 DIAGNOSIS — Z1211 Encounter for screening for malignant neoplasm of colon: Secondary | ICD-10-CM | POA: Diagnosis not present

## 2017-08-29 NOTE — Telephone Encounter (Signed)
Printed to fax  

## 2017-09-01 DIAGNOSIS — I10 Essential (primary) hypertension: Secondary | ICD-10-CM | POA: Diagnosis not present

## 2017-09-01 LAB — COMPLETE METABOLIC PANEL WITHOUT GFR
AG Ratio: 1.7 (calc) (ref 1.0–2.5)
AST: 20 U/L (ref 10–35)
Alkaline phosphatase (APISO): 109 U/L (ref 33–130)
GFR, Est Non African American: 78 mL/min/1.73m2 (ref >=60)
Globulin: 2.6 g/dL (ref 1.9–3.7)
Potassium: 4.5 mmol/L (ref 3.5–5.3)
Sodium: 139 mmol/L (ref 135–146)

## 2017-09-01 LAB — COMPLETE METABOLIC PANEL WITH GFR
ALT: 22 U/L (ref 6–29)
Albumin: 4.3 g/dL (ref 3.6–5.1)
BUN: 15 mg/dL (ref 7–25)
CO2: 29 mmol/L (ref 20–32)
Calcium: 9.3 mg/dL (ref 8.6–10.4)
Chloride: 104 mmol/L (ref 98–110)
Creat: 0.78 mg/dL (ref 0.50–0.99)
GFR, Est African American: 91 mL/min/{1.73_m2} (ref >=60)
Glucose, Bld: 166 mg/dL — ABNORMAL HIGH (ref 65–99)
Total Bilirubin: 0.4 mg/dL (ref 0.2–1.2)
Total Protein: 6.9 g/dL (ref 6.1–8.1)

## 2017-09-02 DIAGNOSIS — E119 Type 2 diabetes mellitus without complications: Secondary | ICD-10-CM | POA: Diagnosis not present

## 2017-09-02 DIAGNOSIS — H524 Presbyopia: Secondary | ICD-10-CM | POA: Diagnosis not present

## 2017-09-02 LAB — HM DIABETES EYE EXAM

## 2017-09-07 ENCOUNTER — Encounter: Payer: Self-pay | Admitting: Family Medicine

## 2017-09-07 ENCOUNTER — Ambulatory Visit (INDEPENDENT_AMBULATORY_CARE_PROVIDER_SITE_OTHER): Payer: Medicare Other | Admitting: Family Medicine

## 2017-09-07 ENCOUNTER — Encounter: Payer: Self-pay | Admitting: Physician Assistant

## 2017-09-07 VITALS — BP 147/75 | HR 105 | Ht 63.0 in | Wt 131.0 lb

## 2017-09-07 DIAGNOSIS — M545 Low back pain, unspecified: Secondary | ICD-10-CM

## 2017-09-07 DIAGNOSIS — M79605 Pain in left leg: Secondary | ICD-10-CM

## 2017-09-07 DIAGNOSIS — M62830 Muscle spasm of back: Secondary | ICD-10-CM

## 2017-09-07 DIAGNOSIS — S39012A Strain of muscle, fascia and tendon of lower back, initial encounter: Secondary | ICD-10-CM

## 2017-09-07 DIAGNOSIS — H04123 Dry eye syndrome of bilateral lacrimal glands: Secondary | ICD-10-CM | POA: Insufficient documentation

## 2017-09-07 DIAGNOSIS — H269 Unspecified cataract: Secondary | ICD-10-CM | POA: Insufficient documentation

## 2017-09-07 DIAGNOSIS — I2510 Atherosclerotic heart disease of native coronary artery without angina pectoris: Secondary | ICD-10-CM

## 2017-09-07 MED ORDER — PREDNISONE 5 MG (48) PO TBPK: ORAL_TABLET | ORAL | 0 refills | Status: DC

## 2017-09-07 NOTE — Patient Instructions (Signed)
Thank you for coming in today. Attend PT.  Take the prednisone  Recheck in 4 weeks.  If worsening see me sooner.  Come back or go to the emergency room if you notice new weakness new numbness problems walking or bowel or bladder problems.   Lumbosacral Strain Lumbosacral strain is an injury that causes pain in the lower back (lumbosacral spine). This injury usually occurs from overstretching the muscles or ligaments along your spine. A strain can affect one or more muscles or cord-like tissues that connect bones to other bones (ligaments). What are the causes? This condition may be caused by:  A hard, direct hit (blow) to the back.  Excessive stretching of the lower back muscles. This may result from: ? A fall. ? Lifting something heavy. ? Repetitive movements such as bending or crouching.  What increases the risk? The following factors may increase your risk of getting this condition:  Participating in sports or activities that involve: ? A sudden twist of the back. ? Pushing or pulling motions.  Being overweight or obese.  Having poor strength and flexibility, especially tight hamstrings or weak muscles in the back or abdomen.  Having too much of a curve in the lower back.  Having a pelvis that is tilted forward.  What are the signs or symptoms? The main symptom of this condition is pain in the lower back, at the site of the strain. Pain may extend (radiate) down one or both legs. How is this diagnosed? This condition is diagnosed based on:  Your symptoms.  Your medical history.  A physical exam. ? Your health care provider may push on certain areas of your back to determine the source of your pain. ? You may be asked to bend forward, backward, and side to side to assess the severity of your pain and your range of motion.  Imaging tests, such as: ? X-rays. ? MRI.  How is this treated? Treatment for this condition may include:  Putting heat and cold on the  affected area.  Medicines to help relieve pain and relax your muscles (muscle relaxants).  NSAIDs to help reduce swelling and discomfort.  When your symptoms improve, it is important to gradually return to your normal routine as soon as possible to reduce pain, avoid stiffness, and avoid loss of muscle strength. Generally, symptoms should improve within 6 weeks of treatment. However, recovery time varies. Follow these instructions at home: Managing pain, stiffness, and swelling   If directed, put ice on the injured area during the first 24 hours after your strain. ? Put ice in a plastic bag. ? Place a towel between your skin and the bag. ? Leave the ice on for 20 minutes, 2-3 times a day.  If directed, put heat on the affected area as often as told by your health care provider. Use the heat source that your health care provider recommends, such as a moist heat pack or a heating pad. ? Place a towel between your skin and the heat source. ? Leave the heat on for 20-30 minutes. ? Remove the heat if your skin turns bright red. This is especially important if you are unable to feel pain, heat, or cold. You may have a greater risk of getting burned. Activity  Rest and return to your normal activities as told by your health care provider. Ask your health care provider what activities are safe for you.  Avoid activities that take a lot of energy for as long as told by  your health care provider. General instructions  Take over-the-counter and prescription medicines only as told by your health care provider.  Donot drive or use heavy machinery while taking prescription pain medicine.  Do not use any products that contain nicotine or tobacco, such as cigarettes and e-cigarettes. If you need help quitting, ask your health care provider.  Keep all follow-up visits as told by your health care provider. This is important. How is this prevented?  Use correct form when playing sports and lifting  heavy objects.  Use good posture when sitting and standing.  Maintain a healthy weight.  Sleep on a mattress with medium firmness to support your back.  Be safe and responsible while being active to avoid falls.  Do at least 150 minutes of moderate-intensity exercise each week, such as brisk walking or water aerobics. Try a form of exercise that takes stress off your back, such as swimming or stationary cycling.  Maintain physical fitness, including: ? Strength. ? Flexibility. ? Cardiovascular fitness. ? Endurance. Contact a health care provider if:  Your back pain does not improve after 6 weeks of treatment.  Your symptoms get worse. Get help right away if:  Your back pain is severe.  You cannot stand or walk.  You have difficulty controlling when you urinate or when you have a bowel movement.  You feel nauseous or you vomit.  Your feet get very cold.  You have numbness, tingling, weakness, or problems using your arms or legs.  You develop any of the following: ? Shortness of breath. ? Dizziness. ? Pain in your legs. ? Weakness in your buttocks or legs. ? Discoloration of the skin on your toes or legs. This information is not intended to replace advice given to you by your health care provider. Make sure you discuss any questions you have with your health care provider. Document Released: 02/24/2005 Document Revised: 12/05/2015 Document Reviewed: 10/19/2015 Elsevier Interactive Patient Education  [employer].

## 2017-09-07 NOTE — Progress Notes (Signed)
CHARNISHA FERREYRA is a 67 y.o. female who presents to [facility] today for back pain.  Kenni notes new onset of low back and thoracic back pain.  She notes the left low back pain has been present for a few days and she developed mid thoracic back pain this morning.  She notes some pain radiating down the anterior thigh associated with new onset of back pain.  She denies any weakness or numbness bowel or bladder dysfunction.  She denies any injury fevers or chills.  Her medical history is pertinent for rheumatoid arthritis and osteoporosis.  She is tried some over-the-counter medication for pain which have not helped much.    Past Medical History:  Diagnosis Date  . CAD (coronary artery disease)   . Cervicalgia   . Chronic pain   . COPD (chronic obstructive pulmonary disease) (HCC)   . DDD (degenerative disc disease), lumbar   . Diabetes mellitus   . Fatty liver   . Hyperlipidemia   . Narcotic abuse (HCC)    history  . Osteoarthritis   . Osteoporosis   . Rheumatoid arthritis (HCC)   . Thyroid disease    hypothyroid   Past Surgical History:  Procedure Laterality Date  . BREAST SURGERY     braest lump/ benign  . CHOLECYSTECTOMY    . LUMBAR LAMINECTOMY  05/2010   Dr. Netta Corrigan  . TOTAL KNEE ARTHROPLASTY  2010   right   Social History   Tobacco Use  . Smoking status: Current Every Day Smoker    Packs/day: 1.00    Years: 43.00    Pack years: 43.00    Types: Cigarettes  . Smokeless tobacco: Never Used  . Tobacco comment: Has tried Chantix and Wellbutrin in the past  Substance Use Topics  . Alcohol use: Yes    Alcohol/week: 3.0 oz    Types: 5 Standard drinks or equivalent per week    Comment: 1 beer per day     ROS:  As above   Medications: Current Outpatient Medications  Medication Sig Dispense Refill  . alendronate (FOSAMAX) 70 MG tablet TAKE ONE TABLET EVERY 7 DAYS WITH A FULL GLASS OF WATER ON AN EMPTY STOMACH 4 tablet 11    . ALPRAZolam (XANAX) 1 MG tablet TAKE ONE TABLET BY MOUTH AT BEDTIME AS NEEDED 30 tablet 2  . AMBULATORY NON FORMULARY MEDICATION shingrix 2 dose series for shingles prevention. 1 application 0  . aspirin EC 81 MG tablet Take 81 mg by mouth daily.    [name] atorvastatin (LIPITOR) 80 MG tablet TAKE ONE TABLET BY MOUTH EVERY DAY 90 tablet 1  . Cholecalciferol (VITAMIN D3) 1000 units CAPS Take by mouth.    . desvenlafaxine (PRISTIQ) 100 MG 24 hr tablet Take 1 tablet (100 mg total) by mouth daily. 30 tablet 3  . gabapentin (NEURONTIN) 400 MG capsule Take 1 capsule (400 mg total) by mouth 2 (two) times daily. 180 capsule 1  . gabapentin (NEURONTIN) 600 MG tablet One tablet PO BID 60 tablet 3  . levothyroxine (SYNTHROID, LEVOTHROID) 137 MCG tablet Take 1 tablet (137 mcg total) by mouth daily before breakfast. 30 tablet 1  . metFORMIN (GLUCOPHAGE) 500 MG tablet TAKE 1 TABLET(500 MG) BY MOUTH TWICE DAILY WITH A MEAL. NEED FOLLOW UP APPOINTMENT FOR MORE REFILLS 180 tablet 0  . methotrexate (RHEUMATREX) 15 MG tablet Take 1 tablet (15 mg total) by mouth once a week. Caution: Chemotherapy. Protect from light. 4 tablet 1  .  metoprolol tartrate (LOPRESSOR) 25 MG tablet Take 12.5 mg by mouth 2 (two) times daily.    . nitroGLYCERIN (NITROSTAT) 0.4 MG SL tablet Place 0.4 mg under the tongue every 5 (five) minutes as needed for chest pain.    . traZODone (DESYREL) 50 MG tablet Take 2 tablets (100 mg total) by mouth at bedtime. 60 tablet 5  . predniSONE (STERAPRED UNI-PAK 48 TAB) 5 MG (48) TBPK tablet 12 day dosepack po 48 tablet 0   No current facility-administered medications for this visit.    Allergies  Allergen Reactions  . Codeine Palpitations and Other (See Comments)  . Naproxen Rash and Palpitations    GI upset  . Budesonide-Formoterol Fumarate Rash    REACTION: rash REACTION: rash REACTION: rash  . Duloxetine Hcl Other (See Comments)    No difference for pain or anti-depressant.  Other reaction(s):  Other No difference for pain or anti-depressant. No difference for pain or anti-depressant.  . Fentanyl Other (See Comments)    REACTION: severe aggitation, mood changes Other reaction(s): Hallucination, Psychiatric Hallucinations Hallucinations REACTION: severe aggitation, mood changes   . Tramadol-Acetaminophen     Other reaction(s): Unknown  . Effexor Xr [Venlafaxine Hcl Er]     Felt more anxious or no difference.   . Fluticasone-Salmeterol      Exam:  BP (!) 147/75   Pulse (!) 105   Ht 5\' 3"  (1.6 m)   Wt 131 lb (59.4 kg)   BMI 23.21 kg/m  General: Well Developed, well nourished, and in no acute distress.  Neuro/Psych: Alert and oriented x3, extra-ocular muscles intact, able to move all 4 extremities, sensation grossly intact. Skin: Warm and dry, no rashes noted.  Respiratory: Not using accessory muscles, speaking in full sentences, trachea midline.  Cardiovascular: Pulses palpable, no extremity edema. Abdomen: Does not appear distended. MSK:  T-spine: Nontender to spinal midline.  Tender to palpation bilateral thoracic paraspinal muscle group. L-spine: Nontender to midline.  Tender palpation left lumbar paraspinal muscle group and SI joint region. Thoracic and lumbar motion is significantly limited by pain. Lower extremity strength reflexes and sensation are intact. Mild antalgic gait present.  EXAM: MRI LUMBAR SPINE WITHOUT AND WITH CONTRAST  TECHNIQUE: Multiplanar and multiecho pulse sequences of the lumbar spine were obtained without and with intravenous contrast.  CONTRAST:  69mL MULTIHANCE GADOBENATE DIMEGLUMINE 529 MG/ML IV SOLN  COMPARISON:  02/27/2011  FINDINGS: There is mild lumbar dextroscoliosis. There is no significant listhesis. There is progressive, severe disc space narrowing at L3-4, greater on the left, with associated degenerative marrow changes. Scattered, mild degenerative marrow changes are also noted at the thoracolumbar junction and  at L5-S1. There is diffuse lumbar disc desiccation. Scattered, small Schmorl's nodes are noted. Conus medullaris is normal in signal and terminates at L1-2. Paraspinal soft tissues are unremarkable.  T11-12: Only imaged sagittally. New/increased right central disc extrusion without definite stenosis.  T12-L1:  Minimal disc bulge without stenosis.  L1-2:  Mild circumferential disc bulge without stenosis, unchanged.  L2-3: Mild disc bulge and mild facet hypertrophy without stenosis. Disc bulging has slightly increased from prior.  L3-4: Sequelae of prior laminectomies are again identified with postoperative enhancement at the laminectomy bed. Disc bulging, endplate spurring, and facet hypertrophy result in mild left lateral recess stenosis, mild right neural foraminal stenosis, and severe left neural foraminal stenosis, not significantly changed. No spinal canal stenosis.  L4-5: Disc bulge and mild facet hypertrophy result in mild right and moderate left neural foraminal stenosis without spinal canal  stenosis, unchanged.  L5-S1: New, moderately large right foraminal disc extrusion results in severe neural foraminal stenosis with mass effect upon the right L5 nerve root. Moderate right and mild left facet hypertrophy. No spinal canal or left neural foraminal stenosis.  IMPRESSION: 1. New right foraminal disc extrusion at L5-S1 affecting the right L5 nerve root. 2. Unchanged appearance of L3-4 with prior posterior decompression. Persistent, severe left neural foraminal stenosis. 3. Increased disc degeneration at T11-12 and L2-3 without stenosis.   Electronically Signed   By: Sebastian Ache   On: 02/24/2014 16:13  I personally (independently) visualized and performed the interpretation of the images attached in this note.     Assessment and Plan: 67 y.o. female with  Thoracic and lumbar pain very likely myofascial spasm and strain.  Patient also likely has L3  lumbar radiculopathy which is much more mild.  We discussed treatment options.  Plan for trial of prednisone Dosepak for lumbar radiculopathy symptoms as well as physical therapy for myofascial pain and spasm.  Recheck in a few weeks if not improving.  Return sooner if needed.   Orders Placed This Encounter  Procedures  . Ambulatory referral to Physical Therapy    Referral Priority:   Routine    Referral Type:   Physical Medicine    Referral Reason:   Specialty Services Required    Requested Specialty:   Physical Therapy   Meds ordered this encounter  Medications  . predniSONE (STERAPRED UNI-PAK 48 TAB) 5 MG (48) TBPK tablet    Sig: 12 day dosepack po    Dispense:  48 tablet    Refill:  0    Discussed warning signs or symptoms. Please see discharge instructions. Patient expresses understanding.

## 2017-09-14 ENCOUNTER — Encounter: Payer: Self-pay | Admitting: Physician Assistant

## 2017-09-14 DIAGNOSIS — K760 Fatty (change of) liver, not elsewhere classified: Secondary | ICD-10-CM | POA: Diagnosis not present

## 2017-09-14 DIAGNOSIS — Z1211 Encounter for screening for malignant neoplasm of colon: Secondary | ICD-10-CM | POA: Diagnosis not present

## 2017-09-14 DIAGNOSIS — R945 Abnormal results of liver function studies: Secondary | ICD-10-CM | POA: Diagnosis not present

## 2017-09-14 DIAGNOSIS — R933 Abnormal findings on diagnostic imaging of other parts of digestive tract: Secondary | ICD-10-CM | POA: Diagnosis not present

## 2017-09-23 LAB — COLOGUARD: Cologuard: POSITIVE

## 2017-09-26 ENCOUNTER — Telehealth: Payer: Self-pay

## 2017-09-26 ENCOUNTER — Encounter: Payer: Self-pay | Admitting: Physical Therapy

## 2017-09-26 ENCOUNTER — Ambulatory Visit (INDEPENDENT_AMBULATORY_CARE_PROVIDER_SITE_OTHER): Payer: Medicare Other | Admitting: Physical Therapy

## 2017-09-26 DIAGNOSIS — R293 Abnormal posture: Secondary | ICD-10-CM

## 2017-09-26 DIAGNOSIS — M542 Cervicalgia: Secondary | ICD-10-CM | POA: Diagnosis not present

## 2017-09-26 DIAGNOSIS — M62830 Muscle spasm of back: Secondary | ICD-10-CM

## 2017-09-26 DIAGNOSIS — G8929 Other chronic pain: Secondary | ICD-10-CM | POA: Diagnosis not present

## 2017-09-26 DIAGNOSIS — M545 Low back pain, unspecified: Secondary | ICD-10-CM

## 2017-09-26 DIAGNOSIS — R195 Other fecal abnormalities: Secondary | ICD-10-CM

## 2017-09-26 NOTE — Therapy (Addendum)
[facility]  [address] [place] [place], [place], [address] Phone: [phone]   Fax:  [phone]  Physical Therapy Evaluation  Patient Details  Name: [name] MRN: [id number] Date of Birth: 12/28/1950 Referring Provider: Dr Lynne Leader   Encounter Date: 09/26/2017  PT End of Session - 09/26/17 1420    Visit Number  1    Number of Visits  6    Date for PT Re-Evaluation  11/07/17    PT Start Time  8938    PT Stop Time  1520    PT Time Calculation (min)  60 min    Activity Tolerance  Patient tolerated treatment well       Past Medical History:  Diagnosis Date  . CAD (coronary artery disease)   . Cervicalgia   . Chronic pain   . COPD (chronic obstructive pulmonary disease) ([place])   . DDD (degenerative disc disease), lumbar   . Diabetes mellitus   . Fatty liver   . Hyperlipidemia   . Narcotic abuse ([place])    history  . Osteoarthritis   . Osteoporosis   . Rheumatoid arthritis ([place])   . Thyroid disease    hypothyroid    Past Surgical History:  Procedure Laterality Date  . BREAST SURGERY     braest lump/ benign  . CHOLECYSTECTOMY    . LUMBAR LAMINECTOMY  05/2010   Dr. Rushie Nyhan  . TOTAL KNEE ARTHROPLASTY  2010   right    There were no vitals filed for this visit.   Subjective Assessment - 09/26/17 1420    Subjective  Pt reports that her low back is always a problem, the thoracic spine isn't any more however her neck is hurting her now. Reports several spurs in her spine.  States she does her own exercises, BKTC stretch, bycyle legs, LTR,     Currently in Pain?  Yes    Pain Score  6     Pain Location  Back    Pain Orientation  Left;Right    Pain Descriptors / Indicators  Dull;Sharp    Pain Type  Chronic pain    Pain Onset  More than a month ago    Pain Frequency  Constant    Aggravating Factors   walking    Pain Relieving Factors  medications - however is limited on this. Sleep and heat.     Multiple  Pain Sites  Yes    Pain Score  5    Pain Location  Neck    Pain Orientation  Mid    Pain Descriptors / Indicators  Dull;Aching    Pain Type  Chronic pain    Pain Onset  More than a month ago    Pain Frequency  Intermittent    Aggravating Factors   walking    Pain Relieving Factors  medication and heat         [facility] PT Assessment - 09/26/17 0001      Assessment   Medical Diagnosis  Lumbosacral and thoracic strain    Referring Provider  Dr Lynne Leader    Onset Date/Surgical Date  05/31/10    Hand Dominance  Right    Next MD Visit  PRN    Prior Therapy  in the past      Precautions   Precautions  None      Balance Screen   Has the patient fallen in the past 6 months  Yes    How many times?  unsure has intermittent falls    Has the patient had a decrease in activity level because of a fear of falling?   No    Is the patient reluctant to leave their home because of a fear of falling?   No      Home Environment   Living Environment  Private residence    Home Access  Level entry      Prior Function   Level of [place]  Retired    Leisure  watch her soaps, read and short dog walk      Posture/Postural Control   Posture/Postural Control  Postural limitations    Postural Limitations  Rounded Shoulders;Forward head;Decreased lumbar lordosis;Increased thoracic kyphosis      ROM / Strength   AROM / PROM / Strength  AROM;Strength      AROM   Overall AROM Comments  shoulder WNL, even with Lt RTC injury    AROM Assessment Site  Lumbar;Cervical    Cervical Flexion  to chest    Cervical Extension  WNL    Cervical - Right Rotation  76    Cervical - Left Rotation  81    Lumbar Flexion  to floor    Lumbar Extension  50% with some pain    Lumbar - Right Rotation  WNL    Lumbar - Left Rotation  WNL      Strength   Overall Strength Comments  multifidi good contraction    Strength Assessment Site  Shoulder;Hip;Knee    Right/Left Shoulder  -- Rt WNL,  Lt grossly 4+/5    Right/Left Hip  -- bilat grossly 4+/5    Right/Left Knee  -- WNL      Flexibility   Soft Tissue Assessment /Muscle Length  yes bilat hip flexors tight    Hamstrings  WNL    Quadriceps  prone knee flex Rt 120, Lt 115 ( TKA )     Piriformis  tight Rt side      Palpation   Spinal mobility  hypomobile in spine, painful in lower cervical/upper thoracic and lower lumbar and sacrum.     Palpation comment  very tight and tender in bila lumbar paraspinals, some into the Rt piriformis.  Very tight periocciput bilat.                 Objective measurements completed on examination: See above findings.      [place] [facility] Adult PT Treatment/Exercise - 09/26/17 0001      Exercises   Exercises  Other Exercises    Other Exercises   decompression position,, head and shoulder presses, leg lengtheners and leg presses, seated scapular retraction and cervical retraction      Modalities   Modalities  -- pt declined reports she can heat at home and doesnt like sti      Manual Therapy   Manual Therapy  Soft tissue mobilization;Myofascial release;Manual Traction    Soft tissue mobilization  STM lumbar to cervical, bilat QLs SCM    Myofascial Release  occipital base release    Manual Traction  cervical gentle             PT Education - 09/26/17 1521    Education provided  Yes    Education Details  HEP and DN    Person(s) Educated  Patient    Methods  Explanation;Demonstration;Handout    Comprehension  Verbalized understanding;Returned demonstration          PT Long  Term Goals - 09/26/17 1526      PT LONG TERM GOAL #1   Title  I with advanced HEP for postural correction ( 11/07/17)     Time  6    Period  Weeks    Status  New      PT LONG TERM GOAL #2   Title  report =/> 50% reduction of overall back/neck pain with daily activity ( 11/07/17)     Time  6    Period  Weeks    Status  New      PT LONG TERM GOAL #3   Title  improve FOTO =/< 44% limited ( 11/07/17)      Time  6    Period  Weeks    Status  New      PT LONG TERM GOAL #4   Title  demo cervical and lumbar ROM WFL with no more than 1/10 pain ( 11/07/17)     Time  6    Period  Weeks    Status  New             Plan - 09/26/17 1522    Clinical Impression Statement  67 yo female presents with long h/o LBP that moves around into her neck and thoracic area.  She has stetches she performs at home to help ease some of the pain.  [name] reports she doesn't really want PT she just wants to get a massage to relax her muscles.  It was recommended that she reach out for massage therapy once PT is over.  She has significant postural changes that place stress on her back/muscles.  Multiple areas of tightness mainly in the lumbar paraspinals however also periocciput. She would benefit from PT for postural correction exercise, manaul work to decrease spasms and education of safe movement patterns.     History and Personal Factors relevant to plan of care:  multiple medical dx and surgeries - refer to snap shot for full list.     Clinical Presentation  Stable    Clinical Decision Making  Low    Rehab Potential  Good    PT Frequency  1x / week    PT Duration  6 weeks    PT Treatment/Interventions  Dry needling;Manual techniques;Moist Heat;Ultrasound;Therapeutic activities;Patient/family education;Therapeutic exercise;Cryotherapy;Electrical Stimulation;Passive range of motion    PT Next Visit Plan  progress back of the body strengthening, chest openers, DN to lumbar paraspinals if pt is open and manual work.  She has had estim in the past with limited improvements so hold on this.     Consulted and Agree with Plan of Care  Patient       Patient will benefit from skilled therapeutic intervention in order to improve the following deficits and impairments:  Pain, Postural dysfunction, Increased muscle spasms, Decreased activity tolerance, Decreased strength, Difficulty walking  Visit Diagnosis: Chronic  bilateral low back pain without sciatica - Plan: PT plan of care cert/re-cert  Cervicalgia - Plan: PT plan of care cert/re-cert  Abnormal posture - Plan: PT plan of care cert/re-cert  Muscle spasm of back - Plan: PT plan of care cert/re-cert     Problem List Patient Active Problem List   Diagnosis Date Noted  . Bilateral cataracts 09/07/2017  . Dry eye syndrome of bilateral lacrimal glands 09/07/2017  . Moderate episode of recurrent major depressive disorder ([facility] [place]) 07/24/2017  . Bilateral calf pain 07/24/2017  . Memory changes 07/24/2017  . Abnormal finding on GI tract imaging 05/25/2017  .  Lumbar degenerative disc disease 05/13/2017  . Common bile duct dilatation 04/27/2017  . Left upper arm swelling secondary to rotator cuff tear and fluid tracking down biceps tendon sheath 04/14/2017  . Anxiety 03/12/2017  . Rotator cuff tendonitis, left 03/10/2017  . Myocardial infarct ([place]) 03/09/2017  . CAD (coronary artery disease) 03/09/2017  . Rheumatoid arthritis involving both hands ([place]) 03/09/2017  . RA (rheumatoid arthritis) ([place]) 07/06/2016  . Atherosclerosis of native coronary artery of native heart without angina pectoris 03/16/2016  . Chronic back pain 02/27/2016  . Bulging lumbar disc 02/27/2016  . Acquired hypothyroidism 12/01/2015  . COPD (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 ([place]) 03/05/2015  . Hyperlipidemia 01/22/2015  . Primary osteoarthritis of left wrist 12/10/2014  . COLD (chronic obstructive lung disease) ([place]) 12/06/2013  . Right shoulder pain 09/23/2013  . Ganglion cyst of wrist 07/13/2013  . Anxiety state 05/16/2013  . Trochanteric bursitis of right hip 05/04/2013  . Diabetes type 2, controlled ([place]) 03/14/2013  . Hypothyroidism 03/14/2013  . Arthritis of carpometacarpal joint 02/14/2012  . Sacroiliitis ([place]) 09/14/2011  . Chronic headache 04/03/2011  . Tobacco use 04/03/2011  . Chronic pain 04/03/2011  . Benign hypertension  04/03/2011  . Allergic rhinitis 10/06/2010  . HYPERCALCEMIA 11/13/2009  . ARTHRITIS, ACROMIOCLAVICULAR 07/31/2008  . SPINAL STENOSIS IN CERVICAL REGION 07/31/2008  . OSTEOPOROSIS 10/17/2007  . Mixed hyperlipidemia 09/28/2007  . MDD (major depressive disorder), recurrent episode, mild ([place]) 09/28/2007  . FATTY LIVER DISEASE 09/28/2007  . Elevated liver function tests 09/28/2007  . DM (diabetes mellitus) ([place]) 06/05/2007    Jeral Pinch PT  09/26/2017, 3:49 PM  [facility] [place] [address] [place], [place], [address] Phone: [phone]   Fax:  [phone]  Name: [name] MRN: [id number] Date of Birth: 1950-08-11   PHYSICAL THERAPY DISCHARGE SUMMARY  Visits from Start of Care: 1  Current functional level related to goals / functional outcomes: Unknown, patient never returned for treatment   Remaining deficits: unknown   Education / Equipment: Initial HEP Plan:                                                    Patient goals were not met. Patient is being discharged due to not returning since the last visit.  ?????    Jeral Pinch, PT 11/02/17 7:50 AM

## 2017-09-26 NOTE — Telephone Encounter (Signed)
Per provider's request, called pt and advised her that we received positive Cologuard results. Advised pt that she needs colonoscopy.  Pt agreeable and did not have any further questions.  Pt requests GI in [place] who is NOT Dr Rhetta Mura.   Referral placed.

## 2017-09-26 NOTE — Patient Instructions (Addendum)
Perform these once a day.   Decompression Exercise: Basic - use a pillow under your head for all of these exercises.    Lie on back on firm surface, knees bent, feet flat, arms turned up, out to sides, backs of hands down. Time _1-10__ minutes. Surface: floor  Shoulder Press   Press both shoulders down. Hold _5__ seconds. Repeat _10__ times.  Surface: floor   Head Press With Chin Tuck   Tuck chin SLIGHTLY toward chest, keep mouth closed. Feel weight on back of head. Increase weight by pressing head down. Hold _5__ seconds. Relax. Repeat _10__ times. Surface: floor   Leg Lengthener: Full   Straighten one leg. Pull toes AND forefoot toward knee, extend heel. Lengthen leg by pulling pelvis away from ribs. Hold _5__ seconds. Relax. Repeat 10 times. Re-bend knee. Do other leg.  Surface: floor   Leg Press: Single   Straighten one leg down to floor. Bring toes AND forefoot toward knee, extend heel. Press leg down. DO NOT BEND KNEE. Hold _5__ seconds. Relax leg. Repeat exercise 10times. Relax leg. Re-bend knee. Repeat with other leg.  Other exercise to perform on/off during the day: Scapular Retraction (Standing)    With arms at sides, pinch shoulder blades together. Repeat __5-10_ times per set. Do _1___ sets per session. Do __multiple__ sessions per day.   Flexibility: Neck Retraction    Pull head straight back, keeping eyes and jaw level. Repeat _5-10___ times per set. Do _1___ sets per session. Do __multiple__ sessions per day.   Trigger Point Dry Needling  . What is Trigger Point Dry Needling (DN)? o DN is a physical therapy technique used to treat muscle pain and dysfunction. Specifically, DN helps deactivate muscle trigger points (muscle knots).  o A thin filiform needle is used to penetrate the skin and stimulate the underlying trigger point. The goal is for a local twitch response (LTR) to occur and for the trigger point to relax. No medication of any kind is  injected during the procedure.   . What Does Trigger Point Dry Needling Feel Like?  o The procedure feels different for each individual patient. Some patients report that they do not actually feel the needle enter the skin and overall the process is not painful. Very mild bleeding may occur. However, many patients feel a deep cramping in the muscle in which the needle was inserted. This is the local twitch response.   [name] How Will I feel after the treatment? o Soreness is normal, and the onset of soreness may not occur for a few hours. Typically this soreness does not last longer than two days.  o Bruising is uncommon, however; ice can be used to decrease any possible bruising.  o In rare cases feeling tired or nauseous after the treatment is normal. In addition, your symptoms may get worse before they get better, this period will typically not last longer than 24 hours.   . What Can I do After My Treatment? o Increase your hydration by drinking more water for the next 24 hours. o You may place ice or heat on the areas treated that have become sore, however, do not use heat on inflamed or bruised areas. Heat often brings more relief post needling. o You can continue your regular activities, but vigorous activity is not recommended initially after the treatment for 24 hours. o DN is best combined with other physical therapy such as strengthening, stretching, and other therapies.

## 2017-10-04 ENCOUNTER — Encounter: Payer: Self-pay | Admitting: Physician Assistant

## 2017-10-04 ENCOUNTER — Ambulatory Visit (INDEPENDENT_AMBULATORY_CARE_PROVIDER_SITE_OTHER): Payer: Medicare Other | Admitting: Physician Assistant

## 2017-10-04 VITALS — BP 135/58 | HR 75 | Ht 63.0 in | Wt 133.0 lb

## 2017-10-04 DIAGNOSIS — R195 Other fecal abnormalities: Secondary | ICD-10-CM | POA: Diagnosis not present

## 2017-10-04 DIAGNOSIS — E119 Type 2 diabetes mellitus without complications: Secondary | ICD-10-CM | POA: Diagnosis not present

## 2017-10-04 DIAGNOSIS — R809 Proteinuria, unspecified: Secondary | ICD-10-CM | POA: Diagnosis not present

## 2017-10-04 DIAGNOSIS — I2510 Atherosclerotic heart disease of native coronary artery without angina pectoris: Secondary | ICD-10-CM | POA: Diagnosis not present

## 2017-10-04 DIAGNOSIS — R29898 Other symptoms and signs involving the musculoskeletal system: Secondary | ICD-10-CM | POA: Diagnosis not present

## 2017-10-04 LAB — POCT UA - MICROALBUMIN
CREATININE, POC: 50 mg/dL
MICROALBUMIN (UR) POC: 10 mg/L

## 2017-10-04 LAB — POCT GLYCOSYLATED HEMOGLOBIN (HGB A1C): Hemoglobin A1C: 6.3

## 2017-10-04 MED ORDER — METFORMIN HCL 500 MG PO TABS: ORAL_TABLET | ORAL | 1 refills | Status: DC

## 2017-10-04 MED ORDER — LISINOPRIL 2.5 MG PO TABS: 2.5000 mg | ORAL_TABLET | Freq: Every day | ORAL | 1 refills | Status: DC

## 2017-10-04 NOTE — Progress Notes (Signed)
Subjective:    Patient ID: [name], female    DOB: January 18, 1951, 67 y.o.   MRN: [id number]  HPI 67 year old female presents for follow-up of type II DM. She has been compliant with her medications. Denies any hypoglycemia. No open sores or wounds. Not checking sugars.   She also describes 2 episodes of feeling like her legs were giving out. Both of these episodes were at home and she was able to hang onto something so that she did not fall. The episodes are during the day time and do not occur when she's tired or has been walking for a long time.  She describes the weakness as being primarily between her knees and her hips (in her quads), but she thinks it could be coming from her chronic back pain, which she feels is relatively well controlled at present. She denies radiating numbness or tingling going down her legs.   .. Active Ambulatory Problems    Diagnosis Date Noted  . DM (diabetes mellitus) ([facility]) 06/05/2007  . Mixed hyperlipidemia 09/28/2007  . HYPERCALCEMIA 11/13/2009  . MDD (major depressive disorder), recurrent episode, mild ([facility]) 09/28/2007  . FATTY LIVER DISEASE 09/28/2007  . ARTHRITIS, ACROMIOCLAVICULAR 07/31/2008  . SPINAL STENOSIS IN CERVICAL REGION 07/31/2008  . OSTEOPOROSIS 10/17/2007  . Elevated liver function tests 09/28/2007  . Allergic rhinitis 10/06/2010  . Chronic headache 04/03/2011  . Tobacco use 04/03/2011  . Chronic pain 04/03/2011  . Benign hypertension 04/03/2011  . Sacroiliitis ([facility]) 09/14/2011  . Arthritis of carpometacarpal joint 02/14/2012  . Diabetes type 2, controlled ([facility]) 03/14/2013  . Hypothyroidism 03/14/2013  . Trochanteric bursitis of right hip 05/04/2013  . Anxiety state 05/16/2013  . Ganglion cyst of wrist 07/13/2013  . Right shoulder pain 09/23/2013  . COLD (chronic obstructive lung disease) ([facility]) 12/06/2013  . Primary osteoarthritis of left wrist 12/10/2014  . Hyperlipidemia 01/22/2015  . COPD (chronic obstructive pulmonary  disease) with chronic bronchitis ([facility]) 03/05/2015  . Myocardial infarct ([facility]) 03/09/2017  . CAD (coronary artery disease) 03/09/2017  . Rheumatoid arthritis involving both hands ([facility]) 03/09/2017  . Rotator cuff tendonitis, left 03/10/2017  . Anxiety 03/12/2017  . Left upper arm swelling secondary to rotator cuff tear and fluid tracking down biceps tendon sheath 04/14/2017  . Common bile duct dilatation 04/27/2017  . Lumbar degenerative disc disease 05/13/2017  . Abnormal finding on GI tract imaging 05/25/2017  . Chronic back pain 02/27/2016  . Atherosclerosis of native coronary artery of native heart without angina pectoris 03/16/2016  . Bulging lumbar disc 02/27/2016  . RA (rheumatoid arthritis) ([facility]) 07/06/2016  . Acquired hypothyroidism 12/01/2015  . Moderate episode of recurrent major depressive disorder ([facility]) 07/24/2017  . Bilateral calf pain 07/24/2017  . Memory changes 07/24/2017  . Bilateral cataracts 09/07/2017  . Dry eye syndrome of bilateral lacrimal glands 09/07/2017  . Positive colorectal cancer screening using Cologuard test 10/07/2017  . Microalbuminuria 10/07/2017  . Weakness of both lower extremities 10/07/2017   Resolved Ambulatory Problems    Diagnosis Date Noted  . Chronic pain syndrome 06/05/2007  . HEMORRHOIDS, WITH BLEEDING 04/22/2008  . Acute maxillary sinusitis 04/10/2010  . Alcoholic fatty liver 12/24/2009  . RENAL INSUFFICIENCY, ACUTE 10/23/2008  . ROTATOR CUFF SYNDROME, RIGHT 04/01/2008  . ALLERGIC REACTION 12/24/2009  . ELEVATED BLOOD PRESSURE WITHOUT DIAGNOSIS OF HYPERTENSION 05/13/2010  . CHRONIC OBSTRUCTIVE PULMONARY DISEASE, ACUTE EXACERBATION 08/14/2010  . Recurrent acute sinusitis 09/09/2010  . Allergic dermatitis due to poison ivy 11/27/2010  . DERMATITIS, ATOPIC 01/15/2011  .  Ganglion cyst of left foot 07/13/2013  . Chronic pain syndrome 12/05/2013   Past Medical History:  Diagnosis Date  . CAD (coronary artery disease)   . Cervicalgia    . Chronic pain   . COPD (chronic obstructive pulmonary disease) ([facility])   . DDD (degenerative disc disease), lumbar   . Diabetes mellitus   . Fatty liver   . Hyperlipidemia   . Narcotic abuse ([facility])   . Osteoarthritis   . Osteoporosis   . Rheumatoid arthritis ([facility])   . Thyroid disease       Review of Systems  All other systems reviewed and are negative.      Objective:   Physical Exam  Constitutional: She is oriented to person, place, and time. She appears well-developed and well-nourished.  HENT:  Head: Normocephalic and atraumatic.  Eyes: Pupils are equal, round, and reactive to light. Conjunctivae and EOM are normal.  Cardiovascular: Normal rate, regular rhythm, normal heart sounds and intact distal pulses.  Pulmonary/Chest: Effort normal and breath sounds normal. No respiratory distress.  Musculoskeletal:  Lower extremity strength 4/5 today.  Negative straight leg.   Neurological: She is alert and oriented to person, place, and time.  Skin: Skin is warm and dry.  Psychiatric: She has a normal mood and affect. Her behavior is normal. Thought content normal.      Assessment & Plan:  Marland KitchenMarland KitchenDiane was seen today for diabetes.  Diagnoses and all orders for this visit:  Controlled type 2 diabetes mellitus without complication, without long-term current use of insulin ([facility]) -     POCT HgB A1C -     POCT UA - Microalbumin -     metFORMIN (GLUCOPHAGE) 500 MG tablet; TAKE 1 TABLET(500 MG) BY MOUTH TWICE DAILY WITH A MEAL  Positive colorectal cancer screening using Cologuard test  Microalbuminuria -     lisinopril (PRINIVIL,ZESTRIL) 2.5 MG tablet; Take 1 tablet (2.5 mg total) by mouth daily.  .. Results for orders placed or performed in visit on 10/04/17  POCT HgB A1C  Result Value Ref Range   Hemoglobin A1C 6.3   POCT UA - Microalbumin  Result Value Ref Range   Microalbumin Ur, POC 10 mg/L   Creatinine, POC 50 mg/dL   Albumin/Creatinine Ratio, Urine, POC 30-300     Type II DM well controlled, continue metformin 500 mg BID.  Added ACE low dose. BP controlled.Discussed the importance of taking this medication for renal protection.  On statin.  Vaccines up to date.  Eye exam up to date.   Pt is scheduled for colonoscopy.   Discussed weakness in her legs. Will send note to PT to add lower extremity strengthen  to the plan. Discussed using a cane for stability especially in the morning. Discussed red flags for sooner follow up.  Follow up as needed.   Follow up in 3 months.

## 2017-10-06 ENCOUNTER — Encounter: Payer: Medicare Other | Admitting: Physical Therapy

## 2017-10-07 DIAGNOSIS — R195 Other fecal abnormalities: Secondary | ICD-10-CM | POA: Insufficient documentation

## 2017-10-07 DIAGNOSIS — R29898 Other symptoms and signs involving the musculoskeletal system: Secondary | ICD-10-CM | POA: Insufficient documentation

## 2017-10-07 DIAGNOSIS — R809 Proteinuria, unspecified: Secondary | ICD-10-CM | POA: Insufficient documentation

## 2017-10-11 ENCOUNTER — Ambulatory Visit (INDEPENDENT_AMBULATORY_CARE_PROVIDER_SITE_OTHER): Payer: Medicare Other | Admitting: Family Medicine

## 2017-10-11 ENCOUNTER — Encounter: Payer: Self-pay | Admitting: Family Medicine

## 2017-10-11 VITALS — BP 155/65 | HR 92 | Wt 133.0 lb

## 2017-10-11 DIAGNOSIS — Z1159 Encounter for screening for other viral diseases: Secondary | ICD-10-CM

## 2017-10-11 DIAGNOSIS — G8929 Other chronic pain: Secondary | ICD-10-CM | POA: Diagnosis not present

## 2017-10-11 DIAGNOSIS — M545 Low back pain, unspecified: Secondary | ICD-10-CM

## 2017-10-11 DIAGNOSIS — R21 Rash and other nonspecific skin eruption: Secondary | ICD-10-CM | POA: Diagnosis not present

## 2017-10-11 DIAGNOSIS — I2510 Atherosclerotic heart disease of native coronary artery without angina pectoris: Secondary | ICD-10-CM

## 2017-10-11 DIAGNOSIS — E039 Hypothyroidism, unspecified: Secondary | ICD-10-CM | POA: Diagnosis not present

## 2017-10-11 DIAGNOSIS — Z23 Encounter for immunization: Secondary | ICD-10-CM | POA: Diagnosis not present

## 2017-10-11 MED ORDER — TRIAMCINOLONE ACETONIDE 0.1 % EX CREA: 1.0000 "application " | TOPICAL_CREAM | Freq: Two times a day (BID) | CUTANEOUS | 0 refills | Status: DC

## 2017-10-11 MED ORDER — LEVOTHYROXINE SODIUM 137 MCG PO TABS: 137.0000 ug | ORAL_TABLET | Freq: Every day | ORAL | 0 refills | Status: DC

## 2017-10-11 MED ORDER — PREDNISONE 5 MG (48) PO TBPK: ORAL_TABLET | ORAL | 0 refills | Status: DC

## 2017-10-11 NOTE — Progress Notes (Signed)
[name] is a 67 y.o. female with a medical history significant for rheumatoid arthritis and osteoporosis who presents to [facility] today for   Back pain: Patient states she strained her back several days ago. It is on the right lower back and is non-radiating. It is worse with movement and back extension. She denies numbness or tingling in her feet. She denies bowel or bladder dysfunction. She denies trauma. This is consistent with her history of back pain. She states steroid injections or oral steroids have helped her with pain previously. She is currently seeing a physical therapist.   Rash: Patient reports having a rash on her inner forearms for 4-5 days. It is not itchy or painful. She denies fever or shortness of breath. She has not applied anything to it. She notes she recently started lisinopril 5 days ago. She denies recent changes to her soaps, detergents, or other hygiene products.  No bites.  No new cosmetics. No body aches.  No involvement in her mucocutaneous surfaces.  Patient notes that she thinks that she never had measles or mumps as a child and wants to know if she needs a MMR booster.   Past Medical History:  Diagnosis Date  . CAD (coronary artery disease)   . Cervicalgia   . Chronic pain   . COPD (chronic obstructive pulmonary disease) ([place])   . DDD (degenerative disc disease), lumbar   . Diabetes mellitus   . Fatty liver   . Hyperlipidemia   . Narcotic abuse ([place])    history  . Osteoarthritis   . Osteoporosis   . Rheumatoid arthritis ([place])   . Thyroid disease    hypothyroid   Past Surgical History:  Procedure Laterality Date  . BREAST SURGERY     braest lump/ benign  . CHOLECYSTECTOMY    . LUMBAR LAMINECTOMY  05/2010   Dr. Rushie Nyhan  . TOTAL KNEE ARTHROPLASTY  2010   right   Social History   Tobacco Use  . Smoking status:  Current Every Day Smoker    Packs/day: 1.00    Years: 43.00    Pack years: 43.00    Types: Cigarettes  . Smokeless tobacco: Never Used  . Tobacco comment: Has tried Chantix and Wellbutrin in the past  Substance Use Topics  . Alcohol use: Yes    Alcohol/week: 3.0 oz    Types: 5 Standard drinks or equivalent per week    Comment: 1 beer per day   family history includes Cancer in her maternal grandmother; Cirrhosis in her sister; Diabetes in her father; Heart disease (age of onset: 67) in her father and mother; Hyperlipidemia in her mother; Lymphoma in her mother; Rheum arthritis in her father and mother.  ROS as above:  Medications: Current Outpatient Medications  Medication Sig Dispense Refill  . alendronate (FOSAMAX) 70 MG tablet TAKE ONE TABLET EVERY 7 DAYS WITH A FULL GLASS OF WATER ON AN EMPTY STOMACH 4 tablet 11  . ALPRAZolam (XANAX) 1 MG tablet TAKE ONE TABLET BY MOUTH AT BEDTIME AS NEEDED 30 tablet 2  . AMBULATORY NON FORMULARY MEDICATION shingrix 2 dose series for shingles prevention. 1 application 0  . aspirin EC 81 MG tablet Take 81 mg by mouth daily.    [name] atorvastatin (LIPITOR) 80 MG tablet TAKE ONE TABLET BY MOUTH EVERY DAY 90 tablet 1  . cetirizine (ZYRTEC) 10 MG tablet Take 10 mg by mouth daily.    [name]  Cholecalciferol (VITAMIN D3) 1000 units CAPS Take by mouth.    . desvenlafaxine (PRISTIQ) 100 MG 24 hr tablet Take 1 tablet (100 mg total) by mouth daily. 30 tablet 3  . gabapentin (NEURONTIN) 400 MG capsule Take 1 capsule (400 mg total) by mouth 2 (two) times daily. 180 capsule 1  . gabapentin (NEURONTIN) 600 MG tablet One tablet PO BID 60 tablet 3  . levothyroxine (SYNTHROID, LEVOTHROID) 137 MCG tablet Take 1 tablet (137 mcg total) by mouth daily before breakfast. 90 tablet 0  . lisinopril (PRINIVIL,ZESTRIL) 2.5 MG tablet Take 1 tablet (2.5 mg total) by mouth daily. 90 tablet 1  . metFORMIN (GLUCOPHAGE) 500 MG tablet TAKE 1 TABLET(500 MG) BY MOUTH TWICE DAILY WITH A MEAL  180 tablet 1  . methotrexate (RHEUMATREX) 15 MG tablet Take 1 tablet (15 mg total) by mouth once a week. Caution: Chemotherapy. Protect from light. 4 tablet 1  . metoprolol tartrate (LOPRESSOR) 25 MG tablet Take 12.5 mg by mouth 2 (two) times daily.    . nitroGLYCERIN (NITROSTAT) 0.4 MG SL tablet Place 0.4 mg under the tongue every 5 (five) minutes as needed for chest pain.    . traZODone (DESYREL) 50 MG tablet Take 2 tablets (100 mg total) by mouth at bedtime. 60 tablet 5  . predniSONE (STERAPRED UNI-PAK 48 TAB) 5 MG (48) TBPK tablet 12 day dosepack po 48 tablet 0  . triamcinolone cream (KENALOG) 0.1 % Apply 1 application topically 2 (two) times daily. 453.6 g 0   No current facility-administered medications for this visit.    Allergies  Allergen Reactions  . Codeine Palpitations and Other (See Comments)  . Naproxen Rash and Palpitations    GI upset  . Budesonide-Formoterol Fumarate Rash    REACTION: rash REACTION: rash REACTION: rash  . Duloxetine Hcl Other (See Comments)    No difference for pain or anti-depressant.  Other reaction(s): Other No difference for pain or anti-depressant. No difference for pain or anti-depressant.  . Fentanyl Other (See Comments)    REACTION: severe aggitation, mood changes Other reaction(s): Hallucination, Psychiatric Hallucinations Hallucinations REACTION: severe aggitation, mood changes   . Tramadol-Acetaminophen     Other reaction(s): Unknown  . Effexor Xr [Venlafaxine Hcl Er]     Felt more anxious or no difference.   . Fluticasone-Salmeterol   . Lisinopril Rash    Maybe we dont really know    Health Maintenance Health Maintenance  Topic Date Due  . Hepatitis C Screening  [birth year]  . MAMMOGRAM  12/25/2016  . COLONOSCOPY  07/19/2018 (Originally 08/18/1999)  . TETANUS/TDAP  10/16/2017  . INFLUENZA VACCINE  12/29/2017  . HEMOGLOBIN A1C  04/06/2018  . PNA vac Low Risk Adult (2 of 2 - PPSV23) 07/19/2018  . OPHTHALMOLOGY EXAM   09/03/2018  . FOOT EXAM  10/05/2018  . DEXA SCAN  Completed     Exam:  BP (!) 155/65   Pulse 92   Wt 133 lb (60.3 kg)   BMI 23.56 kg/m  Gen: Well NAD HEENT: EOMI,  MMM Lungs: Normal work of breathing. CTABL Heart: RRR no MRG Abd: NABS, Soft. Nondistended, Nontender Exts: Brisk capillary refill, warm and well perfused.  Skin: Diffuse sandpapery macular rash on the insides of both forearms.   MSK:  L-spine: nontender to midline. Tender to palpation right lumbar paraspinal muscle group and SI joint region.  Lumbar extension motion and twisting motions significantly limited by pain.  Lower extremity strength reflexes and sensation are intact.  Mild antalgic gait  present.    No results found for this or any previous visit (from the past 72 hour(s)). No results found.    Assessment and Plan: 67 y.o. female with   Back pain: Patient has a long history of back pain. Today she has strained her right paraspinal muscles. She has no other concerning symptoms such as bladder incontinence or pain traveling down her leg. Prior treatments with oral prednisone have helped significantly. We will prescribe that.  Resume PT. Consider dry needling.   Rash: The rash may be due to a drug reaction. She recently started taking lisinopril right when she began having the rash on her arms. She will stop taking the lisinopril and we will revisit a different bp medication in the future. The prednisone for her back will also help alleviate the rash. We will also give her Kenalog cream. She should return if the rash continues to spread or worsens.   Check MMR immunity and screen hep c.   Levothyroxine refilled at correct dose. Recheck in 6-12 weeks.   Orders Placed This Encounter  Procedures  . Measles/Mumps/Rubella Immunity  . Hepatitis C antibody   Meds ordered this encounter  Medications  . predniSONE (STERAPRED UNI-PAK 48 TAB) 5 MG (48) TBPK tablet    Sig: 12 day dosepack po    Dispense:  48  tablet    Refill:  0  . triamcinolone cream (KENALOG) 0.1 %    Sig: Apply 1 application topically 2 (two) times daily.    Dispense:  453.6 g    Refill:  0  . levothyroxine (SYNTHROID, LEVOTHROID) 137 MCG tablet    Sig: Take 1 tablet (137 mcg total) by mouth daily before breakfast.    Dispense:  90 tablet    Refill:  0     Discussed warning signs or symptoms. Please see discharge instructions. Patient expresses understanding.

## 2017-10-11 NOTE — Patient Instructions (Addendum)
Thank you for coming in today. STOP lisinopril.  Take prednsione.  Follow up with PT.  Decrease thyroid to 137.  Check those labs in 6-12 weeks.   Recheck with me as needed.     Drug Rash A drug rash is a change in the color or texture of the skin that is caused by a drug. It can develop minutes, hours, or days after the person takes the drug. What are the causes? This condition is usually caused by a drug allergy. It can also be caused by exposure to sunlight after taking a drug that makes the skin sensitive to light. Drugs that commonly cause rashes include:  Penicillin.  Antibiotic medicines.  Medicines that treat seizures.  Medicines that treat cancer (chemotherapy).  Aspirin and other nonsteroidal anti-inflammatory drugs (NSAIDs).  Injectable dyes that contain iodine.  Insulin.  What are the signs or symptoms? Symptoms of this condition include:  Redness.  Tiny bumps.  Peeling.  Itching.  Itchy welts (hives).  Swelling.  The rash may appear on a small area of skin or all over the body. How is this diagnosed? To diagnose the condition, your health care provider will do a physical exam. He or she may also order tests to find out which drug caused the rash. Tests to find the cause of a rash include:  Skin tests.  Blood tests.  Drug challenge. For this test, you stop taking all of the drugs that you do not need to take, and then you start taking them again by adding back one of the drugs at a time.  How is this treated? A drug rash may be treated with medicines, including:  Antihistamines. These may be given to relieve itching.  An NSAID. This may be given to reduce swelling and treat pain.  A steroid drug. This may be given to reduce swelling.  The rash usually goes away when the person stops taking the drug that caused it. Follow these instructions at home:  Take medicines only as directed by your health care provider.  Let all of your health  care providers know about any drug reactions you have had in the past.  If you have hives, take a cool shower or use a cool compress to relieve itchiness. Contact a health care provider if:  You have a fever.  Your rash is not going away.  Your rash gets worse.  Your rash comes back.  You have wheezing or coughing. Get help right away if:  You start to have breathing problems.  You start to have shortness of breath.  You face or throat starts to swell.  You have severe weakness with dizziness or fainting.  You have chest pain. This information is not intended to replace advice given to you by your health care provider. Make sure you discuss any questions you have with your health care provider. Document Released: 06/24/2004 Document Revised: 10/23/2015 Document Reviewed: 03/13/2014 Elsevier Interactive Patient Education  [employer].

## 2017-10-17 ENCOUNTER — Encounter: Payer: Self-pay | Admitting: Physician Assistant

## 2017-10-18 DIAGNOSIS — R195 Other fecal abnormalities: Secondary | ICD-10-CM | POA: Diagnosis not present

## 2017-10-20 DIAGNOSIS — Z23 Encounter for immunization: Secondary | ICD-10-CM | POA: Diagnosis not present

## 2017-10-21 LAB — MEASLES/MUMPS/RUBELLA IMMUNITY
Mumps IgG: <9 AU/mL — ABNORMAL LOW
RUBELLA: 23.3 {index}
Rubeola IgG: >300 AU/mL

## 2017-10-26 ENCOUNTER — Ambulatory Visit (INDEPENDENT_AMBULATORY_CARE_PROVIDER_SITE_OTHER): Payer: Medicare Other | Admitting: Family Medicine

## 2017-10-26 VITALS — Temp 98.3°F

## 2017-10-26 DIAGNOSIS — Z23 Encounter for immunization: Secondary | ICD-10-CM | POA: Diagnosis not present

## 2017-10-26 NOTE — Progress Notes (Signed)
   Subjective:    Patient ID: [name], female    DOB: [birth year], 67 y.o.   MRN: [id number]  HPI  [name] is here for a MMR vaccine. Denies problems with vaccines in the past.   Review of Systems     Objective:   Physical Exam        Assessment & Plan:  Need MMR vaccine - Patient tolerated injection well without complications.

## 2017-10-30 ENCOUNTER — Other Ambulatory Visit: Payer: Self-pay | Admitting: Physician Assistant

## 2017-10-30 DIAGNOSIS — M5136 Other intervertebral disc degeneration, lumbar region: Secondary | ICD-10-CM

## 2017-11-01 DIAGNOSIS — M0579 Rheumatoid arthritis with rheumatoid factor of multiple sites without organ or systems involvement: Secondary | ICD-10-CM | POA: Diagnosis not present

## 2017-11-01 DIAGNOSIS — M19041 Primary osteoarthritis, right hand: Secondary | ICD-10-CM | POA: Diagnosis not present

## 2017-11-01 DIAGNOSIS — M19042 Primary osteoarthritis, left hand: Secondary | ICD-10-CM | POA: Diagnosis not present

## 2017-11-01 DIAGNOSIS — Z79899 Other long term (current) drug therapy: Secondary | ICD-10-CM | POA: Diagnosis not present

## 2017-11-06 ENCOUNTER — Other Ambulatory Visit: Payer: Self-pay | Admitting: Physician Assistant

## 2017-11-06 DIAGNOSIS — F331 Major depressive disorder, recurrent, moderate: Secondary | ICD-10-CM

## 2017-11-14 ENCOUNTER — Telehealth: Payer: Self-pay | Admitting: Physician Assistant

## 2017-11-14 NOTE — Telephone Encounter (Signed)
Pt calls and states that she is due for her mammogram and CT lung scan and would like orders for those sent in. She also states that she needs to get 2nd shingles vaccine and states you wanted her to do a pad? Test. Please advise

## 2017-11-15 ENCOUNTER — Ambulatory Visit: Payer: Medicare Other | Admitting: Physician Assistant

## 2017-11-15 ENCOUNTER — Other Ambulatory Visit: Payer: Self-pay | Admitting: Physician Assistant

## 2017-11-15 DIAGNOSIS — Z1231 Encounter for screening mammogram for malignant neoplasm of breast: Secondary | ICD-10-CM

## 2017-11-15 DIAGNOSIS — F17200 Nicotine dependence, unspecified, uncomplicated: Secondary | ICD-10-CM

## 2017-11-15 DIAGNOSIS — J432 Centrilobular emphysema: Secondary | ICD-10-CM

## 2017-11-15 NOTE — Telephone Encounter (Signed)
Sent mammogram. Sent CT lung referral.  Ok for shingles.  Are you speaking of a pap test?

## 2017-11-16 NOTE — Telephone Encounter (Signed)
Called patient back and LM for her to return call. KG LPN

## 2017-11-16 NOTE — Telephone Encounter (Signed)
Not a pap. She needs referral for test to check her lower extremity arteries and vein functioning. Said you and her had discussed this at some point. KG LPN

## 2017-11-16 NOTE — Telephone Encounter (Signed)
They were ordered in feb of this year. Will reprint and see if cindy can schedule.

## 2017-11-17 ENCOUNTER — Other Ambulatory Visit: Payer: Self-pay | Admitting: Acute Care

## 2017-11-17 DIAGNOSIS — F17210 Nicotine dependence, cigarettes, uncomplicated: Secondary | ICD-10-CM

## 2017-11-17 DIAGNOSIS — Z122 Encounter for screening for malignant neoplasm of respiratory organs: Secondary | ICD-10-CM

## 2017-11-18 ENCOUNTER — Other Ambulatory Visit: Payer: Self-pay | Admitting: Physician Assistant

## 2017-11-18 DIAGNOSIS — F419 Anxiety disorder, unspecified: Secondary | ICD-10-CM

## 2017-11-21 ENCOUNTER — Ambulatory Visit (INDEPENDENT_AMBULATORY_CARE_PROVIDER_SITE_OTHER): Payer: Medicare Other | Admitting: Physician Assistant

## 2017-11-21 ENCOUNTER — Ambulatory Visit: Payer: Medicare Other

## 2017-11-21 ENCOUNTER — Encounter: Payer: Self-pay | Admitting: Physician Assistant

## 2017-11-21 VITALS — BP 127/51 | HR 102 | Temp 100.2°F | Wt 133.0 lb

## 2017-11-21 DIAGNOSIS — Z8719 Personal history of other diseases of the digestive system: Secondary | ICD-10-CM | POA: Diagnosis not present

## 2017-11-21 DIAGNOSIS — R1084 Generalized abdominal pain: Secondary | ICD-10-CM | POA: Diagnosis not present

## 2017-11-21 DIAGNOSIS — R198 Other specified symptoms and signs involving the digestive system and abdomen: Secondary | ICD-10-CM | POA: Diagnosis not present

## 2017-11-21 DIAGNOSIS — I2510 Atherosclerotic heart disease of native coronary artery without angina pectoris: Secondary | ICD-10-CM

## 2017-11-21 DIAGNOSIS — R110 Nausea: Secondary | ICD-10-CM | POA: Diagnosis not present

## 2017-11-21 DIAGNOSIS — R112 Nausea with vomiting, unspecified: Secondary | ICD-10-CM | POA: Diagnosis not present

## 2017-11-21 DIAGNOSIS — N12 Tubulo-interstitial nephritis, not specified as acute or chronic: Secondary | ICD-10-CM | POA: Diagnosis not present

## 2017-11-21 LAB — COMPLETE METABOLIC PANEL WITH GFR
AG RATIO: 1.2 (calc) (ref 1.0–2.5)
ALKALINE PHOSPHATASE (APISO): 120 U/L (ref 33–130)
ALT: 33 U/L — AB (ref 6–29)
AST: 20 U/L (ref 10–35)
Albumin: 3.7 g/dL (ref 3.6–5.1)
BILIRUBIN TOTAL: 0.7 mg/dL (ref 0.2–1.2)
BUN: 12 mg/dL (ref 7–25)
CHLORIDE: 97 mmol/L — AB (ref 98–110)
CO2: 25 mmol/L (ref 20–32)
Calcium: 8.9 mg/dL (ref 8.6–10.4)
Creat: 0.78 mg/dL (ref 0.50–0.99)
GFR, Est African American: 91 mL/min/{1.73_m2} (ref >=60)
GFR, Est Non African American: 78 mL/min/{1.73_m2} (ref >=60)
GLOBULIN: 3 g/dL (ref 1.9–3.7)
Glucose, Bld: 213 mg/dL — ABNORMAL HIGH (ref 65–99)
POTASSIUM: 4.2 mmol/L (ref 3.5–5.3)
SODIUM: 131 mmol/L — AB (ref 135–146)
Total Protein: 6.7 g/dL (ref 6.1–8.1)

## 2017-11-21 LAB — POCT URINALYSIS DIPSTICK
BILIRUBIN UA: NEGATIVE
GLUCOSE UA: NEGATIVE
KETONES UA: NEGATIVE
Nitrite, UA: POSITIVE
PH UA: 6 (ref 5.0–8.0)
Protein, UA: POSITIVE — AB
Spec Grav, UA: <=1.005 — AB (ref 1.010–1.025)
UROBILINOGEN UA: 1 U/dL

## 2017-11-21 LAB — CBC WITH DIFFERENTIAL/PLATELET
BASOS ABS: 31 {cells}/uL (ref 0–200)
Basophils Relative: 0.2 %
EOS PCT: 0 %
Eosinophils Absolute: 0 cells/uL — ABNORMAL LOW (ref 15–500)
HEMATOCRIT: 36 % (ref 35.0–45.0)
Hemoglobin: 12.2 g/dL (ref 11.7–15.5)
LYMPHS ABS: 1115 {cells}/uL (ref 850–3900)
MCH: 31.2 pg (ref 27.0–33.0)
MCHC: 33.9 g/dL (ref 32.0–36.0)
MCV: 92.1 fL (ref 80.0–100.0)
MPV: 11.5 fL (ref 7.5–12.5)
Monocytes Relative: 10.9 %
Neutro Abs: 12843 cells/uL — ABNORMAL HIGH (ref 1500–7800)
Neutrophils Relative %: 81.8 %
PLATELETS: 215 10*3/uL (ref 140–400)
RBC: 3.91 10*6/uL (ref 3.80–5.10)
RDW: 12.2 % (ref 11.0–15.0)
TOTAL LYMPHOCYTE: 7.1 %
WBC: 15.7 10*3/uL — AB (ref 3.8–10.8)
WBCMIX: 1711 {cells}/uL — AB (ref 200–950)

## 2017-11-21 LAB — LIPASE: LIPASE: 19 U/L (ref 7–60)

## 2017-11-21 MED ORDER — METRONIDAZOLE 500 MG PO TABS: 500.0000 mg | ORAL_TABLET | Freq: Three times a day (TID) | ORAL | 0 refills | Status: DC

## 2017-11-21 MED ORDER — CIPROFLOXACIN HCL 500 MG PO TABS: 500.0000 mg | ORAL_TABLET | Freq: Two times a day (BID) | ORAL | 0 refills | Status: DC

## 2017-11-21 MED ORDER — PROMETHAZINE HCL 25 MG PO TABS: 25.0000 mg | ORAL_TABLET | Freq: Four times a day (QID) | ORAL | 0 refills | Status: DC | PRN

## 2017-11-21 NOTE — Progress Notes (Signed)
Subjective:    Patient ID: Marvetta Gibbons, female    DOB: 1951/02/04, 67 y.o.   MRN: [id number]  HPI  Pt is a 67 yo female with CAD, COPD, HTN, hx of MI who presents to the clinic with 1 week of nausea and vomiting with low grade fever. She has had loose stools off and on. She feels "terrible". She reports not eating anything for at least 3 days. She is drinking water. She hurts all over in her abdomen and back. She denies any dysuria but she is going more frequently. She has had diverticulitis in the past. She denies any black tarry stools or bright red blood in stools. She has colonoscopy scheduled for 16th of July. She denies any cough or SOB.   [name]. Active Ambulatory Problems    Diagnosis Date Noted  . DM (diabetes mellitus) ([facility]) 06/05/2007  . Mixed hyperlipidemia 09/28/2007  . HYPERCALCEMIA 11/13/2009  . MDD (major depressive disorder), recurrent episode, mild ([facility]) 09/28/2007  . FATTY LIVER DISEASE 09/28/2007  . ARTHRITIS, ACROMIOCLAVICULAR 07/31/2008  . SPINAL STENOSIS IN CERVICAL REGION 07/31/2008  . OSTEOPOROSIS 10/17/2007  . Elevated liver function tests 09/28/2007  . Allergic rhinitis 10/06/2010  . Chronic headache 04/03/2011  . Tobacco use 04/03/2011  . Chronic pain 04/03/2011  . Benign hypertension 04/03/2011  . Sacroiliitis ([facility]CC) 09/14/2011  . Arthritis of carpometacarpal joint 02/14/2012  . Diabetes type 2, controlled ([facility]) 03/14/2013  . Hypothyroidism 03/14/2013  . Trochanteric bursitis of right hip 05/04/2013  . Anxiety state 05/16/2013  . Ganglion cyst of wrist 07/13/2013  . Right shoulder pain 09/23/2013  . COLD (chronic obstructive lung disease) ([facility]) 12/06/2013  . Primary osteoarthritis of left wrist 12/10/2014  . Hyperlipidemia 01/22/2015  . 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 Myocardial infarct ([facility]) 03/09/2017  . CAD (coronary artery disease) 03/09/2017  . Rheumatoid arthritis involving both hands ([facility])  03/09/2017  . Rotator cuff tendonitis, left 03/10/2017  . Anxiety 03/12/2017  . Left upper arm swelling secondary to rotator cuff tear and fluid tracking down biceps tendon sheath 04/14/2017  . Common bile duct dilatation 04/27/2017  . Lumbar degenerative disc disease 05/13/2017  . Abnormal finding on GI tract imaging 05/25/2017  . Chronic back pain 02/27/2016  . Atherosclerosis of native coronary artery of native heart without angina pectoris 03/16/2016  . Bulging lumbar disc 02/27/2016  . RA (rheumatoid arthritis) ([facility]) 07/06/2016  . Acquired hypothyroidism 12/01/2015  . Moderate episode of recurrent major depressive disorder ([facility]) 07/24/2017  . Bilateral calf pain 07/24/2017  . Memory changes 07/24/2017  . Bilateral cataracts 09/07/2017  . Dry eye syndrome of bilateral lacrimal glands 09/07/2017  . Positive colorectal cancer screening using Cologuard test 10/07/2017  . Microalbuminuria 10/07/2017  . Weakness of both lower extremities 10/07/2017  . History of diverticulitis 11/21/2017  . Non-intractable vomiting with nausea 11/21/2017  . Pyelonephritis 11/21/2017  . Left lower quadrant guarding 11/21/2017  . Generalized abdominal pain 11/21/2017   Resolved Ambulatory Problems    Diagnosis Date Noted  . Chronic pain syndrome 06/05/2007  . HEMORRHOIDS, WITH BLEEDING 04/22/2008  . Acute maxillary sinusitis 04/10/2010  . Alcoholic fatty liver 12/24/2009  . RENAL INSUFFICIENCY, ACUTE 10/23/2008  . ROTATOR CUFF SYNDROME, RIGHT 04/01/2008  . ALLERGIC REACTION 12/24/2009  . ELEVATED BLOOD PRESSURE WITHOUT DIAGNOSIS OF HYPERTENSION 05/13/2010  . CHRONIC OBSTRUCTIVE PULMONARY DISEASE, ACUTE EXACERBATION 08/14/2010  . Recurrent acute sinusitis 09/09/2010  . Allergic dermatitis due to poison ivy 11/27/2010  .  DERMATITIS, ATOPIC 01/15/2011  . Ganglion cyst of left foot 07/13/2013  . Chronic pain syndrome 12/05/2013   Past Medical History:  Diagnosis Date  . CAD (coronary artery  disease)   . Cervicalgia   . Chronic pain   . COPD (chronic obstructive pulmonary disease) ([facility])   . DDD (degenerative disc disease), lumbar   . Diabetes mellitus   . Fatty liver   . Hyperlipidemia   . Narcotic abuse ([facility])   . Osteoarthritis   . Osteoporosis   . Rheumatoid arthritis ([facility])   . Thyroid disease        Review of Systems See HPI.     Objective:   Physical Exam  Constitutional: She appears well-developed and well-nourished.  HENT:  Head: Normocephalic and atraumatic.  Cardiovascular: Regular rhythm and normal heart sounds.  Tachycardia.   Pulmonary/Chest: Effort normal and breath sounds normal. She has no wheezes.  Abdominal: She exhibits distension. She exhibits no mass. There is tenderness. There is guarding.  Decreased bowel sounds in all quadrants.  Bilateral lower abdominal tenderness with guarding present.  Bilateral CVA tenderness.   Neurological: She is alert.  Skin: Skin is warm. She is diaphoretic.  Pale face.   Psychiatric: She has a normal mood and affect.          Assessment & Plan:  Marland KitchenMarland KitchenDiane was seen today for headache.  Diagnoses and all orders for this visit:  Pyelonephritis -     ciprofloxacin (CIPRO) 500 MG tablet; Take 1 tablet (500 mg total) by mouth 2 (two) times daily. For 10 days.  Generalized abdominal pain -     CBC with Differential/Platelet -     COMPLETE METABOLIC PANEL WITH GFR -     Lipase -     ciprofloxacin (CIPRO) 500 MG tablet; Take 1 tablet (500 mg total) by mouth 2 (two) times daily. For 10 days. -     metroNIDAZOLE (FLAGYL) 500 MG tablet; Take 1 tablet (500 mg total) by mouth 3 (three) times daily. For 10 days. -     Cancel: Urine Culture -     Urine Culture  Nausea -     CBC with Differential/Platelet -     COMPLETE METABOLIC PANEL WITH GFR -     Lipase -     promethazine (PHENERGAN) 25 MG tablet; Take 1 tablet (25 mg total) by mouth every 6 (six) hours as needed for nausea or vomiting.  Non-intractable  vomiting with nausea, unspecified vomiting type -     CBC with Differential/Platelet -     COMPLETE METABOLIC PANEL WITH GFR -     Lipase  History of diverticulitis -     ciprofloxacin (CIPRO) 500 MG tablet; Take 1 tablet (500 mg total) by mouth 2 (two) times daily. For 10 days. -     metroNIDAZOLE (FLAGYL) 500 MG tablet; Take 1 tablet (500 mg total) by mouth 3 (three) times daily. For 10 days.  Left lower quadrant guarding -     ciprofloxacin (CIPRO) 500 MG tablet; Take 1 tablet (500 mg total) by mouth 2 (two) times daily. For 10 days. -     metroNIDAZOLE (FLAGYL) 500 MG tablet; Take 1 tablet (500 mg total) by mouth 3 (three) times daily. For 10 days. -     POCT urinalysis dipstick -     Urine Culture     .[name] Results for orders placed or performed in visit on 11/21/17  CBC with Differential/Platelet  Result Value Ref Range  WBC 15.7 (H) 3.8 - 10.8 Thousand/uL   RBC 3.91 3.80 - 5.10 Million/uL   Hemoglobin 12.2 11.7 - 15.5 g/dL   HCT 91.6 38.4 - 66.5 %   MCV 92.1 80.0 - 100.0 fL   MCH 31.2 27.0 - 33.0 pg   MCHC 33.9 32.0 - 36.0 g/dL   RDW 99.3 57.0 - 17.7 %   Platelets 215 140 - 400 Thousand/uL   MPV 11.5 7.5 - 12.5 fL   Neutro Abs 12,843 (H) 1,500 - 7,800 cells/uL   Lymphs Abs 1,115 850 - 3,900 cells/uL   WBC mixed population 1,711 (H) 200 - 950 cells/uL   Eosinophils Absolute 0 (L) 15 - 500 cells/uL   Basophils Absolute 31 0 - 200 cells/uL   Neutrophils Relative % 81.8 %   Total Lymphocyte 7.1 %   Monocytes Relative 10.9 %   Eosinophils Relative 0.0 %   Basophils Relative 0.2 %  COMPLETE METABOLIC PANEL WITH GFR  Result Value Ref Range   Glucose, Bld 213 (H) 65 - 99 mg/dL   BUN 12 7 - 25 mg/dL   Creat 9.39 0.30 - 0.92 mg/dL   GFR, Est Non African American 78 > OR = 60 mL/min/1.39m2   GFR, Est African American 91 > OR = 60 mL/min/1.29m2   BUN/Creatinine Ratio NOT APPLICABLE 6 - 22 (calc)   Sodium 131 (L) 135 - 146 mmol/L   Potassium 4.2 3.5 - 5.3 mmol/L   Chloride  97 (L) 98 - 110 mmol/L   CO2 25 20 - 32 mmol/L   Calcium 8.9 8.6 - 10.4 mg/dL   Total Protein 6.7 6.1 - 8.1 g/dL   Albumin 3.7 3.6 - 5.1 g/dL   Globulin 3.0 1.9 - 3.7 g/dL (calc)   AG Ratio 1.2 1.0 - 2.5 (calc)   Total Bilirubin 0.7 0.2 - 1.2 mg/dL   Alkaline phosphatase (APISO) 120 33 - 130 U/L   AST 20 10 - 35 U/L   ALT 33 (H) 6 - 29 U/L  Lipase  Result Value Ref Range   Lipase 19 7 - 60 U/L  POCT urinalysis dipstick  Result Value Ref Range   Color, UA yellow    Clarity, UA clear    Glucose, UA Negative Negative   Bilirubin, UA neg    Ketones, UA neg    Spec Grav, UA <=1.005 (A) 1.010 - 1.025   Blood, UA small    pH, UA 6.0 5.0 - 8.0   Protein, UA Positive (A) Negative   Urobilinogen, UA 1.0 0.2 or 1.0 E.U./dL   Nitrite, UA positive    Leukocytes, UA Large (3+) (A) Negative   Appearance yellow    Odor none    Will culture urine. Urine was not obtained until patient was walking out. Dipstick is positive for infection in combination with elevated WBC, CVA tenderness, nausea it seems likely her symptoms are due to pyelonephritis however with abdominal guarding and hx of diverticulitis and elevated WBC will treat for diverticulitis empirically as well. Metronidazole and cipro were given to patient. Phenergan for nausea. Discussed CT scan but she declined today. Will follow closely.   She was given 1L of NS fluids today. 8mg  zofran ODT given in office for nausea. She tolerated well and did feel some better after leaving clinic.   [name].Spent 40 minutes with patient and greater than 50 percent of visit spent counseling patient regarding treatment plan.

## 2017-11-21 NOTE — Progress Notes (Signed)
Call patient and let her know that urine showed UTI as well. As sick as she was in office I think she could have a kidney infection as well.

## 2017-11-23 ENCOUNTER — Encounter: Payer: Self-pay | Admitting: Physician Assistant

## 2017-11-23 ENCOUNTER — Ambulatory Visit (INDEPENDENT_AMBULATORY_CARE_PROVIDER_SITE_OTHER): Payer: Medicare Other | Admitting: Physician Assistant

## 2017-11-23 VITALS — BP 120/70 | HR 84 | Temp 98.4°F | Wt 132.0 lb

## 2017-11-23 DIAGNOSIS — N12 Tubulo-interstitial nephritis, not specified as acute or chronic: Secondary | ICD-10-CM | POA: Diagnosis not present

## 2017-11-23 DIAGNOSIS — I2510 Atherosclerotic heart disease of native coronary artery without angina pectoris: Secondary | ICD-10-CM

## 2017-11-23 MED ORDER — CEFTRIAXONE SODIUM 500 MG IJ SOLR
1.0000 g | Freq: Once | INTRAMUSCULAR | Status: AC
  Administered 2017-11-23: 1 g via INTRAMUSCULAR

## 2017-11-23 NOTE — Patient Instructions (Signed)
Pyelonephritis, Adult Pyelonephritis is a kidney infection. The kidneys are organs that help clean your blood by moving waste out of your blood and into your pee (urine). This infection can happen quickly, or it can last for a long time. In most cases, it clears up with treatment and does not cause other problems. Follow these instructions at home: Medicines  Take over-the-counter and prescription medicines only as told by your doctor.  Take your antibiotic medicine as told by your doctor. Do not stop taking the medicine even if you start to feel better. General instructions  Drink enough fluid to keep your pee clear or pale yellow.  Avoid caffeine, tea, and carbonated drinks.  Pee (urinate) often. Avoid holding in pee for long periods of time.  Pee before and after sex.  After pooping (having a bowel movement), women should wipe from front to back. Use each tissue only once.  Keep all follow-up visits as told by your doctor. This is important. Contact a doctor if:  You do not feel better after 2 days.  Your symptoms get worse.  You have a fever. Get help right away if:  You cannot take your medicine or drink fluids as told.  You have chills and shaking.  You throw up (vomit).  You have very bad pain in your side (flank) or back.  You feel very weak or you pass out (faint). This information is not intended to replace advice given to you by your health care provider. Make sure you discuss any questions you have with your health care provider. Document Released: 06/24/2004 Document Revised: 10/23/2015 Document Reviewed: 09/09/2014 Elsevier Interactive Patient Education  2018 [employer]  

## 2017-11-23 NOTE — Progress Notes (Signed)
Confirmed e.coli in urine.

## 2017-11-23 NOTE — Progress Notes (Signed)
Subjective:    Patient ID: [name], female    DOB: 1950/12/09, 67 y.o.   MRN: [id number]  HPI Patient is a 67 year old female who presents to the clinic to follow-up after office visit on 11/21/2017 for pyelonephritis.  She was given Cipro for 10 days.  Urine culture confirmed E. coli as the source of infection.  Patient was given IV fluids in office and sent home with Phenergan.  She has used Phenergan.  She has not vomited and has had a little diarrhea.  Overall she is feeling about 10% better.  She comes here to get rechecked.  No fever, chills.  She still has bilateral flank pain. WBC was 15000+.   .. Active Ambulatory Problems    Diagnosis Date Noted  . DM (diabetes mellitus) (HCC) 06/05/2007  . Mixed hyperlipidemia 09/28/2007  . HYPERCALCEMIA 11/13/2009  . MDD (major depressive disorder), recurrent episode, mild (HCC) 09/28/2007  . FATTY LIVER DISEASE 09/28/2007  . ARTHRITIS, ACROMIOCLAVICULAR 07/31/2008  . SPINAL STENOSIS IN CERVICAL REGION 07/31/2008  . OSTEOPOROSIS 10/17/2007  . Elevated liver function tests 09/28/2007  . Allergic rhinitis 10/06/2010  . Chronic headache 04/03/2011  . Tobacco use 04/03/2011  . Chronic pain 04/03/2011  . Benign hypertension 04/03/2011  . Sacroiliitis (HCC) 09/14/2011  . Arthritis of carpometacarpal joint 02/14/2012  . Diabetes type 2, controlled (HCC) 03/14/2013  . Hypothyroidism 03/14/2013  . Trochanteric bursitis of right hip 05/04/2013  . Anxiety state 05/16/2013  . Ganglion cyst of wrist 07/13/2013  . Right shoulder pain 09/23/2013  . COLD (chronic obstructive lung disease) (HCC) 12/06/2013  . Primary osteoarthritis of left wrist 12/10/2014  . Hyperlipidemia 01/22/2015  . 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 Myocardial infarct (HCC) 03/09/2017  . CAD (coronary artery disease) 03/09/2017  . Rheumatoid arthritis involving both hands (HCC) 03/09/2017  . Rotator cuff tendonitis,  left 03/10/2017  . Anxiety 03/12/2017  . Left upper arm swelling secondary to rotator cuff tear and fluid tracking down biceps tendon sheath 04/14/2017  . Common bile duct dilatation 04/27/2017  . Lumbar degenerative disc disease 05/13/2017  . Abnormal finding on GI tract imaging 05/25/2017  . Chronic back pain 02/27/2016  . Atherosclerosis of native coronary artery of native heart without angina pectoris 03/16/2016  . Bulging lumbar disc 02/27/2016  . RA (rheumatoid arthritis) (HCC) 07/06/2016  . Acquired hypothyroidism 12/01/2015  . Moderate episode of recurrent major depressive disorder (HCC) 07/24/2017  . Bilateral calf pain 07/24/2017  . Memory changes 07/24/2017  . Bilateral cataracts 09/07/2017  . Dry eye syndrome of bilateral lacrimal glands 09/07/2017  . Positive colorectal cancer screening using Cologuard test 10/07/2017  . Microalbuminuria 10/07/2017  . Weakness of both lower extremities 10/07/2017  . History of diverticulitis 11/21/2017  . Non-intractable vomiting with nausea 11/21/2017  . Pyelonephritis 11/21/2017  . Left lower quadrant guarding 11/21/2017  . Generalized abdominal pain 11/21/2017   Resolved Ambulatory Problems    Diagnosis Date Noted  . Chronic pain syndrome 06/05/2007  . HEMORRHOIDS, WITH BLEEDING 04/22/2008  . Acute maxillary sinusitis 04/10/2010  . Alcoholic fatty liver 12/24/2009  . RENAL INSUFFICIENCY, ACUTE 10/23/2008  . ROTATOR CUFF SYNDROME, RIGHT 04/01/2008  . ALLERGIC REACTION 12/24/2009  . ELEVATED BLOOD PRESSURE WITHOUT DIAGNOSIS OF HYPERTENSION 05/13/2010  . CHRONIC OBSTRUCTIVE PULMONARY DISEASE, ACUTE EXACERBATION 08/14/2010  . Recurrent acute sinusitis 09/09/2010  . Allergic dermatitis due to poison ivy 11/27/2010  . DERMATITIS, ATOPIC 01/15/2011  . Ganglion cyst of left foot 07/13/2013  .  Chronic pain syndrome 12/05/2013   Past Medical History:  Diagnosis Date  . CAD (coronary artery disease)   . Cervicalgia   . Chronic pain    . COPD (chronic obstructive pulmonary disease) (HCC)   . DDD (degenerative disc disease), lumbar   . Diabetes mellitus   . Fatty liver   . Hyperlipidemia   . Narcotic abuse (HCC)   . Osteoarthritis   . Osteoporosis   . Rheumatoid arthritis (HCC)   . Thyroid disease       Review of Systems  All other systems reviewed and are negative.      Objective:   Physical Exam  Constitutional: She is oriented to person, place, and time. She appears well-developed and well-nourished.  HENT:  Head: Normocephalic and atraumatic.  Cardiovascular: Normal rate and regular rhythm.  Pulmonary/Chest: Effort normal and breath sounds normal.  Bilateral CVA tenderness.   Abdominal: Soft. Bowel sounds are normal.  diffusely tender.   Neurological: She is alert and oriented to person, place, and time.  Psychiatric: She has a normal mood and affect. Her behavior is normal.          Assessment & Plan:  Marland KitchenMarland KitchenDiane was seen today for back pain.  Diagnoses and all orders for this visit:  Pyelonephritis -     cefTRIAXone (ROCEPHIN) injection 1 g -     CBC   Pt is doing a little better. Will give rocephin 1g IM injection. Recheck CBC today. Continue to keep hydrated. Discussed signs of sepsis. If not improving call office or go to ER. Continue phenergan for nausea.

## 2017-11-24 ENCOUNTER — Telehealth: Payer: Self-pay | Admitting: Physician Assistant

## 2017-11-24 LAB — URINE CULTURE
MICRO NUMBER:: 90752687
SPECIMEN QUALITY: ADEQUATE

## 2017-11-24 LAB — CBC
HEMATOCRIT: 36.1 % (ref 35.0–45.0)
HEMOGLOBIN: 12.5 g/dL (ref 11.7–15.5)
MCH: 31.2 pg (ref 27.0–33.0)
MCHC: 34.6 g/dL (ref 32.0–36.0)
MCV: 90 fL (ref 80.0–100.0)
MPV: 11.7 fL (ref 7.5–12.5)
Platelets: 243 10*3/uL (ref 140–400)
RBC: 4.01 10*6/uL (ref 3.80–5.10)
RDW: 12.2 % (ref 11.0–15.0)
WBC: 12.9 10*3/uL — ABNORMAL HIGH (ref 3.8–10.8)

## 2017-11-24 NOTE — Progress Notes (Signed)
White count is coming down very reassuring.

## 2017-11-24 NOTE — Telephone Encounter (Signed)
Agree with documentation as above.   Catherine Metheney, MD  

## 2017-11-24 NOTE — Telephone Encounter (Signed)
Patient calls and states that she is seeing blood in her stool, she is having diarrhea today. Spoke with Dr. Linford Arnold and per her orders advised Asencion to go to the ED to be seen. Pt agrees and will proceed to the ED. KG LPN

## 2017-11-27 DIAGNOSIS — Z885 Allergy status to narcotic agent status: Secondary | ICD-10-CM | POA: Diagnosis not present

## 2017-11-27 DIAGNOSIS — R918 Other nonspecific abnormal finding of lung field: Secondary | ICD-10-CM | POA: Diagnosis not present

## 2017-11-27 DIAGNOSIS — Z87440 Personal history of urinary (tract) infections: Secondary | ICD-10-CM | POA: Diagnosis not present

## 2017-11-27 DIAGNOSIS — K921 Melena: Secondary | ICD-10-CM | POA: Diagnosis not present

## 2017-11-27 DIAGNOSIS — R197 Diarrhea, unspecified: Secondary | ICD-10-CM | POA: Diagnosis not present

## 2017-11-27 DIAGNOSIS — R1084 Generalized abdominal pain: Secondary | ICD-10-CM | POA: Diagnosis not present

## 2017-11-27 DIAGNOSIS — M4696 Unspecified inflammatory spondylopathy, lumbar region: Secondary | ICD-10-CM | POA: Diagnosis not present

## 2017-11-27 DIAGNOSIS — R112 Nausea with vomiting, unspecified: Secondary | ICD-10-CM | POA: Diagnosis not present

## 2017-11-27 DIAGNOSIS — E860 Dehydration: Secondary | ICD-10-CM | POA: Diagnosis not present

## 2017-11-27 DIAGNOSIS — R300 Dysuria: Secondary | ICD-10-CM | POA: Diagnosis not present

## 2017-11-27 DIAGNOSIS — R109 Unspecified abdominal pain: Secondary | ICD-10-CM | POA: Diagnosis not present

## 2017-12-02 ENCOUNTER — Telehealth: Payer: Self-pay | Admitting: Physician Assistant

## 2017-12-02 ENCOUNTER — Ambulatory Visit: Payer: Medicare Other | Admitting: Physician Assistant

## 2017-12-02 NOTE — Telephone Encounter (Signed)
Patient called at 2:05 to say she wouldn't make her appt. Her car would not start.[name]

## 2017-12-02 NOTE — Telephone Encounter (Signed)
Ok to take off schedule 

## 2017-12-07 ENCOUNTER — Other Ambulatory Visit: Payer: Self-pay | Admitting: *Deleted

## 2017-12-07 DIAGNOSIS — I213 ST elevation (STEMI) myocardial infarction of unspecified site: Secondary | ICD-10-CM

## 2017-12-07 DIAGNOSIS — I2510 Atherosclerotic heart disease of native coronary artery without angina pectoris: Secondary | ICD-10-CM

## 2017-12-07 MED ORDER — METOPROLOL TARTRATE 25 MG PO TABS: 12.5000 mg | ORAL_TABLET | Freq: Two times a day (BID) | ORAL | 2 refills | Status: DC

## 2017-12-08 ENCOUNTER — Ambulatory Visit: Payer: Medicare Other

## 2017-12-08 DIAGNOSIS — Z122 Encounter for screening for malignant neoplasm of respiratory organs: Secondary | ICD-10-CM

## 2017-12-08 DIAGNOSIS — F17210 Nicotine dependence, cigarettes, uncomplicated: Secondary | ICD-10-CM

## 2017-12-13 ENCOUNTER — Encounter: Payer: Self-pay | Admitting: Physician Assistant

## 2017-12-13 ENCOUNTER — Ambulatory Visit (INDEPENDENT_AMBULATORY_CARE_PROVIDER_SITE_OTHER): Payer: Medicare Other | Admitting: Physician Assistant

## 2017-12-13 ENCOUNTER — Ambulatory Visit (INDEPENDENT_AMBULATORY_CARE_PROVIDER_SITE_OTHER): Payer: Medicare Other

## 2017-12-13 VITALS — BP 163/80 | HR 99 | Wt 132.0 lb

## 2017-12-13 DIAGNOSIS — R55 Syncope and collapse: Secondary | ICD-10-CM

## 2017-12-13 DIAGNOSIS — S4992XA Unspecified injury of left shoulder and upper arm, initial encounter: Secondary | ICD-10-CM | POA: Diagnosis not present

## 2017-12-13 DIAGNOSIS — M25512 Pain in left shoulder: Secondary | ICD-10-CM

## 2017-12-13 DIAGNOSIS — W19XXXA Unspecified fall, initial encounter: Secondary | ICD-10-CM

## 2017-12-13 DIAGNOSIS — R739 Hyperglycemia, unspecified: Secondary | ICD-10-CM | POA: Diagnosis not present

## 2017-12-13 DIAGNOSIS — I2510 Atherosclerotic heart disease of native coronary artery without angina pectoris: Secondary | ICD-10-CM

## 2017-12-13 LAB — GLUCOSE, POCT (MANUAL RESULT ENTRY): POC GLUCOSE: 92 mg/dL (ref 70–99)

## 2017-12-13 MED ORDER — MELOXICAM 15 MG PO TABS: 15.0000 mg | ORAL_TABLET | Freq: Every day | ORAL | 1 refills | Status: DC

## 2017-12-13 MED ORDER — TIZANIDINE HCL 4 MG PO TABS: 4.0000 mg | ORAL_TABLET | Freq: Three times a day (TID) | ORAL | 0 refills | Status: AC

## 2017-12-13 MED ORDER — KETOROLAC TROMETHAMINE 60 MG/2ML IM SOLN
60.0000 mg | Freq: Once | INTRAMUSCULAR | Status: AC
  Administered 2017-12-13: 60 mg via INTRAMUSCULAR

## 2017-12-13 MED ORDER — PREDNISONE 50 MG PO TABS: ORAL_TABLET | ORAL | 0 refills | Status: DC

## 2017-12-13 NOTE — Patient Instructions (Signed)

## 2017-12-13 NOTE — Progress Notes (Signed)
Subjective:    Patient ID: [name], female    DOB: 1951-02-14, 67 y.o.   MRN: [id number]  HPI  Pt is a 67 yo female who presents to the clinic after passing out and falling on left shoulder. She recently had pyelonephritis CT done 11/27/17 no GI pathology found. Her GI symptoms have resolved but she continued to be weak when she fell out of shower on 7/12. She does not remember anything about that day. Her husband found her. She denies any bowel or bladder dysfunction.   .. Active Ambulatory Problems    Diagnosis Date Noted  . DM (diabetes mellitus) (HCC) 06/05/2007  . Mixed hyperlipidemia 09/28/2007  . HYPERCALCEMIA 11/13/2009  . MDD (major depressive disorder), recurrent episode, mild (HCC) 09/28/2007  . FATTY LIVER DISEASE 09/28/2007  . ARTHRITIS, ACROMIOCLAVICULAR 07/31/2008  . SPINAL STENOSIS IN CERVICAL REGION 07/31/2008  . OSTEOPOROSIS 10/17/2007  . Elevated liver function tests 09/28/2007  . Allergic rhinitis 10/06/2010  . Chronic headache 04/03/2011  . Tobacco use 04/03/2011  . Chronic pain 04/03/2011  . Benign hypertension 04/03/2011  . Sacroiliitis (HCC) 09/14/2011  . Arthritis of carpometacarpal joint 02/14/2012  . Diabetes type 2, controlled (HCC) 03/14/2013  . Hypothyroidism 03/14/2013  . Trochanteric bursitis of right hip 05/04/2013  . Anxiety state 05/16/2013  . Ganglion cyst of wrist 07/13/2013  . Right shoulder pain 09/23/2013  . COLD (chronic obstructive lung disease) (HCC) 12/06/2013  . Primary osteoarthritis of left wrist 12/10/2014  . Hyperlipidemia 01/22/2015  . 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 Myocardial infarct (HCC) 03/09/2017  . CAD (coronary artery disease) 03/09/2017  . Rheumatoid arthritis involving both hands (HCC) 03/09/2017  . Rotator cuff tendonitis, left 03/10/2017  . Anxiety 03/12/2017  . Left upper arm swelling secondary to rotator cuff tear and fluid tracking down biceps  tendon sheath 04/14/2017  . Common bile duct dilatation 04/27/2017  . Lumbar degenerative disc disease 05/13/2017  . Abnormal finding on GI tract imaging 05/25/2017  . Chronic back pain 02/27/2016  . Atherosclerosis of native coronary artery of native heart without angina pectoris 03/16/2016  . Bulging lumbar disc 02/27/2016  . RA (rheumatoid arthritis) (HCC) 07/06/2016  . Acquired hypothyroidism 12/01/2015  . Moderate episode of recurrent major depressive disorder (HCC) 07/24/2017  . Bilateral calf pain 07/24/2017  . Memory changes 07/24/2017  . Bilateral cataracts 09/07/2017  . Dry eye syndrome of bilateral lacrimal glands 09/07/2017  . Positive colorectal cancer screening using Cologuard test 10/07/2017  . Microalbuminuria 10/07/2017  . Weakness of both lower extremities 10/07/2017  . History of diverticulitis 11/21/2017  . Non-intractable vomiting with nausea 11/21/2017  . Pyelonephritis 11/21/2017  . Left lower quadrant guarding 11/21/2017  . Generalized abdominal pain 11/21/2017   Resolved Ambulatory Problems    Diagnosis Date Noted  . Chronic pain syndrome 06/05/2007  . HEMORRHOIDS, WITH BLEEDING 04/22/2008  . Acute maxillary sinusitis 04/10/2010  . Alcoholic fatty liver 12/24/2009  . RENAL INSUFFICIENCY, ACUTE 10/23/2008  . ROTATOR CUFF SYNDROME, RIGHT 04/01/2008  . ALLERGIC REACTION 12/24/2009  . ELEVATED BLOOD PRESSURE WITHOUT DIAGNOSIS OF HYPERTENSION 05/13/2010  . CHRONIC OBSTRUCTIVE PULMONARY DISEASE, ACUTE EXACERBATION 08/14/2010  . Recurrent acute sinusitis 09/09/2010  . Allergic dermatitis due to poison ivy 11/27/2010  . DERMATITIS, ATOPIC 01/15/2011  . Ganglion cyst of left foot 07/13/2013  . Chronic pain syndrome 12/05/2013   Past Medical History:  Diagnosis Date  . CAD (coronary artery disease)   . Cervicalgia   .  Chronic pain   . COPD (chronic obstructive pulmonary disease) (HCC)   . DDD (degenerative disc disease), lumbar   . Diabetes mellitus   .  Fatty liver   . Hyperlipidemia   . Narcotic abuse (HCC)   . Osteoarthritis   . Osteoporosis   . Rheumatoid arthritis (HCC)   . Thyroid disease          Review of Systems See HPI.     Objective:   Physical Exam  Constitutional: She is oriented to person, place, and time. She appears well-developed and well-nourished.  Cardiovascular: Normal rate and regular rhythm.  Pulmonary/Chest: Effort normal and breath sounds normal.  Musculoskeletal:  No ROM of left shoulder actively.  Extreme pain with any ROM active as well.  Tenderness over left anterior shoulder.  Weak hand grip 0/5. No strength/resistence of left arm 0/5.  Neurological: She is alert and oriented to person, place, and time.  Psychiatric: She has a normal mood and affect. Her behavior is normal.          Assessment & Plan:  Marland KitchenMarland KitchenDiane was seen today for loss of consciousness.  Diagnoses and all orders for this visit:  Acute pain of left shoulder -     DG Shoulder Left -     meloxicam (MOBIC) 15 MG tablet; Take 1 tablet (15 mg total) by mouth daily. -     tiZANidine (ZANAFLEX) 4 MG tablet; Take 1 tablet (4 mg total) by mouth 3 (three) times daily for 10 days. -     ketorolac (TORADOL) injection 60 mg -     predniSONE (DELTASONE) 50 MG tablet; One tab PO daily for 5 days. -     MR Shoulder Left Wo Contrast  Fall, initial encounter -     DG Shoulder Left -     meloxicam (MOBIC) 15 MG tablet; Take 1 tablet (15 mg total) by mouth daily. -     tiZANidine (ZANAFLEX) 4 MG tablet; Take 1 tablet (4 mg total) by mouth 3 (three) times daily for 10 days. -     predniSONE (DELTASONE) 50 MG tablet; One tab PO daily for 5 days. -     MR Shoulder Left Wo Contrast  Injury of left shoulder, initial encounter -     DG Shoulder Left -     meloxicam (MOBIC) 15 MG tablet; Take 1 tablet (15 mg total) by mouth daily. -     tiZANidine (ZANAFLEX) 4 MG tablet; Take 1 tablet (4 mg total) by mouth 3 (three) times daily for 10  days. -     predniSONE (DELTASONE) 50 MG tablet; One tab PO daily for 5 days. -     MR Shoulder Left Wo Contrast  Elevated blood sugar -     POCT Glucose (CBG)   Xray showed no fracture.   She is not moving shoulder at all. Concern is frozen shoulder if MRI is normal as well but pain is causing her not to want to move. Will get MRI of shoulder due to weakness. Prednisone, NSAID and muscle relaxer given. Pt on suboxone not giving opioids at this time. Follow up with sports medicine.   Discussed syncope. She was still recovering from pyelonephritis and heat from shower likely caused some vasovagal syncope. If happens again further work up to be done. Stay hydrated. Discussed fall risk with muscle relaxer.   [name].Spent 30 minutes with patient and greater than 50 percent of visit spent counseling patient regarding treatment plan.

## 2017-12-13 NOTE — Progress Notes (Signed)
No acute bone fracture/injury to left shoulder. I am placing order for MRI. I will give her a burst of prednisone.

## 2017-12-15 ENCOUNTER — Encounter: Payer: Self-pay | Admitting: *Deleted

## 2017-12-15 ENCOUNTER — Telehealth: Payer: Self-pay | Admitting: *Deleted

## 2017-12-15 ENCOUNTER — Other Ambulatory Visit: Payer: Self-pay | Admitting: Acute Care

## 2017-12-15 DIAGNOSIS — F17210 Nicotine dependence, cigarettes, uncomplicated: Secondary | ICD-10-CM

## 2017-12-15 DIAGNOSIS — Z122 Encounter for screening for malignant neoplasm of respiratory organs: Secondary | ICD-10-CM

## 2017-12-16 DIAGNOSIS — R55 Syncope and collapse: Secondary | ICD-10-CM | POA: Insufficient documentation

## 2017-12-16 NOTE — Telephone Encounter (Signed)
Will route to [place] to follow up

## 2017-12-17 ENCOUNTER — Other Ambulatory Visit: Payer: Self-pay | Admitting: Physician Assistant

## 2017-12-19 ENCOUNTER — Other Ambulatory Visit: Payer: Self-pay | Admitting: Physician Assistant

## 2017-12-19 NOTE — Telephone Encounter (Signed)
Patient called requesting to speak to [facility] and was advised she would return her call when she returns to the office.

## 2017-12-20 NOTE — Telephone Encounter (Signed)
Pt informed of CT results per Sarah Groce, NP.  PT verbalized understanding.  Copy sent to PCP.  Order placed for 1 yr f/u CT.  

## 2017-12-26 ENCOUNTER — Other Ambulatory Visit: Payer: Medicare Other

## 2017-12-29 ENCOUNTER — Encounter: Payer: Medicare Other | Admitting: Physician Assistant

## 2017-12-30 ENCOUNTER — Emergency Department (INDEPENDENT_AMBULATORY_CARE_PROVIDER_SITE_OTHER)
Admission: EM | Admit: 2017-12-30 | Discharge: 2017-12-30 | Disposition: A | Discharge status: Home / Self Care | Payer: Medicare Other | Source: Home / Self Care | Attending: Family Medicine | Admitting: Family Medicine

## 2017-12-30 ENCOUNTER — Encounter: Payer: Self-pay | Admitting: Emergency Medicine

## 2017-12-30 ENCOUNTER — Other Ambulatory Visit: Payer: Self-pay

## 2017-12-30 DIAGNOSIS — G8929 Other chronic pain: Secondary | ICD-10-CM

## 2017-12-30 DIAGNOSIS — M069 Rheumatoid arthritis, unspecified: Secondary | ICD-10-CM | POA: Diagnosis not present

## 2017-12-30 DIAGNOSIS — M545 Low back pain, unspecified: Secondary | ICD-10-CM

## 2017-12-30 DIAGNOSIS — M79645 Pain in left finger(s): Secondary | ICD-10-CM

## 2017-12-30 MED ORDER — PREDNISONE 20 MG PO TABS: 20.0000 mg | ORAL_TABLET | Freq: Every day | ORAL | 0 refills | Status: DC

## 2017-12-30 MED ORDER — DEXAMETHASONE SODIUM PHOSPHATE 10 MG/ML IJ SOLN
10.0000 mg | Freq: Once | INTRAMUSCULAR | Status: AC
  Administered 2017-12-30: 10 mg via INTRAMUSCULAR

## 2017-12-30 NOTE — ED Provider Notes (Signed)
[name]    CSN: [id number] Arrival date & time: 12/30/17  1137     History   Chief Complaint Chief Complaint  Patient presents with  . Hand Pain  . Back Pain    HPI [name] is a 67 y.o. female.   HPI  [name] is a 67 y.o. female presenting to UC with hx of RA c/o RA flare in her Left hand/thumb and lower back that started 1 week ago. Back pain is worse on left side. Pain is aching and sore, worse with movement. Moderate to severe. She has taken mobic and a muscle relaxer without relief. She usually gets a steroid to help with the pain. No recent injuries. She does not take anything for her RA normally.    Past Medical History:  Diagnosis Date  . CAD (coronary artery disease)   . Cervicalgia   . Chronic pain   . COPD (chronic obstructive pulmonary disease) (HCC)   . DDD (degenerative disc disease), lumbar   . Diabetes mellitus   . Fatty liver   . Hyperlipidemia   . Narcotic abuse (HCC)    history  . Osteoarthritis   . Osteoporosis   . Rheumatoid arthritis (HCC)   . Thyroid disease    hypothyroid    Patient Active Problem List   Diagnosis Date Noted  . Vasovagal syncope 12/16/2017  . History of diverticulitis 11/21/2017  . Non-intractable vomiting with nausea 11/21/2017  . Pyelonephritis 11/21/2017  . Left lower quadrant guarding 11/21/2017  . Generalized abdominal pain 11/21/2017  . Positive colorectal cancer screening using Cologuard test 10/07/2017  . Microalbuminuria 10/07/2017  . Weakness of both lower extremities 10/07/2017  . Bilateral cataracts 09/07/2017  . Dry eye syndrome of bilateral lacrimal glands 09/07/2017  . Moderate episode of recurrent major depressive disorder (HCC) 07/24/2017  . Bilateral calf pain 07/24/2017  . Memory changes 07/24/2017  . Abnormal finding on GI tract imaging 05/25/2017  . Lumbar degenerative disc disease 05/13/2017  . Common bile duct dilatation 04/27/2017  . Left upper arm swelling  secondary to rotator cuff tear and fluid tracking down biceps tendon sheath 04/14/2017  . Anxiety 03/12/2017  . Rotator cuff tendonitis, left 03/10/2017  . Myocardial infarct (HCC) 03/09/2017  . CAD (coronary artery disease) 03/09/2017  . Rheumatoid arthritis involving both hands (HCC) 03/09/2017  . RA (rheumatoid arthritis) (HCC) 07/06/2016  . Atherosclerosis of native coronary artery of native heart without angina pectoris 03/16/2016  . Chronic back pain 02/27/2016  . Bulging lumbar disc 02/27/2016  . Acquired hypothyroidism 12/01/2015  . 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 Hyperlipidemia 01/22/2015  . Primary osteoarthritis of left wrist 12/10/2014  . COLD (chronic obstructive lung disease) (HCC) 12/06/2013  . Acute pain of left shoulder 09/23/2013  . Ganglion cyst of wrist 07/13/2013  . Anxiety state 05/16/2013  . Trochanteric bursitis of right hip 05/04/2013  . Diabetes type 2, controlled (HCC) 03/14/2013  . Hypothyroidism 03/14/2013  . Arthritis of carpometacarpal joint 02/14/2012  . Sacroiliitis (HCC) 09/14/2011  . Chronic headache 04/03/2011  . Tobacco use 04/03/2011  . Chronic pain 04/03/2011  . Benign hypertension 04/03/2011  . Allergic rhinitis 10/06/2010  . HYPERCALCEMIA 11/13/2009  . ARTHRITIS, ACROMIOCLAVICULAR 07/31/2008  . SPINAL STENOSIS IN CERVICAL REGION 07/31/2008  . OSTEOPOROSIS 10/17/2007  . Mixed hyperlipidemia 09/28/2007  . MDD (major depressive disorder), recurrent episode, mild (HCC) 09/28/2007  . FATTY LIVER DISEASE 09/28/2007  . Elevated liver function tests  09/28/2007  . DM (diabetes mellitus) (HCC) 06/05/2007    Past Surgical History:  Procedure Laterality Date  . BREAST SURGERY     braest lump/ benign  . CHOLECYSTECTOMY    . LUMBAR LAMINECTOMY  05/2010   Dr. Netta Corrigan  . TOTAL KNEE ARTHROPLASTY  2010   right    OB History   None      Home Medications    Prior to Admission  medications   Medication Sig Start Date End Date Taking? Authorizing Provider  alendronate (FOSAMAX) 70 MG tablet TAKE ONE TABLET EVERY 7 DAYS WITH A FULL GLASS OF WATER ON AN EMPTY STOMACH 04/04/17   Breeback, Jade L, PA-C  ALPRAZolam (XANAX) 1 MG tablet TAKE ONE TABLET BY MOUTH AT BEDTIME AS NEEDED 11/18/17   Breeback, Jade L, PA-C  AMBULATORY NON FORMULARY MEDICATION shingrix 2 dose series for shingles prevention. 07/19/17   [name], PA-C  aspirin EC 81 MG tablet Take 81 mg by mouth daily.    [provider]  atorvastatin (LIPITOR) 80 MG tablet TAKE ONE TABLET BY MOUTH EVERY DAY 06/13/17   Breeback, Jade L, PA-C  cetirizine (ZYRTEC) 10 MG tablet Take 10 mg by mouth daily.    [provider]  Cholecalciferol (VITAMIN D3) 1000 units CAPS Take by mouth.    [provider]  desvenlafaxine (PRISTIQ) 100 MG 24 hr tablet Take 1 tablet (100 mg total) by mouth daily. 11/07/17   Breeback, Jade L, PA-C  gabapentin (NEURONTIN) 400 MG capsule Take 1 capsule (400 mg total) by mouth 2 (two) times daily. 08/18/17   Rodolph Bong, MD  gabapentin (NEURONTIN) 600 MG tablet TAKE ONE TABLET BY MOUTH TWICE DAILY 10/31/17   Breeback, Jade L, PA-C  levothyroxine (SYNTHROID, LEVOTHROID) 150 MCG tablet Take 1 tablet (150 mcg total) by mouth daily. 12/19/17   Monica Becton, MD  lisinopril (PRINIVIL,ZESTRIL) 2.5 MG tablet Take 1 tablet (2.5 mg total) by mouth daily. 10/04/17   Breeback, Lonna Cobb, PA-C  meloxicam (MOBIC) 15 MG tablet Take 1 tablet (15 mg total) by mouth daily. 12/13/17   Breeback, Jade L, PA-C  metFORMIN (GLUCOPHAGE) 500 MG tablet TAKE 1 TABLET(500 MG) BY MOUTH TWICE DAILY WITH A MEAL 10/04/17   Breeback, Jade L, PA-C  methotrexate (RHEUMATREX) 15 MG tablet Take 1 tablet (15 mg total) by mouth once a week. Caution: Chemotherapy. Protect from light. 07/19/17   Breeback, Jade L, PA-C  metoprolol tartrate (LOPRESSOR) 25 MG tablet Take 0.5 tablets (12.5 mg total) by mouth 2 (two)  times daily. 12/07/17   Lewayne Bunting, MD  nitroGLYCERIN (NITROSTAT) 0.4 MG SL tablet Place 0.4 mg under the tongue every 5 (five) minutes as needed for chest pain.    [provider]  predniSONE (DELTASONE) 20 MG tablet Take 1 tablet (20 mg total) by mouth daily with breakfast. 12/30/17   Lurene Shadow, PA-C  promethazine (PHENERGAN) 25 MG tablet Take 1 tablet (25 mg total) by mouth every 6 (six) hours as needed for nausea or vomiting. 11/21/17   Breeback, Jade L, PA-C  traZODone (DESYREL) 50 MG tablet Take 2 tablets (100 mg total) by mouth at bedtime. 06/22/17   Breeback, Jade L, PA-C  triamcinolone cream (KENALOG) 0.1 % Apply 1 application topically 2 (two) times daily. 10/11/17   Rodolph Bong, MD    Family History Family History  Problem Relation Age of Onset  . Heart disease Mother 74       MI  .  Hyperlipidemia Mother   . Rheum arthritis Mother   . Lymphoma Mother   . Heart disease Father 73       MI  . Diabetes Father   . Rheum arthritis Father   . Cancer Maternal Grandmother        hodgkins lympoma  . Cirrhosis Sister        primary biliary    Social History Social History   Tobacco Use  . Smoking status: Current Every Day Smoker    Packs/day: 1.00    Years: 43.00    Pack years: 43.00    Types: Cigarettes  . Smokeless tobacco: Never Used  . Tobacco comment: Has tried Chantix and Wellbutrin in the past  Substance Use Topics  . Alcohol use: Yes    Alcohol/week: 3.0 oz    Types: 5 Standard drinks or equivalent per week    Comment: 1 beer per day  . Drug use: No     Allergies   Codeine; Naproxen; Budesonide-formoterol fumarate; Duloxetine hcl; Fentanyl; Tramadol-acetaminophen; Effexor xr [venlafaxine hcl er]; Fluticasone-salmeterol; and Lisinopril   Review of Systems Review of Systems  Constitutional: Negative for chills and fever.  Musculoskeletal: Positive for arthralgias, joint swelling and myalgias.  Skin: Negative for color change, rash and  wound.  Neurological: Negative for weakness and numbness.     Physical Exam Triage Vital Signs ED Triage Vitals  Enc Vitals Group     BP --      Pulse --      Resp --      Temp --      Temp src --      SpO2 --      Weight 12/30/17 1235 130 lb (59 kg)     Height 12/30/17 1235 5\' 3"  (1.6 m)     Head Circumference --      Peak Flow --      Pain Score 12/30/17 1232 8     Pain Loc --      Pain Edu? --      Excl. in GC? --    No data found.  Updated Vital Signs Ht 5\' 3"  (1.6 m)   Wt 130 lb (59 kg)   BMI 23.03 kg/m   Visual Acuity Right Eye Distance:   Left Eye Distance:   Bilateral Distance:    Right Eye Near:   Left Eye Near:    Bilateral Near:     Physical Exam  Constitutional: She is oriented to person, place, and time. She appears well-developed and well-nourished.  HENT:  Head: Normocephalic and atraumatic.  Eyes: EOM are normal.  Neck: Normal range of motion.  Cardiovascular: Normal rate.  Pulses:      Radial pulses are 2+ on the left side.  Pulmonary/Chest: Effort normal.  Musculoskeletal: She exhibits edema and tenderness.  Tenderness to Left lower lumbar region. No deformities in area of tenderness. Antalgic gait.  Left hand: bony nodules c/w RA. Tenderness and mild edema at base of Left thumb. Limited ROM  Neurological: She is alert and oriented to person, place, and time.  Skin: Skin is warm and dry. Capillary refill takes less than 2 seconds. No rash noted. No erythema.  Psychiatric: She has a normal mood and affect. Her behavior is normal.  Nursing note and vitals reviewed.    UC Treatments / Results  Labs (all labs ordered are listed, but only abnormal results are displayed) Labs Reviewed - No data to display  EKG None  Radiology No  results found.  Procedures Procedures (including critical care time)  Medications Ordered in UC Medications  dexamethasone (DECADRON) injection 10 mg (10 mg Intramuscular Given 12/30/17 1324)    Initial  Impression / Assessment and Plan / UC Course  I have reviewed the triage vital signs and the nursing notes.  Pertinent labs & imaging results that were available during my care of the patient were reviewed by me and considered in my medical decision making (see chart for details).     Will tx RA flare with steroids as pt has done well with prednisone and steroid injections in the past.  Encouraged f/u with PCP and rheumatologist   Final Clinical Impressions(s) / UC Diagnoses   Final diagnoses:  Rheumatoid arthritis flare (HCC)  Acute left-sided low back pain without sciatica  Chronic pain of left thumb     Discharge Instructions      Please follow up with Tandy Gaw, PA, or your rheumatologist next week as needed.     ED Prescriptions    Medication Sig Dispense Auth. Provider   predniSONE (DELTASONE) 20 MG tablet Take 1 tablet (20 mg total) by mouth daily with breakfast. 5 tablet Lurene Shadow, PA-C     Controlled Substance Prescriptions [place] Controlled Substance Registry consulted? Not Applicable   Rolla Plate 12/30/17 1404

## 2017-12-30 NOTE — Discharge Instructions (Signed)
°  Please follow up with Tandy Gaw, PA, or your rheumatologist next week as needed.

## 2017-12-30 NOTE — ED Triage Notes (Signed)
Chronic RA, Left thumb pain x 1 week LBP, left side

## 2018-01-04 ENCOUNTER — Ambulatory Visit (INDEPENDENT_AMBULATORY_CARE_PROVIDER_SITE_OTHER): Payer: Medicare Other

## 2018-01-04 DIAGNOSIS — Z1231 Encounter for screening mammogram for malignant neoplasm of breast: Secondary | ICD-10-CM | POA: Diagnosis not present

## 2018-01-06 NOTE — Progress Notes (Signed)
Call pt: normal mammogram. Follow up in 1 year.

## 2018-01-25 ENCOUNTER — Ambulatory Visit (INDEPENDENT_AMBULATORY_CARE_PROVIDER_SITE_OTHER): Payer: Medicare Other | Admitting: Physician Assistant

## 2018-01-25 ENCOUNTER — Encounter: Payer: Self-pay | Admitting: Physician Assistant

## 2018-01-25 VITALS — BP 155/61 | HR 77 | Ht 63.0 in | Wt 130.0 lb

## 2018-01-25 DIAGNOSIS — Z1159 Encounter for screening for other viral diseases: Secondary | ICD-10-CM

## 2018-01-25 DIAGNOSIS — I2583 Coronary atherosclerosis due to lipid rich plaque: Secondary | ICD-10-CM

## 2018-01-25 DIAGNOSIS — Z0000 Encounter for general adult medical examination without abnormal findings: Secondary | ICD-10-CM

## 2018-01-25 DIAGNOSIS — I2510 Atherosclerotic heart disease of native coronary artery without angina pectoris: Secondary | ICD-10-CM

## 2018-01-25 DIAGNOSIS — E118 Type 2 diabetes mellitus with unspecified complications: Secondary | ICD-10-CM

## 2018-01-25 DIAGNOSIS — E559 Vitamin D deficiency, unspecified: Secondary | ICD-10-CM

## 2018-01-25 NOTE — Patient Instructions (Signed)

## 2018-01-25 NOTE — Progress Notes (Signed)
Subjective:   [name] is a 67 y.o. female who presents for Medicare Annual (Subsequent) preventive examination.  Review of Systems:  No concerns or complaints.        Objective:     Vitals: BP (!) 155/61   Pulse 77   Ht _0  (1.6 m)   Wt 130 lb (59 kg)   BMI 23.03 kg/m   Body mass index is 23.03 kg/m.  Advanced Directives 09/26/2017 10/09/2014  Does Patient Have a Medical Advance Directive? Yes Yes  Type of Advance Directive Living will -  Copy of [place] in Chart? - Yes    Tobacco Social History   Tobacco Use  Smoking Status Current Every Day Smoker  . Packs/day: 1.00  . Years: 43.00  . Pack years: 43.00  . Types: Cigarettes  Smokeless Tobacco Never Used  Tobacco Comment   Has tried Chantix and Wellbutrin in the past     Ready to quit: Not Answered Counseling given: Not Answered Comment: Has tried Chantix and Wellbutrin in the past   Clinical Intake:                       Past Medical History:  Diagnosis Date  . CAD (coronary artery disease)   . Cervicalgia   . Chronic pain   . COPD (chronic obstructive pulmonary disease) ([place])   . DDD (degenerative disc disease), lumbar   . Diabetes mellitus   . Fatty liver   . Hyperlipidemia   . Narcotic abuse ([place])    history  . Osteoarthritis   . Osteoporosis   . Rheumatoid arthritis ([place])   . Thyroid disease    hypothyroid   Past Surgical History:  Procedure Laterality Date  . BREAST SURGERY     braest lump/ benign  . CHOLECYSTECTOMY    . LUMBAR LAMINECTOMY  05/2010   Dr. Rushie Nyhan  . TOTAL KNEE ARTHROPLASTY  2010   right   Family History  Problem Relation Age of Onset  . Heart disease Mother 42       MI  . Hyperlipidemia Mother   . Rheum arthritis Mother   . Lymphoma Mother   . Heart disease Father 60       MI  . Diabetes Father   . Rheum arthritis Father   . Cancer Maternal Grandmother        hodgkins lympoma  . Cirrhosis Sister        primary  biliary   Social History   Socioeconomic History  . Marital status: Married    Spouse name: Not on file  . Number of children: 2  . Years of education: Not on file  . Highest education level: Not on file  Occupational History  . Not on file  Social Needs  . Financial resource strain: Not on file  . Food insecurity:    Worry: Not on file    Inability: Not on file  . Transportation needs:    Medical: Not on file    Non-medical: Not on file  Tobacco Use  . Smoking status: Current Every Day Smoker    Packs/day: 1.00    Years: 43.00    Pack years: 43.00    Types: Cigarettes  . Smokeless tobacco: Never Used  . Tobacco comment: Has tried Chantix and Wellbutrin in the past  Substance and Sexual Activity  . Alcohol use: Yes    Alcohol/week: 5.0 standard drinks    Types: 5  Standard drinks or equivalent per week    Comment: 1 beer per day  . Drug use: No  . Sexual activity: Not on file  Lifestyle  . Physical activity:    Days per week: Not on file    Minutes per session: Not on file  . Stress: Not on file  Relationships  . Social connections:    Talks on phone: Not on file    Gets together: Not on file    Attends religious service: Not on file    Active member of club or organization: Not on file    Attends meetings of clubs or organizations: Not on file    Relationship status: Not on file  Other Topics Concern  . Not on file  Social History Narrative  . Not on file    Outpatient Encounter Medications as of 01/25/2018  Medication Sig  . alendronate (FOSAMAX) 70 MG tablet TAKE ONE TABLET EVERY 7 DAYS WITH A FULL GLASS OF WATER ON AN EMPTY STOMACH  . ALPRAZolam (XANAX) 1 MG tablet TAKE ONE TABLET BY MOUTH AT BEDTIME AS NEEDED  . AMBULATORY NON FORMULARY MEDICATION shingrix 2 dose series for shingles prevention.  [name] aspirin EC 81 MG tablet Take 81 mg by mouth daily.  [name] atorvastatin (LIPITOR) 80 MG tablet TAKE ONE TABLET BY MOUTH EVERY DAY  . cetirizine (ZYRTEC) 10 MG  tablet Take 10 mg by mouth daily.  . Cholecalciferol (VITAMIN D3) 1000 units CAPS Take by mouth.  . desvenlafaxine (PRISTIQ) 100 MG 24 hr tablet Take 1 tablet (100 mg total) by mouth daily.  [name] gabapentin (NEURONTIN) 400 MG capsule Take 1 capsule (400 mg total) by mouth 2 (two) times daily.  [name] gabapentin (NEURONTIN) 600 MG tablet TAKE ONE TABLET BY MOUTH TWICE DAILY  . levothyroxine (SYNTHROID, LEVOTHROID) 150 MCG tablet Take 1 tablet (150 mcg total) by mouth daily.  [name] lisinopril (PRINIVIL,ZESTRIL) 2.5 MG tablet Take 1 tablet (2.5 mg total) by mouth daily.  . meloxicam (MOBIC) 15 MG tablet Take 1 tablet (15 mg total) by mouth daily.  . metFORMIN (GLUCOPHAGE) 500 MG tablet TAKE 1 TABLET(500 MG) BY MOUTH TWICE DAILY WITH A MEAL  . methotrexate (RHEUMATREX) 15 MG tablet Take 1 tablet (15 mg total) by mouth once a week. Caution: Chemotherapy. Protect from light.  . metoprolol tartrate (LOPRESSOR) 25 MG tablet Take 0.5 tablets (12.5 mg total) by mouth 2 (two) times daily.  . nitroGLYCERIN (NITROSTAT) 0.4 MG SL tablet Place 0.4 mg under the tongue every 5 (five) minutes as needed for chest pain.  . predniSONE (DELTASONE) 20 MG tablet Take 1 tablet (20 mg total) by mouth daily with breakfast.  . promethazine (PHENERGAN) 25 MG tablet Take 1 tablet (25 mg total) by mouth every 6 (six) hours as needed for nausea or vomiting.  . traZODone (DESYREL) 50 MG tablet Take 2 tablets (100 mg total) by mouth at bedtime.  . triamcinolone cream (KENALOG) 0.1 % Apply 1 application topically 2 (two) times daily.   No facility-administered encounter medications on file as of 01/25/2018.     Activities of Daily Living No flowsheet data found.  Patient Care Team: Lavada Mesi as PCP - General (Family Medicine) Park Breed, MD as Referring Physician (Psychiatry)    Assessment:   This is a routine wellness examination for [name].  Exercise Activities and Dietary recommendations    Goals   None      Fall Risk Fall Risk  01/25/2018  Falls in the past  year? No   Is the patient's home free of loose throw rugs in walkways, pet beds, electrical cords, etc?   no      Grab bars in the bathroom? no      Handrails on the stairs?   Yes       Adequate lighting?   yes  Timed Get Up and Go performed:   Depression Screen PHQ 2/9 Scores 01/25/2018 03/09/2017  PHQ - 2 Score 0 1     Cognitive Function     6CIT Screen 01/25/2018  What Year? 0 points  What month? 0 points  What time? 0 points  Count back from 20 0 points  Months in reverse 0 points  Repeat phrase 0 points  Total Score 0    Immunization History  Administered Date(s) Administered  . H1N1 07/03/2008  . Influenza Split 03/05/2011, 06/14/2012  . Influenza Whole 03/14/2008  . Influenza, High Dose Seasonal PF 05/28/2016  . Influenza,inj,Quad PF,6+ Mos 03/14/2013, 04/02/2014, 06/07/2017  . MMR 10/26/2017  . Pneumococcal Conjugate-13 09/21/2013, 07/19/2017  . Pneumococcal Polysaccharide-23 05/31/2001  . Td 10/17/2007  . Zoster 12/08/2010    Qualifies for Shingles Vaccine? Zoster completed.   Screening Tests Health Maintenance  Topic Date Due  . Hepatitis C Screening  1950-11-17  . INFLUENZA VACCINE  09/14/2018 (Originally 12/29/2017)  . TETANUS/TDAP  01/26/2019 (Originally 10/16/2017)  . HEMOGLOBIN A1C  04/06/2018  . PNA vac Low Risk Adult (2 of 2 - PPSV23) 07/19/2018  . OPHTHALMOLOGY EXAM  09/03/2018  . FOOT EXAM  10/05/2018  . MAMMOGRAM  01/05/2020  . Fecal DNA (Cologuard)  09/23/2020  . DEXA SCAN  Completed    Cancer Screenings: Lung: Low Dose CT Chest recommended if Age 48-80 years, 30 pack-year currently smoking OR have quit w/in 15years. Patient does qualify. DONE. Breast:  Up to date on Mammogram? Yes   Up to date of Bone Density/Dexa? Yes Colorectal: scheduled for tomorrow.   Additional Screenings: ordered today Hepatitis C Screening:      Plan:     I have personally reviewed and noted the  following in the patient's chart:   . Medical and social history . Use of alcohol, tobacco or illicit drugs  . Current medications and supplements . Functional ability and status . Nutritional status . Physical activity . Advanced directives . List of other physicians . Hospitalizations, surgeries, and ER visits in previous 12 months . Vitals . Screenings to include cognitive, depression, and falls . Referrals and appointments  Fasting labs ordered.  Mammogram/DEXA up to date.  Colonoscopy to be done tomorrow.   In addition, I have reviewed and discussed with patient certain preventive protocols, quality metrics, and best practice recommendations. A written personalized care plan for preventive services as well as general preventive health recommendations were provided to patient.     Iran Planas, PA-C  01/25/2018

## 2018-01-30 ENCOUNTER — Emergency Department (INDEPENDENT_AMBULATORY_CARE_PROVIDER_SITE_OTHER)
Admission: EM | Admit: 2018-01-30 | Discharge: 2018-01-30 | Disposition: A | Discharge status: Home / Self Care | Payer: Medicare Other | Source: Home / Self Care

## 2018-01-30 ENCOUNTER — Encounter: Payer: Self-pay | Admitting: Emergency Medicine

## 2018-01-30 DIAGNOSIS — M79641 Pain in right hand: Secondary | ICD-10-CM

## 2018-01-30 DIAGNOSIS — M069 Rheumatoid arthritis, unspecified: Secondary | ICD-10-CM

## 2018-01-30 MED ORDER — METHYLPREDNISOLONE SODIUM SUCC 125 MG IJ SOLR
125.0000 mg | Freq: Once | INTRAMUSCULAR | Status: AC
  Administered 2018-01-30: 125 mg via INTRAMUSCULAR

## 2018-01-30 MED ORDER — PREDNISONE 20 MG PO TABS: 20.0000 mg | ORAL_TABLET | Freq: Every day | ORAL | 0 refills | Status: DC

## 2018-01-30 NOTE — Discharge Instructions (Addendum)
Keep follow-up appointment with Rheumatologist. For pain, I recommend acetaminophen 650 mg every 6 hours as needed for pain.  Not to exceed 4,000 mg within 24 hours.

## 2018-01-30 NOTE — ED Triage Notes (Signed)
Pt c/o right hand pain x5 days. States she has RA and sees her rheumatologist this week.

## 2018-01-30 NOTE — ED Provider Notes (Signed)
[facility]    CSN: [id number] Arrival date & time: 01/30/18  1322     History   Chief Complaint Chief Complaint  Patient presents with  . Hand Pain    HPI [name] is a 67 y.o. female.     Rheumatoid Arthritis Flare-up   Patient suffers from chronic RA and was last seen at [facility]  12/30/2017 for the same complaint.   Current problem: Right hand pain, secondary to Rheumatoid Arthritis   Onset: 5 days ago. She recently ran out her chronic prednisone and is requesting a bridge of prednisone until her appointment with Rheumatology in 3 days. Reports PIP joint pain and wrist pain. She has attempted relief with NSAIDS, heat and wearing a wrist splint without improve or relief of pain.  Past Medical History:  Diagnosis Date  . CAD (coronary artery disease)   . Cervicalgia   . Chronic pain   . COPD (chronic obstructive pulmonary disease) ([facility])   . DDD (degenerative disc disease), lumbar   . Diabetes mellitus   . Fatty liver   . Hyperlipidemia   . Narcotic abuse ([facility])    history  . Osteoarthritis   . Osteoporosis   . Rheumatoid arthritis ([facility])   . Thyroid disease    hypothyroid    Patient Active Problem List   Diagnosis Date Noted  . Vasovagal syncope 12/16/2017  . History of diverticulitis 11/21/2017  . Non-intractable vomiting with nausea 11/21/2017  . Pyelonephritis 11/21/2017  . Left lower quadrant guarding 11/21/2017  . Generalized abdominal pain 11/21/2017  . Positive colorectal cancer screening using Cologuard test 10/07/2017  . Microalbuminuria 10/07/2017  . Weakness of both lower extremities 10/07/2017  . Bilateral cataracts 09/07/2017  . Dry eye syndrome of bilateral lacrimal glands 09/07/2017  . Moderate episode of recurrent major depressive disorder ([facility]) 07/24/2017  . Bilateral calf pain 07/24/2017  . Memory changes 07/24/2017  . Abnormal finding on GI tract imaging 05/25/2017  . Lumbar degenerative disc disease 05/13/2017  . Common  bile duct dilatation 04/27/2017  . Left upper arm swelling secondary to rotator cuff tear and fluid tracking down biceps tendon sheath 04/14/2017  . Anxiety 03/12/2017  . Rotator cuff tendonitis, left 03/10/2017  . Myocardial infarct ([facility]) 03/09/2017  . CAD (coronary artery disease) 03/09/2017  . Rheumatoid arthritis involving both hands ([facility]) 03/09/2017  . RA (rheumatoid arthritis) ([facility]) 07/06/2016  . Atherosclerosis of native coronary artery of native heart without angina pectoris 03/16/2016  . Chronic back pain 02/27/2016  . Bulging lumbar disc 02/27/2016  . Acquired hypothyroidism 12/01/2015  . 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 Hyperlipidemia 01/22/2015  . Primary osteoarthritis of left wrist 12/10/2014  . COLD (chronic obstructive lung disease) (H[facility]) 12/06/2013  . Acute pain of left shoulder 09/23/2013  . Ganglion cyst of wrist 07/13/2013  . Anxiety state 05/16/2013  . Trochanteric bursitis of right hip 05/04/2013  . Diabetes type 2, controlled ([facility]) 03/14/2013  . Hypothyroidism 03/14/2013  . Arthritis of carpometacarpal joint 02/14/2012  . Sacroiliitis ([facility]CC) 09/14/2011  . Chronic headache 04/03/2011  . Tobacco use 04/03/2011  . Chronic pain 04/03/2011  . Benign hypertension 04/03/2011  . Allergic rhinitis 10/06/2010  . HYPERCALCEMIA 11/13/2009  . ARTHRITIS, ACROMIOCLAVICULAR 07/31/2008  . SPINAL STENOSIS IN CERVICAL REGION 07/31/2008  . OSTEOPOROSIS 10/17/2007  . Mixed hyperlipidemia 09/28/2007  . MDD (major depressive disorder), recurrent episode, mild (HCC) 09/28/2007  . FATTY LIVER DISEASE 09/28/2007  . Elevated liver function tests  09/28/2007  . DM (diabetes mellitus) (HCC) 06/05/2007    Past Surgical History:  Procedure Laterality Date  . BREAST SURGERY     braest lump/ benign  . CHOLECYSTECTOMY    . LUMBAR LAMINECTOMY  05/2010   Dr. Netta Corrigan  . TOTAL KNEE ARTHROPLASTY  2010   right    OB History     None      Home Medications    Prior to Admission medications   Medication Sig Start Date End Date Taking? Authorizing Provider  alendronate (FOSAMAX) 70 MG tablet TAKE ONE TABLET EVERY 7 DAYS WITH A FULL GLASS OF WATER ON AN EMPTY STOMACH 04/04/17   Breeback, Jade L, PA-C  ALPRAZolam (XANAX) 1 MG tablet TAKE ONE TABLET BY MOUTH AT BEDTIME AS NEEDED 11/18/17   Breeback, Jade L, PA-C  AMBULATORY NON FORMULARY MEDICATION shingrix 2 dose series for shingles prevention. 07/19/17   [name], PA-C  aspirin EC 81 MG tablet Take 81 mg by mouth daily.    [provider]  atorvastatin (LIPITOR) 80 MG tablet TAKE ONE TABLET BY MOUTH EVERY DAY 06/13/17   Breeback, Jade L, PA-C  cetirizine (ZYRTEC) 10 MG tablet Take 10 mg by mouth daily.    [provider]  Cholecalciferol (VITAMIN D3) 1000 units CAPS Take by mouth.    [provider]  desvenlafaxine (PRISTIQ) 100 MG 24 hr tablet Take 1 tablet (100 mg total) by mouth daily. 11/07/17   Breeback, Jade L, PA-C  gabapentin (NEURONTIN) 400 MG capsule Take 1 capsule (400 mg total) by mouth 2 (two) times daily. 08/18/17   Rodolph Bong, MD  gabapentin (NEURONTIN) 600 MG tablet TAKE ONE TABLET BY MOUTH TWICE DAILY 10/31/17   Breeback, Jade L, PA-C  levothyroxine (SYNTHROID, LEVOTHROID) 150 MCG tablet Take 1 tablet (150 mcg total) by mouth daily. 12/19/17   Monica Becton, MD  lisinopril (PRINIVIL,ZESTRIL) 2.5 MG tablet Take 1 tablet (2.5 mg total) by mouth daily. 10/04/17   Breeback, Lonna Cobb, PA-C  meloxicam (MOBIC) 15 MG tablet Take 1 tablet (15 mg total) by mouth daily. 12/13/17   Breeback, Jade L, PA-C  metFORMIN (GLUCOPHAGE) 500 MG tablet TAKE 1 TABLET(500 MG) BY MOUTH TWICE DAILY WITH A MEAL 10/04/17   Breeback, Jade L, PA-C  methotrexate (RHEUMATREX) 15 MG tablet Take 1 tablet (15 mg total) by mouth once a week. Caution: Chemotherapy. Protect from light. 07/19/17   Breeback, Jade L, PA-C  metoprolol tartrate (LOPRESSOR) 25 MG  tablet Take 0.5 tablets (12.5 mg total) by mouth 2 (two) times daily. 12/07/17   Lewayne Bunting, MD  nitroGLYCERIN (NITROSTAT) 0.4 MG SL tablet Place 0.4 mg under the tongue every 5 (five) minutes as needed for chest pain.    [provider]  predniSONE (DELTASONE) 20 MG tablet Take 1 tablet (20 mg total) by mouth daily with breakfast. 12/30/17   Lurene Shadow, PA-C  promethazine (PHENERGAN) 25 MG tablet Take 1 tablet (25 mg total) by mouth every 6 (six) hours as needed for nausea or vomiting. 11/21/17   Breeback, Jade L, PA-C  traZODone (DESYREL) 50 MG tablet Take 2 tablets (100 mg total) by mouth at bedtime. 06/22/17   Breeback, Jade L, PA-C  triamcinolone cream (KENALOG) 0.1 % Apply 1 application topically 2 (two) times daily. 10/11/17   Rodolph Bong, MD    Family History Family History  Problem Relation Age of Onset  . Heart disease Mother 54       MI  .  Hyperlipidemia Mother   . Rheum arthritis Mother   . Lymphoma Mother   . Heart disease Father 45       MI  . Diabetes Father   . Rheum arthritis Father   . Cancer Maternal Grandmother        hodgkins lympoma  . Cirrhosis Sister        primary biliary    Social History Social History   Tobacco Use  . Smoking status: Current Every Day Smoker    Packs/day: 1.00    Years: 43.00    Pack years: 43.00    Types: Cigarettes  . Smokeless tobacco: Never Used  . Tobacco comment: Has tried Chantix and Wellbutrin in the past  Substance Use Topics  . Alcohol use: Yes    Alcohol/week: 5.0 standard drinks    Types: 5 Standard drinks or equivalent per week    Comment: 1 beer per day  . Drug use: No     Allergies   Codeine; Naproxen; Budesonide-formoterol fumarate; Duloxetine hcl; Fentanyl; Tramadol-acetaminophen; Effexor xr [venlafaxine hcl er]; Fluticasone-salmeterol; and Lisinopril Review of Systems Review of Systems Pertinent negatives listed in HP   Triage Vital Signs ED Triage Vitals [01/30/18 1348]  Enc Vitals  Group     BP 131/74     Pulse Rate 74     Resp      Temp 98 F (36.7 C)     Temp Source Oral     SpO2 96 %     Weight 130 lb (59 kg)     Height      Head Circumference      Peak Flow      Pain Score 6     Pain Loc      Pain Edu?      Excl. in GC?    No data found.  Updated Vital Signs BP 131/74 (BP Location: Right Arm)   Pulse 74   Temp 98 F (36.7 C) (Oral)   Wt 130 lb (59 kg)   SpO2 96%   BMI 23.03 kg/m   Visual Acuity Right Eye Distance:   Left Eye Distance:   Bilateral Distance:    Right Eye Near:   Left Eye Near:    Bilateral Near:     Physical Exam General appearance: alert, well developed, well nourished, cooperative and in no distress Head: Normocephalic, without obvious abnormality, atraumatic Respiratory: Respirations even and unlabored, normal respiratory rate Extremities:Gross deformities noted bilateral hand and digits with Bouchard nodule PIP joints  and Heberden nodules of DIP joints. Right hand joints warm, erythema present, and warmth at joints of digits and right wrist  Skin: Skin color, texture, turgor normal. No rashes seen  Psych: Appropriate mood and affect. Neurologic: Mental status: Alert, oriented to person, place, and time, thought content appropriate.     UC Treatments / Results  Labs (all labs ordered are listed, but only abnormal results are displayed) Labs Reviewed - No data to display  EKG None  Radiology No results found.  Procedures Procedures (including critical care time)  Medications Ordered in UC Medications - No data to display  Initial Impression / Assessment and Plan / UC Course  I have reviewed the triage vital signs and the nursing notes.  Pertinent labs & imaging results that were available during my care of the patient were reviewed by me and considered in my medical decision making (see chart for details).   Patient, with chronic RA, presents today requesting a bridge of  chronic prednisone to last  until her appointment with Rheumatology in 3 days. Current flare is mostly affecting the right hand, digits, and wrist. Provided 3 days quantity of prednisone and recommend extra strength acetaminophen around the clock for pain. No pain medication given as patient has a history of narcotic abuse and multiple allergies to common medications used to treat acute pain. Patient provided this information and verbalized understanding.  Final Clinical Impressions(s) / UC Diagnoses   Final diagnoses:  Right hand pain  Rheumatoid arthritis involving right hand, unspecified rheumatoid factor presence (HCC)     Discharge Instructions     Keep follow-up appointment with Rheumatologist. For pain, I recommend acetaminophen 650 mg every 6 hours as needed for pain.  Not to exceed 4,000 mg within 24 hours.      ED Prescriptions    Medication Sig Dispense Auth. Provider   predniSONE (DELTASONE) 20 MG tablet Take 1 tablet (20 mg total) by mouth daily with breakfast. 3 tablet Bing Neighbors, FNP     Controlled Substance Prescriptions [place] Controlled Substance Registry consulted? Not Applicable   Bing Neighbors, FNP 01/31/18 2140

## 2018-01-31 DIAGNOSIS — M0579 Rheumatoid arthritis with rheumatoid factor of multiple sites without organ or systems involvement: Secondary | ICD-10-CM | POA: Diagnosis not present

## 2018-01-31 DIAGNOSIS — Z79899 Other long term (current) drug therapy: Secondary | ICD-10-CM | POA: Diagnosis not present

## 2018-02-01 DIAGNOSIS — M0579 Rheumatoid arthritis with rheumatoid factor of multiple sites without organ or systems involvement: Secondary | ICD-10-CM | POA: Diagnosis not present

## 2018-02-01 DIAGNOSIS — M19042 Primary osteoarthritis, left hand: Secondary | ICD-10-CM | POA: Diagnosis not present

## 2018-02-01 DIAGNOSIS — M19041 Primary osteoarthritis, right hand: Secondary | ICD-10-CM | POA: Diagnosis not present

## 2018-02-01 DIAGNOSIS — Z79899 Other long term (current) drug therapy: Secondary | ICD-10-CM | POA: Diagnosis not present

## 2018-02-02 ENCOUNTER — Other Ambulatory Visit: Payer: Self-pay | Admitting: Physician Assistant

## 2018-02-02 DIAGNOSIS — F331 Major depressive disorder, recurrent, moderate: Secondary | ICD-10-CM

## 2018-02-13 ENCOUNTER — Other Ambulatory Visit: Payer: Self-pay | Admitting: Physician Assistant

## 2018-02-16 DIAGNOSIS — R195 Other fecal abnormalities: Secondary | ICD-10-CM | POA: Diagnosis not present

## 2018-03-08 ENCOUNTER — Ambulatory Visit (INDEPENDENT_AMBULATORY_CARE_PROVIDER_SITE_OTHER): Payer: Medicare Other | Admitting: Physician Assistant

## 2018-03-08 ENCOUNTER — Encounter: Payer: Self-pay | Admitting: Physician Assistant

## 2018-03-08 VITALS — BP 130/81 | HR 89 | Temp 98.0°F | Ht 62.75 in | Wt 133.0 lb

## 2018-03-08 DIAGNOSIS — I2510 Atherosclerotic heart disease of native coronary artery without angina pectoris: Secondary | ICD-10-CM | POA: Diagnosis not present

## 2018-03-08 DIAGNOSIS — R110 Nausea: Secondary | ICD-10-CM

## 2018-03-08 DIAGNOSIS — R1010 Upper abdominal pain, unspecified: Secondary | ICD-10-CM | POA: Diagnosis not present

## 2018-03-08 DIAGNOSIS — Z23 Encounter for immunization: Secondary | ICD-10-CM

## 2018-03-08 DIAGNOSIS — I2583 Coronary atherosclerosis due to lipid rich plaque: Secondary | ICD-10-CM | POA: Diagnosis not present

## 2018-03-08 DIAGNOSIS — Z8719 Personal history of other diseases of the digestive system: Secondary | ICD-10-CM | POA: Diagnosis not present

## 2018-03-08 LAB — POCT URINALYSIS DIPSTICK
Bilirubin, UA: NEGATIVE
GLUCOSE UA: NEGATIVE
Ketones, UA: NEGATIVE
LEUKOCYTES UA: NEGATIVE
Nitrite, UA: NEGATIVE
Protein, UA: NEGATIVE
RBC UA: NEGATIVE
Spec Grav, UA: <=1.005 — AB (ref 1.010–1.025)
Urobilinogen, UA: 0.2 E.U./dL
pH, UA: 6 (ref 5.0–8.0)

## 2018-03-08 MED ORDER — PROMETHAZINE HCL 25 MG PO TABS: 25.0000 mg | ORAL_TABLET | Freq: Four times a day (QID) | ORAL | 0 refills | Status: DC | PRN

## 2018-03-08 MED ORDER — SUCRALFATE 1 G PO TABS: 1.0000 g | ORAL_TABLET | Freq: Three times a day (TID) | ORAL | 0 refills | Status: DC

## 2018-03-08 MED ORDER — PANTOPRAZOLE SODIUM 40 MG PO TBEC: 40.0000 mg | DELAYED_RELEASE_TABLET | Freq: Every day | ORAL | 3 refills | Status: DC

## 2018-03-08 NOTE — Patient Instructions (Signed)
Start carafate and protonix.  Will call with breath test results.  If not better by Friday or worsening will treat for diverticulitis.  Follow up in 2 weeks.    Gastritis, Adult Gastritis is swelling (inflammation) of the stomach. When you have this condition, you can have these problems (symptoms):  Pain in your stomach.  A burning feeling in your stomach.  Feeling sick to your stomach (nauseous).  Throwing up (vomiting).  Feeling too full after you eat.  It is important to get help for this condition. Without help, your stomach can bleed, and you can get sores (ulcers) in your stomach. Follow these instructions at home:  Take over-the-counter and prescription medicines only as told by your doctor.  If you were prescribed an antibiotic medicine, take it as told by your doctor. Do not stop taking it even if you start to feel better.  Drink enough fluid to keep your pee (urine) clear or pale yellow.  Instead of eating big meals, eat small meals often. Contact a health care provider if:  Your problems get worse.  Your problems go away and then come back. Get help right away if:  You throw up blood or something that looks like coffee grounds.  You have black or dark red poop (stools).  You cannot keep fluids down.  Your stomach pain gets worse.  You have a fever.  You do not feel better after 1 week. This information is not intended to replace advice given to you by your health care provider. Make sure you discuss any questions you have with your health care provider. Document Released: 11/03/2007 Document Revised: 01/14/2016 Document Reviewed: 02/08/2015 Elsevier Interactive Patient Education  [employer].

## 2018-03-08 NOTE — Progress Notes (Signed)
Subjective:    Patient ID: [name], female    DOB: May 21, 1951, 67 y.o.   MRN: [id number]  HPI  Pt is a 67 yo female with CAD, past MI, COPD, HTN, T2DM, osteoporosis, RA who presents to the clinic with 2 weeks of upper abdominal pain. Pt is not on any acid reflux medications but reports feelings of reflux. She vomited once. She is having intermittent nausea. No stool changes. No melena or hematochezia. She did have positive cologuard but has not yet had colonoscopy. She has just recently had prednisone but denies any NSAID usage. She denies any problems swallowing.she denies any CP. No fever, body aches. She will have some chills.  No urinary symptoms other than the frequency she always has. She does have a recent hx of pyelonephritis. She has also had diverticulitis in the past.    .. Active Ambulatory Problems    Diagnosis Date Noted  . DM (diabetes mellitus) (HCC) 06/05/2007  . Mixed hyperlipidemia 09/28/2007  . HYPERCALCEMIA 11/13/2009  . MDD (major depressive disorder), recurrent episode, mild (HCC) 09/28/2007  . FATTY LIVER DISEASE 09/28/2007  . ARTHRITIS, ACROMIOCLAVICULAR 07/31/2008  . SPINAL STENOSIS IN CERVICAL REGION 07/31/2008  . OSTEOPOROSIS 10/17/2007  . Elevated liver function tests 09/28/2007  . Allergic rhinitis 10/06/2010  . Chronic headache 04/03/2011  . Tobacco use 04/03/2011  . Chronic pain 04/03/2011  . Benign hypertension 04/03/2011  . Sacroiliitis (HCC) 09/14/2011  . Arthritis of carpometacarpal joint 02/14/2012  . Diabetes type 2, controlled (HCC) 03/14/2013  . Hypothyroidism 03/14/2013  . Trochanteric bursitis of right hip 05/04/2013  . Anxiety state 05/16/2013  . Ganglion cyst of wrist 07/13/2013  . Acute pain of left shoulder 09/23/2013  . COLD (chronic obstructive lung disease) (HCC) 12/06/2013  . Primary osteoarthritis of left wrist 12/10/2014  . Hyperlipidemia 01/22/2015  . 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 Myocardial infarct (HCC) 03/09/2017  . CAD (coronary artery disease) 03/09/2017  . Rheumatoid arthritis involving both hands (HCC) 03/09/2017  . Rotator cuff tendonitis, left 03/10/2017  . Anxiety 03/12/2017  . Left upper arm swelling secondary to rotator cuff tear and fluid tracking down biceps tendon sheath 04/14/2017  . Common bile duct dilatation 04/27/2017  . Lumbar degenerative disc disease 05/13/2017  . Abnormal finding on GI tract imaging 05/25/2017  . Chronic back pain 02/27/2016  . Atherosclerosis of native coronary artery of native heart without angina pectoris 03/16/2016  . Bulging lumbar disc 02/27/2016  . RA (rheumatoid arthritis) (HCC) 07/06/2016  . Acquired hypothyroidism 12/01/2015  . Moderate episode of recurrent major depressive disorder (HCC) 07/24/2017  . Bilateral calf pain 07/24/2017  . Memory changes 07/24/2017  . Bilateral cataracts 09/07/2017  . Dry eye syndrome of bilateral lacrimal glands 09/07/2017  . Positive colorectal cancer screening using Cologuard test 10/07/2017  . Microalbuminuria 10/07/2017  . Weakness of both lower extremities 10/07/2017  . History of diverticulitis 11/21/2017  . Non-intractable vomiting with nausea 11/21/2017  . Pyelonephritis 11/21/2017  . Left lower quadrant guarding 11/21/2017  . Generalized abdominal pain 11/21/2017  . Vasovagal syncope 12/16/2017  . Upper abdominal pain 03/10/2018   Resolved Ambulatory Problems    Diagnosis Date Noted  . Chronic pain syndrome 06/05/2007  . HEMORRHOIDS, WITH BLEEDING 04/22/2008  . Acute maxillary sinusitis 04/10/2010  . Alcoholic fatty liver 12/24/2009  . RENAL INSUFFICIENCY, ACUTE 10/23/2008  . ROTATOR CUFF SYNDROME, RIGHT 04/01/2008  . ALLERGIC REACTION 12/24/2009  . ELEVATED BLOOD PRESSURE WITHOUT DIAGNOSIS  OF HYPERTENSION 05/13/2010  . CHRONIC OBSTRUCTIVE PULMONARY DISEASE, ACUTE EXACERBATION 08/14/2010  . Recurrent acute sinusitis 09/09/2010  . Allergic  dermatitis due to poison ivy 11/27/2010  . DERMATITIS, ATOPIC 01/15/2011  . Ganglion cyst of left foot 07/13/2013  . Chronic pain syndrome 12/05/2013   Past Medical History:  Diagnosis Date  . Cervicalgia   . COPD (chronic obstructive pulmonary disease) (HCC)   . DDD (degenerative disc disease), lumbar   . Diabetes mellitus   . Fatty liver   . Narcotic abuse (HCC)   . Osteoarthritis   . Osteoporosis   . Rheumatoid arthritis (HCC)   . Thyroid disease     Review of Systems See HPI.     Objective:   Physical Exam  Constitutional: She appears well-developed and well-nourished.  HENT:  Head: Normocephalic and atraumatic.  Cardiovascular: Normal rate and regular rhythm.  Pulmonary/Chest: Effort normal and breath sounds normal.  Coarse breath sounds.  No CVA tenderness.   Abdominal: Soft. Bowel sounds are normal. She exhibits no distension and no mass. There is tenderness. There is no rebound and no guarding.  Tenderness throughout the entire abdomen. Worse in upper abdomen around epigastric are. No guarding or rebound.   Neurological: She is alert.  Psychiatric: She has a normal mood and affect. Her behavior is normal.          Assessment & Plan:  Marland KitchenMarland KitchenDiagnoses and all orders for this visit:  Upper abdominal pain -     sucralfate (CARAFATE) 1 g tablet; Take 1 tablet (1 g total) by mouth 4 (four) times daily -  with meals and at bedtime. -     pantoprazole (PROTONIX) 40 MG tablet; Take 1 tablet (40 mg total) by mouth daily. -     H. pylori breath test -     POCT Urinalysis Dipstick -     Lipase -     COMPLETE METABOLIC PANEL WITH GFR  Encounter for immunization -     Flu vaccine HIGH DOSE PF  History of diverticulitis  Nausea -     sucralfate (CARAFATE) 1 g tablet; Take 1 tablet (1 g total) by mouth 4 (four) times daily -  with meals and at bedtime. -     pantoprazole (PROTONIX) 40 MG tablet; Take 1 tablet (40 mg total) by mouth daily. -     promethazine (PHENERGAN)  25 MG tablet; Take 1 tablet (25 mg total) by mouth every 6 (six) hours as needed for nausea or vomiting. -     Lipase -     COMPLETE METABOLIC PANEL WITH GFR  Need for influenza vaccination -     Flu vaccine HIGH DOSE PF  .[name] Results for orders placed or performed in visit on 03/08/18  H. pylori breath test  Result Value Ref Range   H. pylori Breath Test NOT DETECTED NOT DETECT  POCT Urinalysis Dipstick  Result Value Ref Range   Color, UA yellow    Clarity, UA clear    Glucose, UA Negative Negative   Bilirubin, UA negative    Ketones, UA negative    Spec Grav, UA <=1.005 (A) 1.010 - 1.025   Blood, UA negative    pH, UA 6.0 5.0 - 8.0   Protein, UA Negative Negative   Urobilinogen, UA 0.2 0.2 or 1.0 E.U./dL   Nitrite, UA negative    Leukocytes, UA Negative Negative   Appearance     Odor      Unclear etiology.  Appears like gastritis;however  no reason for exacerbation. Since not on PPI or H2 blocker will get h.pylori breath test. GI cocktail given in office today. Start PPI and carafate. Let me know by Friday if any improvement. Bland diet. The pain is upper abdomen. She was tender throughout the entire abdomen. I would not expect this to be diverticulitis but she does have hx of this.  Concern for pancreatitis with hx of DM. Sugars have been controlled. UA in office was clean do not expect any UTI.  Somewhat concerned that could be some cardiac etiology masked by GI symptoms due to her hx. Will continue to monitor. No CP.   If no improvement need to get labs on Friday. She just finished prednisone CBD white count would be falsely elevated. Did not order. We could also treat empirically for diverticulitis due to hx and the fact she has had so much imaging.   [name].Spent 30 minutes with patient and greater than 50 percent of visit spent counseling patient regarding treatment plan.

## 2018-03-09 LAB — H. PYLORI BREATH TEST: H. pylori Breath Test: NOT DETECTED

## 2018-03-09 NOTE — Progress Notes (Signed)
Call pt: h.pylori negative. How are symptoms this morning?

## 2018-03-10 ENCOUNTER — Encounter: Payer: Self-pay | Admitting: Physician Assistant

## 2018-03-10 DIAGNOSIS — R1010 Upper abdominal pain, unspecified: Secondary | ICD-10-CM | POA: Insufficient documentation

## 2018-03-19 ENCOUNTER — Other Ambulatory Visit: Payer: Self-pay | Admitting: Physician Assistant

## 2018-03-19 DIAGNOSIS — F5101 Primary insomnia: Secondary | ICD-10-CM

## 2018-03-23 ENCOUNTER — Other Ambulatory Visit: Payer: Self-pay | Admitting: Physician Assistant

## 2018-03-23 DIAGNOSIS — F419 Anxiety disorder, unspecified: Secondary | ICD-10-CM

## 2018-03-30 ENCOUNTER — Other Ambulatory Visit: Payer: Self-pay | Admitting: Physician Assistant

## 2018-03-30 DIAGNOSIS — M5136 Other intervertebral disc degeneration, lumbar region: Secondary | ICD-10-CM

## 2018-04-03 ENCOUNTER — Ambulatory Visit (INDEPENDENT_AMBULATORY_CARE_PROVIDER_SITE_OTHER): Payer: Medicare Other | Admitting: Physician Assistant

## 2018-04-03 ENCOUNTER — Encounter: Payer: Self-pay | Admitting: Physician Assistant

## 2018-04-03 VITALS — BP 145/63 | HR 72 | Temp 98.7°F | Ht 62.75 in | Wt 135.0 lb

## 2018-04-03 DIAGNOSIS — E1169 Type 2 diabetes mellitus with other specified complication: Secondary | ICD-10-CM | POA: Diagnosis not present

## 2018-04-03 DIAGNOSIS — I2510 Atherosclerotic heart disease of native coronary artery without angina pectoris: Secondary | ICD-10-CM

## 2018-04-03 DIAGNOSIS — F419 Anxiety disorder, unspecified: Secondary | ICD-10-CM

## 2018-04-03 DIAGNOSIS — Z79899 Other long term (current) drug therapy: Secondary | ICD-10-CM

## 2018-04-03 DIAGNOSIS — K449 Diaphragmatic hernia without obstruction or gangrene: Secondary | ICD-10-CM

## 2018-04-03 DIAGNOSIS — E782 Mixed hyperlipidemia: Secondary | ICD-10-CM | POA: Diagnosis not present

## 2018-04-03 DIAGNOSIS — I2583 Coronary atherosclerosis due to lipid rich plaque: Secondary | ICD-10-CM | POA: Diagnosis not present

## 2018-04-03 DIAGNOSIS — R112 Nausea with vomiting, unspecified: Secondary | ICD-10-CM | POA: Diagnosis not present

## 2018-04-03 DIAGNOSIS — E559 Vitamin D deficiency, unspecified: Secondary | ICD-10-CM

## 2018-04-03 DIAGNOSIS — F5101 Primary insomnia: Secondary | ICD-10-CM

## 2018-04-03 DIAGNOSIS — J441 Chronic obstructive pulmonary disease with (acute) exacerbation: Secondary | ICD-10-CM

## 2018-04-03 MED ORDER — ALPRAZOLAM 1 MG PO TABS: 1.0000 mg | ORAL_TABLET | Freq: Every evening | ORAL | 5 refills | Status: DC | PRN

## 2018-04-03 MED ORDER — TRAZODONE HCL 50 MG PO TABS: 100.0000 mg | ORAL_TABLET | Freq: Every day | ORAL | 5 refills | Status: DC

## 2018-04-03 MED ORDER — PROMETHAZINE HCL 25 MG PO TABS: 25.0000 mg | ORAL_TABLET | Freq: Four times a day (QID) | ORAL | 0 refills | Status: DC | PRN

## 2018-04-03 MED ORDER — PREDNISONE 50 MG PO TABS: ORAL_TABLET | ORAL | 0 refills | Status: DC

## 2018-04-03 NOTE — Progress Notes (Signed)
Subjective:    Patient ID: [name], female    DOB: [birth year], 67 y.o.   MRN: [id number]  HPI Pt is a 67 yo female with COPD, hx of MI, HTN, T2DM, hypothyroidism, diverticulitis RA who presents to the clinic with 5-6 days of nausea and some vomiting.   She has a hiatal hernia per patient had EGD done with Dr. Rhetta Mura a few years ago. I can't find copy. No fever, chills, melena, hematochezia. Stools have been fairly solid. She does not really notice any correlation to food. She has some recent hx of abdominal pain/nausea and was added carafate to protonix. She has not been taking carafate but does report she "believed it helped last time she had this". She has a hx of cholecystectomy. Recent CT of abdomen done in June and unremarkable for any ongoing issues.   She has a productive cough for last few days that is worsening. She continues to smoke. She is taking mucinex with not much relief. She is having to use her rescue inhalers more and more.   Pt does request refills on xanax and trazodone. They are working well for her.   .. Active Ambulatory Problems    Diagnosis Date Noted  . DM (diabetes mellitus) (HCC) 06/05/2007  . Mixed hyperlipidemia 09/28/2007  . HYPERCALCEMIA 11/13/2009  . MDD (major depressive disorder), recurrent episode, mild (HCC) 09/28/2007  . FATTY LIVER DISEASE 09/28/2007  . ARTHRITIS, ACROMIOCLAVICULAR 07/31/2008  . SPINAL STENOSIS IN CERVICAL REGION 07/31/2008  . OSTEOPOROSIS 10/17/2007  . Elevated liver function tests 09/28/2007  . Allergic rhinitis 10/06/2010  . Chronic headache 04/03/2011  . Tobacco use 04/03/2011  . Chronic pain 04/03/2011  . Benign hypertension 04/03/2011  . Sacroiliitis (HCC) 09/14/2011  . Arthritis of carpometacarpal joint 02/14/2012  . Diabetes type 2, controlled (HCC) 03/14/2013  . Hypothyroidism 03/14/2013  . Trochanteric bursitis of right hip 05/04/2013  . Anxiety state 05/16/2013  . Ganglion cyst of wrist 07/13/2013  . Acute  pain of left shoulder 09/23/2013  . COLD (chronic obstructive lung disease) (HCC) 12/06/2013  . Primary osteoarthritis of left wrist 12/10/2014  . Hyperlipidemia 01/22/2015  . 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 Myocardial infarct (HCC) 03/09/2017  . CAD (coronary artery disease) 03/09/2017  . Rheumatoid arthritis involving both hands (HCC) 03/09/2017  . Rotator cuff tendonitis, left 03/10/2017  . Anxiety 03/12/2017  . Left upper arm swelling secondary to rotator cuff tear and fluid tracking down biceps tendon sheath 04/14/2017  . Common bile duct dilatation 04/27/2017  . Lumbar degenerative disc disease 05/13/2017  . Abnormal finding on GI tract imaging 05/25/2017  . Chronic back pain 02/27/2016  . Atherosclerosis of native coronary artery of native heart without angina pectoris 03/16/2016  . Bulging lumbar disc 02/27/2016  . RA (rheumatoid arthritis) (HCC) 07/06/2016  . Acquired hypothyroidism 12/01/2015  . Moderate episode of recurrent major depressive disorder (HCC) 07/24/2017  . Bilateral calf pain 07/24/2017  . Memory changes 07/24/2017  . Bilateral cataracts 09/07/2017  . Dry eye syndrome of bilateral lacrimal glands 09/07/2017  . Positive colorectal cancer screening using Cologuard test 10/07/2017  . Microalbuminuria 10/07/2017  . Weakness of both lower extremities 10/07/2017  . History of diverticulitis 11/21/2017  . Non-intractable vomiting with nausea 11/21/2017  . Pyelonephritis 11/21/2017  . Left lower quadrant guarding 11/21/2017  . Generalized abdominal pain 11/21/2017  . Vasovagal syncope 12/16/2017  . Upper abdominal pain 03/10/2018  . Vitamin D deficiency 04/05/2018  .  Primary insomnia 04/05/2018   Resolved Ambulatory Problems    Diagnosis Date Noted  . Chronic pain syndrome 06/05/2007  . HEMORRHOIDS, WITH BLEEDING 04/22/2008  . Acute maxillary sinusitis 04/10/2010  . Alcoholic fatty liver 12/24/2009  .  RENAL INSUFFICIENCY, ACUTE 10/23/2008  . ROTATOR CUFF SYNDROME, RIGHT 04/01/2008  . ALLERGIC REACTION 12/24/2009  . ELEVATED BLOOD PRESSURE WITHOUT DIAGNOSIS OF HYPERTENSION 05/13/2010  . CHRONIC OBSTRUCTIVE PULMONARY DISEASE, ACUTE EXACERBATION 08/14/2010  . Recurrent acute sinusitis 09/09/2010  . Allergic dermatitis due to poison ivy 11/27/2010  . DERMATITIS, ATOPIC 01/15/2011  . Ganglion cyst of left foot 07/13/2013  . Chronic pain syndrome 12/05/2013   Past Medical History:  Diagnosis Date  . Cervicalgia   . COPD (chronic obstructive pulmonary disease) (HCC)   . DDD (degenerative disc disease), lumbar   . Diabetes mellitus   . Fatty liver   . Narcotic abuse (HCC)   . Osteoarthritis   . Osteoporosis   . Rheumatoid arthritis (HCC)   . Thyroid disease      Review of Systems See HPI.     Objective:   Physical Exam  Constitutional: She is oriented to person, place, and time. She appears well-developed and well-nourished.  HENT:  Head: Normocephalic and atraumatic.  Right Ear: External ear normal.  Left Ear: External ear normal.  Cardiovascular: Normal rate and regular rhythm.  Pulmonary/Chest: Effort normal.  Coarse breath sounds.  Some diffuse rhonchi cleared with cough.   Abdominal: Soft. Bowel sounds are normal. She exhibits no distension and no mass. There is tenderness. There is no rebound.  Very mild diffuse tenderness in the LUQ and epigastric area.   Neurological: She is alert and oriented to person, place, and time.  Psychiatric: She has a normal mood and affect. Her behavior is normal.          Assessment & Plan:  Marland KitchenMarland KitchenDiagnoses and all orders for this visit:  Non-intractable vomiting with nausea -     promethazine (PHENERGAN) 25 MG tablet; Take 1 tablet (25 mg total) by mouth every 6 (six) hours as needed for nausea or vomiting.  Anxiety -     ALPRAZolam (XANAX) 1 MG tablet; Take 1 tablet (1 mg total) by mouth at bedtime as needed.  Primary  insomnia -     traZODone (DESYREL) 50 MG tablet; Take 2 tablets (100 mg total) by mouth at bedtime.  Mixed hyperlipidemia -     Lipid Panel w/reflex Direct LDL  Medication management -     COMPLETE METABOLIC PANEL WITH GFR  Type 2 diabetes mellitus with other specified complication, without long-term current use of insulin (HCC) -     Hemoglobin A1c  Vitamin D deficiency -     Vitamin D 1,25 dihydroxy  COPD exacerbation (HCC) -     predniSONE (DELTASONE) 50 MG tablet; Take one tablet for 5 days.   Unclear etiology of nausea. Could be from hiatal hernia or even some gastroparesis from ongoing DM. Possible to be viral but no other viral symptoms. Symptoms not consistent with diverticulitis. Discussed BRAT diet. Given some phenergan tablets for nausea. I would restart carafate to see if GERD could be causing some symptoms. Reassuring CT of abdomen in June. Follow up if not feeling better in the next 2-3 days or if symptoms worsening.   Ordered labs for preventative care.   Pt continues to smoke and not interested in quitting. Sounds like COPD exacerbation today. Prednisone for 5 days. mucinex to continue.   Refills given today.

## 2018-04-03 NOTE — Patient Instructions (Signed)
carafate   Hiatal Hernia A hiatal hernia occurs when part of the stomach slides above the muscle that separates the abdomen from the chest (diaphragm). A person can be born with a hiatal hernia (congenital), or it may develop over time. In almost all cases of hiatal hernia, only the top part of the stomach pushes through the diaphragm. Many people have a hiatal hernia with no symptoms. The larger the hernia, the more likely it is that you will have symptoms. In some cases, a hiatal hernia allows stomach acid to flow back into the tube that carries food from your mouth to your stomach (esophagus). This may cause heartburn symptoms. Severe heartburn symptoms may mean that you have developed a condition called gastroesophageal reflux disease (GERD). What are the causes? This condition is caused by a weakness in the opening (hiatus) where the esophagus passes through the diaphragm to attach to the upper part of the stomach. A person may be born with a weakness in the hiatus, or a weakness can develop over time. What increases the risk? This condition is more likely to develop in:  Older people. Age is a major risk factor for a hiatal hernia, especially if you are over the age of 22.  Pregnant women.  People who are overweight.  People who have frequent constipation.  What are the signs or symptoms? Symptoms of this condition usually develop in the form of GERD symptoms. Symptoms include:  Heartburn.  Belching.  Indigestion.  Trouble swallowing.  Coughing or wheezing.  Sore throat.  Hoarseness.  Chest pain.  Nausea and vomiting.  How is this diagnosed? This condition may be diagnosed during testing for GERD. Tests that may be done include:  X-rays of your stomach or chest.  An upper gastrointestinal (GI) series. This is an X-ray exam of your GI tract that is taken after you swallow a chalky liquid that shows up clearly on the X-ray.  Endoscopy. This is a procedure to look  into your stomach using a thin, flexible tube that has a tiny camera and light on the end of it.  How is this treated? This condition may be treated by:  Dietary and lifestyle changes to help reduce GERD symptoms.  Medicines. These may include: ? Over-the-counter antacids. ? Medicines that make your stomach empty more quickly. ? Medicines that block the production of stomach acid (H2 blockers). ? Stronger medicines to reduce stomach acid (proton pump inhibitors).  Surgery to repair the hernia, if other treatments are not helping.  If you have no symptoms, you may not need treatment. Follow these instructions at home: Lifestyle and activity  Do not use any products that contain nicotine or tobacco, such as cigarettes and e-cigarettes. If you need help quitting, ask your health care provider.  Try to achieve and maintain a healthy body weight.  Avoid putting pressure on your abdomen. Anything that puts pressure on your abdomen increases the amount of acid that may be pushed up into your esophagus. ? Avoid bending over, especially after eating. ? Raise the head of your bed by putting blocks under the legs. This keeps your head and esophagus higher than your stomach. ? Do not wear tight clothing around your chest or stomach. ? Try not to strain when having a bowel movement, when urinating, or when lifting heavy objects. Eating and drinking  Avoid foods that can worsen GERD symptoms. These may include: ? Fatty foods, like fried foods. ? Citrus fruits, like oranges or lemon. ? Other foods  and drinks that contain acid, like orange juice or tomatoes. ? Spicy food. ? Chocolate.  Eat frequent small meals instead of three large meals a day. This helps prevent your stomach from getting too full. ? Eat slowly. ? Do not lie down right after eating. ? Do not eat 1-2 hours before bed.  Do not drink beverages with caffeine. These include cola, coffee, cocoa, and tea.  Do not drink  alcohol. General instructions  Take over-the-counter and prescription medicines only as told by your health care provider.  Keep all follow-up visits as told by your health care provider. This is important. Contact a health care provider if:  Your symptoms are not controlled with medicines or lifestyle changes.  You are having trouble swallowing.  You have coughing or wheezing that will not go away. Get help right away if:  Your pain is getting worse.  Your pain spreads to your arms, neck, jaw, teeth, or back.  You have shortness of breath.  You sweat for no reason.  You feel sick to your stomach (nauseous) or you vomit.  You vomit blood.  You have bright red blood in your stools.  You have black, tarry stools. This information is not intended to replace advice given to you by your health care provider. Make sure you discuss any questions you have with your health care provider. Document Released: 08/07/2003 Document Revised: 05/10/2016 Document Reviewed: 05/10/2016 Elsevier Interactive Patient Education  [employer].

## 2018-04-05 ENCOUNTER — Telehealth: Payer: Self-pay | Admitting: Physician Assistant

## 2018-04-05 ENCOUNTER — Encounter: Payer: Self-pay | Admitting: Physician Assistant

## 2018-04-05 DIAGNOSIS — K449 Diaphragmatic hernia without obstruction or gangrene: Secondary | ICD-10-CM | POA: Insufficient documentation

## 2018-04-05 DIAGNOSIS — E559 Vitamin D deficiency, unspecified: Secondary | ICD-10-CM | POA: Insufficient documentation

## 2018-04-05 DIAGNOSIS — F5101 Primary insomnia: Secondary | ICD-10-CM | POA: Insufficient documentation

## 2018-04-05 NOTE — Telephone Encounter (Signed)
Fax placed in your paper basket

## 2018-04-05 NOTE — Telephone Encounter (Signed)
Thanks

## 2018-04-05 NOTE — Telephone Encounter (Signed)
Per pt EGD done with Dr. Rhetta Mura at [facility]. I cannot find it. It could be in [facility] but if not can we fax to get copy?

## 2018-04-05 NOTE — Telephone Encounter (Signed)
Spoke to Brimhall Nizhoni in medial records. She is going to fax over report

## 2018-04-07 ENCOUNTER — Other Ambulatory Visit: Payer: Self-pay | Admitting: Family Medicine

## 2018-04-07 DIAGNOSIS — E039 Hypothyroidism, unspecified: Secondary | ICD-10-CM

## 2018-04-21 ENCOUNTER — Ambulatory Visit (INDEPENDENT_AMBULATORY_CARE_PROVIDER_SITE_OTHER): Payer: Medicare Other | Admitting: Family Medicine

## 2018-04-21 ENCOUNTER — Ambulatory Visit (INDEPENDENT_AMBULATORY_CARE_PROVIDER_SITE_OTHER): Payer: Medicare Other

## 2018-04-21 ENCOUNTER — Encounter: Payer: Self-pay | Admitting: Family Medicine

## 2018-04-21 VITALS — BP 145/61 | HR 71 | Temp 98.1°F | Wt 132.0 lb

## 2018-04-21 DIAGNOSIS — I2510 Atherosclerotic heart disease of native coronary artery without angina pectoris: Secondary | ICD-10-CM | POA: Diagnosis not present

## 2018-04-21 DIAGNOSIS — W19XXXA Unspecified fall, initial encounter: Secondary | ICD-10-CM | POA: Diagnosis not present

## 2018-04-21 DIAGNOSIS — S82091A Other fracture of right patella, initial encounter for closed fracture: Secondary | ICD-10-CM

## 2018-04-21 DIAGNOSIS — I2583 Coronary atherosclerosis due to lipid rich plaque: Secondary | ICD-10-CM | POA: Diagnosis not present

## 2018-04-21 DIAGNOSIS — M9711XA Periprosthetic fracture around internal prosthetic right knee joint, initial encounter: Secondary | ICD-10-CM

## 2018-04-21 DIAGNOSIS — M25512 Pain in left shoulder: Secondary | ICD-10-CM | POA: Diagnosis not present

## 2018-04-21 DIAGNOSIS — M25561 Pain in right knee: Secondary | ICD-10-CM

## 2018-04-21 NOTE — Progress Notes (Signed)
[name] is a 67 y.o. female who presents to [facility] today for left shoulder pain and right knee pain.  [name] tripped and fell on November 5.  She notes mild continued anterior knee pain.  Pain is worse with activity.  She is able to ambulate and denies significant weakness.  She is status post total right knee replacement 2010 and is worried about injury to hardware.  She would like an x-ray if possible today.  Additionally she notes worsening left shoulder pain.  She had left shoulder glenohumeral injection January 2019 and it worked well to control her pain until relatively recently.  She denies injuring her shoulder in the fall.  She notes pain is worse with activity better with rest.  No radiating pain weakness or numbness.    ROS:  As above  Exam:  BP (!) 145/61   Pulse 71   Temp 98.1 F (36.7 C) (Oral)   Wt 132 lb (59.9 kg)   BMI 23.57 kg/m  General: Well Developed, well nourished, and in no acute distress.  Neuro/Psych: Alert and oriented x3, extra-ocular muscles intact, able to move all 4 extremities, sensation grossly intact. Skin: Warm and dry, no rashes noted.  Respiratory: Not using accessory muscles, speaking in full sentences, trachea midline.  Cardiovascular: Pulses palpable, no extremity edema. Abdomen: Does not appear distended. MSK: Right knee relatively normal-appearing with midline mature scar from total knee replacement Normal range of motion. Minimally tender patella.  Nontender patella tendon or quad tendon. Intact extension strength. Stable ligamentous exam.  Left shoulder normal-appearing nontender.  Decreased range of motion. Positive impingement testing. Intact strength.    Lab and Radiology Results X-ray images right knee personally independently reviewed. X-ray images show intact hardware.  Chronic appearing bipartite patella visible however no acute fracture visible. Await formal  radiology review.  Procedure: Real-time Ultrasound Guided Injection of left glenohumeral joint Device: GE Logiq E   Images permanently stored and available for review in the ultrasound unit. Verbal informed consent obtained.  Discussed risks and benefits of procedure. Warned about infection bleeding damage to structures skin hypopigmentation and fat atrophy among others. Patient expresses understanding and agreement Time-out conducted.   Noted no overlying erythema, induration, or other signs of local infection.   Skin prepped in a sterile fashion.   Local anesthesia: Topical Ethyl chloride.   With sterile technique and under real time ultrasound guidance:  40 mg of Kenalog and 2 mL of Marcaine injected easily.   Completed without difficulty   Pain immediately resolved suggesting accurate placement of the medication.   Advised to call if fevers/chills, erythema, induration, drainage, or persistent bleeding.   Images permanently stored and available for review in the ultrasound unit.  Impression: Technically successful ultrasound guided injection.         Assessment and Plan: 67 y.o. 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  She is done extremely well in the past with glenohumeral injection.  Plan to proceed with that today.  Recheck in the future as needed  Right knee pain and injury.  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  I do not see any obvious hardware loosening that appears to be acute.  She does have some changes that are very subtle and I am awaiting on formal radiology review.  She had good gait and had a relatively benign exam  therefore I do not think there is any acute bony injury.    Orders Placed This Encounter  Procedures  . DG Knee Complete 4 Views Right    Please include patellar sunrise, lateral, and weightbearing bilateral AP and bilateral  rosenberg views    Standing Status:   Future    Number of Occurrences:   1    Standing Expiration Date:   06/21/2019    Order Specific Question:   Reason for exam:    Answer:   Please include patellar sunrise, lateral, and weightbearing bilateral AP and bilateral rosenberg views    Comments:   Please include patellar sunrise, lateral, and weightbearing bilateral AP and bilateral rosenberg views    Order Specific Question:   Preferred imaging location?    Answer:   [facility]   No orders of the defined types were placed in this encounter.   Historical information moved to improve visibility of documentation.  Past Medical History:  Diagnosis Date  . CAD (coronary artery disease)   . Cervicalgia   . Chronic pain   . COPD (chronic obstructive pulmonary disease) (HCC)   . DDD (degenerative disc disease), lumbar   . Diabetes mellitus   . Fatty liver   . Hyperlipidemia   . Narcotic abuse (HCC)    history  . Osteoarthritis   . Osteoporosis   . Rheumatoid arthritis (HCC)   . Thyroid disease    hypothyroid   Past Surgical History:  Procedure Laterality Date  . BREAST SURGERY     braest lump/ benign  . CHOLECYSTECTOMY    . LUMBAR LAMINECTOMY  05/2010   Dr. Netta Corrigan  . TOTAL KNEE ARTHROPLASTY  2010   right   Social History   Tobacco Use  . Smoking status: Current Every Day Smoker    Packs/day: 1.00    Years: 43.00    Pack years: 43.00    Types: Cigarettes  . Smokeless tobacco: Never Used  . Tobacco comment: Has tried Chantix and Wellbutrin in the past  Substance Use Topics  . Alcohol use: Yes    Alcohol/week: 5.0 standard drinks    Types: 5 Standard drinks or equivalent per week    Comment: 1 beer per day   family history includes Cancer in her maternal grandmother; Cirrhosis in her sister; Diabetes in her father; Heart disease (age of onset: 41) in her father and mother; Hyperlipidemia in her mother; Lymphoma in her mother; Rheum arthritis in her father and  mother.  Medications: Current Outpatient Medications  Medication Sig Dispense Refill  . alendronate (FOSAMAX) 70 MG tablet TAKE ONE TABLET EVERY 7 DAYS WITH A FULL GLASS OF WATER ON AN EMPTY STOMACH 4 tablet 11  . ALPRAZolam (XANAX) 1 MG tablet Take 1 tablet (1 mg total) by mouth at bedtime as needed. 30 tablet 5  . AMBULATORY NON FORMULARY MEDICATION shingrix 2 dose series for shingles prevention. 1 application 0  . aspirin EC 81 MG tablet Take 81 mg by mouth daily.    [name] atorvastatin (LIPITOR) 80 MG tablet TAKE ONE TABLET BY MOUTH EVERY DAY 90 tablet 1  . cetirizine (ZYRTEC) 10 MG tablet Take 10 mg by mouth daily.    . Cholecalciferol (VITAMIN D3) 1000 units CAPS Take by mouth.    . desvenlafaxine (PRISTIQ) 100 MG 24 hr tablet Take 1 tablet (100 mg total) by mouth daily. 30 tablet 1  . gabapentin (NEURONTIN) 400 MG capsule Take 1 capsule (400 mg total) by mouth 2 (two) times  daily. 180 capsule 1  . gabapentin (NEURONTIN) 600 MG tablet TAKE ONE TABLET BY MOUTH TWICE DAILY 180 tablet 3  . levothyroxine (SYNTHROID, LEVOTHROID) 150 MCG tablet Take 1 tablet (150 mcg total) by mouth daily. 90 tablet 1  . lisinopril (PRINIVIL,ZESTRIL) 2.5 MG tablet Take 1 tablet (2.5 mg total) by mouth daily. 90 tablet 1  . metFORMIN (GLUCOPHAGE) 500 MG tablet TAKE 1 TABLET(500 MG) BY MOUTH TWICE DAILY WITH A MEAL 180 tablet 1  . methotrexate (RHEUMATREX) 15 MG tablet Take 1 tablet (15 mg total) by mouth once a week. Caution: Chemotherapy. Protect from light. 4 tablet 1  . metoprolol tartrate (LOPRESSOR) 25 MG tablet Take 0.5 tablets (12.5 mg total) by mouth 2 (two) times daily. 90 tablet 2  . nitroGLYCERIN (NITROSTAT) 0.4 MG SL tablet Place 0.4 mg under the tongue every 5 (five) minutes as needed for chest pain.    . pantoprazole (PROTONIX) 40 MG tablet Take 1 tablet (40 mg total) by mouth daily. 30 tablet 3  . predniSONE (DELTASONE) 50 MG tablet Take one tablet for 5 days. 5 tablet 0  . promethazine (PHENERGAN) 25  MG tablet Take 1 tablet (25 mg total) by mouth every 6 (six) hours as needed for nausea or vomiting. 30 tablet 0  . sucralfate (CARAFATE) 1 g tablet Take 1 tablet (1 g total) by mouth 4 (four) times daily -  with meals and at bedtime. 120 tablet 0  . traZODone (DESYREL) 50 MG tablet Take 2 tablets (100 mg total) by mouth at bedtime. 60 tablet 5  . triamcinolone cream (KENALOG) 0.1 % Apply 1 application topically 2 (two) times daily. 453.6 g 0   No current facility-administered medications for this visit.    Allergies  Allergen Reactions  . Codeine Palpitations and Other (See Comments)  . Naproxen Rash and Palpitations    GI upset  . Budesonide-Formoterol Fumarate Rash    REACTION: rash REACTION: rash REACTION: rash  . Duloxetine Hcl Other (See Comments)    No difference for pain or anti-depressant.  Other reaction(s): Other No difference for pain or anti-depressant. No difference for pain or anti-depressant.  . Fentanyl Other (See Comments)    REACTION: severe aggitation, mood changes Other reaction(s): Hallucination, Psychiatric Hallucinations Hallucinations REACTION: severe aggitation, mood changes   . Tramadol-Acetaminophen     Other reaction(s): Unknown  . Effexor Xr [Venlafaxine Hcl Er]     Felt more anxious or no difference.   . Fluticasone-Salmeterol   . Lisinopril Rash    Maybe we dont really know      Discussed warning signs or symptoms. Please see discharge instructions. Patient expresses understanding.

## 2018-04-21 NOTE — Patient Instructions (Addendum)
Thank you for coming in today.  Call or go to the ER if you develop a large red swollen joint with extreme pain or oozing puss.   Get xray knee know.  Let me know how the shoulder goes.

## 2018-04-24 ENCOUNTER — Encounter: Payer: Self-pay | Admitting: Family Medicine

## 2018-04-24 ENCOUNTER — Ambulatory Visit (INDEPENDENT_AMBULATORY_CARE_PROVIDER_SITE_OTHER): Payer: Medicare Other | Admitting: Family Medicine

## 2018-04-24 VITALS — BP 153/73 | HR 74 | Wt 135.0 lb

## 2018-04-24 DIAGNOSIS — I2510 Atherosclerotic heart disease of native coronary artery without angina pectoris: Secondary | ICD-10-CM | POA: Diagnosis not present

## 2018-04-24 DIAGNOSIS — I2583 Coronary atherosclerosis due to lipid rich plaque: Secondary | ICD-10-CM

## 2018-04-24 DIAGNOSIS — S82091A Other fracture of right patella, initial encounter for closed fracture: Secondary | ICD-10-CM | POA: Diagnosis not present

## 2018-04-24 MED ORDER — TRAMADOL HCL 50 MG PO TABS: 50.0000 mg | ORAL_TABLET | Freq: Four times a day (QID) | ORAL | 0 refills | Status: DC | PRN

## 2018-04-24 NOTE — Patient Instructions (Signed)
Thank you for coming in today.  Ok to use tramadol sparingly for pain.  Do not take with Suboxone.  Make sure your doctor knows.   Use the knee brace.  Keep the weight off the knee.   Return in 4 weeks.   Return sooner if needed.

## 2018-04-24 NOTE — Progress Notes (Signed)
[name] is a 67 y.o. female who presents to [facility] today for knee injury.  Patient was seen last week for knee injury.  X-ray after discharge showed a lateral bipartite patella versus age-indeterminate patellar fracture.  She had fallen about 3 weeks prior.  She notes anterior knee pain.  She is able to walk and bear weight but notes some pain.  She is been using a cane.  She feels well otherwise.  She notes Suboxone does help with the pain for her knee but notes it is not sufficient.  She is interested in a short course of tramadol for her knee pain and has not taken Suboxone in 2 days.  Of note she receives chronic pain management and Suboxone through Dr. Evelene Croon  Phone [phone] Fax [phone]  ROS:  As above  Exam:  BP (!) 153/73   Pulse 74   Wt 135 lb (61.2 kg)   BMI 24.11 kg/m  General: Well Developed, well nourished, and in no acute distress.  Neuro/Psych: Alert and oriented x3, extra-ocular muscles intact, able to move all 4 extremities, sensation grossly intact. Skin: Warm and dry, no rashes noted.  Respiratory: Not using accessory muscles, speaking in full sentences, trachea midline.  Cardiovascular: Pulses palpable, no extremity edema. Abdomen: Does not appear distended. MSK: Right knee relatively normal-appearing as well.  Midline scar with no effusion. Tender palpation anterior patella at the lateral aspect. Intact extension strength. Mild antalgic gait.    Lab and Radiology Results No results found for this or any previous visit (from the past 72 hour(s)). Dg Knee Complete 4 Views Right  Result Date: 04/21/2018 CLINICAL DATA:  Right knee pain following a fall earlier this month. EXAM: RIGHT KNEE - COMPLETE 4+ VIEW COMPARISON:  02/12/2009 and 07/14/2007. FINDINGS: Stable right total knee prosthesis. Interval fracture through the lateral aspect of the patella without displacement. No effusion seen. IMPRESSION:  1. Interval nondisplaced fracture through the lateral aspect of the patella without displacement. 2. Hardware intact. Electronically Signed   By: Beckie Salts M.D.   On: 04/21/2018 16:39    I personally (independently) visualized and performed the interpretation of the images attached in this note.    Assessment and Plan: 67 y.o. female with  Right knee pain with likely old patellar fracture.  Fracture does not look acute today unfortunately I do not have any recent images to compare to.  She is tender in this area therefore I do think it is clinically relevant.  Plan for hinged knee brace, walker for limited weightbearing and recheck in 2 to 4 weeks.  Limited tramadol for pain control as this has helped in the past.  Avoid Suboxone with tramadol.  Will notify Dr. Carie Caddy office.     No orders of the defined types were placed in this encounter.  Meds ordered this encounter  Medications  . traMADol (ULTRAM) 50 MG tablet    Sig: Take 1 tablet (50 mg total) by mouth every 6 (six) hours as needed for moderate pain.    Dispense:  12 tablet    Refill:  0    Historical information moved to improve visibility of documentation.  Past Medical History:  Diagnosis Date  . CAD (coronary artery disease)   . Cervicalgia   . Chronic pain   . COPD (chronic obstructive pulmonary disease) (HCC)   . DDD (degenerative disc disease), lumbar   . Diabetes mellitus   . Fatty liver   . Hyperlipidemia   .  Narcotic abuse (HCC)    history  . Osteoarthritis   . Osteoporosis   . Rheumatoid arthritis (HCC)   . Thyroid disease    hypothyroid   Past Surgical History:  Procedure Laterality Date  . BREAST SURGERY     braest lump/ benign  . CHOLECYSTECTOMY    . LUMBAR LAMINECTOMY  05/2010   Dr. Netta Corrigan  . TOTAL KNEE ARTHROPLASTY  2010   right   Social History   Tobacco Use  . Smoking status: Current Every Day Smoker    Packs/day: 1.00    Years: 43.00    Pack years: 43.00    Types: Cigarettes  .  Smokeless tobacco: Never Used  . Tobacco comment: Has tried Chantix and Wellbutrin in the past  Substance Use Topics  . Alcohol use: Yes    Alcohol/week: 5.0 standard drinks    Types: 5 Standard drinks or equivalent per week    Comment: 1 beer per day   family history includes Cancer in her maternal grandmother; Cirrhosis in her sister; Diabetes in her father; Heart disease (age of onset: 39) in her father and mother; Hyperlipidemia in her mother; Lymphoma in her mother; Rheum arthritis in her father and mother.  Medications: Current Outpatient Medications  Medication Sig Dispense Refill  . alendronate (FOSAMAX) 70 MG tablet TAKE ONE TABLET EVERY 7 DAYS WITH A FULL GLASS OF WATER ON AN EMPTY STOMACH 4 tablet 11  . ALPRAZolam (XANAX) 1 MG tablet Take 1 tablet (1 mg total) by mouth at bedtime as needed. 30 tablet 5  . AMBULATORY NON FORMULARY MEDICATION shingrix 2 dose series for shingles prevention. 1 application 0  . aspirin EC 81 MG tablet Take 81 mg by mouth daily.    [name] atorvastatin (LIPITOR) 80 MG tablet TAKE ONE TABLET BY MOUTH EVERY DAY 90 tablet 1  . cetirizine (ZYRTEC) 10 MG tablet Take 10 mg by mouth daily.    . Cholecalciferol (VITAMIN D3) 1000 units CAPS Take by mouth.    . desvenlafaxine (PRISTIQ) 100 MG 24 hr tablet Take 1 tablet (100 mg total) by mouth daily. 30 tablet 1  . gabapentin (NEURONTIN) 400 MG capsule Take 1 capsule (400 mg total) by mouth 2 (two) times daily. 180 capsule 1  . gabapentin (NEURONTIN) 600 MG tablet TAKE ONE TABLET BY MOUTH TWICE DAILY 180 tablet 3  . levothyroxine (SYNTHROID, LEVOTHROID) 150 MCG tablet Take 1 tablet (150 mcg total) by mouth daily. 90 tablet 1  . lisinopril (PRINIVIL,ZESTRIL) 2.5 MG tablet Take 1 tablet (2.5 mg total) by mouth daily. 90 tablet 1  . metFORMIN (GLUCOPHAGE) 500 MG tablet TAKE 1 TABLET(500 MG) BY MOUTH TWICE DAILY WITH A MEAL 180 tablet 1  . methotrexate (RHEUMATREX) 15 MG tablet Take 1 tablet (15 mg total) by mouth once a  week. Caution: Chemotherapy. Protect from light. 4 tablet 1  . metoprolol tartrate (LOPRESSOR) 25 MG tablet Take 0.5 tablets (12.5 mg total) by mouth 2 (two) times daily. 90 tablet 2  . nitroGLYCERIN (NITROSTAT) 0.4 MG SL tablet Place 0.4 mg under the tongue every 5 (five) minutes as needed for chest pain.    . pantoprazole (PROTONIX) 40 MG tablet Take 1 tablet (40 mg total) by mouth daily. 30 tablet 3  . predniSONE (DELTASONE) 50 MG tablet Take one tablet for 5 days. 5 tablet 0  . promethazine (PHENERGAN) 25 MG tablet Take 1 tablet (25 mg total) by mouth every 6 (six) hours as needed for nausea or vomiting. 30 tablet  0  . sucralfate (CARAFATE) 1 g tablet Take 1 tablet (1 g total) by mouth 4 (four) times daily -  with meals and at bedtime. 120 tablet 0  . traZODone (DESYREL) 50 MG tablet Take 2 tablets (100 mg total) by mouth at bedtime. 60 tablet 5  . triamcinolone cream (KENALOG) 0.1 % Apply 1 application topically 2 (two) times daily. 453.6 g 0  . traMADol (ULTRAM) 50 MG tablet Take 1 tablet (50 mg total) by mouth every 6 (six) hours as needed for moderate pain. 12 tablet 0   No current facility-administered medications for this visit.    Allergies  Allergen Reactions  . Codeine Palpitations and Other (See Comments)  . Naproxen Rash and Palpitations    GI upset  . Budesonide-Formoterol Fumarate Rash    REACTION: rash REACTION: rash REACTION: rash  . Duloxetine Hcl Other (See Comments)    No difference for pain or anti-depressant.  Other reaction(s): Other No difference for pain or anti-depressant. No difference for pain or anti-depressant.  . Fentanyl Other (See Comments)    REACTION: severe aggitation, mood changes Other reaction(s): Hallucination, Psychiatric Hallucinations Hallucinations REACTION: severe aggitation, mood changes   . Tramadol-Acetaminophen     Other reaction(s): Unknown  . Effexor Xr [Venlafaxine Hcl Er]     Felt more anxious or no difference.   .  Fluticasone-Salmeterol   . Lisinopril Rash    Maybe we dont really know      Discussed warning signs or symptoms. Please see discharge instructions. Patient expresses understanding.

## 2018-04-25 ENCOUNTER — Other Ambulatory Visit: Payer: Self-pay | Admitting: Physician Assistant

## 2018-04-27 ENCOUNTER — Other Ambulatory Visit: Payer: Self-pay | Admitting: Physician Assistant

## 2018-04-27 DIAGNOSIS — F331 Major depressive disorder, recurrent, moderate: Secondary | ICD-10-CM

## 2018-05-10 ENCOUNTER — Ambulatory Visit (INDEPENDENT_AMBULATORY_CARE_PROVIDER_SITE_OTHER): Payer: Medicare Other | Admitting: Family Medicine

## 2018-05-10 ENCOUNTER — Encounter: Payer: Self-pay | Admitting: Family Medicine

## 2018-05-10 VITALS — BP 155/76 | HR 75 | Wt 136.0 lb

## 2018-05-10 DIAGNOSIS — I2583 Coronary atherosclerosis due to lipid rich plaque: Secondary | ICD-10-CM

## 2018-05-10 DIAGNOSIS — S82091A Other fracture of right patella, initial encounter for closed fracture: Secondary | ICD-10-CM

## 2018-05-10 DIAGNOSIS — I2510 Atherosclerotic heart disease of native coronary artery without angina pectoris: Secondary | ICD-10-CM

## 2018-05-10 DIAGNOSIS — J019 Acute sinusitis, unspecified: Secondary | ICD-10-CM | POA: Diagnosis not present

## 2018-05-10 MED ORDER — CEFDINIR 300 MG PO CAPS: 300.0000 mg | ORAL_CAPSULE | Freq: Two times a day (BID) | ORAL | 0 refills | Status: DC

## 2018-05-10 NOTE — Patient Instructions (Signed)
Thank you for coming in today. Get xray today or soon.  Take omnicef for sinus pressure and pain.  Take over the counter tylenol for pain.  Cancel appointment for thr 19th and push it back about 3 weeks from now.    Sinusitis, Adult Sinusitis is soreness and inflammation of your sinuses. Sinuses are hollow spaces in the bones around your face. They are located:  Around your eyes.  In the middle of your forehead.  Behind your nose.  In your cheekbones.  Your sinuses and nasal passages are lined with a stringy fluid (mucus). Mucus normally drains out of your sinuses. When your nasal tissues get inflamed or swollen, the mucus can get trapped or blocked so air cannot flow through your sinuses. This lets bacteria, viruses, and funguses grow, and that leads to infection. Follow these instructions at home: Medicines  Take, use, or apply over-the-counter and prescription medicines only as told by your doctor. These may include nasal sprays.  If you were prescribed an antibiotic medicine, take it as told by your doctor. Do not stop taking the antibiotic even if you start to feel better. Hydrate and Humidify  Drink enough water to keep your pee (urine) clear or pale yellow.  Use a cool mist humidifier to keep the humidity level in your home above 50%.  Breathe in steam for 10-15 minutes, 3-4 times a day or as told by your doctor. You can do this in the bathroom while a hot shower is running.  Try not to spend time in cool or dry air. Rest  Rest as much as possible.  Sleep with your head raised (elevated).  Make sure to get enough sleep each night. General instructions  Put a warm, moist washcloth on your face 3-4 times a day or as told by your doctor. This will help with discomfort.  Wash your hands often with soap and water. If there is no soap and water, use hand sanitizer.  Do not smoke. Avoid being around people who are smoking (secondhand smoke).  Keep all follow-up visits  as told by your doctor. This is important. Contact a doctor if:  You have a fever.  Your symptoms get worse.  Your symptoms do not get better within 10 days. Get help right away if:  You have a very bad headache.  You cannot stop throwing up (vomiting).  You have pain or swelling around your face or eyes.  You have trouble seeing.  You feel confused.  Your neck is stiff.  You have trouble breathing. This information is not intended to replace advice given to you by your health care provider. Make sure you discuss any questions you have with your health care provider. Document Released: 11/03/2007 Document Revised: 01/11/2016 Document Reviewed: 03/12/2015 Elsevier Interactive Patient Education  [employer].

## 2018-05-11 NOTE — Progress Notes (Signed)
[name] is a 67 y.o. female who presents to [name]: Primary Care Sports Medicine today for sinus pain and pressure present starting yesterday.  Symptoms are consistent with prior sinus infection.  She notes mild headache as well.  No fevers chills nausea vomiting or diarrhea.  She denies any new or worsening shortness of breath or wheezing.  Additionally she suffered a lateral patellar fracture and was seen for this on November 25.  She notes that she is improving with knee brace and weightbearing as tolerated.  She is scheduled to follow-up next week.  He is feeling pretty happy with how things are going but notes that it is still tender and painful   ROS as above:  Exam:  BP (!) 155/76   Pulse 75   Wt 136 lb (61.7 kg)   BMI 24.28 kg/m  Wt Readings from Last 5 Encounters:  05/10/18 136 lb (61.7 kg)  04/24/18 135 lb (61.2 kg)  04/21/18 132 lb (59.9 kg)  04/03/18 135 lb (61.2 kg)  03/08/18 133 lb (60.3 kg)    Gen: Well NAD HEENT: EOMI,  MMM clear nasal discharge.  Inflamed nasal turbinates bilaterally.  Tender to palpation maxillary sinus.  Normal tympanic membranes.  Normal posterior pharynx.  Mild cervical lymphadenopathy. Lungs: Normal work of breathing. CTABL Heart: RRR no MRG Abd: NABS, Soft. Nondistended, Nontender Exts: Brisk capillary refill, warm and well perfused.  MSK: Right knee.  Relatively normal-appearing.  Tender to palpation anterior knee overlying patella.  Normal motion.  Mild antalgic gait.  Lab and Radiology Results X-ray of right knee pending   Assessment and Plan: 67 y.o. female with  Sinusitis.  Likely bacterial at this point.  Plan to treat with Omnicef.  Continue over-the-counter medications.  Recheck as needed.  Patellar fracture: Clinically improved.  X-ray pending.  Recheck in 2 to 3 weeks.  Return sooner if needed.   Orders Placed This  Encounter  Procedures  . DG Knee Complete 4 Views Right    Standing Status:   Future    Standing Expiration Date:   07/12/2019    Order Specific Question:   Reason for Exam (SYMPTOM  OR DIAGNOSIS REQUIRED)    Answer:   f/u fx    Order Specific Question:   Preferred imaging location?    Answer:   [name]    Order Specific Question:   Radiology Contrast Protocol - do NOT remove file path    Answer:   [email or web address]   Meds ordered this encounter  Medications  . cefdinir (OMNICEF) 300 MG capsule    Sig: Take 1 capsule (300 mg total) by mouth 2 (two) times daily.    Dispense:  14 capsule    Refill:  0     Historical information moved to improve visibility of documentation.  Past Medical History:  Diagnosis Date  . CAD (coronary artery disease)   . Cervicalgia   . Chronic pain   . COPD (chronic obstructive pulmonary disease) (HCC)   . DDD (degenerative disc disease), lumbar   . Diabetes mellitus   . Fatty liver   . Hyperlipidemia   . Narcotic abuse (HCC)    history  . Osteoarthritis   . Osteoporosis   . Rheumatoid arthritis (HCC)   . Thyroid disease    hypothyroid   Past Surgical History:  Procedure Laterality Date  . BREAST SURGERY     braest lump/ benign  . CHOLECYSTECTOMY    .  LUMBAR LAMINECTOMY  05/2010   Dr. Netta Corrigan  . TOTAL KNEE ARTHROPLASTY  2010   right   Social History   Tobacco Use  . Smoking status: Current Every Day Smoker    Packs/day: 1.00    Years: 43.00    Pack years: 43.00    Types: Cigarettes  . Smokeless tobacco: Never Used  . Tobacco comment: Has tried Chantix and Wellbutrin in the past  Substance Use Topics  . Alcohol use: Yes    Alcohol/week: 5.0 standard drinks    Types: 5 Standard drinks or equivalent per week    Comment: 1 beer per day   family history includes Cancer in her maternal grandmother; Cirrhosis in her sister; Diabetes in her father; Heart disease (age of onset:  13) in her father and mother; Hyperlipidemia in her mother; Lymphoma in her mother; Rheum arthritis in her father and mother.  Medications: Current Outpatient Medications  Medication Sig Dispense Refill  . alendronate (FOSAMAX) 70 MG tablet TAKE ONE TABLET EVERY 7 DAYS WITH A FULL GLASS OF WATER ON AN EMPTY STOMACH 4 tablet 11  . ALPRAZolam (XANAX) 1 MG tablet Take 1 tablet (1 mg total) by mouth at bedtime as needed. 30 tablet 5  . AMBULATORY NON FORMULARY MEDICATION shingrix 2 dose series for shingles prevention. 1 application 0  . aspirin EC 81 MG tablet Take 81 mg by mouth daily.    [name] atorvastatin (LIPITOR) 80 MG tablet TAKE ONE TABLET BY MOUTH EVERY DAY 90 tablet 1  . cetirizine (ZYRTEC) 10 MG tablet Take 10 mg by mouth daily.    . Cholecalciferol (VITAMIN D3) 1000 units CAPS Take by mouth.    . desvenlafaxine (PRISTIQ) 100 MG 24 hr tablet Take 1 tablet (100 mg total) by mouth daily. 90 tablet 1  . gabapentin (NEURONTIN) 400 MG capsule Take 1 capsule (400 mg total) by mouth 2 (two) times daily. 180 capsule 1  . gabapentin (NEURONTIN) 600 MG tablet TAKE ONE TABLET BY MOUTH TWICE DAILY 180 tablet 3  . levothyroxine (SYNTHROID, LEVOTHROID) 150 MCG tablet Take 1 tablet (150 mcg total) by mouth daily. 90 tablet 1  . lisinopril (PRINIVIL,ZESTRIL) 2.5 MG tablet Take 1 tablet (2.5 mg total) by mouth daily. 90 tablet 1  . metFORMIN (GLUCOPHAGE) 500 MG tablet TAKE 1 TABLET(500 MG) BY MOUTH TWICE DAILY WITH A MEAL 180 tablet 1  . methotrexate (RHEUMATREX) 15 MG tablet Take 1 tablet (15 mg total) by mouth once a week. Caution: Chemotherapy. Protect from light. 4 tablet 1  . metoprolol tartrate (LOPRESSOR) 25 MG tablet Take 0.5 tablets (12.5 mg total) by mouth 2 (two) times daily. 90 tablet 2  . nitroGLYCERIN (NITROSTAT) 0.4 MG SL tablet Place 0.4 mg under the tongue every 5 (five) minutes as needed for chest pain.    . pantoprazole (PROTONIX) 40 MG tablet Take 1 tablet (40 mg total) by mouth daily. 30  tablet 3  . predniSONE (DELTASONE) 50 MG tablet Take one tablet for 5 days. 5 tablet 0  . promethazine (PHENERGAN) 25 MG tablet Take 1 tablet (25 mg total) by mouth every 6 (six) hours as needed for nausea or vomiting. 30 tablet 0  . sucralfate (CARAFATE) 1 g tablet Take 1 tablet (1 g total) by mouth 4 (four) times daily -  with meals and at bedtime. 120 tablet 0  . traMADol (ULTRAM) 50 MG tablet Take 1 tablet (50 mg total) by mouth every 6 (six) hours as needed for moderate pain. 12 tablet  0  . traZODone (DESYREL) 50 MG tablet Take 2 tablets (100 mg total) by mouth at bedtime. 60 tablet 5  . triamcinolone cream (KENALOG) 0.1 % Apply 1 application topically 2 (two) times daily. 453.6 g 0  . cefdinir (OMNICEF) 300 MG capsule Take 1 capsule (300 mg total) by mouth 2 (two) times daily. 14 capsule 0   No current facility-administered medications for this visit.    Allergies  Allergen Reactions  . Codeine Palpitations and Other (See Comments)  . Naproxen Rash and Palpitations    GI upset  . Budesonide-Formoterol Fumarate Rash    REACTION: rash REACTION: rash REACTION: rash  . Duloxetine Hcl Other (See Comments)    No difference for pain or anti-depressant.  Other reaction(s): Other No difference for pain or anti-depressant. No difference for pain or anti-depressant.  . Fentanyl Other (See Comments)    REACTION: severe aggitation, mood changes Other reaction(s): Hallucination, Psychiatric Hallucinations Hallucinations REACTION: severe aggitation, mood changes   . Tramadol-Acetaminophen     Other reaction(s): Unknown  . Effexor Xr [Venlafaxine Hcl Er]     Felt more anxious or no difference.   . Fluticasone-Salmeterol   . Lisinopril Rash    Maybe we dont really know     Discussed warning signs or symptoms. Please see discharge instructions. Patient expresses understanding.

## 2018-05-12 ENCOUNTER — Telehealth: Payer: Self-pay

## 2018-05-12 NOTE — Telephone Encounter (Signed)
[name] called and states she has stopped the cefdinir due to diarrhea. She would like a Zpak sent in. I advised patient Dr Denyse Amass is gone for the day. I advised her if to go to the urgent care if her symptoms worsen.

## 2018-05-15 MED ORDER — AZITHROMYCIN 250 MG PO TABS: 250.0000 mg | ORAL_TABLET | Freq: Every day | ORAL | 0 refills | Status: DC

## 2018-05-15 NOTE — Telephone Encounter (Signed)
Azithromycin sent to pharmacy

## 2018-05-15 NOTE — Telephone Encounter (Signed)
Patient advised.

## 2018-05-18 ENCOUNTER — Ambulatory Visit: Payer: Medicare Other | Admitting: Family Medicine

## 2018-05-19 ENCOUNTER — Ambulatory Visit (INDEPENDENT_AMBULATORY_CARE_PROVIDER_SITE_OTHER): Payer: Medicare Other

## 2018-05-19 DIAGNOSIS — S82091A Other fracture of right patella, initial encounter for closed fracture: Secondary | ICD-10-CM

## 2018-05-19 DIAGNOSIS — Z96651 Presence of right artificial knee joint: Secondary | ICD-10-CM | POA: Diagnosis not present

## 2018-05-19 DIAGNOSIS — W19XXXD Unspecified fall, subsequent encounter: Secondary | ICD-10-CM | POA: Diagnosis not present

## 2018-05-19 DIAGNOSIS — E782 Mixed hyperlipidemia: Secondary | ICD-10-CM | POA: Diagnosis not present

## 2018-05-19 DIAGNOSIS — Z79899 Other long term (current) drug therapy: Secondary | ICD-10-CM | POA: Diagnosis not present

## 2018-05-19 DIAGNOSIS — E1169 Type 2 diabetes mellitus with other specified complication: Secondary | ICD-10-CM | POA: Diagnosis not present

## 2018-05-19 DIAGNOSIS — E559 Vitamin D deficiency, unspecified: Secondary | ICD-10-CM | POA: Diagnosis not present

## 2018-05-19 DIAGNOSIS — S82091D Other fracture of right patella, subsequent encounter for closed fracture with routine healing: Secondary | ICD-10-CM | POA: Diagnosis not present

## 2018-05-19 DIAGNOSIS — Z471 Aftercare following joint replacement surgery: Secondary | ICD-10-CM | POA: Diagnosis not present

## 2018-05-21 NOTE — Progress Notes (Signed)
Call pt: cholesterol looks great. A!C back up to 7.0. make sure watching sugars and carbs. Liver enzymes are back up some. They were down. Any new recent medications started in the last year.

## 2018-05-22 ENCOUNTER — Other Ambulatory Visit: Payer: Self-pay | Admitting: Physician Assistant

## 2018-05-22 LAB — COMPLETE METABOLIC PANEL WITH GFR
AG RATIO: 1.5 (calc) (ref 1.0–2.5)
ALKALINE PHOSPHATASE (APISO): 184 U/L — AB (ref 33–130)
ALT: 59 U/L — AB (ref 6–29)
AST: 35 U/L (ref 10–35)
Albumin: 4.4 g/dL (ref 3.6–5.1)
BILIRUBIN TOTAL: 0.5 mg/dL (ref 0.2–1.2)
BUN: 15 mg/dL (ref 7–25)
CHLORIDE: 101 mmol/L (ref 98–110)
CO2: 30 mmol/L (ref 20–32)
Calcium: 10.4 mg/dL (ref 8.6–10.4)
Creat: 0.91 mg/dL (ref 0.50–0.99)
GFR, EST NON AFRICAN AMERICAN: 65 mL/min/{1.73_m2} (ref >=60)
GFR, Est African American: 75 mL/min/{1.73_m2} (ref >=60)
GLOBULIN: 3 g/dL (ref 1.9–3.7)
Glucose, Bld: 120 mg/dL — ABNORMAL HIGH (ref 65–99)
POTASSIUM: 4.6 mmol/L (ref 3.5–5.3)
SODIUM: 138 mmol/L (ref 135–146)
Total Protein: 7.4 g/dL (ref 6.1–8.1)

## 2018-05-22 LAB — VITAMIN D 1,25 DIHYDROXY
Vitamin D 1, 25 (OH)2 Total: 16 pg/mL — ABNORMAL LOW (ref 18–72)
Vitamin D2 1, 25 (OH)2: <8 pg/mL
Vitamin D3 1, 25 (OH)2: 16 pg/mL

## 2018-05-22 LAB — HEMOGLOBIN A1C
HEMOGLOBIN A1C: 7 %{Hb} — AB (ref <5.7)
MEAN PLASMA GLUCOSE: 154 (calc)
eAG (mmol/L): 8.5 (calc)

## 2018-05-22 LAB — LIPID PANEL W/REFLEX DIRECT LDL
CHOL/HDL RATIO: 2 (calc) (ref <5.0)
CHOLESTEROL: 174 mg/dL (ref <200)
HDL: 88 mg/dL (ref >50)
LDL CHOLESTEROL (CALC): 64 mg/dL
NON-HDL CHOLESTEROL (CALC): 86 mg/dL (ref <130)
TRIGLYCERIDES: 134 mg/dL (ref <150)

## 2018-05-22 MED ORDER — VITAMIN D (ERGOCALCIFEROL) 1.25 MG (50000 UNIT) PO CAPS: 50000.0000 [IU] | ORAL_CAPSULE | ORAL | 1 refills | Status: DC

## 2018-05-22 NOTE — Progress Notes (Signed)
Left message for return phone call

## 2018-05-22 NOTE — Progress Notes (Signed)
Vitamin D is still very low. Need high dose and recheck in 3 months. Take with protein or meal.

## 2018-05-22 NOTE — Progress Notes (Signed)
Dr. Denyse Amass out of office who ordered xray.  No significant changes seen. Likely Dr. Denyse Amass will also personally review and comment accordingly when back in office.

## 2018-05-27 ENCOUNTER — Other Ambulatory Visit: Payer: Self-pay | Admitting: Physician Assistant

## 2018-05-27 DIAGNOSIS — R1010 Upper abdominal pain, unspecified: Secondary | ICD-10-CM

## 2018-05-27 DIAGNOSIS — R110 Nausea: Secondary | ICD-10-CM

## 2018-05-30 ENCOUNTER — Ambulatory Visit (INDEPENDENT_AMBULATORY_CARE_PROVIDER_SITE_OTHER): Payer: Medicare Other | Admitting: Family Medicine

## 2018-05-30 ENCOUNTER — Encounter: Payer: Self-pay | Admitting: Family Medicine

## 2018-05-30 VITALS — BP 138/65 | HR 96 | Wt 136.0 lb

## 2018-05-30 DIAGNOSIS — I2510 Atherosclerotic heart disease of native coronary artery without angina pectoris: Secondary | ICD-10-CM

## 2018-05-30 DIAGNOSIS — I2583 Coronary atherosclerosis due to lipid rich plaque: Secondary | ICD-10-CM

## 2018-05-30 DIAGNOSIS — S82091A Other fracture of right patella, initial encounter for closed fracture: Secondary | ICD-10-CM | POA: Diagnosis not present

## 2018-05-30 DIAGNOSIS — R059 Cough, unspecified: Secondary | ICD-10-CM

## 2018-05-30 DIAGNOSIS — R05 Cough: Secondary | ICD-10-CM | POA: Diagnosis not present

## 2018-05-30 DIAGNOSIS — J410 Simple chronic bronchitis: Secondary | ICD-10-CM | POA: Diagnosis not present

## 2018-05-30 MED ORDER — BENZONATATE 200 MG PO CAPS: 200.0000 mg | ORAL_CAPSULE | Freq: Three times a day (TID) | ORAL | 3 refills | Status: DC | PRN

## 2018-05-30 MED ORDER — FLUTICASONE-UMECLIDIN-VILANT 100-62.5-25 MCG/INH IN AEPB: 1.0000 | INHALATION_SPRAY | Freq: Every day | RESPIRATORY_TRACT | 12 refills | Status: DC

## 2018-05-30 MED ORDER — PREDNISONE 50 MG PO TABS: 50.0000 mg | ORAL_TABLET | Freq: Every day | ORAL | 0 refills | Status: DC

## 2018-05-30 MED ORDER — DOXYCYCLINE HYCLATE 100 MG PO TABS: 100.0000 mg | ORAL_TABLET | Freq: Two times a day (BID) | ORAL | 0 refills | Status: DC

## 2018-05-30 MED ORDER — ALBUTEROL SULFATE HFA 108 (90 BASE) MCG/ACT IN AERS: 2.0000 | INHALATION_SPRAY | Freq: Four times a day (QID) | RESPIRATORY_TRACT | 1 refills | Status: DC | PRN

## 2018-05-30 NOTE — Progress Notes (Signed)
[name] is a 67 y.o. female who presents to [name]: Primary Care Sports Medicine today for  right patellar fracture.  Patient was seen on originally November 22 for right knee injury.  X-rays at the end of the visit showed a nondisplaced fracture through the lateral aspect of the patella.  She was treated conservatively and on recheck x-ray on December 20 the x-ray showed some healing and no significant change in displacement of fracture.  Clinically she notes significant improvement in knee pain.  She is able to walk more normally without significant pain.  Her biggest issue today is cough. Cough: Raidyn has a for 5-day history of worsening cough wheezing or shortness of breath.  She has been treated several times in the last 2 months for sinusitis or bronchitis with steroids and albuterol and antibiotics.  She most recently completed a course of azithromycin which did help her sinus infection symptoms.  She notes wheezing coughing and shortness of breath.  This is been worse for about 5 days.  Symptoms are consistent with COPD exacerbation.    ROS as above:  Exam:  BP 138/65   Pulse 96   Wt 136 lb (61.7 kg)   BMI 24.28 kg/m  Wt Readings from Last 5 Encounters:  05/30/18 136 lb (61.7 kg)  05/10/18 136 lb (61.7 kg)  04/24/18 135 lb (61.2 kg)  04/21/18 132 lb (59.9 kg)  04/03/18 135 lb (61.2 kg)    Gen: Well NAD HEENT: EOMI,  MMM Lungs: Normal work of breathing.  Prolonged breath phase wheezing coarse breath sounds present Heart: RRR no MRG Abd: NABS, Soft. Nondistended, Nontender Exts: Brisk capillary refill, warm and well perfused.  Right knee nontender normal motion.  Patient was given a 2.5/0.5 mg DuoNeb nebulizer treatment and felt better  Lab and Radiology Results EXAM: RIGHT KNEE - COMPLETE 4+ VIEW  COMPARISON:  04/21/2018  FINDINGS: Ossific opacities lateral aspect  of the patella without interval change in alignment. Status post right knee replacement with intact hardware. No significant knee effusion.  IMPRESSION: No significant interval change in alignment of ossific opacities along the lateral aspect of the patella. Prior right knee replacement with intact hardware and normal alignment.   Electronically Signed   By: Jasmine Pang M.D.   On: 05/19/2018 14:29 I personally (independently) visualized and performed the interpretation of the images attached in this note.    Assessment and Plan: 67 y.o. female with  Right knee patellar fracture clinically much improved.  Advance activity as tolerated and recheck in 1 month.  Bronchitis/COPD exacerbation.  Prescribed prednisone, Tessalon Perles, albuterol.  Additionally his doxycycline antibiotics.  Will also prescribe and start Trelegy.  Recheck with PCP in the near future.  Patient may benefit from pulmonology referral in the future.   No orders of the defined types were placed in this encounter.  Meds ordered this encounter  Medications  . albuterol (PROVENTIL HFA;VENTOLIN HFA) 108 (90 Base) MCG/ACT inhaler    Sig: Inhale 2 puffs into the lungs every 6 (six) hours as needed for wheezing or shortness of breath.    Dispense:  1 Inhaler    Refill:  1  . predniSONE (DELTASONE) 50 MG tablet    Sig: Take 1 tablet (50 mg total) by mouth daily.    Dispense:  5 tablet    Refill:  0  . benzonatate (TESSALON) 200 MG capsule    Sig: Take 1 capsule (200 mg total)  by mouth 3 (three) times daily as needed for cough.    Dispense:  45 capsule    Refill:  3  . Fluticasone-Umeclidin-Vilant (TRELEGY ELLIPTA) 100-62.5-25 MCG/INH AEPB    Sig: Inhale 1 puff into the lungs daily.    Dispense:  30 each    Refill:  12  . doxycycline (VIBRA-TABS) 100 MG tablet    Sig: Take 1 tablet (100 mg total) by mouth 2 (two) times daily.    Dispense:  14 tablet    Refill:  0     Historical information moved to  improve visibility of documentation.  Past Medical History:  Diagnosis Date  . CAD (coronary artery disease)   . Cervicalgia   . Chronic pain   . COPD (chronic obstructive pulmonary disease) (HCC)   . DDD (degenerative disc disease), lumbar   . Diabetes mellitus   . Fatty liver   . Hyperlipidemia   . Narcotic abuse (HCC)    history  . Osteoarthritis   . Osteoporosis   . Rheumatoid arthritis (HCC)   . Thyroid disease    hypothyroid   Past Surgical History:  Procedure Laterality Date  . BREAST SURGERY     braest lump/ benign  . CHOLECYSTECTOMY    . LUMBAR LAMINECTOMY  05/2010   Dr. Netta Corrigan  . TOTAL KNEE ARTHROPLASTY  2010   right   Social History   Tobacco Use  . Smoking status: Current Every Day Smoker    Packs/day: 1.00    Years: 43.00    Pack years: 43.00    Types: Cigarettes  . Smokeless tobacco: Never Used  . Tobacco comment: Has tried Chantix and Wellbutrin in the past  Substance Use Topics  . Alcohol use: Yes    Alcohol/week: 5.0 standard drinks    Types: 5 Standard drinks or equivalent per week    Comment: 1 beer per day   family history includes Cancer in her maternal grandmother; Cirrhosis in her sister; Diabetes in her father; Heart disease (age of onset: 32) in her father and mother; Hyperlipidemia in her mother; Lymphoma in her mother; Rheum arthritis in her father and mother.  Medications: Current Outpatient Medications  Medication Sig Dispense Refill  . alendronate (FOSAMAX) 70 MG tablet TAKE ONE TABLET EVERY 7 DAYS WITH A FULL GLASS OF WATER ON AN EMPTY STOMACH 4 tablet 11  . ALPRAZolam (XANAX) 1 MG tablet Take 1 tablet (1 mg total) by mouth at bedtime as needed. 30 tablet 5  . AMBULATORY NON FORMULARY MEDICATION shingrix 2 dose series for shingles prevention. 1 application 0  . aspirin EC 81 MG tablet Take 81 mg by mouth daily.    [name] atorvastatin (LIPITOR) 80 MG tablet TAKE ONE TABLET BY MOUTH EVERY DAY 90 tablet 1  . cetirizine (ZYRTEC) 10 MG  tablet Take 10 mg by mouth daily.    . Cholecalciferol (VITAMIN D3) 1000 units CAPS Take by mouth.    . desvenlafaxine (PRISTIQ) 100 MG 24 hr tablet Take 1 tablet (100 mg total) by mouth daily. 90 tablet 1  . gabapentin (NEURONTIN) 400 MG capsule Take 1 capsule (400 mg total) by mouth 2 (two) times daily. 180 capsule 1  . gabapentin (NEURONTIN) 600 MG tablet TAKE ONE TABLET BY MOUTH TWICE DAILY 180 tablet 3  . levothyroxine (SYNTHROID, LEVOTHROID) 150 MCG tablet Take 1 tablet (150 mcg total) by mouth daily. 90 tablet 1  . lisinopril (PRINIVIL,ZESTRIL) 2.5 MG tablet Take 1 tablet (2.5 mg total) by mouth daily. 90  tablet 1  . metFORMIN (GLUCOPHAGE) 500 MG tablet TAKE 1 TABLET(500 MG) BY MOUTH TWICE DAILY WITH A MEAL 180 tablet 1  . methotrexate (RHEUMATREX) 15 MG tablet Take 1 tablet (15 mg total) by mouth once a week. Caution: Chemotherapy. Protect from light. 4 tablet 1  . metoprolol tartrate (LOPRESSOR) 25 MG tablet Take 0.5 tablets (12.5 mg total) by mouth 2 (two) times daily. 90 tablet 2  . nitroGLYCERIN (NITROSTAT) 0.4 MG SL tablet Place 0.4 mg under the tongue every 5 (five) minutes as needed for chest pain.    . pantoprazole (PROTONIX) 40 MG tablet Take 1 tablet (40 mg total) by mouth daily. 90 tablet 1  . promethazine (PHENERGAN) 25 MG tablet Take 1 tablet (25 mg total) by mouth every 6 (six) hours as needed for nausea or vomiting. 30 tablet 0  . sucralfate (CARAFATE) 1 g tablet Take 1 tablet (1 g total) by mouth 4 (four) times daily -  with meals and at bedtime. 120 tablet 0  . traMADol (ULTRAM) 50 MG tablet Take 1 tablet (50 mg total) by mouth every 6 (six) hours as needed for moderate pain. 12 tablet 0  . traZODone (DESYREL) 50 MG tablet Take 2 tablets (100 mg total) by mouth at bedtime. 60 tablet 5  . triamcinolone cream (KENALOG) 0.1 % Apply 1 application topically 2 (two) times daily. 453.6 g 0  . Vitamin D, Ergocalciferol, (DRISDOL) 1.25 MG (50000 UT) CAPS capsule Take 1 capsule  (50,000 Units total) by mouth every 7 (seven) days. 12 capsule 1  . albuterol (PROVENTIL HFA;VENTOLIN HFA) 108 (90 Base) MCG/ACT inhaler Inhale 2 puffs into the lungs every 6 (six) hours as needed for wheezing or shortness of breath. 1 Inhaler 1  . benzonatate (TESSALON) 200 MG capsule Take 1 capsule (200 mg total) by mouth 3 (three) times daily as needed for cough. 45 capsule 3  . doxycycline (VIBRA-TABS) 100 MG tablet Take 1 tablet (100 mg total) by mouth 2 (two) times daily. 14 tablet 0  . Fluticasone-Umeclidin-Vilant (TRELEGY ELLIPTA) 100-62.5-25 MCG/INH AEPB Inhale 1 puff into the lungs daily. 30 each 12  . predniSONE (DELTASONE) 50 MG tablet Take 1 tablet (50 mg total) by mouth daily. 5 tablet 0   No current facility-administered medications for this visit.    Allergies  Allergen Reactions  . Codeine Palpitations and Other (See Comments)  . Naproxen Rash and Palpitations    GI upset  . Budesonide-Formoterol Fumarate Rash    REACTION: rash REACTION: rash REACTION: rash  . Duloxetine Hcl Other (See Comments)    No difference for pain or anti-depressant.  Other reaction(s): Other No difference for pain or anti-depressant. No difference for pain or anti-depressant.  . Fentanyl Other (See Comments)    REACTION: severe aggitation, mood changes Other reaction(s): Hallucination, Psychiatric Hallucinations Hallucinations REACTION: severe aggitation, mood changes   . Tramadol-Acetaminophen     Other reaction(s): Unknown  . Effexor Xr [Venlafaxine Hcl Er]     Felt more anxious or no difference.   . Fluticasone-Salmeterol   . Lisinopril Rash    Maybe we dont really know     Discussed warning signs or symptoms. Please see discharge instructions. Patient expresses understanding.

## 2018-05-30 NOTE — Patient Instructions (Addendum)
Thank you for coming in today. Call or go to the emergency room if you get worse, have trouble breathing, have chest pains, or palpitations.  Use the albuterol as needed.  Take prednisone for 5 days.  Start Child psychotherapist.  Take doxycycline antibiotic for 7 days.  Recheck with Jade soon.  Recheck with me in 1 month for knee.  Return sooner if needed.  Call or go to the emergency room if you get worse, have trouble breathing, have chest pains, or palpitations.    Acute Bronchitis, Adult Acute bronchitis is when air tubes (bronchi) in the lungs suddenly get swollen. The condition can make it hard to breathe. It can also cause these symptoms:  A cough.  Coughing up clear, yellow, or green mucus.  Wheezing.  Chest congestion.  Shortness of breath.  A fever.  Body aches.  Chills.  A sore throat. Follow these instructions at home:  Medicines  Take over-the-counter and prescription medicines only as told by your doctor.  If you were prescribed an antibiotic medicine, take it as told by your doctor. Do not stop taking the antibiotic even if you start to feel better. General instructions  Rest.  Drink enough fluids to keep your pee (urine) pale yellow.  Avoid smoking and secondhand smoke. If you smoke and you need help quitting, ask your doctor. Quitting will help your lungs heal faster.  Use an inhaler, cool mist vaporizer, or humidifier as told by your doctor.  Keep all follow-up visits as told by your doctor. This is important. How is this prevented? To lower your risk of getting this condition again:  Wash your hands often with soap and water. If you cannot use soap and water, use hand sanitizer.  Avoid contact with people who have cold symptoms.  Try not to touch your hands to your mouth, nose, or eyes.  Make sure to get the flu shot every year. Contact a doctor if:  Your symptoms do not get better in 2 weeks. Get help right away if:  You cough up  blood.  You have chest pain.  You have very bad shortness of breath.  You become dehydrated.  You faint (pass out) or keep feeling like you are going to pass out.  You keep throwing up (vomiting).  You have a very bad headache.  Your fever or chills gets worse. This information is not intended to replace advice given to you by your health care provider. Make sure you discuss any questions you have with your health care provider. Document Released: 11/03/2007 Document Revised: 12/29/2016 Document Reviewed: 11/05/2015 Elsevier Interactive Patient Education  2019 [employer].

## 2018-06-08 ENCOUNTER — Telehealth: Payer: Self-pay | Admitting: Family Medicine

## 2018-06-08 MED ORDER — ALBUTEROL SULFATE HFA 108 (90 BASE) MCG/ACT IN AERS: 2.0000 | INHALATION_SPRAY | Freq: Four times a day (QID) | RESPIRATORY_TRACT | 1 refills | Status: DC | PRN

## 2018-06-08 NOTE — Telephone Encounter (Signed)
Left VM with update.  

## 2018-06-08 NOTE — Telephone Encounter (Signed)
Received letter from [employer] regarding her albuterol prescription.  Albuterol is not the preferred brand of albuterol according to the insurance.  Ventolin is.  New prescription sent for Ventolin HFA specifically to [facility] pharmacy.  Her insurance did allow a 30-day supply of albuterol for the original albuterol prescription that I wrote.  New albuterol prescription sent.

## 2018-06-19 ENCOUNTER — Other Ambulatory Visit: Payer: Self-pay | Admitting: Family Medicine

## 2018-06-19 DIAGNOSIS — E039 Hypothyroidism, unspecified: Secondary | ICD-10-CM

## 2018-06-23 ENCOUNTER — Ambulatory Visit: Payer: Medicare Other | Admitting: Physician Assistant

## 2018-07-03 ENCOUNTER — Ambulatory Visit: Payer: Medicare Other | Admitting: Physician Assistant

## 2018-07-05 ENCOUNTER — Ambulatory Visit: Payer: Medicare Other | Admitting: Physician Assistant

## 2018-07-10 ENCOUNTER — Encounter: Payer: Self-pay | Admitting: Physician Assistant

## 2018-07-10 ENCOUNTER — Ambulatory Visit (INDEPENDENT_AMBULATORY_CARE_PROVIDER_SITE_OTHER): Payer: Medicare Other | Admitting: Physician Assistant

## 2018-07-10 VITALS — BP 134/72 | HR 101 | Temp 98.4°F | Wt 135.1 lb

## 2018-07-10 DIAGNOSIS — E559 Vitamin D deficiency, unspecified: Secondary | ICD-10-CM

## 2018-07-10 DIAGNOSIS — K2100 Gastro-esophageal reflux disease with esophagitis, without bleeding: Secondary | ICD-10-CM

## 2018-07-10 DIAGNOSIS — E039 Hypothyroidism, unspecified: Secondary | ICD-10-CM | POA: Diagnosis not present

## 2018-07-10 DIAGNOSIS — E119 Type 2 diabetes mellitus without complications: Secondary | ICD-10-CM | POA: Diagnosis not present

## 2018-07-10 DIAGNOSIS — I2510 Atherosclerotic heart disease of native coronary artery without angina pectoris: Secondary | ICD-10-CM

## 2018-07-10 DIAGNOSIS — Z1159 Encounter for screening for other viral diseases: Secondary | ICD-10-CM

## 2018-07-10 DIAGNOSIS — F5101 Primary insomnia: Secondary | ICD-10-CM | POA: Diagnosis not present

## 2018-07-10 DIAGNOSIS — M1991 Primary osteoarthritis, unspecified site: Secondary | ICD-10-CM

## 2018-07-10 DIAGNOSIS — R809 Proteinuria, unspecified: Secondary | ICD-10-CM

## 2018-07-10 DIAGNOSIS — I213 ST elevation (STEMI) myocardial infarction of unspecified site: Secondary | ICD-10-CM | POA: Diagnosis not present

## 2018-07-10 DIAGNOSIS — G894 Chronic pain syndrome: Secondary | ICD-10-CM

## 2018-07-10 MED ORDER — TRAZODONE HCL 150 MG PO TABS: 150.0000 mg | ORAL_TABLET | Freq: Every day | ORAL | 4 refills | Status: DC

## 2018-07-10 MED ORDER — NITROGLYCERIN 0.4 MG SL SUBL: 0.4000 mg | SUBLINGUAL_TABLET | SUBLINGUAL | 0 refills | Status: DC | PRN

## 2018-07-10 MED ORDER — ALBUTEROL SULFATE HFA 108 (90 BASE) MCG/ACT IN AERS: 2.0000 | INHALATION_SPRAY | Freq: Four times a day (QID) | RESPIRATORY_TRACT | 1 refills | Status: DC | PRN

## 2018-07-10 MED ORDER — LISINOPRIL 2.5 MG PO TABS: 2.5000 mg | ORAL_TABLET | Freq: Every day | ORAL | 1 refills | Status: DC

## 2018-07-10 MED ORDER — METFORMIN HCL 500 MG PO TABS: ORAL_TABLET | ORAL | 1 refills | Status: DC

## 2018-07-10 MED ORDER — FAMOTIDINE 20 MG PO TABS: 20.0000 mg | ORAL_TABLET | Freq: Two times a day (BID) | ORAL | 5 refills | Status: DC

## 2018-07-10 MED ORDER — VITAMIN D (ERGOCALCIFEROL) 1.25 MG (50000 UNIT) PO CAPS: 50000.0000 [IU] | ORAL_CAPSULE | ORAL | 1 refills | Status: DC

## 2018-07-10 MED ORDER — TRAMADOL HCL 50 MG PO TABS: 50.0000 mg | ORAL_TABLET | Freq: Four times a day (QID) | ORAL | 0 refills | Status: DC | PRN

## 2018-07-10 NOTE — Patient Instructions (Signed)
Trelegy once a day.

## 2018-07-14 ENCOUNTER — Encounter: Payer: Self-pay | Admitting: Physician Assistant

## 2018-07-14 ENCOUNTER — Ambulatory Visit (INDEPENDENT_AMBULATORY_CARE_PROVIDER_SITE_OTHER): Payer: Medicare Other | Admitting: Physician Assistant

## 2018-07-14 VITALS — BP 157/60 | HR 103 | Ht 62.75 in | Wt 138.0 lb

## 2018-07-14 DIAGNOSIS — R29818 Other symptoms and signs involving the nervous system: Secondary | ICD-10-CM

## 2018-07-14 DIAGNOSIS — K2100 Gastro-esophageal reflux disease with esophagitis, without bleeding: Secondary | ICD-10-CM | POA: Insufficient documentation

## 2018-07-14 DIAGNOSIS — R253 Fasciculation: Secondary | ICD-10-CM

## 2018-07-14 DIAGNOSIS — R51 Headache: Secondary | ICD-10-CM | POA: Diagnosis not present

## 2018-07-14 DIAGNOSIS — G252 Other specified forms of tremor: Secondary | ICD-10-CM

## 2018-07-14 DIAGNOSIS — R413 Other amnesia: Secondary | ICD-10-CM

## 2018-07-14 DIAGNOSIS — I2510 Atherosclerotic heart disease of native coronary artery without angina pectoris: Secondary | ICD-10-CM | POA: Diagnosis not present

## 2018-07-14 DIAGNOSIS — M51369 Other intervertebral disc degeneration, lumbar region without mention of lumbar back pain or lower extremity pain: Secondary | ICD-10-CM

## 2018-07-14 DIAGNOSIS — R2681 Unsteadiness on feet: Secondary | ICD-10-CM

## 2018-07-14 DIAGNOSIS — R519 Headache, unspecified: Secondary | ICD-10-CM

## 2018-07-14 DIAGNOSIS — M5136 Other intervertebral disc degeneration, lumbar region: Secondary | ICD-10-CM | POA: Diagnosis not present

## 2018-07-14 DIAGNOSIS — H579 Unspecified disorder of eye and adnexa: Secondary | ICD-10-CM

## 2018-07-14 DIAGNOSIS — M1991 Primary osteoarthritis, unspecified site: Secondary | ICD-10-CM | POA: Insufficient documentation

## 2018-07-14 MED ORDER — GABAPENTIN 600 MG PO TABS: 600.0000 mg | ORAL_TABLET | Freq: Three times a day (TID) | ORAL | 0 refills | Status: DC

## 2018-07-14 NOTE — Progress Notes (Signed)
Subjective:    Patient ID: [name], female    DOB: 07-01-1950, 68 y.o.   MRN: [id number]  HPI Pt is a 68 yo female with hx of MI, T2DM, rheumatoid arthritis, COPD who presents to the clinic for follow up.   Pt is doing ok. She is still in a lot of pain, everywhere. Does not tolerate NSAIDs.   Breathing is doing well.   She feels like she could increase trazodone to sleep.   She does need refills.   .. Active Ambulatory Problems    Diagnosis Date Noted  . DM (diabetes mellitus) (HCC) 06/05/2007  . Mixed hyperlipidemia 09/28/2007  . HYPERCALCEMIA 11/13/2009  . MDD (major depressive disorder), recurrent episode, mild (HCC) 09/28/2007  . FATTY LIVER DISEASE 09/28/2007  . ARTHRITIS, ACROMIOCLAVICULAR 07/31/2008  . SPINAL STENOSIS IN CERVICAL REGION 07/31/2008  . OSTEOPOROSIS 10/17/2007  . Elevated liver function tests 09/28/2007  . Allergic rhinitis 10/06/2010  . Chronic headache 04/03/2011  . Tobacco use 04/03/2011  . Chronic pain 04/03/2011  . Benign hypertension 04/03/2011  . Sacroiliitis (HCC) 09/14/2011  . Arthritis of carpometacarpal joint 02/14/2012  . Diabetes type 2, controlled (HCC) 03/14/2013  . Hypothyroidism 03/14/2013  . Trochanteric bursitis of right hip 05/04/2013  . Anxiety state 05/16/2013  . Ganglion cyst of wrist 07/13/2013  . Acute pain of left shoulder 09/23/2013  . COLD (chronic obstructive lung disease) (HCC) 12/06/2013  . Primary osteoarthritis of left wrist 12/10/2014  . Hyperlipidemia 01/22/2015  . 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 Myocardial infarct (HCC) 03/09/2017  . CAD (coronary artery disease) 03/09/2017  . Rheumatoid arthritis involving both hands (HCC) 03/09/2017  . Rotator cuff tendonitis, left 03/10/2017  . Anxiety 03/12/2017  . Left upper arm swelling secondary to rotator cuff tear and fluid tracking down biceps tendon sheath 04/14/2017  . Common bile duct dilatation  04/27/2017  . Lumbar degenerative disc disease 05/13/2017  . Abnormal finding on GI tract imaging 05/25/2017  . Chronic back pain 02/27/2016  . Atherosclerosis of native coronary artery of native heart without angina pectoris 03/16/2016  . Bulging lumbar disc 02/27/2016  . RA (rheumatoid arthritis) (HCC) 07/06/2016  . Acquired hypothyroidism 12/01/2015  . Moderate episode of recurrent major depressive disorder (HCC) 07/24/2017  . Bilateral calf pain 07/24/2017  . Memory changes 07/24/2017  . Bilateral cataracts 09/07/2017  . Dry eye syndrome of bilateral lacrimal glands 09/07/2017  . Positive colorectal cancer screening using Cologuard test 10/07/2017  . Microalbuminuria 10/07/2017  . Weakness of both lower extremities 10/07/2017  . History of diverticulitis 11/21/2017  . Non-intractable vomiting with nausea 11/21/2017  . Pyelonephritis 11/21/2017  . Left lower quadrant guarding 11/21/2017  . Generalized abdominal pain 11/21/2017  . Vasovagal syncope 12/16/2017  . Upper abdominal pain 03/10/2018  . Vitamin D deficiency 04/05/2018  . Primary insomnia 04/05/2018  . Hiatal hernia 04/05/2018  . Patellar sleeve fracture, right, closed, initial encounter 04/24/2018   Resolved Ambulatory Problems    Diagnosis Date Noted  . Chronic pain syndrome 06/05/2007  . HEMORRHOIDS, WITH BLEEDING 04/22/2008  . Acute maxillary sinusitis 04/10/2010  . Alcoholic fatty liver 12/24/2009  . RENAL INSUFFICIENCY, ACUTE 10/23/2008  . ROTATOR CUFF SYNDROME, RIGHT 04/01/2008  . ALLERGIC REACTION 12/24/2009  . ELEVATED BLOOD PRESSURE WITHOUT DIAGNOSIS OF HYPERTENSION 05/13/2010  . CHRONIC OBSTRUCTIVE PULMONARY DISEASE, ACUTE EXACERBATION 08/14/2010  . Recurrent acute sinusitis 09/09/2010  . Allergic dermatitis due to poison ivy 11/27/2010  . DERMATITIS, ATOPIC 01/15/2011  .  Ganglion cyst of left foot 07/13/2013  . Chronic pain syndrome 12/05/2013   Past Medical History:  Diagnosis Date  . Cervicalgia    . COPD (chronic obstructive pulmonary disease) (HCC)   . DDD (degenerative disc disease), lumbar   . Diabetes mellitus   . Fatty liver   . Narcotic abuse (HCC)   . Osteoarthritis   . Osteoporosis   . Rheumatoid arthritis (HCC)   . Thyroid disease        Review of Systems    see HPI.  Objective:   Physical Exam Vitals signs reviewed.  Constitutional:      Appearance: Normal appearance.  HENT:     Head: Normocephalic and atraumatic.     Right Ear: Tympanic membrane normal.     Left Ear: Tympanic membrane normal.  Cardiovascular:     Rate and Rhythm: Normal rate.     Pulses: Normal pulses.  Pulmonary:     Effort: Pulmonary effort is normal.     Breath sounds: Normal breath sounds.  Neurological:     General: No focal deficit present.     Mental Status: She is alert and oriented to person, place, and time.  Psychiatric:        Mood and Affect: Mood normal.           Assessment & Plan:  Marland KitchenMarland KitchenDiane was seen today for follow-up.  Diagnoses and all orders for this visit:  Acquired hypothyroidism -     TSH  ST elevation myocardial infarction (STEMI), unspecified artery (HCC) -     nitroGLYCERIN (NITROSTAT) 0.4 MG SL tablet; Place 1 tablet (0.4 mg total) under the tongue every 5 (five) minutes as needed for chest pain.  Coronary artery disease involving native heart without angina pectoris, unspecified vessel or lesion type -     nitroGLYCERIN (NITROSTAT) 0.4 MG SL tablet; Place 1 tablet (0.4 mg total) under the tongue every 5 (five) minutes as needed for chest pain.  Controlled type 2 diabetes mellitus without complication, without long-term current use of insulin (HCC) -     metFORMIN (GLUCOPHAGE) 500 MG tablet; TAKE 1 TABLET(500 MG) BY MOUTH TWICE DAILY WITH A MEAL  Microalbuminuria -     lisinopril (PRINIVIL,ZESTRIL) 2.5 MG tablet; Take 1 tablet (2.5 mg total) by mouth daily.  Need for hepatitis C screening test -     Hepatitis C Antibody  Vitamin D  deficiency -     Vitamin D, Ergocalciferol, (DRISDOL) 1.25 MG (50000 UT) CAPS capsule; Take 1 capsule (50,000 Units total) by mouth every 7 (seven) days.  Gastroesophageal reflux disease with esophagitis -     famotidine (PEPCID) 20 MG tablet; Take 1 tablet (20 mg total) by mouth 2 (two) times daily.  Primary insomnia -     traZODone (DESYREL) 150 MG tablet; Take 1 tablet (150 mg total) by mouth at bedtime.  Primary osteoarthritis, unspecified site -     traMADol (ULTRAM) 50 MG tablet; Take 1 tablet (50 mg total) by mouth every 6 (six) hours as needed for moderate pain.  Chronic pain syndrome -     traMADol (ULTRAM) 50 MG tablet; Take 1 tablet (50 mg total) by mouth every 6 (six) hours as needed for moderate pain.  Other orders -     albuterol (VENTOLIN HFA) 108 (90 Base) MCG/ACT inhaler; Inhale 2 puffs into the lungs every 6 (six) hours as needed for wheezing or shortness of breath.   Increased trazodone to see if helps better with sleep.  Needs TSH recheck. Will adjust accordingly.   a1c up to date.  Continue metformin.   Medication refills given.   Pt has pain everywhere. Tramadol helps. Request tramadol. Given to use sparingly as needed for pain. Pt has a lot of arthritis. Cannot take NSAID well due to GI upset. On methotrexate.   Colonoscopy is in 3 weeks due to positive cologuard.   Follow up in 3 months.

## 2018-07-14 NOTE — Patient Instructions (Signed)
MRI to order.  StoP TRAMADOL>   Watch trazodone with increase and see if maybe go back to 100mg  dose and see if there is improvement.

## 2018-07-14 NOTE — Progress Notes (Signed)
Subjective:    Patient ID: [name], female    DOB: 12/27/1950, 68 y.o.   MRN: [id number]  HPI  Pt is a 68 yo female with hx of MI, CAD, HTN, COPD, T2DM, RA, chronic pain, MDD/GAD who presents to the clinic to discuss muscle twitching/jerking/tremors.   She feels like symptoms have been present for last year or more but getting worse in the last month and much worse over the last week. Her muscle jerks will happen all of sudden and cause her to drop things. She is constantly forgetting words at the end of sentence. She denies forgetting people. She forgets what she is going in the next room for. Her tremor is so bad at times she is not able to write very well. She has been having more headaches. She feels very unsteady in her gait.   She was given tramadol for pain but her suboxone was not on med list. This is not beneficial to be on. Will stop tramadol. Pain is not good. She wonders if there is anything can be done for pain.   .. Active Ambulatory Problems    Diagnosis Date Noted  . DM (diabetes mellitus) (HCC) 06/05/2007  . Mixed hyperlipidemia 09/28/2007  . HYPERCALCEMIA 11/13/2009  . MDD (major depressive disorder), recurrent episode, mild (HCC) 09/28/2007  . Chronic pain syndrome 06/05/2007  . FATTY LIVER DISEASE 09/28/2007  . ARTHRITIS, ACROMIOCLAVICULAR 07/31/2008  . SPINAL STENOSIS IN CERVICAL REGION 07/31/2008  . OSTEOPOROSIS 10/17/2007  . Elevated liver function tests 09/28/2007  . Allergic rhinitis 10/06/2010  . Chronic headache 04/03/2011  . Tobacco use 04/03/2011  . Chronic pain 04/03/2011  . Benign hypertension 04/03/2011  . Sacroiliitis (HCC) 09/14/2011  . Arthritis of carpometacarpal joint 02/14/2012  . Diabetes type 2, controlled (HCC) 03/14/2013  . Hypothyroidism 03/14/2013  . Trochanteric bursitis of right hip 05/04/2013  . Anxiety state 05/16/2013  . Ganglion cyst of wrist 07/13/2013  . Acute pain of left shoulder 09/23/2013  . COLD (chronic obstructive  lung disease) (HCC) 12/06/2013  . Primary osteoarthritis of left wrist 12/10/2014  . Hyperlipidemia 01/22/2015  . 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 Myocardial infarct (HCC) 03/09/2017  . CAD (coronary artery disease) 03/09/2017  . Rheumatoid arthritis involving both hands (HCC) 03/09/2017  . Rotator cuff tendonitis, left 03/10/2017  . Anxiety 03/12/2017  . Left upper arm swelling secondary to rotator cuff tear and fluid tracking down biceps tendon sheath 04/14/2017  . Common bile duct dilatation 04/27/2017  . Lumbar degenerative disc disease 05/13/2017  . Abnormal finding on GI tract imaging 05/25/2017  . Chronic back pain 02/27/2016  . Atherosclerosis of native coronary artery of native heart without angina pectoris 03/16/2016  . Bulging lumbar disc 02/27/2016  . RA (rheumatoid arthritis) (HCC) 07/06/2016  . Acquired hypothyroidism 12/01/2015  . Moderate episode of recurrent major depressive disorder (HCC) 07/24/2017  . Bilateral calf pain 07/24/2017  . Memory changes 07/24/2017  . Bilateral cataracts 09/07/2017  . Dry eye syndrome of bilateral lacrimal glands 09/07/2017  . Positive colorectal cancer screening using Cologuard test 10/07/2017  . Microalbuminuria 10/07/2017  . Weakness of both lower extremities 10/07/2017  . History of diverticulitis 11/21/2017  . Non-intractable vomiting with nausea 11/21/2017  . Pyelonephritis 11/21/2017  . Left lower quadrant guarding 11/21/2017  . Generalized abdominal pain 11/21/2017  . Vasovagal syncope 12/16/2017  . Upper abdominal pain 03/10/2018  . Vitamin D deficiency 04/05/2018  . Primary insomnia 04/05/2018  .  Hiatal hernia 04/05/2018  . Patellar sleeve fracture, right, closed, initial encounter 04/24/2018  . Primary osteoarthritis 07/14/2018  . Gastroesophageal reflux disease with esophagitis 07/14/2018  . Fixed pupil of both eyes 07/18/2018  . Frequent headaches 07/18/2018  .  Unsteady gait 07/18/2018   Resolved Ambulatory Problems    Diagnosis Date Noted  . HEMORRHOIDS, WITH BLEEDING 04/22/2008  . Acute maxillary sinusitis 04/10/2010  . Alcoholic fatty liver 12/24/2009  . RENAL INSUFFICIENCY, ACUTE 10/23/2008  . ROTATOR CUFF SYNDROME, RIGHT 04/01/2008  . ALLERGIC REACTION 12/24/2009  . ELEVATED BLOOD PRESSURE WITHOUT DIAGNOSIS OF HYPERTENSION 05/13/2010  . CHRONIC OBSTRUCTIVE PULMONARY DISEASE, ACUTE EXACERBATION 08/14/2010  . Recurrent acute sinusitis 09/09/2010  . Allergic dermatitis due to poison ivy 11/27/2010  . DERMATITIS, ATOPIC 01/15/2011  . Ganglion cyst of left foot 07/13/2013  . Chronic pain syndrome 12/05/2013   Past Medical History:  Diagnosis Date  . Cervicalgia   . COPD (chronic obstructive pulmonary disease) (HCC)   . DDD (degenerative disc disease), lumbar   . Diabetes mellitus   . Fatty liver   . Narcotic abuse (HCC)   . Osteoarthritis   . Osteoporosis   . Rheumatoid arthritis (HCC)   . Thyroid disease      Review of Systems See HPI>     Objective:   Physical Exam Constitutional:      Appearance: Normal appearance.  Eyes:     Comments: Bilateral pupils did not respond to light.   Cardiovascular:     Rate and Rhythm: Normal rate.  Neurological:     Mental Status: She is alert and oriented to person, place, and time.     Cranial Nerves: No cranial nerve deficit.     Coordination: Coordination abnormal.     Gait: Gait abnormal.     Comments: She stumbled trying to walk a straight line.  Intention tremor noted with finger to nose.  Weak in extremities.            Assessment & Plan:  Marland KitchenMarland KitchenDiagnoses and all orders for this visit:  Intention tremor  Lumbar degenerative disc disease -     gabapentin (NEURONTIN) 600 MG tablet; Take 1 tablet (600 mg total) by mouth 3 (three) times daily.  Muscle twitching  Memory changes  Fixed pupil of both eyes  Frequent headaches  Unsteady gait  Other symptoms and signs  involving the nervous system -     MR Brain W Wo Contrast     .. MMSE - Mini Mental State Exam 07/14/2018  Orientation to time 5  Orientation to Place 5  Registration 3  Attention/ Calculation 3  Recall 3  Language- name 2 objects 2  Language- repeat 1  Language- follow 3 step command 3  Language- read & follow direction 1  Write a sentence 1  Copy design 1  Total score 28   MMSE looks good.  With fixed pupils/headaches/muscle twitch/unsteady gait will get MRI.  Reassured patient I did not see any signs of resting tremor with parkinson or shuffled gait.   STOP tramadol.  Increased gabapentin to three times a day for pain.   Trazodone was increased at last visit. Watch for any side effects.   [name].Spent 30 minutes with patient and greater than 50 percent of visit spent counseling patient regarding treatment plan.

## 2018-07-18 ENCOUNTER — Other Ambulatory Visit: Payer: Self-pay

## 2018-07-18 DIAGNOSIS — R2681 Unsteadiness on feet: Secondary | ICD-10-CM | POA: Insufficient documentation

## 2018-07-18 DIAGNOSIS — R29818 Other symptoms and signs involving the nervous system: Secondary | ICD-10-CM | POA: Insufficient documentation

## 2018-07-18 DIAGNOSIS — H579 Unspecified disorder of eye and adnexa: Secondary | ICD-10-CM | POA: Insufficient documentation

## 2018-07-18 DIAGNOSIS — R519 Headache, unspecified: Secondary | ICD-10-CM | POA: Insufficient documentation

## 2018-07-18 DIAGNOSIS — R51 Headache: Secondary | ICD-10-CM

## 2018-07-18 NOTE — Progress Notes (Signed)
Ordered CMP for imaging.

## 2018-07-19 DIAGNOSIS — Z79899 Other long term (current) drug therapy: Secondary | ICD-10-CM | POA: Diagnosis not present

## 2018-07-19 DIAGNOSIS — M19042 Primary osteoarthritis, left hand: Secondary | ICD-10-CM | POA: Diagnosis not present

## 2018-07-19 DIAGNOSIS — M0579 Rheumatoid arthritis with rheumatoid factor of multiple sites without organ or systems involvement: Secondary | ICD-10-CM | POA: Diagnosis not present

## 2018-07-19 DIAGNOSIS — M19041 Primary osteoarthritis, right hand: Secondary | ICD-10-CM | POA: Diagnosis not present

## 2018-07-22 ENCOUNTER — Ambulatory Visit (HOSPITAL_BASED_OUTPATIENT_CLINIC_OR_DEPARTMENT_OTHER): Payer: Medicare Other

## 2018-07-28 DIAGNOSIS — R2681 Unsteadiness on feet: Secondary | ICD-10-CM | POA: Diagnosis not present

## 2018-07-28 DIAGNOSIS — E039 Hypothyroidism, unspecified: Secondary | ICD-10-CM | POA: Diagnosis not present

## 2018-07-28 DIAGNOSIS — Z1159 Encounter for screening for other viral diseases: Secondary | ICD-10-CM | POA: Diagnosis not present

## 2018-07-28 LAB — COMPLETE METABOLIC PANEL WITH GFR
AG Ratio: 1.9 (calc) (ref 1.0–2.5)
ALT: 14 U/L (ref 6–29)
AST: 15 U/L (ref 10–35)
Albumin: 4.8 g/dL (ref 3.6–5.1)
Alkaline phosphatase (APISO): 81 U/L (ref 37–153)
BUN: 13 mg/dL (ref 7–25)
CO2: 33 mmol/L — ABNORMAL HIGH (ref 20–32)
Calcium: 10.7 mg/dL — ABNORMAL HIGH (ref 8.6–10.4)
Chloride: 97 mmol/L — ABNORMAL LOW (ref 98–110)
Creat: 0.81 mg/dL (ref 0.50–0.99)
GFR, Est African American: 86 mL/min/{1.73_m2} (ref >=60)
GFR, Est Non African American: 75 mL/min/{1.73_m2} (ref >=60)
Globulin: 2.5 g/dL (calc) (ref 1.9–3.7)
Glucose, Bld: 161 mg/dL — ABNORMAL HIGH (ref 65–139)
Potassium: 4 mmol/L (ref 3.5–5.3)
Sodium: 139 mmol/L (ref 135–146)
Total Bilirubin: 0.4 mg/dL (ref 0.2–1.2)
Total Protein: 7.3 g/dL (ref 6.1–8.1)

## 2018-07-29 ENCOUNTER — Ambulatory Visit (HOSPITAL_BASED_OUTPATIENT_CLINIC_OR_DEPARTMENT_OTHER): Admission: RE | Admit: 2018-07-29 | Payer: Medicare Other | Source: Ambulatory Visit

## 2018-07-31 LAB — TSH: TSH: 3.32 m[IU]/L (ref 0.40–4.50)

## 2018-07-31 LAB — HEPATITIS C ANTIBODY
Hepatitis C Ab: NONREACTIVE
SIGNAL TO CUT-OFF: 0 (ref <1.00)

## 2018-07-31 NOTE — Progress Notes (Signed)
Call pt: thyroid in normal range. Do you need refills.

## 2018-07-31 NOTE — Progress Notes (Signed)
Call pt: calcium a little elevated in blood. prolia shot should help with this. Also making sure taking vitamin D.

## 2018-08-03 ENCOUNTER — Other Ambulatory Visit: Payer: Self-pay | Admitting: Physician Assistant

## 2018-08-03 DIAGNOSIS — E039 Hypothyroidism, unspecified: Secondary | ICD-10-CM

## 2018-08-05 ENCOUNTER — Ambulatory Visit (HOSPITAL_BASED_OUTPATIENT_CLINIC_OR_DEPARTMENT_OTHER)
Admission: RE | Admit: 2018-08-05 | Discharge: 2018-08-05 | Disposition: A | Discharge status: Home / Self Care | Payer: Medicare Other | Source: Ambulatory Visit | Attending: Physician Assistant | Admitting: Physician Assistant

## 2018-08-05 DIAGNOSIS — R413 Other amnesia: Secondary | ICD-10-CM | POA: Diagnosis not present

## 2018-08-05 DIAGNOSIS — R29818 Other symptoms and signs involving the nervous system: Secondary | ICD-10-CM | POA: Diagnosis not present

## 2018-08-05 MED ORDER — GADOBUTROL 1 MMOL/ML IV SOLN
6.2000 mL | Freq: Once | INTRAVENOUS | Status: AC | PRN
  Administered 2018-08-05: 6.2 mL via INTRAVENOUS

## 2018-08-07 NOTE — Progress Notes (Signed)
MRI looks essential good. You do have some small vessel disease. Cholesterol medication can help with this. Not smoking can help with this. Reassuring MRI.

## 2018-08-07 NOTE — Progress Notes (Signed)
Can we get her set up for prolia?

## 2018-08-11 ENCOUNTER — Telehealth: Payer: Self-pay | Admitting: Physician Assistant

## 2018-08-11 NOTE — Telephone Encounter (Signed)
Information has been sent to insurance and waiting on a response.   

## 2018-08-11 NOTE — Telephone Encounter (Signed)
This medication or product is on your plan's list of covered drugs. Prior authorization is not required at this time

## 2018-08-11 NOTE — Telephone Encounter (Signed)
PCP would like to start Prolia. Labs complete. Routing for PA.

## 2018-08-12 ENCOUNTER — Other Ambulatory Visit (HOSPITAL_BASED_OUTPATIENT_CLINIC_OR_DEPARTMENT_OTHER): Payer: Medicare Other

## 2018-08-18 ENCOUNTER — Ambulatory Visit: Payer: Medicare Other | Admitting: Physician Assistant

## 2018-08-31 ENCOUNTER — Other Ambulatory Visit: Payer: Self-pay | Admitting: Cardiology

## 2018-08-31 ENCOUNTER — Other Ambulatory Visit: Payer: Self-pay | Admitting: Physician Assistant

## 2018-08-31 DIAGNOSIS — I213 ST elevation (STEMI) myocardial infarction of unspecified site: Secondary | ICD-10-CM

## 2018-08-31 DIAGNOSIS — I2510 Atherosclerotic heart disease of native coronary artery without angina pectoris: Secondary | ICD-10-CM

## 2018-09-07 ENCOUNTER — Ambulatory Visit: Payer: Medicare Other | Admitting: Sports Medicine

## 2018-09-19 ENCOUNTER — Encounter: Payer: Self-pay | Admitting: Physician Assistant

## 2018-09-19 ENCOUNTER — Ambulatory Visit (INDEPENDENT_AMBULATORY_CARE_PROVIDER_SITE_OTHER): Payer: Medicare Other | Admitting: Physician Assistant

## 2018-09-19 VITALS — Ht 62.75 in | Wt 132.0 lb

## 2018-09-19 DIAGNOSIS — F419 Anxiety disorder, unspecified: Secondary | ICD-10-CM | POA: Diagnosis not present

## 2018-09-19 DIAGNOSIS — J449 Chronic obstructive pulmonary disease, unspecified: Secondary | ICD-10-CM | POA: Diagnosis not present

## 2018-09-19 DIAGNOSIS — J4489 Other specified chronic obstructive pulmonary disease: Secondary | ICD-10-CM

## 2018-09-19 DIAGNOSIS — E039 Hypothyroidism, unspecified: Secondary | ICD-10-CM

## 2018-09-19 DIAGNOSIS — F5101 Primary insomnia: Secondary | ICD-10-CM | POA: Diagnosis not present

## 2018-09-19 MED ORDER — LEVOTHYROXINE SODIUM 137 MCG PO TABS: ORAL_TABLET | ORAL | 1 refills | Status: DC

## 2018-09-19 MED ORDER — ALPRAZOLAM 1 MG PO TABS: 1.0000 mg | ORAL_TABLET | Freq: Every evening | ORAL | 5 refills | Status: DC | PRN
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
## 2018-09-26 ENCOUNTER — Other Ambulatory Visit: Payer: Self-pay

## 2018-09-26 ENCOUNTER — Other Ambulatory Visit: Payer: Self-pay | Admitting: Physician Assistant

## 2018-09-26 DIAGNOSIS — E559 Vitamin D deficiency, unspecified: Secondary | ICD-10-CM

## 2018-09-26 NOTE — Telephone Encounter (Signed)
Received fax from pharmacy requesting RF on Vit D.   Per last labs: "Notes recorded by [name], PA-C on 05/22/2018 at 4:48 PM EST Vitamin D is still very low. Need high dose and recheck in 3 months. Take with protein or meal."  RF denied with note that pt needs labs. Vit D labs ordered

## 2018-09-29 ENCOUNTER — Other Ambulatory Visit: Payer: Self-pay

## 2018-09-29 DIAGNOSIS — M5136 Other intervertebral disc degeneration, lumbar region: Secondary | ICD-10-CM

## 2018-09-29 MED ORDER — GABAPENTIN 600 MG PO TABS: 600.0000 mg | ORAL_TABLET | Freq: Three times a day (TID) | ORAL | 0 refills | Status: DC

## 2018-09-29 NOTE — Telephone Encounter (Signed)
Pt requesting RF on Gabapentin  She was recently increased to 1 tab TID but needs new RX sent to [facility]. Requesting 90 day supply.

## 2018-10-03 ENCOUNTER — Ambulatory Visit (INDEPENDENT_AMBULATORY_CARE_PROVIDER_SITE_OTHER): Payer: Medicare Other | Admitting: Family Medicine

## 2018-10-03 ENCOUNTER — Ambulatory Visit (INDEPENDENT_AMBULATORY_CARE_PROVIDER_SITE_OTHER): Payer: Medicare Other

## 2018-10-03 ENCOUNTER — Other Ambulatory Visit: Payer: Self-pay

## 2018-10-03 ENCOUNTER — Encounter: Payer: Self-pay | Admitting: Family Medicine

## 2018-10-03 VITALS — BP 166/72 | HR 93 | Temp 99.2°F | Wt 132.0 lb

## 2018-10-03 DIAGNOSIS — I2510 Atherosclerotic heart disease of native coronary artery without angina pectoris: Secondary | ICD-10-CM | POA: Diagnosis not present

## 2018-10-03 DIAGNOSIS — M79671 Pain in right foot: Secondary | ICD-10-CM

## 2018-10-03 DIAGNOSIS — S93601A Unspecified sprain of right foot, initial encounter: Secondary | ICD-10-CM | POA: Diagnosis not present

## 2018-10-03 MED ORDER — KETOROLAC TROMETHAMINE 60 MG/2ML IM SOLN
60.0000 mg | Freq: Once | INTRAMUSCULAR | Status: AC
  Administered 2018-10-03: 60 mg via INTRAMUSCULAR

## 2018-10-03 NOTE — Progress Notes (Signed)
[name] is a 68 y.o. female who presents to [facility] Medicine today for right foot pain.  [name] awoke this morning with pain and swelling in the right lateral midfoot and forefoot.  She notes that she got up to go to the bathroom last night but does not remember any injury.  She notes significant pain with difficulty with walking.  No fevers chills nausea vomiting or diarrhea.  Patient denies any history of gout.  ROS:  As above  Exam:  BP (!) 166/72   Pulse 93   Temp 99.2 F (37.3 C) (Oral)   Wt 132 lb (59.9 kg)   BMI 23.57 kg/m  Wt Readings from Last 5 Encounters:  10/03/18 132 lb (59.9 kg)  09/19/18 132 lb (59.9 kg)  07/14/18 138 lb (62.6 kg)  07/10/18 135 lb 1.9 oz (61.3 kg)  05/30/18 136 lb (61.7 kg)   General: Well Developed, well nourished, and in no acute distress.  Neuro/Psych: Alert and oriented x3, extra-ocular muscles intact, able to move all 4 extremities, sensation grossly intact. Skin: Warm and dry, no rashes noted.  Respiratory: Not using accessory muscles, speaking in full sentences, trachea midline.  Cardiovascular: Pulses palpable, no extremity edema. Abdomen: Does not appear distended. MSK: Right foot slightly swollen across the dorsal lateral midfoot and forefoot but no erythema or other deformities. Normal foot and ankle motion. Proximal leg and ankle are nontender. Dorsal to lateral midfoot tender to palpation.  Mildly tender palpation across lateral forefoot at fifth metatarsal. Pulses cap refill and sensation are intact. Stable ligamentous exam at ankle.  Patient guards with foot exam motion testing.    Lab and Radiology Results   X-ray images right foot personally independently reviewed Old healed fracture at midshaft fifth metatarsal.  No fractures visible at midfoot per my read.  No acute fractures present. Await formal radiology review  Lab Results  Component Value Date   LABURIC 5.6  12/10/2014     Chemistry      Component Value Date/Time   NA 139 07/28/2018 1357   NA 139 11/28/2014   K 4.0 07/28/2018 1357   CL 97 (L) 07/28/2018 1357   CO2 33 (H) 07/28/2018 1357   BUN 13 07/28/2018 1357   BUN 11 02/27/2016   CREATININE 0.81 07/28/2018 1357   GLU 131 02/27/2016      Component Value Date/Time   CALCIUM 10.7 (H) 07/28/2018 1357   ALKPHOS 193 (H) 12/10/2014 1354   AST 15 07/28/2018 1357   ALT 14 07/28/2018 1357   BILITOT 0.4 07/28/2018 1357        Assessment and Plan: 68 y.o. female with right midfoot and forefoot pain.  Unclear etiology with no obvious injury.  No history of gout with normal uric acid a few years ago.  Differential is likely midfoot strain.  Stress reaction is also a possibility.  Fracture is unlikely as per my read x-rays were nondiagnostic however x-ray radiology over read is still pending.  Plan for postop shoe and advance activity as tolerated while using a walker as needed.  Treat with over-the-counter prescription strength NSAIDs or Tylenol.  60 mg Toradol injection given prior to discharge.  Chemistry February shows normal creatinine.  No history of stomach ulcers.  Recheck in 2 weeks.  We will recheck in person if not better and will recheck via video visit if mostly better.  Orders Placed This Encounter  Procedures  . DG Foot Complete Right  Standing Status:   Future    Number of Occurrences:   1    Standing Expiration Date:   12/03/2019    Order Specific Question:   Reason for Exam (SYMPTOM  OR DIAGNOSIS REQUIRED)    Answer:   eval right lateal foot    Order Specific Question:   Preferred imaging location?    Answer:   [facility]    Order Specific Question:   Radiology Contrast Protocol - do NOT remove file path    Answer:   [email or web address]   No orders of the defined types were placed in this encounter.   Historical information moved to improve visibility of  documentation.  Past Medical History:  Diagnosis Date  . CAD (coronary artery disease)   . Cervicalgia   . Chronic pain   . COPD (chronic obstructive pulmonary disease) (HCC)   . DDD (degenerative disc disease), lumbar   . Diabetes mellitus   . Fatty liver   . Hyperlipidemia   . Narcotic abuse (HCC)    history  . Osteoarthritis   . Osteoporosis   . Rheumatoid arthritis (HCC)   . Thyroid disease    hypothyroid   Past Surgical History:  Procedure Laterality Date  . BREAST SURGERY     braest lump/ benign  . CHOLECYSTECTOMY    . LUMBAR LAMINECTOMY  05/2010   Dr. Netta Corrigan  . TOTAL KNEE ARTHROPLASTY  2010   right   Social History   Tobacco Use  . Smoking status: Current Every Day Smoker    Packs/day: 1.00    Years: 43.00    Pack years: 43.00    Types: Cigarettes  . Smokeless tobacco: Never Used  . Tobacco comment: Has tried Chantix and Wellbutrin in the past  Substance Use Topics  . Alcohol use: Yes    Alcohol/week: 5.0 standard drinks    Types: 5 Standard drinks or equivalent per week    Comment: 1 beer per day   family history includes Cancer in her maternal grandmother; Cirrhosis in her sister; Diabetes in her father; Heart disease (age of onset: 58) in her father and mother; Hyperlipidemia in her mother; Lymphoma in her mother; Rheum arthritis in her father and mother.  Medications: Current Outpatient Medications  Medication Sig Dispense Refill  . albuterol (VENTOLIN HFA) 108 (90 Base) MCG/ACT inhaler Inhale 2 puffs into the lungs every 6 (six) hours as needed for wheezing or shortness of breath. 1 Inhaler 1  . alendronate (FOSAMAX) 70 MG tablet TAKE ONE TABLET EVERY 7 DAYS WITH A FULL GLASS OF WATER ON AN EMPTY STOMACH 4 tablet 11  . ALPRAZolam (XANAX) 1 MG tablet Take 1 tablet (1 mg total) by mouth at bedtime as needed. 30 tablet 5  . AMBULATORY NON FORMULARY MEDICATION shingrix 2 dose series for shingles prevention. 1 application 0  . aspirin EC 81 MG tablet Take  81 mg by mouth daily.    [name] atorvastatin (LIPITOR) 80 MG tablet TAKE ONE TABLET BY MOUTH EVERY DAY 90 tablet 2  . Buprenorphine HCl-Naloxone HCl 8-2 MG FILM PLACE ONE AND ONE-HALF UNDER THE TONGUE EVERY 24 HOURS    . Buprenorphine HCl-Naloxone HCl 8-2 MG FILM PLACE TWO FILMS UNDER THE TONGUE EVERY DAY    . cetirizine (ZYRTEC) 10 MG tablet Take 10 mg by mouth daily.    . Cholecalciferol (VITAMIN D3) 1000 units CAPS Take by mouth.    . desvenlafaxine (PRISTIQ) 100 MG 24 hr tablet Take 1 tablet (100 mg total) by  mouth daily. 90 tablet 1  . famotidine (PEPCID) 20 MG tablet Take 1 tablet (20 mg total) by mouth 2 (two) times daily. 60 tablet 5  . Fluticasone-Umeclidin-Vilant (TRELEGY ELLIPTA) 100-62.5-25 MCG/INH AEPB Inhale 1 puff into the lungs daily. 30 each 12  . gabapentin (NEURONTIN) 600 MG tablet Take 1 tablet (600 mg total) by mouth 3 (three) times daily. 270 tablet 0  . hydroxychloroquine (PLAQUENIL) 200 MG tablet TK 1 T PO D    . levothyroxine (SYNTHROID) 137 MCG tablet Take 1 tablet (137 mcg total) by mouth daily before breakfast. 90 tablet 1  . lisinopril (PRINIVIL,ZESTRIL) 2.5 MG tablet Take 1 tablet (2.5 mg total) by mouth daily. 90 tablet 1  . metFORMIN (GLUCOPHAGE) 500 MG tablet TAKE 1 TABLET(500 MG) BY MOUTH TWICE DAILY WITH A MEAL 180 tablet 1  . methotrexate (RHEUMATREX) 15 MG tablet Take 1 tablet (15 mg total) by mouth once a week. Caution: Chemotherapy. Protect from light. 4 tablet 1  . metoprolol tartrate (LOPRESSOR) 25 MG tablet Take 0.5 tablets (12.5 mg total) by mouth 2 (two) times daily. 90 tablet 1  . nitroGLYCERIN (NITROSTAT) 0.4 MG SL tablet Place 1 tablet (0.4 mg total) under the tongue every 5 (five) minutes as needed for chest pain. 10 tablet 0  . pantoprazole (PROTONIX) 40 MG tablet Take 1 tablet (40 mg total) by mouth daily. 90 tablet 1  . promethazine (PHENERGAN) 25 MG tablet Take 1 tablet (25 mg total) by mouth every 6 (six) hours as needed for nausea or vomiting. 30  tablet 0  . traZODone (DESYREL) 150 MG tablet Take 1 tablet (150 mg total) by mouth at bedtime. 90 tablet 4  . triamcinolone cream (KENALOG) 0.1 % Apply 1 application topically 2 (two) times daily. 453.6 g 0  . Vitamin D, Ergocalciferol, (DRISDOL) 1.25 MG (50000 UT) CAPS capsule Take 1 capsule (50,000 Units total) by mouth every 7 (seven) days. 12 capsule 1   No current facility-administered medications for this visit.    Allergies  Allergen Reactions  . Codeine Palpitations and Other (See Comments)  . Naproxen Rash and Palpitations    GI upset  . Budesonide-Formoterol Fumarate Rash    REACTION: rash REACTION: rash REACTION: rash  . Duloxetine Hcl Other (See Comments)    No difference for pain or anti-depressant.  Other reaction(s): Other No difference for pain or anti-depressant. No difference for pain or anti-depressant.  . Fentanyl Other (See Comments)    REACTION: severe aggitation, mood changes Other reaction(s): Hallucination, Psychiatric Hallucinations Hallucinations REACTION: severe aggitation, mood changes   . Tramadol-Acetaminophen     Other reaction(s): Unknown  . Effexor Xr [Venlafaxine Hcl Er]     Felt more anxious or no difference.   . Fluticasone-Salmeterol   . Lisinopril Rash    Maybe we dont really know      Discussed warning signs or symptoms. Please see discharge instructions. Patient expresses understanding.

## 2018-10-03 NOTE — Addendum Note (Signed)
Addended by: Lianne Cure on: 10/03/2018 11:33 AM   Modules accepted: Orders

## 2018-10-03 NOTE — Patient Instructions (Addendum)
Thank you for coming in today.  Use the post op shoe as needed for pain control.  Recheck in 2 week.  Recheck in person if not better.  Recheck via video visit if mostly better.    Foot Sprain  A foot sprain is an injury to one of the strong bands of tissue (ligaments) that connect and support the bones in your feet. The ligament can be stretched too much and tear. A tear can be either partial or complete. The severity of the sprain depends on how much of the ligament was damaged or torn. What are the causes? This condition is usually caused by suddenly twisting or pivoting your foot. What increases the risk? This injury is more likely to occur in people who:  Play a sport, such as basketball or football.  Exercise or play a sport without warming up.  Start a new workout or sport.  Suddenly increase how long or hard they exercise or play a sport.  Have previously injured their foot or ankle. What are the signs or symptoms? Symptoms of this condition start soon after an injury and include:  Pain, especially in the arch of the foot.  Bruising.  Swelling.  Inability to walk or use the foot to support body weight. How is this diagnosed? This condition is diagnosed with a medical history and physical exam. You may also have imaging tests, such as:  X-rays to make sure there are no broken bones (fractures).  An MRI to see if the ligament is torn. How is this treated? Treatment for this condition depends on the severity of the sprain.  Mild sprains can be treated with: ? Rest, ice, compression, and elevation (RICE). ? Keeping your foot in a fixed position (immobilization) for a period of time. This is done if your ligament is overstretched or partially torn. Your health care provider will apply a bandage, splint, or walking boot to keep your foot from moving until it heals. ? Using crutches or a scooter for a few weeks to avoid putting weight on your foot while it is  healing.  Major sprains can be treated with: ? Surgery. This is done if your ligament is fully torn and a procedure is needed to reconnect it to the bone. ? A cast or splint. This will be needed after surgery. A cast or splint will need to stay on your foot while it heals.  In both types of sprains, you may need to exercise or have physical therapy to strengthen your foot. Follow these instructions at home: If you have a bandage, splint, or boot:  Wear the bandage, splint, or boot as told by your health care provider. Remove only as told by your health care provider.  Loosen the bandage, splint, or boot if your toes tingle, become numb, or turn cold and blue.  Keep the bandage, splint, or boot clean and dry. If you have a cast:  Do not stick anything inside the cast to scratch your skin. Doing that increases your risk for infection.  Check the skin around the cast every day. Tell your health care provider about any concerns.  You may put lotion on dry skin around the edges of the cast. Do not put lotion on the skin underneath the cast.  Keep the cast clean and dry. Bathing  Do not take baths, swim, or use a hot tub until your health care provider approves. Ask your health care provider if you may take showers. You may only be  allowed to take sponge baths.  If the bandage, splint, boot, or cast is not waterproof: ? Do not let it get wet. ? Cover it with a watertight covering when you take a shower. Managing pain, stiffness, and swelling   If directed, put ice on the injured area: ? If you have a removable splint, boot, or immobilizer, remove it as told by your health care provider. ? Put ice in a plastic bag. ? Place a towel between your skin and the bag. ? Leave the ice on for 20 minutes, 2-3 times per day.  Move your toes often to avoid stiffness and to lessen swelling.  Raise (elevate) the injured area above the level of your heart while you are sitting or lying  down. Driving  Do not drive or operate heavy machinery while taking pain medicine.  Ask your health care provider when it is safe to drive if you have a bandage, splint, or walking boot on your foot. Activity  Do not use the injured foot to support your body weight until your health care provider says that you can. Use crutches or other supportive devices as directed by your health care provider.  Ask your health care provider what activities are safe for you. Do any exercise or physical therapy as directed.  Gradually increase how much and how far you walk until your health care provider says it is safe to return to full activity. General instructions  If you have a cast, do not put pressure on any part of it until it is fully hardened. This may take several hours.  Take over-the-counter and prescription medicines only as told by your health care provider.  When you can walk without pain, wear supportive shoes that have stiff soles. Do not wear flip-flops, and do not walk barefoot.  Keep all follow-up visits as told by your health care provider. This is important. Contact a health care provider if:  Your pain is not controlled with medicine.  Your bruising or swelling gets worse or does not get better with treatment.  Your splint, boot, or cast is damaged. Get help right away if:  You develop severe numbness or tingling in your foot.  Your foot turns blue, white, or gray, and it feels cold. Summary  A foot sprain is an injury to one of the strong bands of tissue (ligaments) that connect and support the bones in your feet.  Your health care provider may recommend a splint or boot for your foot to support it while it heals. In some cases, surgery may be needed.  Physical therapy can help keep your other muscles strong until your foot gets better. This information is not intended to replace advice given to you by your health care provider. Make sure you discuss any questions  you have with your health care provider. Document Released: 11/06/2001 Document Revised: 05/21/2017 Document Reviewed: 05/21/2017 Elsevier Interactive Patient Education  2019 [employer].

## 2018-10-06 ENCOUNTER — Encounter: Payer: Self-pay | Admitting: Physician Assistant

## 2018-10-06 ENCOUNTER — Ambulatory Visit (INDEPENDENT_AMBULATORY_CARE_PROVIDER_SITE_OTHER): Payer: Medicare Other | Admitting: Physician Assistant

## 2018-10-06 VITALS — Temp 98.6°F | Ht 62.25 in | Wt 132.0 lb

## 2018-10-06 DIAGNOSIS — R112 Nausea with vomiting, unspecified: Secondary | ICD-10-CM | POA: Diagnosis not present

## 2018-10-06 DIAGNOSIS — R509 Fever, unspecified: Secondary | ICD-10-CM | POA: Diagnosis not present

## 2018-10-06 DIAGNOSIS — Z8719 Personal history of other diseases of the digestive system: Secondary | ICD-10-CM

## 2018-10-06 MED ORDER — PROMETHAZINE HCL 25 MG PO TABS: 25.0000 mg | ORAL_TABLET | Freq: Four times a day (QID) | ORAL | 0 refills | Status: DC | PRN

## 2018-10-06 MED ORDER — AMOXICILLIN-POT CLAVULANATE 875-125 MG PO TABS: 1.0000 | ORAL_TABLET | Freq: Two times a day (BID) | ORAL | 0 refills | Status: DC

## 2018-10-06 NOTE — Progress Notes (Signed)
Upset stomach and low grade fever started Tuesday. Highest it has gotten is 99. Nausea and couple episodes of vomiting.

## 2018-10-06 NOTE — Progress Notes (Signed)
Patient ID: [name], female   DOB: 04-12-1951, 68 y.o.   MRN: [id number] .Marland KitchenVirtual Visit via Telephone Note  I connected with [name] on 10/09/18 at  4:00 PM EDT by telephone and verified that I am speaking with the correct person using two identifiers.  Location: Patient: home Provider: clinic   I discussed the limitations, risks, security and privacy concerns of performing an evaluation and management service by telephone and the availability of in person appointments. I also discussed with the patient that there may be a patient responsible charge related to this service. The patient expressed understanding and agreed to proceed.   History of Present Illness: Pt is a 68 yo female with T2DM, HTN, CAD, COPD, GERD, hx of diverticulitis who calls into the clinic with lower abdominal pain and cramping, fever, chills, nausea and vomiting for the past 2 days. She is concerned as we are going into the weekend. Denies any painful urination, irritation, discharge. She has had low grade temperature of 99. She denies any blood in stools. Her lower abdomen is tender to touch. She has not had anything for her nausea and vomiting. No other sick contacts. Her breathing is controlled.   . Active Ambulatory Problems    Diagnosis Date Noted  . DM (diabetes mellitus) (HCC) 06/05/2007  . Mixed hyperlipidemia 09/28/2007  . HYPERCALCEMIA 11/13/2009  . MDD (major depressive disorder), recurrent episode, mild (HCC) 09/28/2007  . Chronic pain syndrome 06/05/2007  . FATTY LIVER DISEASE 09/28/2007  . ARTHRITIS, ACROMIOCLAVICULAR 07/31/2008  . SPINAL STENOSIS IN CERVICAL REGION 07/31/2008  . OSTEOPOROSIS 10/17/2007  . Elevated liver function tests 09/28/2007  . Allergic rhinitis 10/06/2010  . Chronic headache 04/03/2011  . Tobacco use 04/03/2011  . Chronic pain 04/03/2011  . Benign hypertension 04/03/2011  . Sacroiliitis (HCC) 09/14/2011  . Arthritis of carpometacarpal joint 02/14/2012  . Diabetes  type 2, controlled (HCC) 03/14/2013  . Hypothyroidism 03/14/2013  . Trochanteric bursitis of right hip 05/04/2013  . Anxiety state 05/16/2013  . Ganglion cyst of wrist 07/13/2013  . Acute pain of left shoulder 09/23/2013  . COLD (chronic obstructive lung disease) (HCC) 12/06/2013  . Primary osteoarthritis of left wrist 12/10/2014  . Hyperlipidemia 01/22/2015  . 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 Myocardial infarct (HCC) 03/09/2017  . CAD (coronary artery disease) 03/09/2017  . Rheumatoid arthritis involving both hands (HCC) 03/09/2017  . Rotator cuff tendonitis, left 03/10/2017  . Anxiety 03/12/2017  . Left upper arm swelling secondary to rotator cuff tear and fluid tracking down biceps tendon sheath 04/14/2017  . Common bile duct dilatation 04/27/2017  . Lumbar degenerative disc disease 05/13/2017  . Abnormal finding on GI tract imaging 05/25/2017  . Chronic back pain 02/27/2016  . Atherosclerosis of native coronary artery of native heart without angina pectoris 03/16/2016  . Bulging lumbar disc 02/27/2016  . RA (rheumatoid arthritis) (HCC) 07/06/2016  . Acquired hypothyroidism 12/01/2015  . Moderate episode of recurrent major depressive disorder (HCC) 07/24/2017  . Bilateral calf pain 07/24/2017  . Memory changes 07/24/2017  . Bilateral cataracts 09/07/2017  . Dry eye syndrome of bilateral lacrimal glands 09/07/2017  . Positive colorectal cancer screening using Cologuard test 10/07/2017  . Microalbuminuria 10/07/2017  . Weakness of both lower extremities 10/07/2017  . History of diverticulitis 11/21/2017  . Non-intractable vomiting with nausea 11/21/2017  . Pyelonephritis 11/21/2017  . Left lower quadrant guarding 11/21/2017  . Generalized abdominal pain 11/21/2017  . Vasovagal syncope 12/16/2017  .  Upper abdominal pain 03/10/2018  . Vitamin D deficiency 04/05/2018  . Primary insomnia 04/05/2018  . Hiatal hernia  04/05/2018  . Patellar sleeve fracture, right, closed, initial encounter 04/24/2018  . Primary osteoarthritis 07/14/2018  . Gastroesophageal reflux disease with esophagitis 07/14/2018  . Fixed pupil of both eyes 07/18/2018  . Frequent headaches 07/18/2018  . Unsteady gait 07/18/2018   Resolved Ambulatory Problems    Diagnosis Date Noted  . HEMORRHOIDS, WITH BLEEDING 04/22/2008  . Acute maxillary sinusitis 04/10/2010  . Alcoholic fatty liver 12/24/2009  . RENAL INSUFFICIENCY, ACUTE 10/23/2008  . ROTATOR CUFF SYNDROME, RIGHT 04/01/2008  . ALLERGIC REACTION 12/24/2009  . ELEVATED BLOOD PRESSURE WITHOUT DIAGNOSIS OF HYPERTENSION 05/13/2010  . CHRONIC OBSTRUCTIVE PULMONARY DISEASE, ACUTE EXACERBATION 08/14/2010  . Recurrent acute sinusitis 09/09/2010  . Allergic dermatitis due to poison ivy 11/27/2010  . DERMATITIS, ATOPIC 01/15/2011  . Ganglion cyst of left foot 07/13/2013  . Chronic pain syndrome 12/05/2013   Past Medical History:  Diagnosis Date  . Cervicalgia   . COPD (chronic obstructive pulmonary disease) (HCC)   . DDD (degenerative disc disease), lumbar   . Diabetes mellitus   . Fatty liver   . Narcotic abuse (HCC)   . Osteoarthritis   . Osteoporosis   . Rheumatoid arthritis (HCC)   . Thyroid disease       Observations/Objective: No acute distress. Normal breathing.  Normal mood.   .. Today's Vitals   10/06/18 1149  Temp: 98.6 F (37 C)  TempSrc: Oral  Weight: 132 lb (59.9 kg)  Height: 5' 2.25" (1.581 m)   Body mass index is 23.95 kg/m.   Assessment and Plan: Marland KitchenMarland KitchenDiane was seen today for fever.  Diagnoses and all orders for this visit:  Fever and chills -     amoxicillin-clavulanate (AUGMENTIN) 875-125 MG tablet; Take 1 tablet by mouth 2 (two) times daily.  Non-intractable vomiting with nausea -     promethazine (PHENERGAN) 25 MG tablet; Take 1 tablet (25 mg total) by mouth every 6 (six) hours as needed for nausea or vomiting. -      amoxicillin-clavulanate (AUGMENTIN) 875-125 MG tablet; Take 1 tablet by mouth 2 (two) times daily.  History of diverticulitis -     amoxicillin-clavulanate (AUGMENTIN) 875-125 MG tablet; Take 1 tablet by mouth 2 (two) times daily.  discussed with patient most likely viral etiology. However with hx of diverticulitis and having low grade temperature, abdominal pain, nausea and vomiting. Will send augmentin to hold. If symptoms worsen make take. Use phenergan, rest, hydration for next 24-48hrs. Hopefully will not need abx. Call Monday for update. Looking back seemed to have a similar episode back in November. Will continue to monitor.   Follow Up Instructions:    I discussed the assessment and treatment plan with the patient. The patient was provided an opportunity to ask questions and all were answered. The patient agreed with the plan and demonstrated an understanding of the instructions.   The patient was advised to call back or seek an in-person evaluation if the symptoms worsen or if the condition fails to improve as anticipated.  I provided 15 minutes of non-face-to-face time during this encounter.   Tandy Gaw, PA-C

## 2018-10-09 ENCOUNTER — Encounter: Payer: Self-pay | Admitting: Physician Assistant

## 2018-10-10 ENCOUNTER — Ambulatory Visit (INDEPENDENT_AMBULATORY_CARE_PROVIDER_SITE_OTHER): Payer: Medicare Other | Admitting: Physician Assistant

## 2018-10-10 ENCOUNTER — Encounter: Payer: Self-pay | Admitting: Physician Assistant

## 2018-10-10 VITALS — Temp 99.6°F | Ht 62.25 in | Wt 132.0 lb

## 2018-10-10 DIAGNOSIS — R112 Nausea with vomiting, unspecified: Secondary | ICD-10-CM

## 2018-10-10 DIAGNOSIS — R509 Fever, unspecified: Secondary | ICD-10-CM

## 2018-10-10 NOTE — Progress Notes (Signed)
Patient ID: [name], female   DOB: 1950/08/05, 68 y.o.   MRN: [id number] .Marland KitchenVirtual Visit via Telephone Note  I connected with [name] on 10/10/18 at  1:00 PM EDT by telephone and verified that I am speaking with the correct person using two identifiers.  Location: Patient: home Provider: clinic   I discussed the limitations, risks, security and privacy concerns of performing an evaluation and management service by telephone and the availability of in person appointments. I also discussed with the patient that there may be a patient responsible charge related to this service. The patient expressed understanding and agreed to proceed.   History of Present Illness: Pt is a 68 yo female with T2DM, HTN, CAD, COPD, GERD, with hx of diverticulitis who calls in for follow up on 5/8 virtual visit with nausea, vomiting, fever, chills, abdominal pain for the past few days. She admits she is feeling some better. Her nausea, vomiting, abdominal has stopped. She is concerned because she is running a low grade fever around 99. She continues to feel "blah". She did start augmentin 2 days ago. She has been able to advance diet. No new symptoms.   .. Active Ambulatory Problems    Diagnosis Date Noted  . DM (diabetes mellitus) (HCC) 06/05/2007  . Mixed hyperlipidemia 09/28/2007  . HYPERCALCEMIA 11/13/2009  . MDD (major depressive disorder), recurrent episode, mild (HCC) 09/28/2007  . Chronic pain syndrome 06/05/2007  . FATTY LIVER DISEASE 09/28/2007  . ARTHRITIS, ACROMIOCLAVICULAR 07/31/2008  . SPINAL STENOSIS IN CERVICAL REGION 07/31/2008  . OSTEOPOROSIS 10/17/2007  . Elevated liver function tests 09/28/2007  . Allergic rhinitis 10/06/2010  . Chronic headache 04/03/2011  . Tobacco use 04/03/2011  . Chronic pain 04/03/2011  . Benign hypertension 04/03/2011  . Sacroiliitis (HCC) 09/14/2011  . Arthritis of carpometacarpal joint 02/14/2012  . Diabetes type 2, controlled (HCC) 03/14/2013  .  Hypothyroidism 03/14/2013  . Trochanteric bursitis of right hip 05/04/2013  . Anxiety state 05/16/2013  . Ganglion cyst of wrist 07/13/2013  . Acute pain of left shoulder 09/23/2013  . COLD (chronic obstructive lung disease) (HCC) 12/06/2013  . Primary osteoarthritis of left wrist 12/10/2014  . Hyperlipidemia 01/22/2015  . 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 Myocardial infarct (HCC) 03/09/2017  . CAD (coronary artery disease) 03/09/2017  . Rheumatoid arthritis involving both hands (HCC) 03/09/2017  . Rotator cuff tendonitis, left 03/10/2017  . Anxiety 03/12/2017  . Left upper arm swelling secondary to rotator cuff tear and fluid tracking down biceps tendon sheath 04/14/2017  . Common bile duct dilatation 04/27/2017  . Lumbar degenerative disc disease 05/13/2017  . Abnormal finding on GI tract imaging 05/25/2017  . Chronic back pain 02/27/2016  . Atherosclerosis of native coronary artery of native heart without angina pectoris 03/16/2016  . Bulging lumbar disc 02/27/2016  . RA (rheumatoid arthritis) (HCC) 07/06/2016  . Acquired hypothyroidism 12/01/2015  . Moderate episode of recurrent major depressive disorder (HCC) 07/24/2017  . Bilateral calf pain 07/24/2017  . Memory changes 07/24/2017  . Bilateral cataracts 09/07/2017  . Dry eye syndrome of bilateral lacrimal glands 09/07/2017  . Positive colorectal cancer screening using Cologuard test 10/07/2017  . Microalbuminuria 10/07/2017  . Weakness of both lower extremities 10/07/2017  . History of diverticulitis 11/21/2017  . Non-intractable vomiting with nausea 11/21/2017  . Pyelonephritis 11/21/2017  . Left lower quadrant guarding 11/21/2017  . Generalized abdominal pain 11/21/2017  . Vasovagal syncope 12/16/2017  . Upper abdominal pain 03/10/2018  .  Vitamin D deficiency 04/05/2018  . Primary insomnia 04/05/2018  . Hiatal hernia 04/05/2018  . Patellar sleeve fracture, right,  closed, initial encounter 04/24/2018  . Primary osteoarthritis 07/14/2018  . Gastroesophageal reflux disease with esophagitis 07/14/2018  . Fixed pupil of both eyes 07/18/2018  . Frequent headaches 07/18/2018  . Unsteady gait 07/18/2018   Resolved Ambulatory Problems    Diagnosis Date Noted  . HEMORRHOIDS, WITH BLEEDING 04/22/2008  . Acute maxillary sinusitis 04/10/2010  . Alcoholic fatty liver 12/24/2009  . RENAL INSUFFICIENCY, ACUTE 10/23/2008  . ROTATOR CUFF SYNDROME, RIGHT 04/01/2008  . ALLERGIC REACTION 12/24/2009  . ELEVATED BLOOD PRESSURE WITHOUT DIAGNOSIS OF HYPERTENSION 05/13/2010  . CHRONIC OBSTRUCTIVE PULMONARY DISEASE, ACUTE EXACERBATION 08/14/2010  . Recurrent acute sinusitis 09/09/2010  . Allergic dermatitis due to poison ivy 11/27/2010  . DERMATITIS, ATOPIC 01/15/2011  . Ganglion cyst of left foot 07/13/2013  . Chronic pain syndrome 12/05/2013   Past Medical History:  Diagnosis Date  . Cervicalgia   . COPD (chronic obstructive pulmonary disease) (HCC)   . DDD (degenerative disc disease), lumbar   . Diabetes mellitus   . Fatty liver   . Narcotic abuse (HCC)   . Osteoarthritis   . Osteoporosis   . Rheumatoid arthritis (HCC)   . Thyroid disease    Reviewed med, allergy, problem list.     Observations/Objective: No acute distress. Normal breathing.  Normal mood.   .. Today's Vitals   10/10/18 1302  Temp: 99.6 F (37.6 C)  TempSrc: Oral  Weight: 132 lb (59.9 kg)  Height: 5' 2.25" (1.581 m)   Body mass index is 23.95 kg/m.    Assessment and Plan: Marland KitchenMarland KitchenDiane was seen today for fever.  Diagnoses and all orders for this visit:  Fever and chills  Nausea and vomiting, intractability of vomiting not specified, unspecified vomiting type  seems like she is doing better but just has recovered. Discussed if she runs a fever greater than 101 to alert [place]. Continue to slowly advance diet. Complete augmentin seems like it did improve nausea/abdominal pain  and likely the start of diverticulitis. Rest and hydrate. News symptoms alert [place] as well. Discussed viral syndromes can take some time to fully recover. Unlikely covid but I would self quarantine for at least 7 days until feeling completely better.    Follow Up Instructions:    I discussed the assessment and treatment plan with the patient. The patient was provided an opportunity to ask questions and all were answered. The patient agreed with the plan and demonstrated an understanding of the instructions.   The patient was advised to call back or seek an in-person evaluation if the symptoms worsen or if the condition fails to improve as anticipated.  I provided 11 minutes of non-face-to-face time during this encounter.   Tandy Gaw, PA-C

## 2018-10-10 NOTE — Progress Notes (Deleted)
Patient started antibiotics Saturday. Abd pain/nausea is better. Low grade fever still present. Patient concerned.

## 2018-10-10 NOTE — Progress Notes (Deleted)
   Subjective:    Patient ID: [name], female    DOB: 06-Apr-1951, 68 y.o.   MRN: [id number]  HPI Chills and sweats. Better other sie  5/8  Review of Systems     Objective:   Physical Exam        Assessment & Plan:

## 2018-10-13 ENCOUNTER — Telehealth: Payer: Self-pay | Admitting: Neurology

## 2018-10-13 NOTE — Telephone Encounter (Signed)
Patient left vm that she is feeling better and temperature back to normal.   [name] - FYI.

## 2018-10-17 ENCOUNTER — Ambulatory Visit: Payer: Medicare Other | Admitting: Family Medicine

## 2018-10-30 ENCOUNTER — Telehealth: Payer: Self-pay | Admitting: Neurology

## 2018-10-30 NOTE — Telephone Encounter (Signed)
Unfortunately there is nothing proven to prevent COVID; however, I would clean a room and stay in it. You need to self isolate for 14 days if you never become symptomatic if you do become symptomatic then self isolate 14 days from when your symptoms began. I would not have any contact with him. If becomes symptomatic rest, hydrate, tylenol, and use albuterol for breathing. If you feel like you are having difficultly breathing and need assistance go to ER.

## 2018-10-30 NOTE — Telephone Encounter (Signed)
Patient called and left vm that her husband tested positive for Covid. She is self isolating at home. She is asking if there is anything further she should do? Please advise.

## 2018-10-30 NOTE — Telephone Encounter (Signed)
Left message on machine for patient to call back.

## 2018-10-31 ENCOUNTER — Ambulatory Visit (INDEPENDENT_AMBULATORY_CARE_PROVIDER_SITE_OTHER): Payer: Medicare Other | Admitting: Physician Assistant

## 2018-10-31 ENCOUNTER — Encounter: Payer: Self-pay | Admitting: Physician Assistant

## 2018-10-31 VITALS — Ht 62.25 in

## 2018-10-31 DIAGNOSIS — M7582 Other shoulder lesions, left shoulder: Secondary | ICD-10-CM | POA: Diagnosis not present

## 2018-10-31 DIAGNOSIS — J449 Chronic obstructive pulmonary disease, unspecified: Secondary | ICD-10-CM

## 2018-10-31 DIAGNOSIS — I213 ST elevation (STEMI) myocardial infarction of unspecified site: Secondary | ICD-10-CM

## 2018-10-31 DIAGNOSIS — W19XXXA Unspecified fall, initial encounter: Secondary | ICD-10-CM

## 2018-10-31 DIAGNOSIS — M25512 Pain in left shoulder: Secondary | ICD-10-CM

## 2018-10-31 DIAGNOSIS — I2510 Atherosclerotic heart disease of native coronary artery without angina pectoris: Secondary | ICD-10-CM

## 2018-10-31 MED ORDER — DICLOFENAC SODIUM 1 % TD GEL: 4.0000 g | Freq: Four times a day (QID) | TRANSDERMAL | 1 refills | Status: DC

## 2018-10-31 NOTE — Progress Notes (Signed)
Patient ID: [name], female   DOB: Oct 25, 1950, 68 y.o.   MRN: [id number] .[name].Virtual Visit via Telephone Note  I connected with Melissa Gibbonsiane L Tenpenny on 11/01/18 at  1:20 PM EDT by telephone and verified that I am speaking with the correct person using two identifiers.  Location: Patient: home Provider: home   I discussed the limitations, risks, security and privacy concerns of performing an evaluation and management service by telephone and the availability of in person appointments. I also discussed with the patient that there may be a patient responsible charge related to this service. The patient expressed understanding and agreed to proceed.   History of Present Illness: Pt is a 68 yo female with COPD, HTN, CAD, RA, chronic pain, osteoporosis and persistent left shoulder pain calls in with worsening left shoulder pain. She fell and landed on her left shoulder and made worse.   She also reports husband tested positive for COVId yesterday. She is asymptomatic today. He is doing well so far just feels bad.   Left arm with partially torn rotator cuff per patient. She is on suboxone for pain control. She cannot take oral NSAIDs. She is on methotrexate and plaquenil for her autoimmune diseases. She slipped and fell on same arm that has been giving her problems. Pain is much worse. Hurts to move in any direction.   .. Active Ambulatory Problems    Diagnosis Date Noted  . DM (diabetes mellitus) (HCC) 06/05/2007  . Mixed hyperlipidemia 09/28/2007  . HYPERCALCEMIA 11/13/2009  . MDD (major depressive disorder), recurrent episode, mild (HCC) 09/28/2007  . Chronic pain syndrome 06/05/2007  . FATTY LIVER DISEASE 09/28/2007  . ARTHRITIS, ACROMIOCLAVICULAR 07/31/2008  . SPINAL STENOSIS IN CERVICAL REGION 07/31/2008  . OSTEOPOROSIS 10/17/2007  . Elevated liver function tests 09/28/2007  . Allergic rhinitis 10/06/2010  . Chronic headache 04/03/2011  . Tobacco use 04/03/2011  . Chronic pain 04/03/2011   . Benign hypertension 04/03/2011  . Sacroiliitis (HCC) 09/14/2011  . Arthritis of carpometacarpal joint 02/14/2012  . Diabetes type 2, controlled (HCC) 03/14/2013  . Hypothyroidism 03/14/2013  . Trochanteric bursitis of right hip 05/04/2013  . Anxiety state 05/16/2013  . Ganglion cyst of wrist 07/13/2013  . Acute pain of left shoulder 09/23/2013  . COLD (chronic obstructive lung disease) (HCC) 12/06/2013  . Primary osteoarthritis of left wrist 12/10/2014  . Hyperlipidemia 01/22/2015  . 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 Myocardial infarct (HCC) 03/09/2017  . CAD (coronary artery disease) 03/09/2017  . Rheumatoid arthritis involving both hands (HCC) 03/09/2017  . Rotator cuff tendonitis, left 03/10/2017  . Anxiety 03/12/2017  . Left upper arm swelling secondary to rotator cuff tear and fluid tracking down biceps tendon sheath 04/14/2017  . Common bile duct dilatation 04/27/2017  . Lumbar degenerative disc disease 05/13/2017  . Abnormal finding on GI tract imaging 05/25/2017  . Chronic back pain 02/27/2016  . Atherosclerosis of native coronary artery of native heart without angina pectoris 03/16/2016  . Bulging lumbar disc 02/27/2016  . RA (rheumatoid arthritis) (HCC) 07/06/2016  . Acquired hypothyroidism 12/01/2015  . Moderate episode of recurrent major depressive disorder (HCC) 07/24/2017  . Bilateral calf pain 07/24/2017  . Memory changes 07/24/2017  . Bilateral cataracts 09/07/2017  . Dry eye syndrome of bilateral lacrimal glands 09/07/2017  . Positive colorectal cancer screening using Cologuard test 10/07/2017  . Microalbuminuria 10/07/2017  . Weakness of both lower extremities 10/07/2017  . History of diverticulitis 11/21/2017  . Non-intractable vomiting  with nausea 11/21/2017  . Pyelonephritis 11/21/2017  . Left lower quadrant guarding 11/21/2017  . Generalized abdominal pain 11/21/2017  . Vasovagal syncope  12/16/2017  . Upper abdominal pain 03/10/2018  . Vitamin D deficiency 04/05/2018  . Primary insomnia 04/05/2018  . Hiatal hernia 04/05/2018  . Patellar sleeve fracture, right, closed, initial encounter 04/24/2018  . Primary osteoarthritis 07/14/2018  . Gastroesophageal reflux disease with esophagitis 07/14/2018  . Fixed pupil of both eyes 07/18/2018  . Frequent headaches 07/18/2018  . Unsteady gait 07/18/2018   Resolved Ambulatory Problems    Diagnosis Date Noted  . HEMORRHOIDS, WITH BLEEDING 04/22/2008  . Acute maxillary sinusitis 04/10/2010  . Alcoholic fatty liver 12/24/2009  . RENAL INSUFFICIENCY, ACUTE 10/23/2008  . ROTATOR CUFF SYNDROME, RIGHT 04/01/2008  . ALLERGIC REACTION 12/24/2009  . ELEVATED BLOOD PRESSURE WITHOUT DIAGNOSIS OF HYPERTENSION 05/13/2010  . CHRONIC OBSTRUCTIVE PULMONARY DISEASE, ACUTE EXACERBATION 08/14/2010  . Recurrent acute sinusitis 09/09/2010  . Allergic dermatitis due to poison ivy 11/27/2010  . DERMATITIS, ATOPIC 01/15/2011  . Ganglion cyst of left foot 07/13/2013  . Chronic pain syndrome 12/05/2013   Past Medical History:  Diagnosis Date  . Cervicalgia   . COPD (chronic obstructive pulmonary disease) (HCC)   . DDD (degenerative disc disease), lumbar   . Diabetes mellitus   . Fatty liver   . Narcotic abuse (HCC)   . Osteoarthritis   . Osteoporosis   . Rheumatoid arthritis (HCC)   . Thyroid disease    Reviewed med, allergy, problem list.    Observations/Objective: No acute distress. Normal mood.  Normal breathing.  No video capability not able to access ROM.   [name]. Today's Vitals   10/31/18 1059  Height: 5' 2.25" (1.581 m)   Body mass index is 23.95 kg/m.   Assessment and Plan: Marland KitchenMarland KitchenDiane was seen today for shoulder pain.  Diagnoses and all orders for this visit:  Fall, initial encounter -     diclofenac sodium (VOLTAREN) 1 % GEL; Apply 4 g topically 4 (four) times daily. To affected joint. -     diclofenac sodium (VOLTAREN) 1  % GEL; Apply 4 g topically 4 (four) times daily. To affected joint. -     DG Shoulder Left  Coronary artery disease involving native heart without angina pectoris, unspecified vessel or lesion type  ST elevation myocardial infarction (STEMI), unspecified artery (HCC)  COPD (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)  Rotator cuff tendonitis, left -     diclofenac sodium (VOLTAREN) 1 % GEL; Apply 4 g topically 4 (four) times daily. To affected joint. -     diclofenac sodium (VOLTAREN) 1 % GEL; Apply 4 g topically 4 (four) times daily. To affected joint.  Acute pain of left shoulder -     diclofenac sodium (VOLTAREN) 1 % GEL; Apply 4 g topically 4 (four) times daily. To affected joint. -     diclofenac sodium (VOLTAREN) 1 % GEL; Apply 4 g topically 4 (four) times daily. To affected joint. -     DG Shoulder Left   Pt is high risk for fracture with fall. Suggest getting an xray. Ordered today.however due to positive covid in home I she needs to wait at least 14 days before coming to get xray. Pt is on pain medication. Sent voltaren gel. She request prednisone. I do not feel comfortable giving her prednisone with a positive COVID exposure in home. Discussed risk with patient. Encouraged ice and icy hot patches. I suggest using sling for immobilization  until she can get in to office for assessment.   Discussed COVID precautions. She is high risk for complications. Discussed what to do if becomes difficult to breath. Ok to use albuterol inhaler more often. Follow up as needed.   Follow Up Instructions:    I discussed the assessment and treatment plan with the patient. The patient was provided an opportunity to ask questions and all were answered. The patient agreed with the plan and demonstrated an understanding of the instructions.   The patient was advised to call back or seek an in-person evaluation if the symptoms worsen or if the condition fails to improve as  anticipated.  I provided 18 minutes of non-face-to-face time during this encounter.   Tandy Gaw, PA-C

## 2018-10-31 NOTE — Progress Notes (Deleted)
Patient states pain in shoulder worse over last week. She has been self isolating since husband diagnosed with Covid. Aware of information from telephone note. She is asking for prednisone for shoulder. I did advise they are not recommending prednisone for Covid + patients but will discuss with Jade. She is to retake vitals and give to [facility] at visit, when asked she said they were "normal" but did not have readings. When asked if she could retake vitals during phone call she said she had recently fallen on arm, was having a lot of pain, and couldn't at the moment.

## 2018-10-31 NOTE — Telephone Encounter (Signed)
Spoke with patient and she was made aware of all recommendations.

## 2018-11-01 ENCOUNTER — Telehealth: Payer: Self-pay | Admitting: Physician Assistant

## 2018-11-01 NOTE — Telephone Encounter (Signed)
Patient made aware.

## 2018-11-01 NOTE — Telephone Encounter (Signed)
Ok I placed an order for a shoulder xray she can get to confirm she does not have a fracture since she fell. Now that he does not have covid she can get done.

## 2018-11-01 NOTE — Telephone Encounter (Signed)
Patient called and left vm stating that her husband does not have Covid, he just thought he did. He is negative.  FYI - Lesly Rubenstein.

## 2018-11-08 ENCOUNTER — Ambulatory Visit (INDEPENDENT_AMBULATORY_CARE_PROVIDER_SITE_OTHER): Payer: Medicare Other

## 2018-11-08 ENCOUNTER — Other Ambulatory Visit: Payer: Self-pay

## 2018-11-08 DIAGNOSIS — M25512 Pain in left shoulder: Secondary | ICD-10-CM | POA: Diagnosis not present

## 2018-11-10 NOTE — Telephone Encounter (Signed)
Error

## 2018-11-13 NOTE — Progress Notes (Signed)
Call pt: no fracture. Lots of arthritis. When was your last shoulder injection?

## 2018-11-14 ENCOUNTER — Other Ambulatory Visit: Payer: Self-pay | Admitting: Physician Assistant

## 2018-11-14 DIAGNOSIS — F331 Major depressive disorder, recurrent, moderate: Secondary | ICD-10-CM

## 2018-11-21 ENCOUNTER — Ambulatory Visit: Payer: Medicare Other | Admitting: Physician Assistant

## 2018-12-11 ENCOUNTER — Other Ambulatory Visit: Payer: Self-pay | Admitting: Physician Assistant

## 2018-12-11 ENCOUNTER — Encounter: Payer: Self-pay | Admitting: Physician Assistant

## 2018-12-11 ENCOUNTER — Ambulatory Visit (INDEPENDENT_AMBULATORY_CARE_PROVIDER_SITE_OTHER): Payer: Medicare Other | Admitting: Physician Assistant

## 2018-12-11 ENCOUNTER — Ambulatory Visit: Payer: Medicare Other

## 2018-12-11 VITALS — Ht 62.25 in | Wt 132.0 lb

## 2018-12-11 DIAGNOSIS — E119 Type 2 diabetes mellitus without complications: Secondary | ICD-10-CM | POA: Diagnosis not present

## 2018-12-11 DIAGNOSIS — E039 Hypothyroidism, unspecified: Secondary | ICD-10-CM | POA: Diagnosis not present

## 2018-12-11 DIAGNOSIS — F330 Major depressive disorder, recurrent, mild: Secondary | ICD-10-CM | POA: Diagnosis not present

## 2018-12-11 DIAGNOSIS — J449 Chronic obstructive pulmonary disease, unspecified: Secondary | ICD-10-CM

## 2018-12-11 DIAGNOSIS — Z1231 Encounter for screening mammogram for malignant neoplasm of breast: Secondary | ICD-10-CM

## 2018-12-11 DIAGNOSIS — E782 Mixed hyperlipidemia: Secondary | ICD-10-CM

## 2018-12-11 DIAGNOSIS — I2510 Atherosclerotic heart disease of native coronary artery without angina pectoris: Secondary | ICD-10-CM

## 2018-12-11 DIAGNOSIS — R5383 Other fatigue: Secondary | ICD-10-CM | POA: Diagnosis not present

## 2018-12-11 NOTE — Progress Notes (Signed)
Patient ID: [name], female   DOB: 1950-09-23, 68 y.o.   MRN: [id number] .Marland KitchenVirtual Visit via Telephone Note  I connected with [name] on 12/11/18 at 10:10 AM EDT by telephone and verified that I am speaking with the correct person using two identifiers.  Location: Patient: home Provider: clinic   I discussed the limitations, risks, security and privacy concerns of performing an evaluation and management service by telephone and the availability of in person appointments. I also discussed with the patient that there may be a patient responsible charge related to this service. The patient expressed understanding and agreed to proceed.   History of Present Illness: Pt is a 68 yo female with COPD, HTN, CAD, RA, chronic pain, osteoporosis who calls into the clinic with with worsening fatigue. She denies feeling weak but just so tired. Seems to be getting progressively worse over the last 2-3 months. She denies any changes in medication. She denies any depression. She does continue to take trazadone for sleep. She admits no appetite and eating one meal at the max a day. She is not taking vitamin D as well either. She sleeps at least 14 hours a day. She is taking frequent naps. She denies any SOB worsening. She has COPD. Cough is stable.   .. Active Ambulatory Problems    Diagnosis Date Noted  . DM (diabetes mellitus) ([place]) 06/05/2007  . Mixed hyperlipidemia 09/28/2007  . HYPERCALCEMIA 11/13/2009  . MDD (major depressive disorder), recurrent episode, mild ([place]) 09/28/2007  . Chronic pain syndrome 06/05/2007  . FATTY LIVER DISEASE 09/28/2007  . ARTHRITIS, ACROMIOCLAVICULAR 07/31/2008  . SPINAL STENOSIS IN CERVICAL REGION 07/31/2008  . OSTEOPOROSIS 10/17/2007  . Elevated liver function tests 09/28/2007  . Allergic rhinitis 10/06/2010  . Chronic headache 04/03/2011  . Tobacco use 04/03/2011  . Chronic pain 04/03/2011  . Benign hypertension 04/03/2011  . Sacroiliitis ([place]) 09/14/2011   . Arthritis of carpometacarpal joint 02/14/2012  . Diabetes type 2, controlled ([place]) 03/14/2013  . Hypothyroidism 03/14/2013  . Trochanteric bursitis of right hip 05/04/2013  . Anxiety state 05/16/2013  . Ganglion cyst of wrist 07/13/2013  . Acute pain of left shoulder 09/23/2013  . COLD (chronic obstructive lung disease) ([place]) 12/06/2013  . Primary osteoarthritis of left wrist 12/10/2014  . Hyperlipidemia 01/22/2015  . COPD (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 ([place]) 03/05/2015  . Myocardial infarct ([place]) 03/09/2017  . CAD (coronary artery disease) 03/09/2017  . Rheumatoid arthritis involving both hands ([place]) 03/09/2017  . Rotator cuff tendonitis, left 03/10/2017  . Anxiety 03/12/2017  . Left upper arm swelling secondary to rotator cuff tear and fluid tracking down biceps tendon sheath 04/14/2017  . Common bile duct dilatation 04/27/2017  . Lumbar degenerative disc disease 05/13/2017  . Abnormal finding on GI tract imaging 05/25/2017  . Chronic back pain 02/27/2016  . Atherosclerosis of native coronary artery of native heart without angina pectoris 03/16/2016  . Bulging lumbar disc 02/27/2016  . RA (rheumatoid arthritis) ([place]) 07/06/2016  . Acquired hypothyroidism 12/01/2015  . Moderate episode of recurrent major depressive disorder ([place]) 07/24/2017  . Bilateral calf pain 07/24/2017  . Memory changes 07/24/2017  . Bilateral cataracts 09/07/2017  . Dry eye syndrome of bilateral lacrimal glands 09/07/2017  . Positive colorectal cancer screening using Cologuard test 10/07/2017  . Microalbuminuria 10/07/2017  . Weakness of both lower extremities 10/07/2017  . History of diverticulitis 11/21/2017  . Non-intractable vomiting with nausea 11/21/2017  . Pyelonephritis 11/21/2017  . Left lower quadrant  guarding 11/21/2017  . Generalized abdominal pain 11/21/2017  . Vasovagal syncope 12/16/2017  . Upper abdominal pain 03/10/2018  . Vitamin D deficiency  04/05/2018  . Primary insomnia 04/05/2018  . Hiatal hernia 04/05/2018  . Patellar sleeve fracture, right, closed, initial encounter 04/24/2018  . Primary osteoarthritis 07/14/2018  . Gastroesophageal reflux disease with esophagitis 07/14/2018  . Fixed pupil of both eyes 07/18/2018  . Frequent headaches 07/18/2018  . Unsteady gait 07/18/2018  . Fatigue 12/11/2018   Resolved Ambulatory Problems    Diagnosis Date Noted  . HEMORRHOIDS, WITH BLEEDING 04/22/2008  . Acute maxillary sinusitis 04/10/2010  . Alcoholic fatty liver 12/24/2009  . RENAL INSUFFICIENCY, ACUTE 10/23/2008  . ROTATOR CUFF SYNDROME, RIGHT 04/01/2008  . ALLERGIC REACTION 12/24/2009  . ELEVATED BLOOD PRESSURE WITHOUT DIAGNOSIS OF HYPERTENSION 05/13/2010  . CHRONIC OBSTRUCTIVE PULMONARY DISEASE, ACUTE EXACERBATION 08/14/2010  . Recurrent acute sinusitis 09/09/2010  . Allergic dermatitis due to poison ivy 11/27/2010  . DERMATITIS, ATOPIC 01/15/2011  . Ganglion cyst of left foot 07/13/2013  . Chronic pain syndrome 12/05/2013   Past Medical History:  Diagnosis Date  . Cervicalgia   . COPD (chronic obstructive pulmonary disease) (H[facility])   . DDD (degenerative disc disease), lumbar   . Diabetes mellitus   . Fatty liver   . Narcotic abuse ([facility])   . Osteoarthritis   . Osteoporosis   . Rheumatoid arthritis (H[facility])   . Thyroid disease    Reviewed med, allergy, problem list.     Observations/Objective: No acute distress. Normal breathing.  Normal mood.   .. Today's Vitals   12/11/18 0921  Weight: 132 lb (59.9 kg)  Height: 5' 2.25" (1.581 m)   Body mass index is 23.95 kg/m.   Assessment and Plan: Marland KitchenMarland KitchenDiane was seen today for fatigue.  Diagnoses and all orders for this visit:  Fatigue, unspecified type -     CBC with Differential/Platelet  Acquired hypothyroidism -     TSH  Controlled type 2 diabetes mellitus without complication, without long-term current use of insulin (HCC) -     COMPLETE METABOLIC  PANEL WITH GFR -     Hemoglobin A1c  COPD (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)  Mixed hyperlipidemia -     Lipid Panel w/reflex Direct LDL  MDD (major depressive disorder), recurrent episode, mild (HCC)   Unclear etiology of fatigue. Concerned she may not be eating enough. Discussed starting boost/ensure meal replacements.  Make sure taking vitamin D 2000 units daily.  Back off of trazodone at night if too sleepy during the day. Decrease to 100mg  at night.  Will get some labs to look at any metabolic causes.  Certainly worsening COPD could be causing some fatigue. She reports to be doing good. May need to come in to get a pulse ox. May need to consider O2 night oximetry.    Follow Up Instructions:    I discussed the assessment and treatment plan with the patient. The patient was provided an opportunity to ask questions and all were answered. The patient agreed with the plan and demonstrated an understanding of the instructions.   The patient was advised to call back or seek an in-person evaluation if the symptoms worsen or if the condition fails to improve as anticipated.  I provided 11 minutes of non-face-to-face time during this encounter.   , PA-C

## 2018-12-13 DIAGNOSIS — E782 Mixed hyperlipidemia: Secondary | ICD-10-CM | POA: Diagnosis not present

## 2018-12-13 DIAGNOSIS — E119 Type 2 diabetes mellitus without complications: Secondary | ICD-10-CM | POA: Diagnosis not present

## 2018-12-13 DIAGNOSIS — E039 Hypothyroidism, unspecified: Secondary | ICD-10-CM | POA: Diagnosis not present

## 2018-12-13 DIAGNOSIS — R5383 Other fatigue: Secondary | ICD-10-CM | POA: Diagnosis not present

## 2018-12-14 LAB — CBC WITH DIFFERENTIAL/PLATELET
Absolute Monocytes: 592 cells/uL (ref 200–950)
Basophils Absolute: 82 cells/uL (ref 0–200)
Basophils Relative: 0.9 %
Eosinophils Absolute: 282 cells/uL (ref 15–500)
Eosinophils Relative: 3.1 %
HCT: 44.4 % (ref 35.0–45.0)
Hemoglobin: 14.9 g/dL (ref 11.7–15.5)
Lymphs Abs: 2484 cells/uL (ref 850–3900)
MCH: 31.9 pg (ref 27.0–33.0)
MCHC: 33.6 g/dL (ref 32.0–36.0)
MCV: 95.1 fL (ref 80.0–100.0)
MPV: 10.8 fL (ref 7.5–12.5)
Monocytes Relative: 6.5 %
Neutro Abs: 5660 cells/uL (ref 1500–7800)
Neutrophils Relative %: 62.2 %
Platelets: 232 10*3/uL (ref 140–400)
RBC: 4.67 10*6/uL (ref 3.80–5.10)
RDW: 12 % (ref 11.0–15.0)
Total Lymphocyte: 27.3 %
WBC: 9.1 10*3/uL (ref 3.8–10.8)

## 2018-12-14 LAB — LIPID PANEL W/REFLEX DIRECT LDL
Cholesterol: 186 mg/dL (ref <200)
HDL: 83 mg/dL (ref >=50)
LDL Cholesterol (Calc): 79 mg/dL (calc)
Non-HDL Cholesterol (Calc): 103 mg/dL (calc) (ref <130)
Total CHOL/HDL Ratio: 2.2 (calc) (ref <5.0)
Triglycerides: 138 mg/dL (ref <150)

## 2018-12-14 LAB — COMPLETE METABOLIC PANEL WITH GFR
AG Ratio: 1.4 (calc) (ref 1.0–2.5)
ALT: 20 U/L (ref 6–29)
AST: 15 U/L (ref 10–35)
Albumin: 4.4 g/dL (ref 3.6–5.1)
Alkaline phosphatase (APISO): 105 U/L (ref 37–153)
BUN: 12 mg/dL (ref 7–25)
CO2: 33 mmol/L — ABNORMAL HIGH (ref 20–32)
Calcium: 10.2 mg/dL (ref 8.6–10.4)
Chloride: 99 mmol/L (ref 98–110)
Creat: 0.82 mg/dL (ref 0.50–0.99)
GFR, Est African American: 85 mL/min/{1.73_m2} (ref >=60)
GFR, Est Non African American: 73 mL/min/{1.73_m2} (ref >=60)
Globulin: 3.1 g/dL (calc) (ref 1.9–3.7)
Glucose, Bld: 88 mg/dL (ref 65–99)
Potassium: 4.5 mmol/L (ref 3.5–5.3)
Sodium: 139 mmol/L (ref 135–146)
Total Bilirubin: 0.4 mg/dL (ref 0.2–1.2)
Total Protein: 7.5 g/dL (ref 6.1–8.1)

## 2018-12-14 LAB — HEMOGLOBIN A1C
Hgb A1c MFr Bld: 6.4 % of total Hgb — ABNORMAL HIGH (ref <5.7)
Mean Plasma Glucose: 137 (calc)
eAG (mmol/L): 7.6 (calc)

## 2018-12-14 LAB — TSH: TSH: 5.56 mIU/L — ABNORMAL HIGH (ref 0.40–4.50)

## 2018-12-15 ENCOUNTER — Other Ambulatory Visit: Payer: Self-pay | Admitting: Neurology

## 2018-12-15 DIAGNOSIS — E559 Vitamin D deficiency, unspecified: Secondary | ICD-10-CM

## 2018-12-15 MED ORDER — VITAMIN D (ERGOCALCIFEROL) 1.25 MG (50000 UNIT) PO CAPS: 50000.0000 [IU] | ORAL_CAPSULE | ORAL | 0 refills | Status: DC

## 2018-12-15 NOTE — Progress Notes (Signed)
Cholesterol looks great. Your thyroid is out of range. TSH is up showing more low thyroid levels. Since you are so tired. Could be thyroid. Confirm pt is taking medication first thing in am every morning with out food and/or any other medications. If she is then we can increase dose to see if we can get more in the middle.  A!C down which is great news.

## 2018-12-15 NOTE — Telephone Encounter (Signed)
Patient left vm asking for refill of Vitamin D to be sent to [facility]. RX sent.

## 2018-12-18 ENCOUNTER — Ambulatory Visit (INDEPENDENT_AMBULATORY_CARE_PROVIDER_SITE_OTHER): Payer: Medicare Other | Admitting: Family Medicine

## 2018-12-18 ENCOUNTER — Encounter: Payer: Self-pay | Admitting: Family Medicine

## 2018-12-18 ENCOUNTER — Telehealth: Payer: Self-pay | Admitting: Neurology

## 2018-12-18 ENCOUNTER — Ambulatory Visit: Payer: Medicare Other | Admitting: Physician Assistant

## 2018-12-18 VITALS — Temp 98.7°F | Wt 132.0 lb

## 2018-12-18 DIAGNOSIS — I2510 Atherosclerotic heart disease of native coronary artery without angina pectoris: Secondary | ICD-10-CM

## 2018-12-18 DIAGNOSIS — E039 Hypothyroidism, unspecified: Secondary | ICD-10-CM

## 2018-12-18 DIAGNOSIS — R112 Nausea with vomiting, unspecified: Secondary | ICD-10-CM

## 2018-12-18 DIAGNOSIS — R42 Dizziness and giddiness: Secondary | ICD-10-CM

## 2018-12-18 DIAGNOSIS — R110 Nausea: Secondary | ICD-10-CM | POA: Diagnosis not present

## 2018-12-18 DIAGNOSIS — Z5181 Encounter for therapeutic drug level monitoring: Secondary | ICD-10-CM

## 2018-12-18 DIAGNOSIS — M069 Rheumatoid arthritis, unspecified: Secondary | ICD-10-CM

## 2018-12-18 MED ORDER — PROMETHAZINE HCL 25 MG PO TABS: 25.0000 mg | ORAL_TABLET | Freq: Three times a day (TID) | ORAL | 0 refills | Status: DC | PRN

## 2018-12-18 NOTE — Progress Notes (Signed)
X 5-6 days off/on dizziness. She can be sitting and get dizzy. Nothing out of the ordinary for this to happen.  For past 3 days she has been vomiting. Denies f/s/c/d/h/or body aches, sore scratchy throat. Maryruth Eve, Lahoma Crocker, CMA

## 2018-12-18 NOTE — Progress Notes (Signed)
Virtual Visit via Telephone Note  I connected with [name] on 12/18/18 at  1:40 PM EDT by telephone and verified that I am speaking with the correct person using two identifiers.   I discussed the limitations, risks, security and privacy concerns of performing an evaluation and management service by telephone and the availability of in person appointments. I also discussed with the patient that there may be a patient responsible charge related to this service. The patient expressed understanding and agreed to proceed.   Subjective:    CC:   HPI: X 5-6 days off/on dizziness. She can be sitting and get dizzy. Not related position is low. Nothing out of the ordinary for this to happen. Lasts a couple of minutes but then recurs several times within an hour.  Hasn't check BP with it.  She feels more lightheaded.  She denies any worsening with change in position or movement of the head.  She says it just comes on.  Though it seems a little better in the last couple of days.  For past 3 days she has been vomiting.  She feels like this is separate from the dizziness is that she feels they do not occur at the same time.  No known sick contacts.  She does not think she ate anything bad or undercooked.  Denies f/s/c/d/h/or body aches, sore scratchy throat.No abdomen pain or cramping.  Vomited after ate. Today ate 2 cracker today and kept  It down.  No diarrhea. She is not on famotidine, says no relflux sxs. Husband felt bad about 1 week ago.   [facility] would [facility].  Last eye exam was afew years ago. She is on Plaquenil.    Past medical history, Surgical history, Family history not pertinant except as noted below, Social history, Allergies, and medications have been entered into the medical record, reviewed, and corrections made.   Review of Systems: No fevers, chills, night sweats, weight loss, chest pain, or shortness of breath.   Objective:    General: Speaking clearly in  complete sentences without any shortness of breath.  Alert and oriented x3.  Normal judgment. No apparent acute distress.     Impression and Recommendations:   Lightheadedness-unclear etiology.  She does have a home blood pressure cuff so just encouraged her check her blood pressure if she feels lightheaded to see if her blood pressure is either low or high.  She did not check 1 today for [place] to monitor.  Want her to hydrate well.  It does not sound like this is positional with head change.  Recent thyroid level was checked and her dose is being adjusted.  Nausea/vomiting-she actually feels a little better today was able to keep down some crackers.  That is promising that she is feeling a little bit better just encouraged her to hydrate.  Promethazine prescription sent to the pharmacy.  Rheumatoid arthritis-she is on methotrexate and hydroxychloroquine but has not had an eye exam in several years.  Explained that she needs to get exams done every 6 months and encouraged her to make an appointment.  Referral placed today.  Medication monitoring-refer to optometry for routine evaluation since she is on Plaquenil.   I discussed the assessment and treatment plan with the patient. The patient was provided an opportunity to ask questions and all were answered. The patient agreed with the plan and demonstrated an understanding of the instructions.   The patient was advised to call back or seek  an in-person evaluation if the symptoms worsen or if the condition fails to improve as anticipated.  I provided 20 minutes of non-face-to-face time during this encounter.   Beatrice Lecher, MD

## 2018-12-18 NOTE — Telephone Encounter (Signed)
Lab order entered for thyroid level.

## 2018-12-18 NOTE — Progress Notes (Signed)
If you have enough synthroid lets try 1 tablet alternated by 1 and 1/2 tablet recheck in 4 weeks.

## 2018-12-22 ENCOUNTER — Telehealth: Payer: Self-pay

## 2018-12-22 DIAGNOSIS — F17210 Nicotine dependence, cigarettes, uncomplicated: Secondary | ICD-10-CM | POA: Diagnosis not present

## 2018-12-22 DIAGNOSIS — I252 Old myocardial infarction: Secondary | ICD-10-CM | POA: Diagnosis not present

## 2018-12-22 DIAGNOSIS — E079 Disorder of thyroid, unspecified: Secondary | ICD-10-CM | POA: Diagnosis not present

## 2018-12-22 DIAGNOSIS — Z888 Allergy status to other drugs, medicaments and biological substances status: Secondary | ICD-10-CM | POA: Diagnosis not present

## 2018-12-22 DIAGNOSIS — R5383 Other fatigue: Secondary | ICD-10-CM | POA: Diagnosis not present

## 2018-12-22 DIAGNOSIS — J849 Interstitial pulmonary disease, unspecified: Secondary | ICD-10-CM | POA: Diagnosis not present

## 2018-12-22 DIAGNOSIS — Z20828 Contact with and (suspected) exposure to other viral communicable diseases: Secondary | ICD-10-CM | POA: Diagnosis not present

## 2018-12-22 DIAGNOSIS — E119 Type 2 diabetes mellitus without complications: Secondary | ICD-10-CM | POA: Diagnosis not present

## 2018-12-22 DIAGNOSIS — K219 Gastro-esophageal reflux disease without esophagitis: Secondary | ICD-10-CM | POA: Diagnosis not present

## 2018-12-22 DIAGNOSIS — J449 Chronic obstructive pulmonary disease, unspecified: Secondary | ICD-10-CM | POA: Diagnosis not present

## 2018-12-22 DIAGNOSIS — R0602 Shortness of breath: Secondary | ICD-10-CM | POA: Diagnosis not present

## 2018-12-22 DIAGNOSIS — Z885 Allergy status to narcotic agent status: Secondary | ICD-10-CM | POA: Diagnosis not present

## 2018-12-22 DIAGNOSIS — Z79899 Other long term (current) drug therapy: Secondary | ICD-10-CM | POA: Diagnosis not present

## 2018-12-22 NOTE — Telephone Encounter (Signed)
Patient called stating that she has had fevers up to 101 for the last 4 days that wont break. States she has had chills, body aches, and extreme fatigue. Patient was concerned she needed to be seen at hospital.   Patient did not have any known COVID exposures, but states her husband is an Electrical engineer and he is out in public a lot.   I advised patient to go to Emergency room for care. Advised patient this could be COVID and if fever has been that high for almost a week, she needs medical attention.   FYI to PCP

## 2018-12-26 ENCOUNTER — Ambulatory Visit (INDEPENDENT_AMBULATORY_CARE_PROVIDER_SITE_OTHER): Payer: Medicare Other | Admitting: Physician Assistant

## 2018-12-26 ENCOUNTER — Encounter: Payer: Self-pay | Admitting: Physician Assistant

## 2018-12-26 VITALS — BP 124/76 | HR 74 | Ht 62.25 in | Wt 132.0 lb

## 2018-12-26 DIAGNOSIS — R42 Dizziness and giddiness: Secondary | ICD-10-CM

## 2018-12-26 DIAGNOSIS — I2510 Atherosclerotic heart disease of native coronary artery without angina pectoris: Secondary | ICD-10-CM

## 2018-12-26 MED ORDER — MECLIZINE HCL 25 MG PO TABS: 25.0000 mg | ORAL_TABLET | Freq: Three times a day (TID) | ORAL | 0 refills | Status: DC | PRN

## 2018-12-26 NOTE — Progress Notes (Signed)
Patient ID: [name], female   DOB: 15-Aug-1950, 68 y.o.   MRN: [id number] .Marland KitchenVirtual Visit via Telephone Note  I connected with [name] on 12/27/18 at  2:20 PM EDT by telephone and verified that I am speaking with the correct person using two identifiers.  Location: Patient: home Provider: clinic   I discussed the limitations, risks, security and privacy concerns of performing an evaluation and management service by telephone and the availability of in person appointments. I also discussed with the patient that there may be a patient responsible charge related to this service. The patient expressed understanding and agreed to proceed.   History of Present Illness: Pt is a 68 yo female with recent hx of nausea and dizziness for the last 2 weeks. Her nausea has resolved. She is actually feeling a little more energy as well too. Her dizziness is still persistent. She can be sitting and get dizzy. Her BP has been running in the 120's. She does feel like position changes do effect it a little. Sometimes if she looks at something fast she gets dizzy. Denies any speech slurring or limb weakness. She is drinking a lot of water.   .. Active Ambulatory Problems    Diagnosis Date Noted  . DM (diabetes mellitus) (HCC) 06/05/2007  . Mixed hyperlipidemia 09/28/2007  . HYPERCALCEMIA 11/13/2009  . MDD (major depressive disorder), recurrent episode, mild (HCC) 09/28/2007  . Chronic pain syndrome 06/05/2007  . FATTY LIVER DISEASE 09/28/2007  . ARTHRITIS, ACROMIOCLAVICULAR 07/31/2008  . SPINAL STENOSIS IN CERVICAL REGION 07/31/2008  . OSTEOPOROSIS 10/17/2007  . Elevated liver function tests 09/28/2007  . Allergic rhinitis 10/06/2010  . Chronic headache 04/03/2011  . Tobacco use 04/03/2011  . Chronic pain 04/03/2011  . Benign hypertension 04/03/2011  . Sacroiliitis (HCC) 09/14/2011  . Arthritis of carpometacarpal joint 02/14/2012  . Diabetes type 2, controlled (HCC) 03/14/2013  .  Hypothyroidism 03/14/2013  . Trochanteric bursitis of right hip 05/04/2013  . Anxiety state 05/16/2013  . Ganglion cyst of wrist 07/13/2013  . Acute pain of left shoulder 09/23/2013  . COLD (chronic obstructive lung disease) (HCC) 12/06/2013  . Primary osteoarthritis of left wrist 12/10/2014  . Hyperlipidemia 01/22/2015  . 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 Myocardial infarct (HCC) 03/09/2017  . CAD (coronary artery disease) 03/09/2017  . Rheumatoid arthritis involving both hands (HCC) 03/09/2017  . Rotator cuff tendonitis, left 03/10/2017  . Anxiety 03/12/2017  . Left upper arm swelling secondary to rotator cuff tear and fluid tracking down biceps tendon sheath 04/14/2017  . Common bile duct dilatation 04/27/2017  . Lumbar degenerative disc disease 05/13/2017  . Abnormal finding on GI tract imaging 05/25/2017  . Chronic back pain 02/27/2016  . Atherosclerosis of native coronary artery of native heart without angina pectoris 03/16/2016  . Bulging lumbar disc 02/27/2016  . RA (rheumatoid arthritis) (HCC) 07/06/2016  . Acquired hypothyroidism 12/01/2015  . Moderate episode of recurrent major depressive disorder ([facility]) 07/24/2017  . Bilateral calf pain 07/24/2017  . Memory changes 07/24/2017  . Bilateral cataracts 09/07/2017  . Dry eye syndrome of bilateral lacrimal glands 09/07/2017  . Positive colorectal cancer screening using Cologuard test 10/07/2017  . Microalbuminuria 10/07/2017  . Weakness of both lower extremities 10/07/2017  . History of diverticulitis 11/21/2017  . Non-intractable vomiting with nausea 11/21/2017  . Left lower quadrant guarding 11/21/2017  . Generalized abdominal pain 11/21/2017  . Vasovagal syncope 12/16/2017  . Upper abdominal pain 03/10/2018  .  Vitamin D deficiency 04/05/2018  . Primary insomnia 04/05/2018  . Hiatal hernia 04/05/2018  . Patellar sleeve fracture, right, closed, initial encounter  04/24/2018  . Primary osteoarthritis 07/14/2018  . Gastroesophageal reflux disease with esophagitis 07/14/2018  . Fixed pupil of both eyes 07/18/2018  . Frequent headaches 07/18/2018  . Unsteady gait 07/18/2018  . Fatigue 12/11/2018   Resolved Ambulatory Problems    Diagnosis Date Noted  . HEMORRHOIDS, WITH BLEEDING 04/22/2008  . Acute maxillary sinusitis 04/10/2010  . Alcoholic fatty liver 29/93/7169  . RENAL INSUFFICIENCY, ACUTE 10/23/2008  . ROTATOR CUFF SYNDROME, RIGHT 04/01/2008  . ALLERGIC REACTION 12/24/2009  . ELEVATED BLOOD PRESSURE WITHOUT DIAGNOSIS OF HYPERTENSION 05/13/2010  . CHRONIC OBSTRUCTIVE PULMONARY DISEASE, ACUTE EXACERBATION 08/14/2010  . Recurrent acute sinusitis 09/09/2010  . Allergic dermatitis due to poison ivy 11/27/2010  . DERMATITIS, ATOPIC 01/15/2011  . Ganglion cyst of left foot 07/13/2013  . Chronic pain syndrome 12/05/2013  . Pyelonephritis 11/21/2017   Past Medical History:  Diagnosis Date  . Cervicalgia   . COPD (chronic obstructive pulmonary disease) ([place])   . DDD (degenerative disc disease), lumbar   . Diabetes mellitus   . Fatty liver   . Narcotic abuse ([place])   . Osteoarthritis   . Osteoporosis   . Rheumatoid arthritis ([place])   . Thyroid disease     Reviewed med, allergy, problem list.      Observations/Objective: No acute distress. Normal mood.  Normal breathing.   .. Today's Vitals   12/26/18 1427  BP: 124/76  Pulse: 74  Weight: 132 lb (59.9 kg)  Height: 5' 2.25" (1.581 m)   Body mass index is 23.95 kg/m.     Assessment and Plan: Marland KitchenMarland KitchenDiane was seen today for dizziness.  Diagnoses and all orders for this visit:  Dizziness -     meclizine (ANTIVERT) 25 MG tablet; Take 1 tablet (25 mg total) by mouth 3 (three) times daily as needed for dizziness.  unclear etiology. Not in office to do dix hallpike maneuver. MRI done in march and unremarkable except for small vessel disease. Start antivert. Start epley manuevers 3  rounds/3 times a day for one week and let me know how doing. Stay hydrated. BP looks great. No new meds to trigger.   Follow Up Instructions:    I discussed the assessment and treatment plan with the patient. The patient was provided an opportunity to ask questions and all were answered. The patient agreed with the plan and demonstrated an understanding of the instructions.   The patient was advised to call back or seek an in-person evaluation if the symptoms worsen or if the condition fails to improve as anticipated.  I provided 15 minutes of non-face-to-face time during this encounter.   Iran Planas, PA-C

## 2018-12-26 NOTE — Progress Notes (Signed)
Dizziness same. Having some confusion. Saw Dr. Madilyn Fireman last week.

## 2018-12-28 ENCOUNTER — Telehealth: Payer: Self-pay | Admitting: Neurology

## 2018-12-28 DIAGNOSIS — M5136 Other intervertebral disc degeneration, lumbar region: Secondary | ICD-10-CM

## 2018-12-28 DIAGNOSIS — R42 Dizziness and giddiness: Secondary | ICD-10-CM

## 2018-12-28 DIAGNOSIS — R110 Nausea: Secondary | ICD-10-CM

## 2018-12-28 NOTE — Telephone Encounter (Signed)
She states she is doing the exercises some, but it does make her aware dizzy when she does them.

## 2018-12-28 NOTE — Telephone Encounter (Signed)
Patient called and left vm stating meds for dizziness are not helping. She is also still having fatigue.

## 2018-12-28 NOTE — Telephone Encounter (Signed)
Is she doing the exercises? Do the exercises make her more dizzy when doing them?

## 2018-12-29 MED ORDER — GABAPENTIN 600 MG PO TABS: 600.0000 mg | ORAL_TABLET | Freq: Three times a day (TID) | ORAL | 1 refills | Status: DC

## 2018-12-29 NOTE — Telephone Encounter (Signed)
Also called asking for Gabapentin refill.

## 2018-12-29 NOTE — Telephone Encounter (Signed)
Spoke with patient and advised of need for vestibular rehab. She states she can drive, does not need home health. Order entered for vestibular rehab.   [place].

## 2018-12-29 NOTE — Telephone Encounter (Signed)
Ok referral signed

## 2018-12-29 NOTE — Telephone Encounter (Signed)
Ok that is means likely is BPPV and if makes dizzy. Do you feel safe to drive? I would like to get you some vestibular rehab but do they need to come out to your house or can you drive to them?

## 2019-01-02 ENCOUNTER — Ambulatory Visit (INDEPENDENT_AMBULATORY_CARE_PROVIDER_SITE_OTHER): Payer: Medicare Other | Admitting: Physician Assistant

## 2019-01-02 VITALS — Ht 62.25 in | Wt 132.0 lb

## 2019-01-02 DIAGNOSIS — R5383 Other fatigue: Secondary | ICD-10-CM

## 2019-01-02 DIAGNOSIS — J449 Chronic obstructive pulmonary disease, unspecified: Secondary | ICD-10-CM

## 2019-01-02 DIAGNOSIS — F330 Major depressive disorder, recurrent, mild: Secondary | ICD-10-CM | POA: Diagnosis not present

## 2019-01-02 DIAGNOSIS — M5136 Other intervertebral disc degeneration, lumbar region: Secondary | ICD-10-CM | POA: Diagnosis not present

## 2019-01-02 DIAGNOSIS — I2510 Atherosclerotic heart disease of native coronary artery without angina pectoris: Secondary | ICD-10-CM | POA: Diagnosis not present

## 2019-01-02 MED ORDER — BUPROPION HCL ER (XL) 150 MG PO TB24: 150.0000 mg | ORAL_TABLET | ORAL | 0 refills | Status: DC

## 2019-01-02 MED ORDER — BREO ELLIPTA 100-25 MCG/INH IN AEPB: 1.0000 | INHALATION_SPRAY | Freq: Every day | RESPIRATORY_TRACT | 11 refills | Status: DC

## 2019-01-02 MED ORDER — GABAPENTIN 600 MG PO TABS: 600.0000 mg | ORAL_TABLET | Freq: Three times a day (TID) | ORAL | 1 refills | Status: DC

## 2019-01-02 NOTE — Progress Notes (Signed)
Virtual Visit via Telephone Note  I connected with [name] on 01/03/19 at 11:30 AM EDT by telephone and verified that I am speaking with the correct person using two identifiers.  Location: Patient: home Provider: clinic   I discussed the limitations, risks, security and privacy concerns of performing an evaluation and management service by telephone and the availability of in person appointments. I also discussed with the patient that there may be a patient responsible charge related to this service. The patient expressed understanding and agreed to proceed.   History of Present Illness: Patient is a 68 year old female with COPD, history of asthma, chronic pain, major depression who presents to the clinic to follow-up on 2 months of profound fatigue.  She recently had labs that did not show any metabolic reason for fatigue.  She does admit to be coughing more and more short of breath.  She also admits that her mood is very down.  With COVID and quarantine it is caused her to not be able to do much.  She has no trouble sleeping.  She sleeps all night and most of the day.  She has no motivation or energy to do anything.  She was having some significant dizziness.  That has improved but still present.  She never went to vestibular rehab.   .. Active Ambulatory Problems    Diagnosis Date Noted  . DM (diabetes mellitus) (HCC) 06/05/2007  . Mixed hyperlipidemia 09/28/2007  . HYPERCALCEMIA 11/13/2009  . MDD (major depressive disorder), recurrent episode, mild (HCC) 09/28/2007  . Chronic pain syndrome 06/05/2007  . FATTY LIVER DISEASE 09/28/2007  . ARTHRITIS, ACROMIOCLAVICULAR 07/31/2008  . SPINAL STENOSIS IN CERVICAL REGION 07/31/2008  . OSTEOPOROSIS 10/17/2007  . Elevated liver function tests 09/28/2007  . Allergic rhinitis 10/06/2010  . Chronic headache 04/03/2011  . Tobacco use 04/03/2011  . Chronic pain 04/03/2011  . Benign hypertension 04/03/2011  . Sacroiliitis (HCC) 09/14/2011   . Arthritis of carpometacarpal joint 02/14/2012  . Diabetes type 2, controlled (HCC) 03/14/2013  . Hypothyroidism 03/14/2013  . Trochanteric bursitis of right hip 05/04/2013  . Anxiety state 05/16/2013  . Ganglion cyst of wrist 07/13/2013  . Acute pain of left shoulder 09/23/2013  . COLD (chronic obstructive lung disease) (HCC) 12/06/2013  . Primary osteoarthritis of left wrist 12/10/2014  . Hyperlipidemia 01/22/2015  . 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 Myocardial infarct (HCC) 03/09/2017  . CAD (coronary artery disease) 03/09/2017  . Rheumatoid arthritis involving both hands (HCC) 03/09/2017  . Rotator cuff tendonitis, left 03/10/2017  . Anxiety 03/12/2017  . Left upper arm swelling secondary to rotator cuff tear and fluid tracking down biceps tendon sheath 04/14/2017  . Common bile duct dilatation 04/27/2017  . Lumbar degenerative disc disease 05/13/2017  . Abnormal finding on GI tract imaging 05/25/2017  . Chronic back pain 02/27/2016  . Atherosclerosis of native coronary artery of native heart without angina pectoris 03/16/2016  . Bulging lumbar disc 02/27/2016  . RA (rheumatoid arthritis) (HCC) 07/06/2016  . Acquired hypothyroidism 12/01/2015  . Moderate episode of recurrent major depressive disorder (HCC) 07/24/2017  . Bilateral calf pain 07/24/2017  . Memory changes 07/24/2017  . Bilateral cataracts 09/07/2017  . Dry eye syndrome of bilateral lacrimal glands 09/07/2017  . Positive colorectal cancer screening using Cologuard test 10/07/2017  . Microalbuminuria 10/07/2017  . Weakness of both lower extremities 10/07/2017  . History of diverticulitis 11/21/2017  . Non-intractable vomiting with nausea 11/21/2017  . Left lower  quadrant guarding 11/21/2017  . Generalized abdominal pain 11/21/2017  . Vasovagal syncope 12/16/2017  . Upper abdominal pain 03/10/2018  . Vitamin D deficiency 04/05/2018  . Primary insomnia  04/05/2018  . Hiatal hernia 04/05/2018  . Patellar sleeve fracture, right, closed, initial encounter 04/24/2018  . Primary osteoarthritis 07/14/2018  . Gastroesophageal reflux disease with esophagitis 07/14/2018  . Fixed pupil of both eyes 07/18/2018  . Frequent headaches 07/18/2018  . Unsteady gait 07/18/2018  . Fatigue 12/11/2018   Resolved Ambulatory Problems    Diagnosis Date Noted  . HEMORRHOIDS, WITH BLEEDING 04/22/2008  . Acute maxillary sinusitis 04/10/2010  . Alcoholic fatty liver 68/04/7516  . RENAL INSUFFICIENCY, ACUTE 10/23/2008  . ROTATOR CUFF SYNDROME, RIGHT 04/01/2008  . ALLERGIC REACTION 12/24/2009  . ELEVATED BLOOD PRESSURE WITHOUT DIAGNOSIS OF HYPERTENSION 05/13/2010  . CHRONIC OBSTRUCTIVE PULMONARY DISEASE, ACUTE EXACERBATION 08/14/2010  . Recurrent acute sinusitis 09/09/2010  . Allergic dermatitis due to poison ivy 11/27/2010  . DERMATITIS, ATOPIC 01/15/2011  . Ganglion cyst of left foot 07/13/2013  . Chronic pain syndrome 12/05/2013  . Pyelonephritis 11/21/2017   Past Medical History:  Diagnosis Date  . Cervicalgia   . COPD (chronic obstructive pulmonary disease) ([place])   . DDD (degenerative disc disease), lumbar   . Diabetes mellitus   . Fatty liver   . Narcotic abuse ([place])   . Osteoarthritis   . Osteoporosis   . Rheumatoid arthritis ([place])   . Thyroid disease    Reviewed med, allergy, problem list.       Observations/Objective: No acute distress.  No labored breathing.  Dry/productive cough. Flat affect.   .. Today's Vitals   01/02/19 0945  Weight: 132 lb (59.9 kg)  Height: 5' 2.25" (1.581 m)   Body mass index is 23.95 kg/m.  Assessment and Plan: Marland KitchenMarland KitchenDiane was seen today for fatigue.  Diagnoses and all orders for this visit:  Fatigue, unspecified type  Lumbar degenerative disc disease -     gabapentin (NEURONTIN) 600 MG tablet; Take 1 tablet (600 mg total) by mouth 3 (three) times daily.  COPD (chronic obstructive pulmonary  disease) with chronic bronchitis (HCC) -     fluticasone furoate-vilanterol (BREO ELLIPTA) 100-25 MCG/INH AEPB; Inhale 1 puff into the lungs daily.  MDD (major depressive disorder), recurrent episode, mild (HCC) -     buPROPion (WELLBUTRIN XL) 150 MG 24 hr tablet; Take 1 tablet (150 mg total) by mouth every morning.   Unclear exactly what is causing this ongoing fatigue.  I do think her overall depression could be affecting her fatigue.  I would like to add Wellbutrin.  I also think her COPD could be worsening and causing her to feel more fatigued.  I am adding Breo to see if improving her lung function could help her energy.  Follow-up in 3 to 4 weeks.  Reviewed with patient her labs that essentially are very good.  Follow Up Instructions:    I discussed the assessment and treatment plan with the patient. The patient was provided an opportunity to ask questions and all were answered. The patient agreed with the plan and demonstrated an understanding of the instructions.   The patient was advised to call back or seek an in-person evaluation if the symptoms worsen or if the condition fails to improve as anticipated.  I provided 12 minutes of non-face-to-face time during this encounter.   Iran Planas, PA-C

## 2019-01-04 ENCOUNTER — Other Ambulatory Visit: Payer: Self-pay | Admitting: Physician Assistant

## 2019-01-09 ENCOUNTER — Encounter: Payer: Self-pay | Admitting: Family Medicine

## 2019-01-09 ENCOUNTER — Ambulatory Visit (INDEPENDENT_AMBULATORY_CARE_PROVIDER_SITE_OTHER): Payer: Medicare Other | Admitting: Family Medicine

## 2019-01-09 VITALS — BP 126/84 | Temp 98.4°F | Wt 132.0 lb

## 2019-01-09 DIAGNOSIS — I2510 Atherosclerotic heart disease of native coronary artery without angina pectoris: Secondary | ICD-10-CM

## 2019-01-09 DIAGNOSIS — R42 Dizziness and giddiness: Secondary | ICD-10-CM

## 2019-01-09 NOTE — Patient Instructions (Signed)
Thank you for coming in today.    

## 2019-01-09 NOTE — Progress Notes (Signed)
Virtual Visit  I connected with      [name]  by a telemedicine application and verified that I am speaking with the correct person using two identifiers.   I discussed the limitations of evaluation and management by telemedicine and the availability of in person appointments. The patient expressed understanding and agreed to proceed.  History of Present Illness: [name] is a 68 y.o. female who would like to discuss dizziness.  Patient has had episodes of dizzy room spinning sensations ongoing off and on for several weeks now.  She has been seen by PCP who thought her symptoms were likely BPPV.  She had relatively normal brain MRI in March.  She notes that she is tried some meclizine which helps a little.  She has not been able to follow-up yet for vestibular therapy.  She feels well otherwise.  She does note fatigue is currently being evaluated by PCP but otherwise no severe or significant changes in health.  She notes episodes of dizziness occur frequently through the day and lasts only about 4 to 5 seconds.  They tend to be associated with head positioning.  She denies lightheadedness or syncopal or near syncope sensations.  Observations/Objective: BP 126/84   Temp 98.4 F (36.9 C) (Oral)   Wt 132 lb (59.9 kg)   BMI 23.95 kg/m  Wt Readings from Last 5 Encounters:  01/09/19 132 lb (59.9 kg)  01/02/19 132 lb (59.9 kg)  12/26/18 132 lb (59.9 kg)  12/18/18 132 lb (59.9 kg)  12/11/18 132 lb (59.9 kg)   Exam: Normal Speech.    Lab and Radiology Results No results found for this or any previous visit (from the past 72 hour(s)). No results found.   Assessment and Plan: 68 y.o. female with dizziness likely vertigo.  Based on symptoms very likely BPPV.  However somewhat difficult to evaluate remotely.  Plan for trial of vestibular therapy.  Stressed the importance of vestibular therapy.  She expresses understanding agreement will attend physical therapy.  Use meclizine  sparingly intermittently.  Recheck in person if not improving.  PDMP not reviewed this encounter. No orders of the defined types were placed in this encounter.  No orders of the defined types were placed in this encounter.   Follow Up Instructions:    I discussed the assessment and treatment plan with the patient. The patient was provided an opportunity to ask questions and all were answered. The patient agreed with the plan and demonstrated an understanding of the instructions.   The patient was advised to call back or seek an in-person evaluation if the symptoms worsen or if the condition fails to improve as anticipated.  Time: 15 minutes of intraservice time, with >22 minutes of total time during today's visit.      Historical information moved to improve visibility of documentation.  Past Medical History:  Diagnosis Date  . CAD (coronary artery disease)   . Cervicalgia   . Chronic pain   . COPD (chronic obstructive pulmonary disease) ([place])   . DDD (degenerative disc disease), lumbar   . Diabetes mellitus   . Fatty liver   . Hyperlipidemia   . Narcotic abuse ([place])    history  . Osteoarthritis   . Osteoporosis   . Rheumatoid arthritis ([place])   . Thyroid disease    hypothyroid   Past Surgical History:  Procedure Laterality Date  . BREAST SURGERY     braest lump/ benign  . CHOLECYSTECTOMY    .  LUMBAR LAMINECTOMY  05/2010   Dr. Netta Corrigan  . TOTAL KNEE ARTHROPLASTY  2010   right   Social History   Tobacco Use  . Smoking status: Current Every Day Smoker    Packs/day: 1.00    Years: 43.00    Pack years: 43.00    Types: Cigarettes  . Smokeless tobacco: Never Used  . Tobacco comment: Has tried Chantix and Wellbutrin in the past  Substance Use Topics  . Alcohol use: Yes    Alcohol/week: 5.0 standard drinks    Types: 5 Standard drinks or equivalent per week    Comment: 1 beer per day   family history includes Cancer in her maternal grandmother; Cirrhosis in her  sister; Diabetes in her father; Heart disease (age of onset: 34) in her father and mother; Hyperlipidemia in her mother; Lymphoma in her mother; Rheum arthritis in her father and mother.  Medications: Current Outpatient Medications  Medication Sig Dispense Refill  . alendronate (FOSAMAX) 70 MG tablet TAKE ONE TABLET EVERY 7 DAYS WITH A FULL GLASS OF WATER ON AN EMPTY STOMACH 4 tablet 11  . ALPRAZolam (XANAX) 1 MG tablet Take 1 tablet (1 mg total) by mouth at bedtime as needed. 30 tablet 5  . aspirin EC 81 MG tablet Take 81 mg by mouth daily.    [name] atorvastatin (LIPITOR) 80 MG tablet TAKE ONE TABLET BY MOUTH EVERY DAY 90 tablet 2  . Buprenorphine HCl-Naloxone HCl 8-2 MG FILM PLACE ONE AND ONE-HALF UNDER THE TONGUE EVERY 24 HOURS    . Buprenorphine HCl-Naloxone HCl 8-2 MG FILM PLACE TWO FILMS UNDER THE TONGUE EVERY DAY    . buPROPion (WELLBUTRIN XL) 150 MG 24 hr tablet Take 1 tablet (150 mg total) by mouth every morning. 90 tablet 0  . cetirizine (ZYRTEC) 10 MG tablet Take 10 mg by mouth daily.    . Cholecalciferol (VITAMIN D3) 1000 units CAPS Take by mouth.    . desvenlafaxine (PRISTIQ) 100 MG 24 hr tablet Take 1 tablet (100 mg total) by mouth daily. 90 tablet 1  . diclofenac sodium (VOLTAREN) 1 % GEL Apply 4 g topically 4 (four) times daily. To affected joint. 100 g 1  . famotidine (PEPCID) 20 MG tablet Take 1 tablet (20 mg total) by mouth 2 (two) times daily. 60 tablet 5  . fluticasone furoate-vilanterol (BREO ELLIPTA) 100-25 MCG/INH AEPB Inhale 1 puff into the lungs daily. 1 each 11  . gabapentin (NEURONTIN) 600 MG tablet Take 1 tablet (600 mg total) by mouth 3 (three) times daily. 270 tablet 1  . hydroxychloroquine (PLAQUENIL) 200 MG tablet TK 1 T PO D    . levothyroxine (SYNTHROID) 137 MCG tablet Take 1 tablet (137 mcg total) by mouth daily before breakfast. 90 tablet 1  . lisinopril (PRINIVIL,ZESTRIL) 2.5 MG tablet Take 1 tablet (2.5 mg total) by mouth daily. 90 tablet 1  . meclizine  (ANTIVERT) 25 MG tablet Take 1 tablet (25 mg total) by mouth 3 (three) times daily as needed for dizziness. 30 tablet 0  . metFORMIN (GLUCOPHAGE) 500 MG tablet TAKE 1 TABLET(500 MG) BY MOUTH TWICE DAILY WITH A MEAL 180 tablet 1  . methotrexate (RHEUMATREX) 15 MG tablet Take 1 tablet (15 mg total) by mouth once a week. Caution: Chemotherapy. Protect from light. 4 tablet 1  . nitroGLYCERIN (NITROSTAT) 0.4 MG SL tablet Place 1 tablet (0.4 mg total) under the tongue every 5 (five) minutes as needed for chest pain. 10 tablet 0  . promethazine (PHENERGAN) 25  MG tablet Take 1 tablet (25 mg total) by mouth every 8 (eight) hours as needed for nausea or vomiting. 20 tablet 0  . traZODone (DESYREL) 150 MG tablet Take 1 tablet (150 mg total) by mouth at bedtime. 90 tablet 4  . Vitamin D, Ergocalciferol, (DRISDOL) 1.25 MG (50000 UT) CAPS capsule Take 1 capsule (50,000 Units total) by mouth every 7 (seven) days. 12 capsule 0   No current facility-administered medications for this visit.    Allergies  Allergen Reactions  . Codeine Palpitations and Other (See Comments)  . Naproxen Rash and Palpitations    GI upset  . Budesonide-Formoterol Fumarate Rash    REACTION: rash REACTION: rash REACTION: rash  . Duloxetine Hcl Other (See Comments)    No difference for pain or anti-depressant.  Other reaction(s): Other No difference for pain or anti-depressant. No difference for pain or anti-depressant.  . Fentanyl Other (See Comments)    REACTION: severe aggitation, mood changes Other reaction(s): Hallucination, Psychiatric Hallucinations Hallucinations REACTION: severe aggitation, mood changes   . Tramadol-Acetaminophen     Other reaction(s): Unknown  . Effexor Xr [Venlafaxine Hcl Er]     Felt more anxious or no difference.   . Fluticasone-Salmeterol   . Lisinopril Rash    Maybe we dont really know

## 2019-01-16 ENCOUNTER — Ambulatory Visit: Payer: Medicare Other | Admitting: Physical Therapy

## 2019-01-17 ENCOUNTER — Ambulatory Visit: Payer: Medicare Other

## 2019-02-09 ENCOUNTER — Ambulatory Visit (INDEPENDENT_AMBULATORY_CARE_PROVIDER_SITE_OTHER): Payer: Medicare Other | Admitting: Family Medicine

## 2019-02-09 ENCOUNTER — Ambulatory Visit (INDEPENDENT_AMBULATORY_CARE_PROVIDER_SITE_OTHER): Payer: Medicare Other

## 2019-02-09 ENCOUNTER — Other Ambulatory Visit: Payer: Self-pay

## 2019-02-09 ENCOUNTER — Encounter: Payer: Self-pay | Admitting: Family Medicine

## 2019-02-09 ENCOUNTER — Ambulatory Visit: Payer: Medicare Other | Admitting: Physician Assistant

## 2019-02-09 ENCOUNTER — Other Ambulatory Visit: Payer: Self-pay | Admitting: Physician Assistant

## 2019-02-09 VITALS — BP 177/74 | HR 90 | Temp 98.6°F | Ht 62.5 in | Wt 133.9 lb

## 2019-02-09 DIAGNOSIS — I2510 Atherosclerotic heart disease of native coronary artery without angina pectoris: Secondary | ICD-10-CM

## 2019-02-09 DIAGNOSIS — R0781 Pleurodynia: Secondary | ICD-10-CM | POA: Diagnosis not present

## 2019-02-09 DIAGNOSIS — F330 Major depressive disorder, recurrent, mild: Secondary | ICD-10-CM

## 2019-02-09 DIAGNOSIS — R079 Chest pain, unspecified: Secondary | ICD-10-CM | POA: Diagnosis not present

## 2019-02-09 NOTE — Progress Notes (Signed)
[name] is a 68 y.o. female who presents to [facility] today for right anterior rib pain.    Patient has a four-day history of pain in the right anterior inferior rib.  She does this is painful to touch and pain with deep inspiration.  She denies any cough shortness of breath fevers or chills.  She denies any change with eating or food.  No nausea vomiting or diarrhea.  In the past she had a somewhat similar pain that was due to gas.  She is tried some over-the-counter Gas-X which did not help.  Past surgical history significant for cholecystectomy. ROS as above:  Exam:  BP (!) 177/74    Pulse 90    Temp 98.6 F (37 C) (Oral)    Ht 5' 2.5" (1.588 m)    Wt 133 lb 14.4 oz (60.7 kg)    BMI 24.10 kg/m  Wt Readings from Last 5 Encounters:  02/09/19 133 lb 14.4 oz (60.7 kg)  01/09/19 132 lb (59.9 kg)  01/02/19 132 lb (59.9 kg)  12/26/18 132 lb (59.9 kg)  12/18/18 132 lb (59.9 kg)    Gen: Well NAD HEENT: EOMI,  MMM Lungs: Normal work of breathing. CTABL Heart: RRR no MRG Chest wall: Tender to palpation anterior inferior ribs/chest wall. Abd: NABS, Soft. Nondistended, Nontender no rebound or guarding abdomen exam. Exts: Brisk capillary refill, warm and well perfused.   Lab and Radiology Results Two-view chest x-ray images obtained today personally independently reviewed No fractures or consolidation present. Await formal radiology review   Assessment and Plan: 68 y.o. female with right anterior inferior rib pain.  Patient is quite tender in this region.  X-ray formal radiology review pending.  Plan for deep breathing exercises at home.  Recommend topical diclofenac gel for this region.  Recommend throat pillows for coughing or sneezing.  Patient is somewhat concerned and are convinced that it may be gas pain.  If that is the case she is already treated it  appropriately with watchful waiting and Gas-X.  Recheck if not improving. Prescription diclofenac gel.  PDMP not reviewed this encounter. Orders Placed This Encounter  Procedures   DG Chest 2 View    Order Specific Question:   Reason for exam:    Answer:   rib pain right anterior lower with decreased breath sounds right side    Order Specific Question:   Preferred imaging location?    Answer:   [facility]   No orders of the defined types were placed in this encounter.    Historical information moved to improve visibility of documentation.  Past Medical History:  Diagnosis Date   CAD (coronary artery disease)    Cervicalgia    Chronic pain    COPD (chronic obstructive pulmonary disease) (HCC)    DDD (degenerative disc disease), lumbar    Diabetes mellitus    Fatty liver    Hyperlipidemia    Narcotic abuse ([place])    history   Osteoarthritis    Osteoporosis    Rheumatoid arthritis ([place])    Thyroid disease    hypothyroid   Past Surgical History:  Procedure Laterality Date   BREAST SURGERY     braest lump/ benign   CHOLECYSTECTOMY     LUMBAR LAMINECTOMY  05/2010   Dr. Rushie Nyhan   TOTAL KNEE ARTHROPLASTY  2010   right   Social History   Tobacco Use   Smoking status: Current  Every Day Smoker    Packs/day: 1.00    Years: 43.00    Pack years: 43.00    Types: Cigarettes   Smokeless tobacco: Never Used   Tobacco comment: Has tried Chantix and Wellbutrin in the past  Substance Use Topics   Alcohol use: Yes    Alcohol/week: 5.0 standard drinks    Types: 5 Standard drinks or equivalent per week    Comment: 1 beer per day   family history includes Cancer in her maternal grandmother; Cirrhosis in her sister; Diabetes in her father; Heart disease (age of onset: 31) in her father and mother; Hyperlipidemia in her mother; Lymphoma in her mother; Rheum arthritis in her father and mother.  Medications: Current Outpatient Medications    Medication Sig Dispense Refill   alendronate (FOSAMAX) 70 MG tablet TAKE ONE TABLET EVERY 7 DAYS WITH A FULL GLASS OF WATER ON AN EMPTY STOMACH 4 tablet 11   ALPRAZolam (XANAX) 1 MG tablet Take 1 tablet (1 mg total) by mouth at bedtime as needed. 30 tablet 5   aspirin EC 81 MG tablet Take 81 mg by mouth daily.     atorvastatin (LIPITOR) 80 MG tablet TAKE ONE TABLET BY MOUTH EVERY DAY 90 tablet 2   Buprenorphine HCl-Naloxone HCl 8-2 MG FILM PLACE ONE AND ONE-HALF UNDER THE TONGUE EVERY 24 HOURS     Buprenorphine HCl-Naloxone HCl 8-2 MG FILM PLACE TWO FILMS UNDER THE TONGUE EVERY DAY     buPROPion (WELLBUTRIN XL) 150 MG 24 hr tablet Take 1 tablet (150 mg total) by mouth every morning. 90 tablet 0   cetirizine (ZYRTEC) 10 MG tablet Take 10 mg by mouth daily.     Cholecalciferol (VITAMIN D3) 1000 units CAPS Take by mouth.     desvenlafaxine (PRISTIQ) 100 MG 24 hr tablet Take 1 tablet (100 mg total) by mouth daily. 90 tablet 1   diclofenac sodium (VOLTAREN) 1 % GEL Apply 4 g topically 4 (four) times daily. To affected joint. 100 g 1   famotidine (PEPCID) 20 MG tablet Take 1 tablet (20 mg total) by mouth 2 (two) times daily. 60 tablet 5   fluticasone furoate-vilanterol (BREO ELLIPTA) 100-25 MCG/INH AEPB Inhale 1 puff into the lungs daily. 1 each 11   gabapentin (NEURONTIN) 600 MG tablet Take 1 tablet (600 mg total) by mouth 3 (three) times daily. 270 tablet 1   hydroxychloroquine (PLAQUENIL) 200 MG tablet TK 1 T PO D     levothyroxine (SYNTHROID) 137 MCG tablet Take 1 tablet (137 mcg total) by mouth daily before breakfast. 90 tablet 1   lisinopril (PRINIVIL,ZESTRIL) 2.5 MG tablet Take 1 tablet (2.5 mg total) by mouth daily. 90 tablet 1   meclizine (ANTIVERT) 25 MG tablet Take 1 tablet (25 mg total) by mouth 3 (three) times daily as needed for dizziness. 30 tablet 0   metFORMIN (GLUCOPHAGE) 500 MG tablet TAKE 1 TABLET(500 MG) BY MOUTH TWICE DAILY WITH A MEAL 180 tablet 1    methotrexate (RHEUMATREX) 15 MG tablet Take 1 tablet (15 mg total) by mouth once a week. Caution: Chemotherapy. Protect from light. 4 tablet 1   nitroGLYCERIN (NITROSTAT) 0.4 MG SL tablet Place 1 tablet (0.4 mg total) under the tongue every 5 (five) minutes as needed for chest pain. 10 tablet 0   promethazine (PHENERGAN) 25 MG tablet Take 1 tablet (25 mg total) by mouth every 8 (eight) hours as needed for nausea or vomiting. 20 tablet 0   traZODone (DESYREL) 150 MG tablet  Take 1 tablet (150 mg total) by mouth at bedtime. 90 tablet 4   Vitamin D, Ergocalciferol, (DRISDOL) 1.25 MG (50000 UT) CAPS capsule Take 1 capsule (50,000 Units total) by mouth every 7 (seven) days. 12 capsule 0   No current facility-administered medications for this visit.    Allergies  Allergen Reactions   Codeine Palpitations and Other (See Comments)   Naproxen Rash and Palpitations    GI upset   Budesonide-Formoterol Fumarate Rash    REACTION: rash REACTION: rash REACTION: rash   Duloxetine Hcl Other (See Comments)    No difference for pain or anti-depressant.  Other reaction(s): Other No difference for pain or anti-depressant. No difference for pain or anti-depressant.   Fentanyl Other (See Comments)    REACTION: severe aggitation, mood changes Other reaction(s): Hallucination, Psychiatric Hallucinations Hallucinations REACTION: severe aggitation, mood changes    Tramadol-Acetaminophen     Other reaction(s): Unknown   Effexor Xr [Venlafaxine Hcl Er]     Felt more anxious or no difference.    Fluticasone-Salmeterol    Lisinopril Rash    Maybe we dont really know     Discussed warning signs or symptoms. Please see discharge instructions. Patient expresses understanding.

## 2019-02-09 NOTE — Patient Instructions (Signed)
Thank you for coming in today. I think this is probably rib pain. But gas pain is a possibility.  Remember to take big deep breaths every 30 mins while awake.  Recommend using a throw pillow for cough, sneeze or laugh.  Recheck if not improving.  Consider diclofenac gel topically at the ribs.    Chest Wall Pain Chest wall pain is pain in or around the bones and muscles of your chest. Chest wall pain may be caused by:  An injury.  Coughing a lot.  Using your chest and arm muscles too much. Sometimes, the cause may not be known. This pain may take a few weeks or longer to get better. Follow these instructions at home: Managing pain, stiffness, and swelling If told, put ice on the painful area:  Put ice in a plastic bag.  Place a towel between your skin and the bag.  Leave the ice on for 20 minutes, 2-3 times a day.  Activity  Rest as told by your doctor.  Avoid doing things that cause pain. This includes lifting heavy items.  Ask your doctor what activities are safe for you. General instructions   Take over-the-counter and prescription medicines only as told by your doctor.  Do not use any products that contain nicotine or tobacco, such as cigarettes, e-cigarettes, and chewing tobacco. If you need help quitting, ask your doctor.  Keep all follow-up visits as told by your doctor. This is important. Contact a doctor if:  You have a fever.  Your chest pain gets worse.  You have new symptoms. Get help right away if:  You feel sick to your stomach (nauseous) or you throw up (vomit).  You feel sweaty or light-headed.  You have a cough with mucus from your lungs (sputum) or you cough up blood.  You are short of breath. These symptoms may be an emergency. Do not wait to see if the symptoms will go away. Get medical help right away. Call your local emergency services (911 in the [place] Do not drive yourself to the hospital. Summary  Chest wall pain is pain in or  around the bones and muscles of your chest.  It may be treated with ice, rest, and medicines. Your condition may also get better if you avoid doing things that cause pain.  Contact a doctor if you have a fever, chest pain that gets worse, or new symptoms.  Get help right away if you feel light-headed or you get short of breath. These symptoms may be an emergency. This information is not intended to replace advice given to you by your health care provider. Make sure you discuss any questions you have with your health care provider. Document Released: 11/03/2007 Document Revised: 11/17/2017 Document Reviewed: 11/17/2017 Elsevier Patient Education  2020 [employer].

## 2019-02-10 ENCOUNTER — Other Ambulatory Visit: Payer: Self-pay | Admitting: Physician Assistant

## 2019-02-10 DIAGNOSIS — E559 Vitamin D deficiency, unspecified: Secondary | ICD-10-CM

## 2019-02-12 ENCOUNTER — Other Ambulatory Visit: Payer: Self-pay

## 2019-02-12 ENCOUNTER — Encounter: Payer: Self-pay | Admitting: Physician Assistant

## 2019-02-12 ENCOUNTER — Ambulatory Visit (INDEPENDENT_AMBULATORY_CARE_PROVIDER_SITE_OTHER): Payer: Medicare Other | Admitting: Physician Assistant

## 2019-02-12 VITALS — BP 152/71 | HR 88 | Ht 62.25 in | Wt 134.0 lb

## 2019-02-12 DIAGNOSIS — I2510 Atherosclerotic heart disease of native coronary artery without angina pectoris: Secondary | ICD-10-CM | POA: Diagnosis not present

## 2019-02-12 DIAGNOSIS — E039 Hypothyroidism, unspecified: Secondary | ICD-10-CM

## 2019-02-12 DIAGNOSIS — I10 Essential (primary) hypertension: Secondary | ICD-10-CM | POA: Diagnosis not present

## 2019-02-12 DIAGNOSIS — F419 Anxiety disorder, unspecified: Secondary | ICD-10-CM

## 2019-02-12 DIAGNOSIS — Z23 Encounter for immunization: Secondary | ICD-10-CM

## 2019-02-12 MED ORDER — ALPRAZOLAM 1 MG PO TABS: 1.0000 mg | ORAL_TABLET | Freq: Every evening | ORAL | 5 refills | Status: DC | PRN

## 2019-02-13 ENCOUNTER — Encounter: Payer: Self-pay | Admitting: Physician Assistant
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
## 2019-02-14 ENCOUNTER — Ambulatory Visit: Payer: Medicare Other

## 2019-02-16 ENCOUNTER — Telehealth: Payer: Self-pay | Admitting: Neurology

## 2019-02-16 DIAGNOSIS — M5136 Other intervertebral disc degeneration, lumbar region: Secondary | ICD-10-CM

## 2019-02-16 MED ORDER — GABAPENTIN 600 MG PO TABS: 600.0000 mg | ORAL_TABLET | Freq: Three times a day (TID) | ORAL | 0 refills | Status: DC

## 2019-02-16 NOTE — Telephone Encounter (Signed)
Patient left vm stating her gabapentin is now missing. She is asking if we can send in early refill. Please advise.

## 2019-02-16 NOTE — Telephone Encounter (Signed)
RX sent to pharmacy with note that okay to fill early.

## 2019-02-16 NOTE — Telephone Encounter (Signed)
Ok to send

## 2019-02-22 IMAGING — DX DG SHOULDER 2+V*L*
3 series · 3 of 3 positions shown · non-contrast
Comparison: None.

CLINICAL DATA: Status post fall

EXAM:
LEFT SHOULDER - 2+ VIEW

[shoulder grashey]
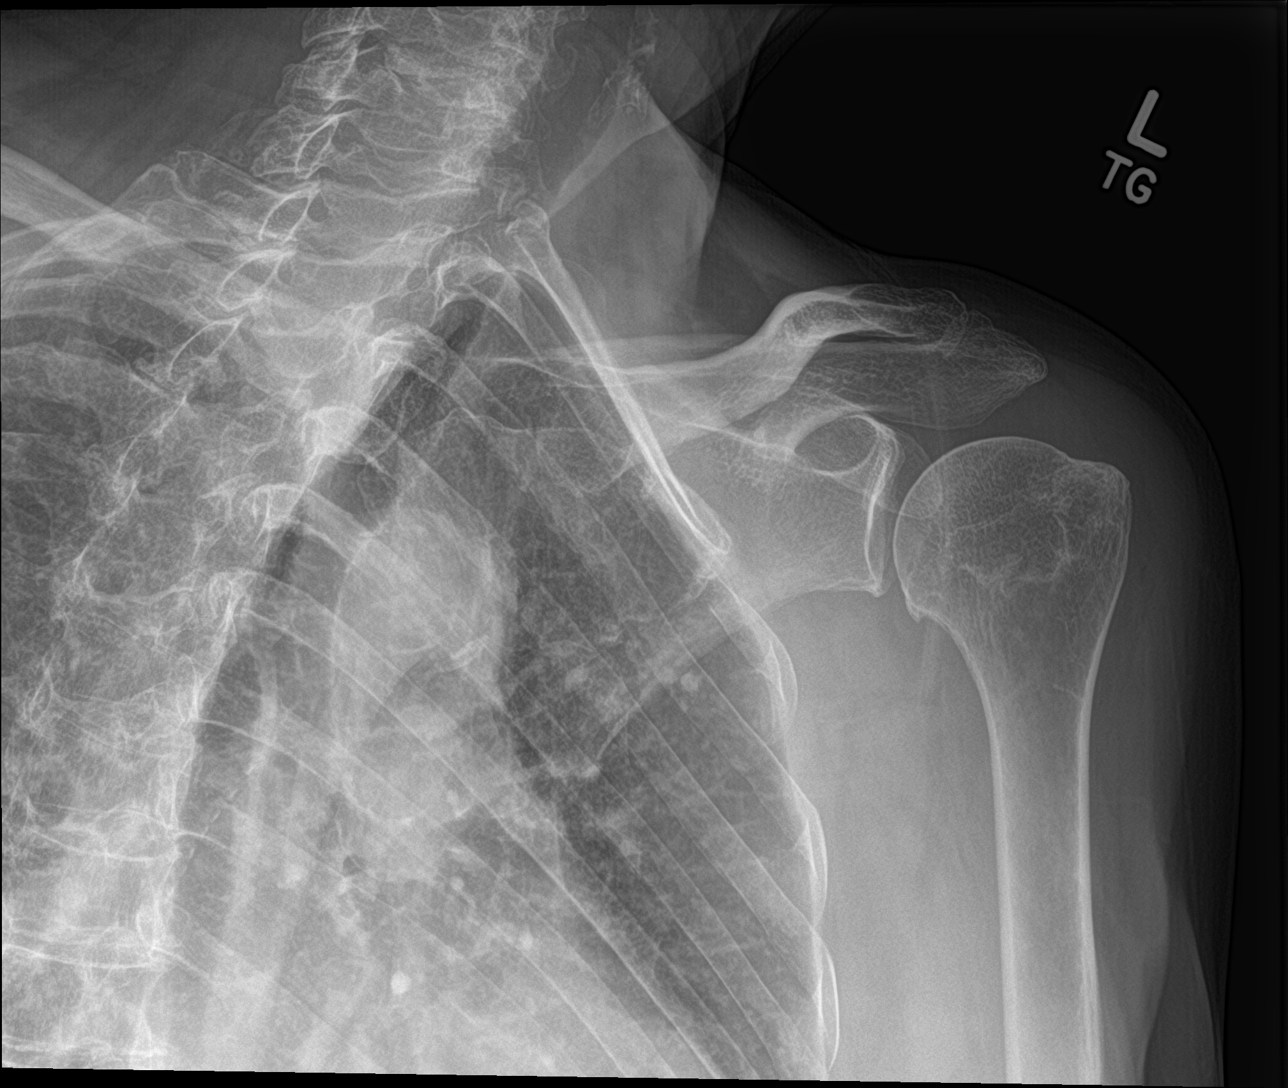

[shoulder y view]
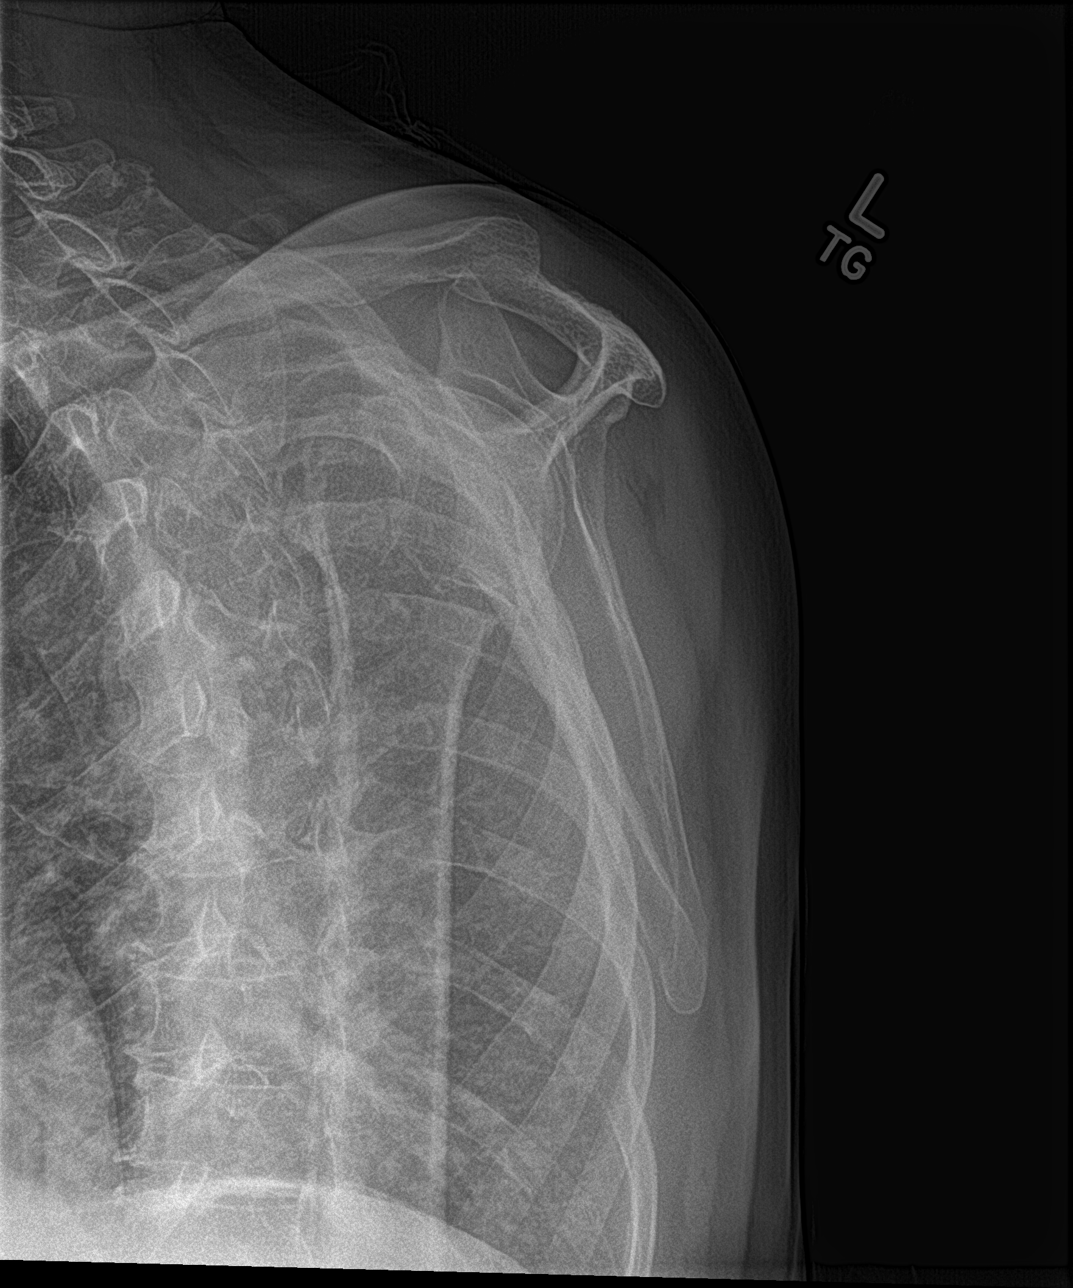

[shoulder axillary]
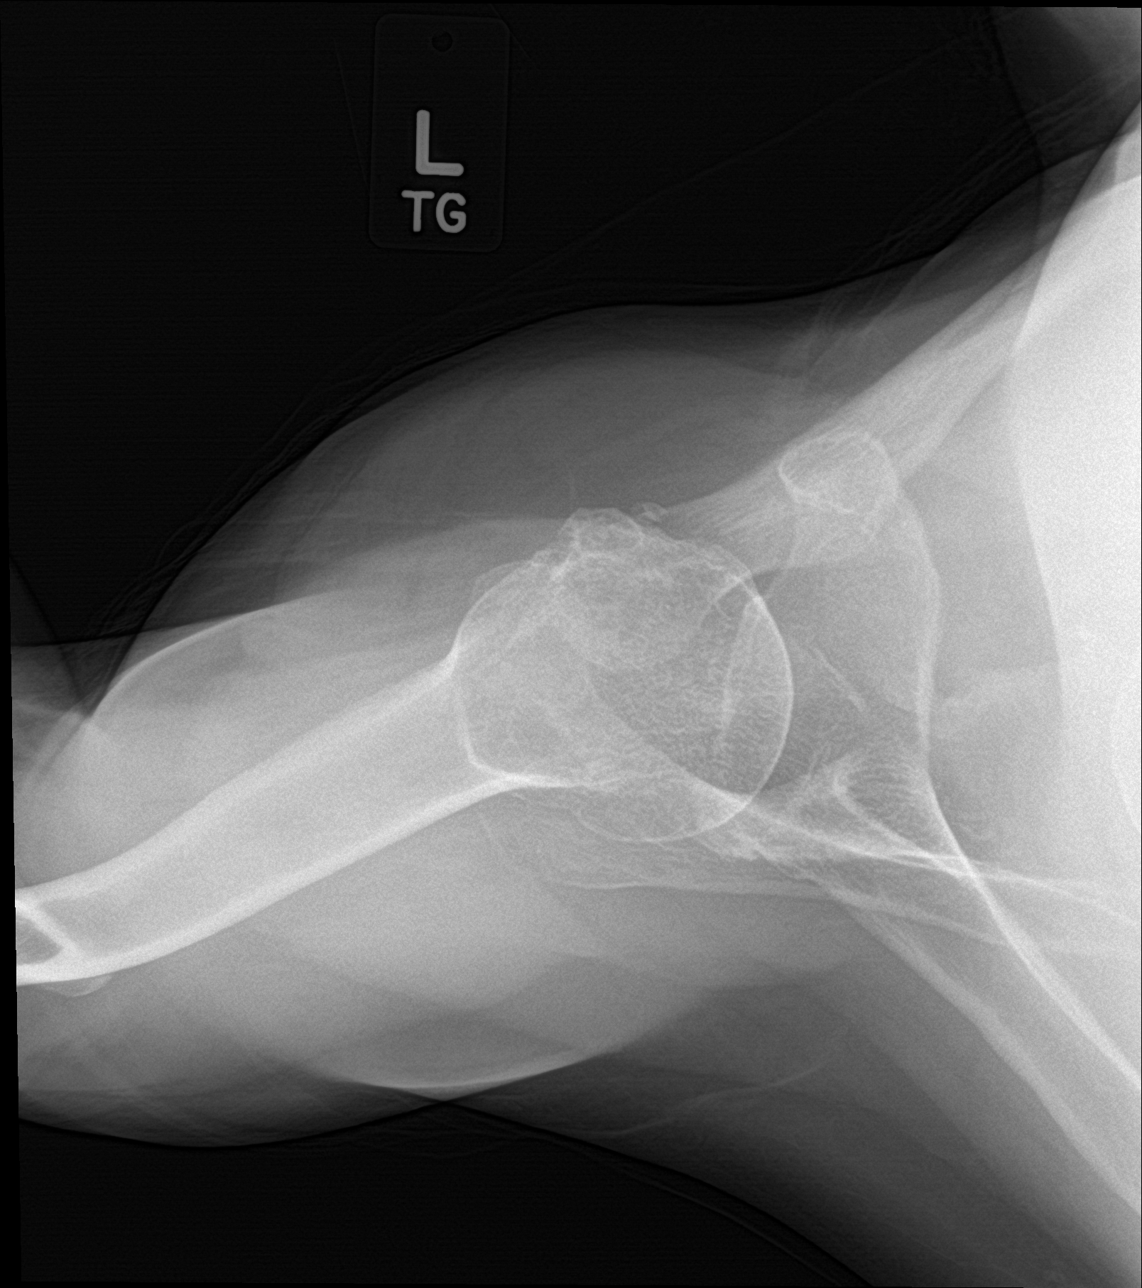

[3 of 3 positions shown; findings below may reference images not displayed]

FINDINGS: There is no fracture or dislocation. There is mild osteoarthritis of
the left glenohumeral joint. There are mild degenerative changes of
the acromioclavicular joint.
IMPRESSION: No acute osseous injury of the left shoulder.

## 2019-02-26 ENCOUNTER — Other Ambulatory Visit: Payer: Self-pay | Admitting: Neurology

## 2019-02-26 DIAGNOSIS — R42 Dizziness and giddiness: Secondary | ICD-10-CM

## 2019-02-26 MED ORDER — MECLIZINE HCL 25 MG PO TABS: 25.0000 mg | ORAL_TABLET | Freq: Three times a day (TID) | ORAL | 0 refills | Status: DC | PRN

## 2019-02-26 NOTE — Telephone Encounter (Signed)
Patient left vm stating having some dizziness again. Wanted to know if we could send in a refill of Mecl[facility]ine to [facility]. Please advise.

## 2019-03-09 ENCOUNTER — Ambulatory Visit: Payer: Medicare Other

## 2019-03-09 ENCOUNTER — Telehealth: Payer: Self-pay | Admitting: Neurology

## 2019-03-09 NOTE — Telephone Encounter (Signed)
LMOM letting patient know last a1c drawn 12/13/2018 and insurance only covers every 90 days. Need to move appt next week til after 03/15/2019. Awaiting call back.

## 2019-03-13 ENCOUNTER — Telehealth: Payer: Self-pay | Admitting: Physician Assistant

## 2019-03-13 ENCOUNTER — Ambulatory Visit: Payer: Medicare Other | Admitting: Physician Assistant

## 2019-03-13 NOTE — Telephone Encounter (Signed)
Patient called, stating that she needs to be seen sooner than her scheduled appointment on Monday 03/19/2019 and mentioned she was needing blood work done. No appropriate time slots available. Please Advise.

## 2019-03-13 NOTE — Telephone Encounter (Signed)
7:50 tomorrow or ok to use acute

## 2019-03-13 NOTE — Telephone Encounter (Signed)
Appointment has been made for 7:50AM on 03/14/2019.

## 2019-03-14 ENCOUNTER — Ambulatory Visit (INDEPENDENT_AMBULATORY_CARE_PROVIDER_SITE_OTHER): Payer: Medicare Other

## 2019-03-14 ENCOUNTER — Ambulatory Visit (INDEPENDENT_AMBULATORY_CARE_PROVIDER_SITE_OTHER): Payer: Medicare Other | Admitting: Physician Assistant

## 2019-03-14 ENCOUNTER — Other Ambulatory Visit: Payer: Self-pay

## 2019-03-14 VITALS — BP 137/76 | HR 109 | Ht 63.0 in | Wt 134.0 lb

## 2019-03-14 DIAGNOSIS — R112 Nausea with vomiting, unspecified: Secondary | ICD-10-CM

## 2019-03-14 DIAGNOSIS — I2510 Atherosclerotic heart disease of native coronary artery without angina pectoris: Secondary | ICD-10-CM

## 2019-03-14 DIAGNOSIS — E119 Type 2 diabetes mellitus without complications: Secondary | ICD-10-CM

## 2019-03-14 DIAGNOSIS — K838 Other specified diseases of biliary tract: Secondary | ICD-10-CM | POA: Diagnosis not present

## 2019-03-14 DIAGNOSIS — R10826 Epigastric rebound abdominal tenderness: Secondary | ICD-10-CM | POA: Diagnosis not present

## 2019-03-14 DIAGNOSIS — E039 Hypothyroidism, unspecified: Secondary | ICD-10-CM

## 2019-03-14 DIAGNOSIS — F330 Major depressive disorder, recurrent, mild: Secondary | ICD-10-CM | POA: Diagnosis not present

## 2019-03-14 DIAGNOSIS — I10 Essential (primary) hypertension: Secondary | ICD-10-CM | POA: Diagnosis not present

## 2019-03-14 DIAGNOSIS — F419 Anxiety disorder, unspecified: Secondary | ICD-10-CM

## 2019-03-14 DIAGNOSIS — F331 Major depressive disorder, recurrent, moderate: Secondary | ICD-10-CM | POA: Diagnosis not present

## 2019-03-14 LAB — POCT GLYCOSYLATED HEMOGLOBIN (HGB A1C): Hemoglobin A1C: 6.2 % — AB (ref 4.0–5.6)

## 2019-03-14 MED ORDER — METFORMIN HCL 500 MG PO TABS: ORAL_TABLET | ORAL | 3 refills | Status: DC

## 2019-03-14 MED ORDER — DESVENLAFAXINE SUCCINATE ER 100 MG PO TB24: 100.0000 mg | ORAL_TABLET | Freq: Every day | ORAL | 3 refills | Status: DC

## 2019-03-14 MED ORDER — BUPROPION HCL ER (XL) 150 MG PO TB24: ORAL_TABLET | ORAL | 3 refills | Status: DC

## 2019-03-14 NOTE — Progress Notes (Signed)
Subjective:    Patient ID: [name], female    DOB: 09/04/1950, 68 y.o.   MRN: [id number]  HPI Pt is a 68 yo female with CAD, HTN, COPD, GERD, hypothyroidism, RA, T2DM, GAD, MDD who presents to the clinic for 3 month follow up.   Pt today is doing ok. She is not checking her sugars. She denies any open sores or wounds. She does have intermittent nausea and vomiting. Denies any melena or hematochezia.   She is taking her levothyroxine daily. No problems or concerns.   Feels like she is breathing pretty good right now. No significant SOB. No edema. Pt continues to smoke daily.   .. Active Ambulatory Problems    Diagnosis Date Noted  . DM (diabetes mellitus) ([place]) 06/05/2007  . Mixed hyperlipidemia 09/28/2007  . HYPERCALCEMIA 11/13/2009  . MDD (major depressive disorder), recurrent episode, mild ([place]) 09/28/2007  . Chronic pain syndrome 06/05/2007  . FATTY LIVER DISEASE 09/28/2007  . ARTHRITIS, ACROMIOCLAVICULAR 07/31/2008  . SPINAL STENOSIS IN CERVICAL REGION 07/31/2008  . OSTEOPOROSIS 10/17/2007  . Elevated liver function tests 09/28/2007  . Allergic rhinitis 10/06/2010  . Chronic headache 04/03/2011  . Tobacco use 04/03/2011  . Chronic pain 04/03/2011  . Benign hypertension 04/03/2011  . Sacroiliitis ([place]) 09/14/2011  . Arthritis of carpometacarpal joint 02/14/2012  . Diabetes type 2, controlled ([place]) 03/14/2013  . Hypothyroidism 03/14/2013  . Trochanteric bursitis of right hip 05/04/2013  . Anxiety state 05/16/2013  . Ganglion cyst of wrist 07/13/2013  . Acute pain of left shoulder 09/23/2013  . COLD (chronic obstructive lung disease) ([place]) 12/06/2013  . Primary osteoarthritis of left wrist 12/10/2014  . Hyperlipidemia 01/22/2015  . COPD (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 ([place]) 03/05/2015  . Myocardial infarct ([place]) 03/09/2017  . CAD (coronary artery disease) 03/09/2017  . Rheumatoid arthritis involving both hands ([place]) 03/09/2017  .  Rotator cuff tendonitis, left 03/10/2017  . Anxiety 03/12/2017  . Left upper arm swelling secondary to rotator cuff tear and fluid tracking down biceps tendon sheath 04/14/2017  . Common bile duct dilatation 04/27/2017  . Lumbar degenerative disc disease 05/13/2017  . Abnormal finding on GI tract imaging 05/25/2017  . Chronic back pain 02/27/2016  . Atherosclerosis of native coronary artery of native heart without angina pectoris 03/16/2016  . Bulging lumbar disc 02/27/2016  . RA (rheumatoid arthritis) ([place]) 07/06/2016  . Acquired hypothyroidism 12/01/2015  . Moderate episode of recurrent major depressive disorder ([place]) 07/24/2017  . Bilateral calf pain 07/24/2017  . Memory changes 07/24/2017  . Bilateral cataracts 09/07/2017  . Dry eye syndrome of bilateral lacrimal glands 09/07/2017  . Positive colorectal cancer screening using Cologuard test 10/07/2017  . Microalbuminuria 10/07/2017  . Weakness of both lower extremities 10/07/2017  . History of diverticulitis 11/21/2017  . Non-intractable vomiting with nausea 11/21/2017  . Left lower quadrant guarding 11/21/2017  . Generalized abdominal pain 11/21/2017  . Vasovagal syncope 12/16/2017  . Upper abdominal pain 03/10/2018  . Vitamin D deficiency 04/05/2018  . Primary insomnia 04/05/2018  . Hiatal hernia 04/05/2018  . Patellar sleeve fracture, right, closed, initial encounter 04/24/2018  . Primary osteoarthritis 07/14/2018  . Gastroesophageal reflux disease with esophagitis 07/14/2018  . Fixed pupil of both eyes 07/18/2018  . Frequent headaches 07/18/2018  . Unsteady gait 07/18/2018  . Fatigue 12/11/2018   Resolved Ambulatory Problems    Diagnosis Date Noted  . HEMORRHOIDS, WITH BLEEDING 04/22/2008  . Acute maxillary sinusitis 04/10/2010  . Alcoholic fatty liver 35/57/3220  .  RENAL INSUFFICIENCY, ACUTE 10/23/2008  . ROTATOR CUFF SYNDROME, RIGHT 04/01/2008  . ALLERGIC REACTION 12/24/2009  . ELEVATED BLOOD PRESSURE WITHOUT  DIAGNOSIS OF HYPERTENSION 05/13/2010  . CHRONIC OBSTRUCTIVE PULMONARY DISEASE, ACUTE EXACERBATION 08/14/2010  . Recurrent acute sinusitis 09/09/2010  . Allergic dermatitis due to poison ivy 11/27/2010  . DERMATITIS, ATOPIC 01/15/2011  . Ganglion cyst of left foot 07/13/2013  . Chronic pain syndrome 12/05/2013  . Pyelonephritis 11/21/2017   Past Medical History:  Diagnosis Date  . Cervicalgia   . COPD (chronic obstructive pulmonary disease) (HCC)   . DDD (degenerative disc disease), lumbar   . Diabetes mellitus   . Fatty liver   . Narcotic abuse (HCC)   . Osteoarthritis   . Osteoporosis   . Rheumatoid arthritis (HCC)   . Thyroid disease        Review of Systems    see HPI.  Objective:   Physical Exam Vitals signs reviewed.  HENT:     Head: Normocephalic.  Cardiovascular:     Rate and Rhythm: Regular rhythm. Tachycardia present.  Pulmonary:     Effort: Pulmonary effort is normal.     Breath sounds: Normal breath sounds.  Abdominal:     General: Bowel sounds are normal. There is distension.     Palpations: Abdomen is soft.     Tenderness: There is abdominal tenderness. There is no right CVA tenderness, left CVA tenderness, guarding or rebound.     Comments: Diffuse tenderness in epigastrica area to palpation.   Neurological:     General: No focal deficit present.     Mental Status: She is alert and oriented to person, place, and time.  Psychiatric:        Mood and Affect: Mood normal.        Behavior: Behavior normal.       .. Depression screen [facility] 2/9 09/19/2018 01/25/2018 03/09/2017  Decreased Interest 0 0 0  Down, Depressed, Hopeless 0 0 1  PHQ - 2 Score 0 0 1  Some recent data might be hidden   .[name] GAD 7 : Generalized Anxiety Score 09/19/2018  Nervous, Anxious, on Edge 1  Control/stop worrying 0  Worry too much - different things 0  Trouble relaxing 1  Restless 0  Easily annoyed or irritable 0  Afraid - awful might happen 0  Total GAD 7 Score 2   Anxiety Difficulty Not difficult at all        Assessment & Plan:  Marland KitchenMarland KitchenDiane was seen today for diabetes.  Diagnoses and all orders for this visit:  Controlled type 2 diabetes mellitus without complication, without long-term current use of insulin (HCC) -     POCT glycosylated hemoglobin (Hb A1C) -     metFORMIN (GLUCOPHAGE) 500 MG tablet; TAKE 1 TABLET(500 MG) BY MOUTH TWICE DAILY WITH A MEAL -     TSH -     COMPLETE METABOLIC PANEL WITH GFR  Moderate episode of recurrent major depressive disorder (HCC) -     desvenlafaxine (PRISTIQ) 100 MG 24 hr tablet; Take 1 tablet (100 mg total) by mouth daily. -     TSH -     COMPLETE METABOLIC PANEL WITH GFR  MDD (major depressive disorder), recurrent episode, mild (HCC) -     buPROPion (WELLBUTRIN XL) 150 MG 24 hr tablet; TAKE 1 TABLET BY MOUTH EVERY DAY IN THE MORNING -     TSH -     COMPLETE METABOLIC PANEL WITH GFR  Epigastric abdominal tenderness with rebound  tenderness -     US Abdomen Complete -     TSH -     COMPLETE METABOLIC PANEL WITH GFR  Non-intractable vomiting with nausea, unspecified vomiting type -     US Abdomen Complete -     TSH -     COMPLETE METABOLIC PANEL WITH GFR -     CT PANCREAS ABDOMEN W WO CONTRAST  Anxiety -     TSH -     COMPLETE METABOLIC PANEL WITH GFR  Primary hypothyroidism -     TSH -     COMPLETE METABOLIC PANEL WITH GFR  Hypertension, essential -     TSH -     COMPLETE METABOLIC PANEL WITH GFR  Dilated bile duct -     CT PANCREAS ABDOMEN W WO CONTRAST    Lab Results  Component Value Date   HGBA1C 6.2 (A) 03/14/2019   A!C normal.  Stay on current medications.  On STATIN.  On ACE.  Eye exam UTD. Vaccines UTD.   Pt due for labs. Ordered today. Medications refilled accordingly.   Mood stable-refills given.   Pt has intermittent nausea and vomiting. Looking over EMR see 2018 before she moved abdominal u/s showed dilated bilary duct. Never was evaluated anymore. Will order  today.   Addendum:  1. Increasing biliary ductal distension, of uncertain significance given that the patient was not NPO. However, given the presence of intrahepatic biliary ductal dilation suggested on the current study and the increase in main duct caliber pancreatic protocol CT or MRI may be helpful to exclude an obstructing lesion the pancreatic head or intraductal stone. 2. Aortic atherosclerosis and mild fusiform dilation.  Ordered CT of pancreas to evaluate.

## 2019-03-15 LAB — COMPLETE METABOLIC PANEL WITH GFR
AG Ratio: 1.6 (calc) (ref 1.0–2.5)
ALT: 8 U/L (ref 6–29)
AST: 12 U/L (ref 10–35)
Albumin: 4.1 g/dL (ref 3.6–5.1)
Alkaline phosphatase (APISO): 77 U/L (ref 37–153)
BUN: 7 mg/dL (ref 7–25)
CO2: 28 mmol/L (ref 20–32)
Calcium: 9.8 mg/dL (ref 8.6–10.4)
Chloride: 105 mmol/L (ref 98–110)
Creat: 0.74 mg/dL (ref 0.50–0.99)
GFR, Est African American: 96 mL/min/{1.73_m2} (ref >=60)
GFR, Est Non African American: 83 mL/min/{1.73_m2} (ref >=60)
Globulin: 2.5 g/dL (calc) (ref 1.9–3.7)
Glucose, Bld: 143 mg/dL — ABNORMAL HIGH (ref 65–99)
Potassium: 4 mmol/L (ref 3.5–5.3)
Sodium: 141 mmol/L (ref 135–146)
Total Bilirubin: 0.3 mg/dL (ref 0.2–1.2)
Total Protein: 6.6 g/dL (ref 6.1–8.1)

## 2019-03-15 LAB — TSH: TSH: 0.08 mIU/L — ABNORMAL LOW (ref 0.40–4.50)

## 2019-03-15 NOTE — Progress Notes (Signed)
Your TSH is now too supplemented. Confirm my understanding is right: you ARE on 137mg  daily before this you WERE on 138mcg? So we need to find something in the middle.   Kidney, liver look great.

## 2019-03-15 NOTE — Progress Notes (Signed)
Call pt: dilation of the biliary duct continues to be distended. We need to evaluate with better imaging. Ok to order?

## 2019-03-16 NOTE — Progress Notes (Signed)
CT pancreas ordered.

## 2019-03-16 NOTE — Progress Notes (Signed)
Decrease to only one day a week taking 1 and 1/2 tablet.

## 2019-03-19 ENCOUNTER — Ambulatory Visit: Payer: Medicare Other | Admitting: Physician Assistant

## 2019-03-19 ENCOUNTER — Encounter: Payer: Self-pay | Admitting: Physician Assistant

## 2019-03-19 ENCOUNTER — Other Ambulatory Visit: Payer: Medicare Other

## 2019-03-19 DIAGNOSIS — M0579 Rheumatoid arthritis with rheumatoid factor of multiple sites without organ or systems involvement: Secondary | ICD-10-CM | POA: Diagnosis not present

## 2019-03-19 DIAGNOSIS — Z79899 Other long term (current) drug therapy: Secondary | ICD-10-CM | POA: Diagnosis not present

## 2019-03-21 ENCOUNTER — Ambulatory Visit (INDEPENDENT_AMBULATORY_CARE_PROVIDER_SITE_OTHER): Payer: Medicare Other

## 2019-03-21 ENCOUNTER — Other Ambulatory Visit: Payer: Self-pay

## 2019-03-21 ENCOUNTER — Telehealth: Payer: Self-pay | Admitting: Neurology

## 2019-03-21 DIAGNOSIS — Z1231 Encounter for screening mammogram for malignant neoplasm of breast: Secondary | ICD-10-CM

## 2019-03-21 NOTE — Telephone Encounter (Signed)
Take 1/2 tablet for 2 weeks then stop.

## 2019-03-21 NOTE — Telephone Encounter (Signed)
Patient left vm concerned that she is on two different antidepressants and questioning the need for both. She is currently taking Pristiq and Wellbutrin. Please advise.

## 2019-03-21 NOTE — Telephone Encounter (Signed)
On is SSNRI and NDRI so different classes of medication. If her mood is pretty controlled we could start tapering off wellbutrin and see what happens.

## 2019-03-21 NOTE — Telephone Encounter (Signed)
Spoke with patient. She states mood controlled. She would like to start tapering off Wellbutrin. Please advise on taper schedule.

## 2019-03-22 ENCOUNTER — Encounter: Payer: Self-pay | Admitting: Family Medicine

## 2019-03-22 ENCOUNTER — Ambulatory Visit (INDEPENDENT_AMBULATORY_CARE_PROVIDER_SITE_OTHER): Payer: Medicare Other | Admitting: Family Medicine

## 2019-03-22 VITALS — BP 147/73 | HR 105 | Temp 98.4°F | Wt 134.0 lb

## 2019-03-22 DIAGNOSIS — I2510 Atherosclerotic heart disease of native coronary artery without angina pectoris: Secondary | ICD-10-CM | POA: Diagnosis not present

## 2019-03-22 DIAGNOSIS — M5416 Radiculopathy, lumbar region: Secondary | ICD-10-CM | POA: Diagnosis not present

## 2019-03-22 MED ORDER — PREDNISONE 50 MG PO TABS: 50.0000 mg | ORAL_TABLET | Freq: Every day | ORAL | 0 refills | Status: DC

## 2019-03-22 NOTE — Telephone Encounter (Signed)
Patient made aware. She will try and report back.

## 2019-03-22 NOTE — Progress Notes (Signed)
[name] is a 68 y.o. female who presents to [facility] today for left leg pain.  Patient notes pain in the lateral calf to anterior thigh present over the past week or so.  Pain occurred without injury.  She notes the area is not tender or swollen but is generally painful.  Pain is generally worse during the day and better somewhat at rest.  She has not tried much treatment for this yet.  She does take gabapentin regularly.  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  Patient does have a history of rheumatoid arthritis managed by rheumatology Dr. Allena Katz [facility] [place]).  Seen recently.  Medications include methotrexate, Plaquenil, prednisone 5mg  daily.  ROS:  As above  Exam:  BP (!) 147/73   Pulse (!) 105   Temp 98.4 F (36.9 C) (Oral)   Wt 134 lb (60.8 kg)   BMI 23.74 kg/m  Wt Readings from Last 5 Encounters:  03/22/19 134 lb (60.8 kg)  03/14/19 134 lb (60.8 kg)  02/12/19 134 lb (60.8 kg)  02/09/19 133 lb 14.4 oz (60.7 kg)  01/09/19 132 lb (59.9 kg)   General: Well Developed, well nourished, and in no acute distress.  Neuro/Psych: Alert and oriented x3, extra-ocular muscles intact, able to move all 4 extremities, sensation grossly intact. Skin: Warm and dry, no rashes noted.  Respiratory: Not using accessory muscles, speaking in full sentences, trachea midline.  Cardiovascular: Pulses palpable, no extremity edema. Abdomen: Does not appear distended. MSK:  L-spine: Nontender to midline.  Normal lumbar motion.  Negative slump test bilaterally.   Reflexes and sensation are equal normal throughout bilateral extremities. Strength is intact throughout.  Left leg normal-appearing nontender normal hip knee and ankle motion. Intact strength. Mild antalgic gait.    Lab and Radiology Results CLINICAL DATA:  Low back pain radiating into the right buttock. Difficulty  walking. Leg weakness. Prior lumbar laminectomy in 2012.  BUN and creatinine were obtained on site at [facility] at  [address]  Results:  BUN 6 mg/dL,  Creatinine 0.8 mg/dL.  EXAM: MRI LUMBAR SPINE WITHOUT AND WITH CONTRAST  TECHNIQUE: Multiplanar and multiecho pulse sequences of the lumbar spine were obtained without and with intravenous contrast.  CONTRAST:  41mL MULTIHANCE GADOBENATE DIMEGLUMINE 529 MG/ML IV SOLN  COMPARISON:  02/27/2011  FINDINGS: There is mild lumbar dextroscoliosis. There is no significant listhesis. There is progressive, severe disc space narrowing at L3-4, greater on the left, with associated degenerative marrow changes. Scattered, mild degenerative marrow changes are also noted at the thoracolumbar junction and at L5-S1. There is diffuse lumbar disc desiccation. Scattered, small Schmorl's nodes are noted. Conus medullaris is normal in signal and terminates at L1-2. Paraspinal soft tissues are unremarkable.  T11-12: Only imaged sagittally. New/increased right central disc extrusion without definite stenosis.  T12-L1:  Minimal disc bulge without stenosis.  L1-2:  Mild circumferential disc bulge without stenosis, unchanged.  L2-3: Mild disc bulge and mild facet hypertrophy without stenosis. Disc bulging has slightly increased from prior.  L3-4: Sequelae of prior laminectomies are again identified with postoperative enhancement at the laminectomy bed. Disc bulging, endplate spurring, and facet hypertrophy result in mild left lateral recess stenosis, mild right neural foraminal stenosis, and severe left neural foraminal stenosis, not significantly changed. No spinal canal stenosis.  L4-5: Disc bulge and mild facet hypertrophy result in mild right and moderate left neural foraminal stenosis without spinal  canal stenosis, unchanged.  L5-S1: New, moderately large right foraminal disc extrusion results in severe  neural foraminal stenosis with mass effect upon the right L5 nerve root. Moderate right and mild left facet hypertrophy. No spinal canal or left neural foraminal stenosis.  IMPRESSION: 1. New right foraminal disc extrusion at L5-S1 affecting the right L5 nerve root. 2. Unchanged appearance of L3-4 with prior posterior decompression. Persistent, severe left neural foraminal stenosis. 3. Increased disc degeneration at T11-12 and L2-3 without stenosis.   Electronically Signed   By: Logan Bores   On: 02/24/2014 16:13  I personally (independently) visualized and performed the interpretation of the images attached in this note.   Assessment and Plan: 68 y.o. female with  Left leg pain.  The actual leg itself is normal-appearing and nontender.  I suspect the pain is due to lumbar radiculopathy.  Her pain largely fits into an L5 radicular pattern.  She certainly does have pathology at this level on MRI 2015. Fortunately she does not have significant weakness or numbness today.  Discussed options.  Plan for trial of prednisone.  Will use 50 mg a day for 5 days then go back down to her regular 5 mg dose.  If not improving next step would likely be MRI for epidural steroid injection planning.   PDMP not reviewed this encounter. No orders of the defined types were placed in this encounter.  Meds ordered this encounter  Medications  . predniSONE (DELTASONE) 50 MG tablet    Sig: Take 1 tablet (50 mg total) by mouth daily.    Dispense:  5 tablet    Refill:  0    Historical information moved to improve visibility of documentation.  Past Medical History:  Diagnosis Date  . CAD (coronary artery disease)   . Cervicalgia   . Chronic pain   . COPD (chronic obstructive pulmonary disease) ([place])   . DDD (degenerative disc disease), lumbar   . Diabetes mellitus   . Fatty liver   . Hyperlipidemia   . Narcotic abuse ([place])    history  . Osteoarthritis   . Osteoporosis   . Rheumatoid  arthritis ([place])   . Thyroid disease    hypothyroid   Past Surgical History:  Procedure Laterality Date  . BREAST BIOPSY    . BREAST EXCISIONAL BIOPSY    . BREAST SURGERY     braest lump/ benign  . CHOLECYSTECTOMY    . LUMBAR LAMINECTOMY  05/2010   Dr. Rushie Nyhan  . TOTAL KNEE ARTHROPLASTY  2010   right   Social History   Tobacco Use  . Smoking status: Current Every Day Smoker    Packs/day: 1.00    Years: 43.00    Pack years: 43.00    Types: Cigarettes  . Smokeless tobacco: Never Used  . Tobacco comment: Has tried Chantix and Wellbutrin in the past  Substance Use Topics  . Alcohol use: Yes    Alcohol/week: 5.0 standard drinks    Types: 5 Standard drinks or equivalent per week    Comment: 1 beer per day   family history includes Cancer in her maternal grandmother; Cirrhosis in her sister; Diabetes in her father; Heart disease (age of onset: 44) in her father and mother; Hyperlipidemia in her mother; Lymphoma in her mother; Rheum arthritis in her father and mother.  Medications: Current Outpatient Medications  Medication Sig Dispense Refill  . alendronate (FOSAMAX) 70 MG tablet TAKE ONE TABLET EVERY 7 DAYS WITH A FULL GLASS OF WATER ON  AN EMPTY STOMACH 4 tablet 11  . ALPRAZolam (XANAX) 1 MG tablet Take 1 tablet (1 mg total) by mouth at bedtime as needed. 30 tablet 5  . aspirin EC 81 MG tablet Take 81 mg by mouth daily.    [name] atorvastatin (LIPITOR) 80 MG tablet TAKE ONE TABLET BY MOUTH EVERY DAY 90 tablet 2  . Buprenorphine HCl-Naloxone HCl 8-2 MG FILM PLACE ONE AND ONE-HALF UNDER THE TONGUE EVERY 24 HOURS    . Buprenorphine HCl-Naloxone HCl 8-2 MG FILM PLACE TWO FILMS UNDER THE TONGUE EVERY DAY    . buPROPion (WELLBUTRIN XL) 150 MG 24 hr tablet TAKE 1 TABLET BY MOUTH EVERY DAY IN THE MORNING 90 tablet 3  . cetirizine (ZYRTEC) 10 MG tablet Take 10 mg by mouth daily.    . Cholecalciferol (VITAMIN D3) 1000 units CAPS Take by mouth.    . desvenlafaxine (PRISTIQ) 100 MG 24 hr tablet  Take 1 tablet (100 mg total) by mouth daily. 90 tablet 3  . diclofenac sodium (VOLTAREN) 1 % GEL Apply 4 g topically 4 (four) times daily. To affected joint. 100 g 1  . famotidine (PEPCID) 20 MG tablet Take 1 tablet (20 mg total) by mouth 2 (two) times daily. 60 tablet 5  . fluticasone furoate-vilanterol (BREO ELLIPTA) 100-25 MCG/INH AEPB Inhale 1 puff into the lungs daily. 1 each 11  . gabapentin (NEURONTIN) 600 MG tablet Take 1 tablet (600 mg total) by mouth 3 (three) times daily. 270 tablet 0  . hydroxychloroquine (PLAQUENIL) 200 MG tablet TK 1 T PO D    . levothyroxine (SYNTHROID) 137 MCG tablet Take 1 tablet (137 mcg total) by mouth daily before breakfast. 90 tablet 1  . lisinopril (PRINIVIL,ZESTRIL) 2.5 MG tablet Take 1 tablet (2.5 mg total) by mouth daily. 90 tablet 1  . meclizine (ANTIVERT) 25 MG tablet Take 1 tablet (25 mg total) by mouth 3 (three) times daily as needed for dizziness. 30 tablet 0  . metFORMIN (GLUCOPHAGE) 500 MG tablet TAKE 1 TABLET(500 MG) BY MOUTH TWICE DAILY WITH A MEAL 180 tablet 3  . methotrexate (RHEUMATREX) 15 MG tablet Take 1 tablet (15 mg total) by mouth once a week. Caution: Chemotherapy. Protect from light. 4 tablet 1  . nitroGLYCERIN (NITROSTAT) 0.4 MG SL tablet Place 1 tablet (0.4 mg total) under the tongue every 5 (five) minutes as needed for chest pain. 10 tablet 0  . predniSONE (DELTASONE) 5 MG tablet Take by mouth.    . promethazine (PHENERGAN) 25 MG tablet Take 1 tablet (25 mg total) by mouth every 8 (eight) hours as needed for nausea or vomiting. 20 tablet 0  . traZODone (DESYREL) 150 MG tablet Take 1 tablet (150 mg total) by mouth at bedtime. 90 tablet 4  . Vitamin D, Ergocalciferol, (DRISDOL) 1.25 MG (50000 UT) CAPS capsule TAKE 1 CAPSULE (50,000 UNITS TOTAL) BY MOUTH EVERY 7 (SEVEN) DAYS. 12 capsule 0  . predniSONE (DELTASONE) 50 MG tablet Take 1 tablet (50 mg total) by mouth daily. 5 tablet 0   No current facility-administered medications for this  visit.    Allergies  Allergen Reactions  . Codeine Palpitations and Other (See Comments)  . Naproxen Rash and Palpitations    GI upset  . Budesonide-Formoterol Fumarate Rash    REACTION: rash REACTION: rash REACTION: rash  . Duloxetine Hcl Other (See Comments)    No difference for pain or anti-depressant.  Other reaction(s): Other No difference for pain or anti-depressant. No difference for pain or anti-depressant.  [name]  Fentanyl Other (See Comments)    REACTION: severe aggitation, mood changes Other reaction(s): Hallucination, Psychiatric Hallucinations Hallucinations REACTION: severe aggitation, mood changes   . Tramadol-Acetaminophen     Other reaction(s): Unknown  . Effexor Xr [Venlafaxine Hcl Er]     Felt more anxious or no difference.   . Fluticasone-Salmeterol   . Lisinopril Rash    Maybe we dont really know      Discussed warning signs or symptoms. Please see discharge instructions. Patient expresses understanding.

## 2019-03-22 NOTE — Progress Notes (Signed)
Normal mammogram. Follow up 1 year.

## 2019-03-22 NOTE — Patient Instructions (Signed)
Thank you for coming in today.  Take higher dose of prednisone for 5 days then go back to normal 5mg  dose.  If not better let us know.   Come back or go to the emergency room if you notice new weakness new numbness problems walking or bowel or bladder problems.   Radicular Pain Radicular pain is a type of pain that spreads from your back or neck along a spinal nerve. Spinal nerves are nerves that leave the spinal cord and go to the muscles. Radicular pain is sometimes called radiculopathy, radiculitis, or a pinched nerve. When you have this type of pain, you may also have weakness, numbness, or tingling in the area of your body that is supplied by the nerve. The pain may feel sharp and burning. Depending on which spinal nerve is affected, the pain may occur in the:  Neck area (cervical radicular pain). You may also feel pain, numbness, weakness, or tingling in the arms.  Mid-spine area (thoracic radicular pain). You would feel this pain in the back and chest. This type is rare.  Lower back area (lumbar radicular pain). You would feel this pain as low back pain. You may feel pain, numbness, weakness, or tingling in the buttocks or legs. Sciatica is a type of lumbar radicular pain that shoots down the back of the leg. Radicular pain occurs when one of the spinal nerves becomes irritated or squeezed (compressed). It is often caused by something pushing on a spinal nerve, such as one of the bones of the spine (vertebrae) or one of the round cushions between vertebrae (intervertebral disks). This can result from:  An injury.  Wear and tear or aging of a disk.  The growth of a bone spur that pushes on the nerve. Radicular pain often goes away when you follow instructions from your health care provider for relieving pain at home. Follow these instructions at home: Managing pain      If directed, put ice on the affected area: ? Put ice in a plastic bag. ? Place a towel between your skin and  the bag. ? Leave the ice on for 20 minutes, 2-3 times a day.  If directed, apply heat to the affected area as often as told by your health care provider. Use the heat source that your health care provider recommends, such as a moist heat pack or a heating pad. ? Place a towel between your skin and the heat source. ? Leave the heat on for 20-30 minutes. ? Remove the heat if your skin turns bright red. This is especially important if you are unable to feel pain, heat, or cold. You may have a greater risk of getting burned. Activity   Do not sit or rest in bed for long periods of time.  Try to stay as active as possible. Ask your health care provider what type of exercise or activity is best for you.  Avoid activities that make your pain worse, such as bending and lifting.  Do not lift anything that is heavier than 10 lb (4.5 kg), or the limit that you are told, until your health care provider says that it is safe.  Practice using proper technique when lifting items. Proper lifting technique involves bending your knees and rising up.  Do strength and range-of-motion exercises only as told by your health care provider or physical therapist. General instructions  Take over-the-counter and prescription medicines only as told by your health care provider.  Pay attention to any changes  in your symptoms.  Keep all follow-up visits as told by your health care provider. This is important. ? Your health care provider may send you to a physical therapist to help with this pain. Contact a health care provider if:  Your pain and other symptoms get worse.  Your pain medicine is not helping.  Your pain has not improved after a few weeks of home care.  You have a fever. Get help right away if:  You have severe pain, weakness, or numbness.  You have difficulty with bladder or bowel control. Summary  Radicular pain is a type of pain that spreads from your back or neck along a spinal nerve.   When you have radicular pain, you may also have weakness, numbness, or tingling in the area of your body that is supplied by the nerve.  The pain may feel sharp or burning.  Radicular pain may be treated with ice, heat, medicines, or physical therapy. This information is not intended to replace advice given to you by your health care provider. Make sure you discuss any questions you have with your health care provider. Document Released: 06/24/2004 Document Revised: 11/29/2017 Document Reviewed: 11/29/2017 Elsevier Patient Education  2020 [employer].

## 2019-03-26 ENCOUNTER — Other Ambulatory Visit: Payer: Medicare Other

## 2019-03-27 ENCOUNTER — Ambulatory Visit (INDEPENDENT_AMBULATORY_CARE_PROVIDER_SITE_OTHER): Payer: Medicare Other | Admitting: Family Medicine

## 2019-03-27 ENCOUNTER — Encounter: Payer: Self-pay | Admitting: Family Medicine

## 2019-03-27 ENCOUNTER — Other Ambulatory Visit: Payer: Self-pay

## 2019-03-27 VITALS — BP 127/65 | HR 80 | Temp 98.3°F | Wt 135.0 lb

## 2019-03-27 DIAGNOSIS — G8929 Other chronic pain: Secondary | ICD-10-CM

## 2019-03-27 DIAGNOSIS — M533 Sacrococcygeal disorders, not elsewhere classified: Secondary | ICD-10-CM | POA: Diagnosis not present

## 2019-03-27 DIAGNOSIS — M5136 Other intervertebral disc degeneration, lumbar region: Secondary | ICD-10-CM

## 2019-03-27 DIAGNOSIS — M79605 Pain in left leg: Secondary | ICD-10-CM

## 2019-03-27 DIAGNOSIS — I2510 Atherosclerotic heart disease of native coronary artery without angina pectoris: Secondary | ICD-10-CM

## 2019-03-27 NOTE — Progress Notes (Signed)
[name] is a 68 y.o. female who presents to [place] today for leg pain.  Patient was seen in clinic on October 22 for left leg pain thought to represent lumbar radiculopathy.  Pain was occurring in the anterior thigh to lateral calf.  She was treated with short course of prednisone.  In the interim she notes she has not improved much at all.  She notes in the past she has had steroid injections in her SI joint that have helped and would like to try that today.  She is willing to proceed with MRI or epidural steroid injection if needed into the future.  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  ROS:  As above  Exam:  BP 127/65   Pulse 80   Temp 98.3 F (36.8 C) (Oral)   Wt 135 lb (61.2 kg)   BMI 23.91 kg/m  Wt Readings from Last 5 Encounters:  03/27/19 135 lb (61.2 kg)  03/22/19 134 lb (60.8 kg)  03/14/19 134 lb (60.8 kg)  02/12/19 134 lb (60.8 kg)  02/09/19 133 lb 14.4 oz (60.7 kg)   General: Well Developed, well nourished, and in no acute distress.  Neuro/Psych: Alert and oriented x3, extra-ocular muscles intact, able to move all 4 extremities, sensation grossly intact. Skin: Warm and dry, no rashes noted.  Respiratory: Not using accessory muscles, speaking in full sentences, trachea midline.  Cardiovascular: Pulses palpable, no extremity edema. Abdomen: Does not appear distended. MSK: L-spine: Nontender to spinal midline.  Tender palpation left SI joint. Decreased lumbar motion. Lower extremity reflexes and sensation are intact throughout.  Left hip normal-appearing normal motion.  Next Left knee normal-appearing nontender normal motion.  Next Left lower leg normal-appearing normal motion nontender.   No masses palpated.  Procedure: Real-time Ultrasound Guided Injection of left SI joint Device: GE Logiq E   Images permanently stored and available for review in the  ultrasound unit. Verbal informed consent obtained.  Discussed risks and benefits of procedure. Warned about infection bleeding damage to structures skin hypopigmentation and fat atrophy among others. Patient expresses understanding and agreement Time-out conducted.   Noted no overlying erythema, induration, or other signs of local infection.   Skin prepped in a sterile fashion.   Local anesthesia: Topical Ethyl chloride.   With sterile technique and under real time ultrasound guidance:  3 mL of lidocaine and 40 mg of Kenalog injected easily.   Completed without difficulty   Pain partially resolved suggesting accurate placement of the medication.   Advised to call if fevers/chills, erythema, induration, drainage, or persistent bleeding.   Images permanently stored and available for review in the ultrasound unit.  Impression: Technically successful ultrasound guided injection.        Lab and Radiology Results X-ray L-spine and x-ray tib-fib ordered and will be done tomorrow.  Assessment and Plan: 68 y.o. female with left leg pain.  Likely lumbar radicular.  Proceed with SI joint injection as patient has had problems there in the past and somewhat similar pain and had benefit in the past.   I am not super optimistic that this will provide complete resolution of her pain but it may help some and it certainly worth a reasonable try today.  Plan also proceed with x-ray L-spine and x-ray tib-fib.  Neck step likely is proceed with MRI lumbar spine to plan for epidural steroid injection which likely will provide more benefit.  In the meantime recheck back as needed.   PDMP not reviewed this encounter. Orders Placed This Encounter  Procedures  . DG Lumbar Spine Complete    Standing Status:   Future    Standing Expiration Date:   05/26/2020    Order Specific Question:   Reason for Exam (SYMPTOM  OR DIAGNOSIS REQUIRED)    Answer:   eval possible left lumbar rad L4 or L5 left    Order  Specific Question:   Preferred imaging location?    [facility]    Order Specific Question:   Radiology Contrast Protocol - do NOT remove file path    Answer:   \\[email or web address]\DXFluoroContrastProtocols.pdf  . DG Tibia/Fibula Left    Standing Status:   Future    Standing Expiration Date:   05/26/2020    Order Specific Question:   Reason for Exam (SYMPTOM  OR DIAGNOSIS REQUIRED)    Answer:   eval left lower leg pain    Order Specific Question:   Preferred imaging location?    Answer[facility]    Order Specific Question:   Radiology Contrast Protocol - do NOT remove file path    Answer:   \\[email or web address]\Radiant\DXFluoroContrastProtocols.pdf   No orders of the defined types were placed in this encounter.   Historical information moved to improve visibility of documentation.  Past Medical History:  Diagnosis Date  . CAD (coronary artery disease)   . Cervicalgia   . Chronic pain   . COPD (chronic obstructive pulmonary disease) (HCC)   . DDD (degenerative disc disease), lumbar   . Diabetes mellitus   . Fatty liver   . Hyperlipidemia   . Narcotic abuse (HCC)    history  . Osteoarthritis   . Osteoporosis   . Rheumatoid arthritis (HCC)   . Thyroid disease    hypothyroid   Past Surgical History:  Procedure Laterality Date  . BREAST BIOPSY    . BREAST EXCISIONAL BIOPSY    . BREAST SURGERY     braest lump/ benign  . CHOLECYSTECTOMY    . LUMBAR LAMINECTOMY  05/2010   [place]. [place]  . TOTAL KNEE ARTHROPLASTY  2010   right   Social History   Tobacco Use  . Smoking status: Current Every Day Smoker    Packs/day: 1.00    Years: 43.00    Pack years: 43.00    Types: Cigarettes  . Smokeless tobacco: Never Used  . Tobacco comment: Has tried Chantix and Wellbutrin in the past  Substance Use Topics  . Alcohol use: Yes    Alcohol/week: 5.0 standard drinks    Types: 5 Standard drinks or equivalent per week    Comment: 1 beer  per day   family history includes Cancer in her maternal grandmother; Cirrhosis in her sister; Diabetes in her father; Heart disease (age of onset: 760) in her father and mother; Hyperlipidemia in her mother; Lymphoma in her mother; Rheum arthritis in her father and mother.  Medications: Current Outpatient Medications  Medication Sig Dispense Refill  . alendronate (FOSAMAX) 70 MG tablet TAKE ONE TABLET EVERY 7 DAYS WITH A FULL GLASS OF WATER ON AN EMPTY STOMACH 4 tablet 11  . ALPRAZolam (XANAX) 1 MG tablet Take 1 tablet (1 mg total) by mouth at bedtime as needed. 30 tablet 5  . aspirin EC 81 MG tablet Take 81 mg by mouth daily.    [name]. atorvastatin (LIPITOR) 80 MG tablet TAKE ONE TABLET BY MOUTH EVERY DAY 90 tablet  2  . Buprenorphine HCl-Naloxone HCl 8-2 MG FILM PLACE ONE AND ONE-HALF UNDER THE TONGUE EVERY 24 HOURS    . Buprenorphine HCl-Naloxone HCl 8-2 MG FILM PLACE TWO FILMS UNDER THE TONGUE EVERY DAY    . buPROPion (WELLBUTRIN XL) 150 MG 24 hr tablet TAKE 1 TABLET BY MOUTH EVERY DAY IN THE MORNING 90 tablet 3  . cetirizine (ZYRTEC) 10 MG tablet Take 10 mg by mouth daily.    . Cholecalciferol (VITAMIN D3) 1000 units CAPS Take by mouth.    . desvenlafaxine (PRISTIQ) 100 MG 24 hr tablet Take 1 tablet (100 mg total) by mouth daily. 90 tablet 3  . diclofenac sodium (VOLTAREN) 1 % GEL Apply 4 g topically 4 (four) times daily. To affected joint. 100 g 1  . famotidine (PEPCID) 20 MG tablet Take 1 tablet (20 mg total) by mouth 2 (two) times daily. 60 tablet 5  . fluticasone furoate-vilanterol (BREO ELLIPTA) 100-25 MCG/INH AEPB Inhale 1 puff into the lungs daily. 1 each 11  . gabapentin (NEURONTIN) 600 MG tablet Take 1 tablet (600 mg total) by mouth 3 (three) times daily. 270 tablet 0  . hydroxychloroquine (PLAQUENIL) 200 MG tablet TK 1 T PO D    . levothyroxine (SYNTHROID) 137 MCG tablet Take 1 tablet (137 mcg total) by mouth daily before breakfast. 90 tablet 1  . lisinopril (PRINIVIL,ZESTRIL) 2.5 MG  tablet Take 1 tablet (2.5 mg total) by mouth daily. 90 tablet 1  . meclizine (ANTIVERT) 25 MG tablet Take 1 tablet (25 mg total) by mouth 3 (three) times daily as needed for dizziness. 30 tablet 0  . metFORMIN (GLUCOPHAGE) 500 MG tablet TAKE 1 TABLET(500 MG) BY MOUTH TWICE DAILY WITH A MEAL 180 tablet 3  . methotrexate (RHEUMATREX) 15 MG tablet Take 1 tablet (15 mg total) by mouth once a week. Caution: Chemotherapy. Protect from light. 4 tablet 1  . nitroGLYCERIN (NITROSTAT) 0.4 MG SL tablet Place 1 tablet (0.4 mg total) under the tongue every 5 (five) minutes as needed for chest pain. 10 tablet 0  . predniSONE (DELTASONE) 5 MG tablet Take by mouth.    . predniSONE (DELTASONE) 50 MG tablet Take 1 tablet (50 mg total) by mouth daily. 5 tablet 0  . promethazine (PHENERGAN) 25 MG tablet Take 1 tablet (25 mg total) by mouth every 8 (eight) hours as needed for nausea or vomiting. 20 tablet 0  . traZODone (DESYREL) 150 MG tablet Take 1 tablet (150 mg total) by mouth at bedtime. 90 tablet 4  . Vitamin D, Ergocalciferol, (DRISDOL) 1.25 MG (50000 UT) CAPS capsule TAKE 1 CAPSULE (50,000 UNITS TOTAL) BY MOUTH EVERY 7 (SEVEN) DAYS. 12 capsule 0   No current facility-administered medications for this visit.    Allergies  Allergen Reactions  . Codeine Palpitations and Other (See Comments)  . Naproxen Rash and Palpitations    GI upset  . Budesonide-Formoterol Fumarate Rash    REACTION: rash REACTION: rash REACTION: rash  . Duloxetine Hcl Other (See Comments)    No difference for pain or anti-depressant.  Other reaction(s): Other No difference for pain or anti-depressant. No difference for pain or anti-depressant.  . Fentanyl Other (See Comments)    REACTION: severe aggitation, mood changes Other reaction(s): Hallucination, Psychiatric Hallucinations Hallucinations REACTION: severe aggitation, mood changes   . Tramadol-Acetaminophen     Other reaction(s): Unknown  . Effexor Xr [Venlafaxine Hcl  Er]     Felt more anxious or no difference.   . Fluticasone-Salmeterol   .  Lisinopril Rash    Maybe we dont really know      Discussed warning signs or symptoms. Please see discharge instructions. Patient expresses understanding.

## 2019-03-27 NOTE — Patient Instructions (Signed)
Thank you for coming in today.  Get xray today or tomorrow.   Plan for MRI for injection planning.  I will get results to you ASAP.   Call or go to the ER if you develop a large red swollen joint with extreme pain or oozing puss.

## 2019-03-29 ENCOUNTER — Other Ambulatory Visit: Payer: Self-pay | Admitting: Physician Assistant

## 2019-03-29 DIAGNOSIS — F419 Anxiety disorder, unspecified: Secondary | ICD-10-CM

## 2019-03-30 MED ORDER — ALPRAZOLAM 1 MG PO TABS: 1.0000 mg | ORAL_TABLET | Freq: Every evening | ORAL | 5 refills | Status: DC | PRN

## 2019-03-30 NOTE — Addendum Note (Signed)
Addended byAnnamaria Helling on: 03/30/2019 11:24 AM   Modules accepted: Orders

## 2019-03-30 NOTE — Telephone Encounter (Signed)
Xanax RX denied today due to written in September with 5 refills. Received paper note from pharmacy stating "Patient requests new RX Alprazolam 1 mg tablet. RX on 02/12/2019 has been inactivated. Please send new RX to fill".   Please advise.

## 2019-04-22 ENCOUNTER — Other Ambulatory Visit: Payer: Self-pay | Admitting: Physician Assistant

## 2019-04-22 DIAGNOSIS — E039 Hypothyroidism, unspecified: Secondary | ICD-10-CM

## 2019-04-23 ENCOUNTER — Other Ambulatory Visit: Payer: Self-pay | Admitting: Physician Assistant

## 2019-04-23 DIAGNOSIS — M5136 Other intervertebral disc degeneration, lumbar region: Secondary | ICD-10-CM

## 2019-05-04 ENCOUNTER — Telehealth: Payer: Self-pay | Admitting: Neurology

## 2019-05-04 NOTE — Telephone Encounter (Signed)
Left message on machine for patient making her aware of recommendation and to call back with any further questions.

## 2019-05-04 NOTE — Telephone Encounter (Signed)
Patient left vm stating she had shingles vaccine awhile back when it was only recommended to get one (12/08/2010). She wants to know if she should get a second shingles shot? Please advise.

## 2019-05-04 NOTE — Telephone Encounter (Signed)
Shingrix 2 dose series is recommended. Would need to get at pharmacy. Medicare will not pay for it at clinic.

## 2019-05-11 ENCOUNTER — Other Ambulatory Visit: Payer: Self-pay

## 2019-05-11 ENCOUNTER — Ambulatory Visit (INDEPENDENT_AMBULATORY_CARE_PROVIDER_SITE_OTHER): Payer: Medicare Other | Admitting: Physician Assistant

## 2019-05-11 VITALS — BP 130/88 | HR 112 | Ht 63.0 in | Wt 139.0 lb

## 2019-05-11 DIAGNOSIS — I2510 Atherosclerotic heart disease of native coronary artery without angina pectoris: Secondary | ICD-10-CM | POA: Diagnosis not present

## 2019-05-11 DIAGNOSIS — R5383 Other fatigue: Secondary | ICD-10-CM | POA: Diagnosis not present

## 2019-05-11 DIAGNOSIS — G4710 Hypersomnia, unspecified: Secondary | ICD-10-CM | POA: Diagnosis not present

## 2019-05-11 NOTE — Progress Notes (Signed)
Subjective:     Patient ID: [name], female   DOB: Jun 16, 1950, 68 y.o.   MRN: [id number]  HPI Pt is a 68 yo female with a hx MDD, hypothyroidism and type 2 diabetes, COPD, CAD, hx of MI who presents to the clinic for increased sleepiness.   Pt states states she sleeps 16-18 hours nightly. She goes to sleep at 6:30 pm and wakes up at 9:30 am everyday. Pt also takes a daily nap. Pt does not wake feeling well rested.   She states this started a couple months ago and has progressively worsened. Denies snoring, or frequent awakenings. Pt does not do much during the daytime do to extreme lack of energy. Unable to do household work due to fatigue.   Before this began, pt reports sleeping 10 hours nightly.   Denies unintentional weight changes, fever, chills. Admits to night sweats a couple times a week.  Denies chest pain, SOB. Denies blood in stool/ black tar stools.   Pt admits to decreased appetite, eats one meal daily.   Pt reports mood has not been good lately. Having a difficult time functioning. Pt reports feeling more down than normal due to increased sleep.   She denies any problems breathing. She denies any abnormal cough or SOB.   Pt is taking Synthroid tablet once daily, did not change dose in October as directed.   .. Active Ambulatory Problems    Diagnosis Date Noted  . Mixed hyperlipidemia 09/28/2007  . HYPERCALCEMIA 11/13/2009  . MDD (major depressive disorder), recurrent episode, mild ([place]) 09/28/2007  . Chronic pain syndrome 06/05/2007  . FATTY LIVER DISEASE 09/28/2007  . ARTHRITIS, ACROMIOCLAVICULAR 07/31/2008  . SPINAL STENOSIS IN CERVICAL REGION 07/31/2008  . OSTEOPOROSIS 10/17/2007  . Elevated liver function tests 09/28/2007  . Allergic rhinitis 10/06/2010  . Chronic headache 04/03/2011  . Tobacco use 04/03/2011  . Chronic pain 04/03/2011  . Benign hypertension 04/03/2011  . Sacroiliitis ([place]) 09/14/2011  . Diabetes type 2, controlled ([place]) 03/14/2013  .  Hypothyroidism 03/14/2013  . Trochanteric bursitis of right hip 05/04/2013  . Anxiety state 05/16/2013  . Ganglion cyst of wrist 07/13/2013  . Acute pain of left shoulder 09/23/2013  . COLD (chronic obstructive lung disease) ([place]) 12/06/2013  . Hyperlipidemia 01/22/2015  . COPD (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 ([place]) 03/05/2015  . Myocardial infarct ([place]) 03/09/2017  . CAD (coronary artery disease) 03/09/2017  . Rotator cuff tendonitis, left 03/10/2017  . Anxiety 03/12/2017  . Left upper arm swelling secondary to rotator cuff tear and fluid tracking down biceps tendon sheath 04/14/2017  . Common bile duct dilatation 04/27/2017  . Lumbar degenerative disc disease 05/13/2017  . Abnormal finding on GI tract imaging 05/25/2017  . Chronic back pain 02/27/2016  . Atherosclerosis of native coronary artery of native heart without angina pectoris 03/16/2016  . RA (rheumatoid arthritis) ([place]) 07/06/2016  . Acquired hypothyroidism 12/01/2015  . Moderate episode of recurrent major depressive disorder ([place]) 07/24/2017  . Bilateral calf pain 07/24/2017  . Memory changes 07/24/2017  . Bilateral cataracts 09/07/2017  . Dry eye syndrome of bilateral lacrimal glands 09/07/2017  . Positive colorectal cancer screening using Cologuard test 10/07/2017  . Microalbuminuria 10/07/2017  . Weakness of both lower extremities 10/07/2017  . History of diverticulitis 11/21/2017  . Non-intractable vomiting with nausea 11/21/2017  . Left lower quadrant guarding 11/21/2017  . Generalized abdominal pain 11/21/2017  . Vasovagal syncope 12/16/2017  . Vitamin D deficiency 04/05/2018  . Primary insomnia 04/05/2018  .  Hiatal hernia 04/05/2018  . Primary osteoarthritis 07/14/2018  . Gastroesophageal reflux disease with esophagitis 07/14/2018  . Fixed pupil of both eyes 07/18/2018  . Frequent headaches 07/18/2018  . Unsteady gait 07/18/2018  . Fatigue 12/11/2018  . Excessive sleepiness  05/15/2019   Resolved Ambulatory Problems    Diagnosis Date Noted  . DM (diabetes mellitus) (HCC) 06/05/2007  . HEMORRHOIDS, WITH BLEEDING 04/22/2008  . Acute maxillary sinusitis 04/10/2010  . Alcoholic fatty liver 12/24/2009  . RENAL INSUFFICIENCY, ACUTE 10/23/2008  . ROTATOR CUFF SYNDROME, RIGHT 04/01/2008  . ALLERGIC REACTION 12/24/2009  . ELEVATED BLOOD PRESSURE WITHOUT DIAGNOSIS OF HYPERTENSION 05/13/2010  . CHRONIC OBSTRUCTIVE PULMONARY DISEASE, ACUTE EXACERBATION 08/14/2010  . Recurrent acute sinusitis 09/09/2010  . Allergic dermatitis due to poison ivy 11/27/2010  . DERMATITIS, ATOPIC 01/15/2011  . Arthritis of carpometacarpal joint 02/14/2012  . Ganglion cyst of left foot 07/13/2013  . Chronic pain syndrome 12/05/2013  . Primary osteoarthritis of left wrist 12/10/2014  . Rheumatoid arthritis involving both hands (HCC) 03/09/2017  . Bulging lumbar disc 02/27/2016  . Pyelonephritis 11/21/2017  . Upper abdominal pain 03/10/2018  . Patellar sleeve fracture, right, closed, initial encounter 04/24/2018   Past Medical History:  Diagnosis Date  . Cervicalgia   . COPD (chronic obstructive pulmonary disease) (HCC)   . DDD (degenerative disc disease), lumbar   . Diabetes mellitus   . Fatty liver   . Narcotic abuse (HCC)   . Osteoarthritis   . Osteoporosis   . Rheumatoid arthritis (HCC)   . Thyroid disease      Review of Systems See HPI.     Objective:   Physical Exam Constitutional:      Appearance: She is well-developed and well-nourished.  HENT:     Head: Normocephalic and atraumatic.  Eyes:     Conjunctiva/sclera: Conjunctivae normal.     Pupils: Pupils are equal, round, and reactive to light.  Cardiovascular:     Rate and Rhythm: Normal rate and regular rhythm.     Heart sounds: Normal heart sounds.  Pulmonary:     Effort: Pulmonary effort is normal.     Comments: Coarse breath sounds.  Musculoskeletal:        General: Normal range of motion.      Cervical back: Normal range of motion and neck supple.  Skin:    General: Skin is warm and dry.  Neurological:     Mental Status: She is alert and oriented to person, place, and time.  Psychiatric:        Mood and Affect: Mood and affect and mood normal.   .. Depression screen [facility] 2/9 05/11/2019 09/19/2018 01/25/2018 03/09/2017  Decreased Interest 1 0 0 0  Down, Depressed, Hopeless 1 0 0 1  PHQ - 2 Score 2 0 0 1  Altered sleeping 1 - - -  Tired, decreased energy 2 - - -  Change in appetite 2 - - -  Feeling bad or failure about yourself  2 - - -  Trouble concentrating 2 - - -  Moving slowly or fidgety/restless 0 - - -  Suicidal thoughts 2 - - -  PHQ-9 Score 13 - - -  Difficult doing work/chores Somewhat difficult - - -  Some recent data might be hidden   .[name] GAD 7 : Generalized Anxiety Score 05/11/2019 09/19/2018  Nervous, Anxious, on Edge 3 1  Control/stop worrying 0 0  Worry too much - different things 0 0  Trouble relaxing 0 1  Restless 0  0  Easily annoyed or irritable 0 0  Afraid - awful might happen 0 0  Total GAD 7 Score 3 2  Anxiety Difficulty Somewhat difficult Not difficult at all      Assessment:     .[name] was seen today for fatigue.  Diagnoses and all orders for this visit:  Fatigue, unspecified type -     TSH -     CBC w/Diff -     BASIC METABOLIC PANEL WITH GFR  Excessive sleepiness      Plan:     Unclear etiology of fatigue.   Mood appears stable. PHQ/GAD-7 stable.   Concerned for worsening hypoxia state causing worsening fatigue. Pt refused 6 minute walk test, we opted to monitor her on her walk to the car which was approximately 2 minutes. O2 sat dropped from 93% to 90% it is likely that this would have continued to drop with longer duration. Fatigue could be due to chronic pulmonary stress secondary to COPD.   Will do sleep study, TSH, CBC and CMP to evaluate for other causes of fatigue.   Continue on current, one tablet QD synthroid  dosing. Will revaluate Synthroid dosing once TSH levels are resulted.   F/U in 1 month or as needed.   Marland KitchenHarlon Flor PA-C, have reviewed and agree with the above documentation in it's entirety.   [name].Spent 30 minutes with patient and greater than 50 percent of visit spent counseling patient regarding treatment plan.

## 2019-05-14 ENCOUNTER — Other Ambulatory Visit: Payer: Self-pay

## 2019-05-14 ENCOUNTER — Ambulatory Visit (INDEPENDENT_AMBULATORY_CARE_PROVIDER_SITE_OTHER): Payer: Medicare Other

## 2019-05-14 ENCOUNTER — Encounter: Payer: Self-pay | Admitting: Physician Assistant

## 2019-05-14 ENCOUNTER — Ambulatory Visit (INDEPENDENT_AMBULATORY_CARE_PROVIDER_SITE_OTHER): Payer: Medicare Other | Admitting: Physician Assistant

## 2019-05-14 VITALS — BP 167/87 | HR 111 | Wt 136.0 lb

## 2019-05-14 DIAGNOSIS — E039 Hypothyroidism, unspecified: Secondary | ICD-10-CM | POA: Diagnosis not present

## 2019-05-14 DIAGNOSIS — E119 Type 2 diabetes mellitus without complications: Secondary | ICD-10-CM

## 2019-05-14 DIAGNOSIS — F331 Major depressive disorder, recurrent, moderate: Secondary | ICD-10-CM

## 2019-05-14 DIAGNOSIS — J432 Centrilobular emphysema: Secondary | ICD-10-CM

## 2019-05-14 DIAGNOSIS — I10 Essential (primary) hypertension: Secondary | ICD-10-CM

## 2019-05-14 DIAGNOSIS — R5383 Other fatigue: Secondary | ICD-10-CM

## 2019-05-14 DIAGNOSIS — J449 Chronic obstructive pulmonary disease, unspecified: Secondary | ICD-10-CM | POA: Diagnosis not present

## 2019-05-14 DIAGNOSIS — I2510 Atherosclerotic heart disease of native coronary artery without angina pectoris: Secondary | ICD-10-CM

## 2019-05-14 DIAGNOSIS — R630 Anorexia: Secondary | ICD-10-CM | POA: Diagnosis not present

## 2019-05-14 DIAGNOSIS — G4710 Hypersomnia, unspecified: Secondary | ICD-10-CM

## 2019-05-14 NOTE — Patient Instructions (Signed)
START BREO.

## 2019-05-14 NOTE — Progress Notes (Signed)
b  Subjective:    Patient ID: [name], female    DOB: 16-Dec-1950, 68 y.o.   MRN: [id number]  HPI  Patient is a 68 year old female with type 2 diabetes, CAD, history of MI, hypertension, COPD, MDD, GAD, GERD, RA, hypothyroidism who presents to the clinic to follow-up on excessive fatigue.  She was seen last week but had to leave because she was feeling weak.  We attempted to do a 6-minute walk test but did not make it more than 2 minutes before she got her car and did not want to continue with the test. There did seem to be some oxygen de-stating going on.   Patient denies any chest pain, headache, dizziness, palpitations. She denies any lower extremity swelling.  She did admit she did not take her blood pressure medications today.  She continues to be fatigued.  She slept from 6 PM to 9 AM last night.  She woke up to 1 okay but even now she feels like she could take a nap.  She denies any worsening production of cough and/or shortness of breath.  She does admit to getting short of breath when she does minimal tasks.  She admits she is not using her Breo.  Patient has no appetite.  She admits she only eats once a day and she usually a sandwich. No significant weight changes.   .. Active Ambulatory Problems    Diagnosis Date Noted  . Mixed hyperlipidemia 09/28/2007  . HYPERCALCEMIA 11/13/2009  . MDD (major depressive disorder), recurrent episode, mild (HCC) 09/28/2007  . Chronic pain syndrome 06/05/2007  . FATTY LIVER DISEASE 09/28/2007  . ARTHRITIS, ACROMIOCLAVICULAR 07/31/2008  . SPINAL STENOSIS IN CERVICAL REGION 07/31/2008  . OSTEOPOROSIS 10/17/2007  . Elevated liver function tests 09/28/2007  . Allergic rhinitis 10/06/2010  . Chronic headache 04/03/2011  . Tobacco use 04/03/2011  . Chronic pain 04/03/2011  . Benign hypertension 04/03/2011  . Sacroiliitis (HCC) 09/14/2011  . Diabetes type 2, controlled (HCC) 03/14/2013  . Hypothyroidism 03/14/2013  . Trochanteric bursitis  of right hip 05/04/2013  . Anxiety state 05/16/2013  . Ganglion cyst of wrist 07/13/2013  . Acute pain of left shoulder 09/23/2013  . COLD (chronic obstructive lung disease) (HCC) 12/06/2013  . Hyperlipidemia 01/22/2015  . 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 Myocardial infarct (HCC) 03/09/2017  . CAD (coronary artery disease) 03/09/2017  . Rotator cuff tendonitis, left 03/10/2017  . Anxiety 03/12/2017  . Left upper arm swelling secondary to rotator cuff tear and fluid tracking down biceps tendon sheath 04/14/2017  . Common bile duct dilatation 04/27/2017  . Lumbar degenerative disc disease 05/13/2017  . Abnormal finding on GI tract imaging 05/25/2017  . Chronic back pain 02/27/2016  . Atherosclerosis of native coronary artery of native heart without angina pectoris 03/16/2016  . RA (rheumatoid arthritis) (HCC) 07/06/2016  . Acquired hypothyroidism 12/01/2015  . Moderate episode of recurrent major depressive disorder (HCC) 07/24/2017  . Bilateral calf pain 07/24/2017  . Memory changes 07/24/2017  . Bilateral cataracts 09/07/2017  . Dry eye syndrome of bilateral lacrimal glands 09/07/2017  . Positive colorectal cancer screening using Cologuard test 10/07/2017  . Microalbuminuria 10/07/2017  . Weakness of both lower extremities 10/07/2017  . History of diverticulitis 11/21/2017  . Non-intractable vomiting with nausea 11/21/2017  . Left lower quadrant guarding 11/21/2017  . Generalized abdominal pain 11/21/2017  . Vasovagal syncope 12/16/2017  . Vitamin D deficiency 04/05/2018  . Primary insomnia 04/05/2018  .  Hiatal hernia 04/05/2018  . Primary osteoarthritis 07/14/2018  . Gastroesophageal reflux disease with esophagitis 07/14/2018  . Fixed pupil of both eyes 07/18/2018  . Frequent headaches 07/18/2018  . Unsteady gait 07/18/2018  . Fatigue 12/11/2018  . Excessive sleepiness 05/15/2019   Resolved Ambulatory Problems     Diagnosis Date Noted  . DM (diabetes mellitus) ([place]) 06/05/2007  . HEMORRHOIDS, WITH BLEEDING 04/22/2008  . Acute maxillary sinusitis 04/10/2010  . Alcoholic fatty liver 81/19/1478  . RENAL INSUFFICIENCY, ACUTE 10/23/2008  . ROTATOR CUFF SYNDROME, RIGHT 04/01/2008  . ALLERGIC REACTION 12/24/2009  . ELEVATED BLOOD PRESSURE WITHOUT DIAGNOSIS OF HYPERTENSION 05/13/2010  . CHRONIC OBSTRUCTIVE PULMONARY DISEASE, ACUTE EXACERBATION 08/14/2010  . Recurrent acute sinusitis 09/09/2010  . Allergic dermatitis due to poison ivy 11/27/2010  . DERMATITIS, ATOPIC 01/15/2011  . Arthritis of carpometacarpal joint 02/14/2012  . Ganglion cyst of left foot 07/13/2013  . Chronic pain syndrome 12/05/2013  . Primary osteoarthritis of left wrist 12/10/2014  . Rheumatoid arthritis involving both hands ([place]) 03/09/2017  . Bulging lumbar disc 02/27/2016  . Pyelonephritis 11/21/2017  . Upper abdominal pain 03/10/2018  . Patellar sleeve fracture, right, closed, initial encounter 04/24/2018   Past Medical History:  Diagnosis Date  . Cervicalgia   . COPD (chronic obstructive pulmonary disease) ([place])   . DDD (degenerative disc disease), lumbar   . Diabetes mellitus   . Fatty liver   . Narcotic abuse ([place])   . Osteoarthritis   . Osteoporosis   . Rheumatoid arthritis ([place])   . Thyroid disease        Review of Systems See HPI.     Objective:   Physical Exam Vitals reviewed.  Constitutional:      Appearance: Normal appearance.  HENT:     Head: Normocephalic.  Cardiovascular:     Rate and Rhythm: Normal rate and regular rhythm.     Pulses: Normal pulses.  Pulmonary:     Effort: Pulmonary effort is normal.     Breath sounds: Rhonchi present.     Comments: Scattered rhonci.  Coarse breath sounds.  Lymphadenopathy:     Cervical: No cervical adenopathy.  Neurological:     General: No focal deficit present.     Mental Status: She is alert and oriented to person, place, and time.  Psychiatric:         Mood and Affect: Mood normal.           Assessment & Plan:  Marland KitchenMarland KitchenDiane was seen today for fatigue.  Diagnoses and all orders for this visit:  Excessive sleepiness  Centrilobular emphysema (HCC) -     CXR 2 view  Benign hypertension  Coronary artery disease involving native heart without angina pectoris, unspecified vessel or lesion type  COPD (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)  Acquired hypothyroidism  Controlled type 2 diabetes mellitus without complication, without long-term current use of insulin (HCC)  Fatigue, unspecified type  Moderate episode of recurrent major depressive disorder ([place])    We will proceed with sleep study. Sent [facility] order form.  Patient did not get labs at last visit.  Strongly encouraged her to get labs to evaluate thyroid, anemia, nutritional status, kidney and liver.  Discussed importance of nutrition.  Urged her to get boost or Ensure as a meal replacement if she does not feel like eating.  6-minute walk test was done today and she did stay at 94% oxygen saturation.  That does not show any significant hypoxia.  I do think patient  would feel better if  She would start her Breo every day.  Strongly encouraged her to start this every day.  We can then consider maximizing therapy by adding a LAMA if not to goal. This could even increase her energy. Will get CXR today. No signs of bacterial infection but with fatigue and weak lungs need to rule out walking pneumonia.   Mood stable.   BP elevated today but did not take medication.   Follow up after sleep study.

## 2019-05-15 ENCOUNTER — Encounter: Payer: Self-pay | Admitting: Physician Assistant

## 2019-05-15 ENCOUNTER — Telehealth: Payer: Self-pay | Admitting: Neurology

## 2019-05-15 DIAGNOSIS — R630 Anorexia: Secondary | ICD-10-CM | POA: Insufficient documentation

## 2019-05-15 DIAGNOSIS — G4710 Hypersomnia, unspecified: Secondary | ICD-10-CM | POA: Insufficient documentation

## 2019-05-15 LAB — CBC WITH DIFFERENTIAL/PLATELET
Absolute Monocytes: 924 cells/uL (ref 200–950)
Basophils Absolute: 63 cells/uL (ref 0–200)
Basophils Relative: 0.6 %
Eosinophils Absolute: 189 cells/uL (ref 15–500)
Eosinophils Relative: 1.8 %
HCT: 46.2 % — ABNORMAL HIGH (ref 35.0–45.0)
Hemoglobin: 15.4 g/dL (ref 11.7–15.5)
Lymphs Abs: 2058 cells/uL (ref 850–3900)
MCH: 31 pg (ref 27.0–33.0)
MCHC: 33.3 g/dL (ref 32.0–36.0)
MCV: 93 fL (ref 80.0–100.0)
MPV: 10.5 fL (ref 7.5–12.5)
Monocytes Relative: 8.8 %
Neutro Abs: 7266 cells/uL (ref 1500–7800)
Neutrophils Relative %: 69.2 %
Platelets: 247 10*3/uL (ref 140–400)
RBC: 4.97 10*6/uL (ref 3.80–5.10)
RDW: 11.8 % (ref 11.0–15.0)
Total Lymphocyte: 19.6 %
WBC: 10.5 10*3/uL (ref 3.8–10.8)

## 2019-05-15 LAB — BASIC METABOLIC PANEL WITH GFR
BUN: 12 mg/dL (ref 7–25)
CO2: 27 mmol/L (ref 20–32)
Calcium: 9.4 mg/dL (ref 8.6–10.4)
Chloride: 103 mmol/L (ref 98–110)
Creat: 0.69 mg/dL (ref 0.50–0.99)
GFR, Est African American: 103 mL/min/{1.73_m2} (ref >=60)
GFR, Est Non African American: 89 mL/min/{1.73_m2} (ref >=60)
Glucose, Bld: 138 mg/dL — ABNORMAL HIGH (ref 65–99)
Potassium: 4.2 mmol/L (ref 3.5–5.3)
Sodium: 139 mmol/L (ref 135–146)

## 2019-05-15 LAB — TSH: TSH: 1.98 mIU/L (ref 0.40–4.50)

## 2019-05-15 NOTE — Telephone Encounter (Signed)
Referral faxed to [facility] home sleep test diagnostics at [phone] with confirmation received. They will contact patient to schedule.

## 2019-05-15 NOTE — Progress Notes (Signed)
Call pt: thyroid is perfect. How ever you were taking medication please document in chart and make sure it is right so we can continue this dose. Labs unremarkable and no reason for fatigue found.

## 2019-05-15 NOTE — Progress Notes (Signed)
Call pt: no pneumonia. Chronic bronchitis changes seen. Known granuloma of left upper lobe.

## 2019-05-17 NOTE — Telephone Encounter (Signed)
Received notification from [facility] that order received on patient and they will complete registration process.  

## 2019-05-18 DIAGNOSIS — Z7984 Long term (current) use of oral hypoglycemic drugs: Secondary | ICD-10-CM | POA: Diagnosis not present

## 2019-05-18 DIAGNOSIS — Z7952 Long term (current) use of systemic steroids: Secondary | ICD-10-CM | POA: Diagnosis not present

## 2019-05-18 DIAGNOSIS — J439 Emphysema, unspecified: Secondary | ICD-10-CM | POA: Diagnosis present

## 2019-05-18 DIAGNOSIS — M47812 Spondylosis without myelopathy or radiculopathy, cervical region: Secondary | ICD-10-CM | POA: Diagnosis present

## 2019-05-18 DIAGNOSIS — Z79890 Hormone replacement therapy (postmenopausal): Secondary | ICD-10-CM | POA: Diagnosis not present

## 2019-05-18 DIAGNOSIS — F329 Major depressive disorder, single episode, unspecified: Secondary | ICD-10-CM | POA: Diagnosis present

## 2019-05-18 DIAGNOSIS — E1142 Type 2 diabetes mellitus with diabetic polyneuropathy: Secondary | ICD-10-CM | POA: Diagnosis present

## 2019-05-18 DIAGNOSIS — K219 Gastro-esophageal reflux disease without esophagitis: Secondary | ICD-10-CM | POA: Diagnosis present

## 2019-05-18 DIAGNOSIS — M47816 Spondylosis without myelopathy or radiculopathy, lumbar region: Secondary | ICD-10-CM | POA: Diagnosis present

## 2019-05-18 DIAGNOSIS — Z7982 Long term (current) use of aspirin: Secondary | ICD-10-CM | POA: Diagnosis not present

## 2019-05-18 DIAGNOSIS — I10 Essential (primary) hypertension: Secondary | ICD-10-CM | POA: Diagnosis present

## 2019-05-18 DIAGNOSIS — A419 Sepsis, unspecified organism: Secondary | ICD-10-CM | POA: Diagnosis present

## 2019-05-18 DIAGNOSIS — M810 Age-related osteoporosis without current pathological fracture: Secondary | ICD-10-CM | POA: Diagnosis present

## 2019-05-18 DIAGNOSIS — E785 Hyperlipidemia, unspecified: Secondary | ICD-10-CM | POA: Diagnosis present

## 2019-05-18 DIAGNOSIS — Z87891 Personal history of nicotine dependence: Secondary | ICD-10-CM | POA: Diagnosis not present

## 2019-05-18 DIAGNOSIS — M0589 Other rheumatoid arthritis with rheumatoid factor of multiple sites: Secondary | ICD-10-CM | POA: Diagnosis present

## 2019-05-18 DIAGNOSIS — E871 Hypo-osmolality and hyponatremia: Secondary | ICD-10-CM | POA: Diagnosis present

## 2019-05-18 DIAGNOSIS — Z79899 Other long term (current) drug therapy: Secondary | ICD-10-CM | POA: Diagnosis not present

## 2019-05-22 MED ORDER — LEVOTHYROXINE SODIUM 137 MCG PO TABS
137.00 | ORAL_TABLET | ORAL | Status: DC
Start: 2019-05-22 — End: 2019-05-22

## 2019-05-22 MED ORDER — HYDROXYCHLOROQUINE SULFATE 200 MG PO TABS
200.00 | ORAL_TABLET | ORAL | Status: DC
Start: 2019-05-22 — End: 2019-05-22

## 2019-05-22 MED ORDER — SODIUM CHLORIDE 0.9 % IV SOLN
10.00 | INTRAVENOUS | Status: DC
Start: ? — End: 2019-05-22

## 2019-05-22 MED ORDER — IPRATROPIUM-ALBUTEROL 0.5-2.5 (3) MG/3ML IN SOLN
3.00 | RESPIRATORY_TRACT | Status: DC
Start: ? — End: 2019-05-22

## 2019-05-22 MED ORDER — ATORVASTATIN CALCIUM 80 MG PO TABS
80.00 | ORAL_TABLET | ORAL | Status: DC
Start: 2019-05-22 — End: 2019-05-22

## 2019-05-22 MED ORDER — ASPIRIN 81 MG PO TBEC
81.00 | DELAYED_RELEASE_TABLET | ORAL | Status: DC
Start: 2019-05-22 — End: 2019-05-22

## 2019-05-22 MED ORDER — GENERIC EXTERNAL MEDICATION
Status: DC
Start: ? — End: 2019-05-22

## 2019-05-22 MED ORDER — DESVENLAFAXINE SUCCINATE ER 50 MG PO TB24
100.00 | ORAL_TABLET | ORAL | Status: DC
Start: 2019-05-22 — End: 2019-05-22

## 2019-05-22 MED ORDER — METHYLPREDNISOLONE SODIUM SUCC 40 MG IJ SOLR
40.00 | INTRAMUSCULAR | Status: DC
Start: 2019-05-21 — End: 2019-05-22

## 2019-05-22 MED ORDER — TRAZODONE HCL 100 MG PO TABS
100.00 | ORAL_TABLET | ORAL | Status: DC
Start: 2019-05-21 — End: 2019-05-22

## 2019-05-22 MED ORDER — GABAPENTIN 300 MG PO CAPS
600.00 | ORAL_CAPSULE | ORAL | Status: DC
Start: 2019-05-21 — End: 2019-05-22

## 2019-05-22 MED ORDER — INSULIN GLARGINE 100 UNIT/ML ~~LOC~~ SOLN
1.00 | SUBCUTANEOUS | Status: DC
Start: 2019-05-21 — End: 2019-05-22

## 2019-05-22 MED ORDER — MUPIROCIN 2 % EX OINT
TOPICAL_OINTMENT | CUTANEOUS | Status: DC
Start: 2019-05-21 — End: 2019-05-22

## 2019-05-22 MED ORDER — INSULIN LISPRO 100 UNIT/ML ~~LOC~~ SOLN
1.00 | SUBCUTANEOUS | Status: DC
Start: 2019-05-21 — End: 2019-05-22

## 2019-05-22 MED ORDER — ACETAMINOPHEN 325 MG PO TABS
650.00 | ORAL_TABLET | ORAL | Status: DC
Start: ? — End: 2019-05-22

## 2019-05-22 MED ORDER — POLYETHYLENE GLYCOL 3350 17 GM/SCOOP PO POWD
17.00 | ORAL | Status: DC
Start: ? — End: 2019-05-22

## 2019-05-22 MED ORDER — ALUM & MAG HYDROXIDE-SIMETH 200-200-20 MG/5ML PO SUSP
30.00 | ORAL | Status: DC
Start: ? — End: 2019-05-22

## 2019-05-22 MED ORDER — INSULIN LISPRO 100 UNIT/ML ~~LOC~~ SOLN
1.00 | SUBCUTANEOUS | Status: DC
Start: ? — End: 2019-05-22

## 2019-05-22 MED ORDER — GENERIC EXTERNAL MEDICATION
2.00 | Status: DC
Start: 2019-05-21 — End: 2019-05-22

## 2019-05-22 MED ORDER — METOPROLOL SUCCINATE ER 25 MG PO TB24
25.00 | ORAL_TABLET | ORAL | Status: DC
Start: 2019-05-22 — End: 2019-05-22

## 2019-05-22 MED ORDER — HEPARIN SODIUM (PORCINE) 5000 UNIT/ML IJ SOLN
5000.00 | INTRAMUSCULAR | Status: DC
Start: 2019-05-21 — End: 2019-05-22

## 2019-05-22 MED ORDER — NITROGLYCERIN 0.4 MG SL SUBL
0.40 | SUBLINGUAL_TABLET | SUBLINGUAL | Status: DC
Start: ? — End: 2019-05-22

## 2019-05-22 MED ORDER — NICOTINE 21 MG/24HR TD PT24
1.00 | MEDICATED_PATCH | TRANSDERMAL | Status: DC
Start: 2019-05-22 — End: 2019-05-22

## 2019-05-22 MED ORDER — INSULIN GLARGINE 100 UNIT/ML ~~LOC~~ SOLN
1.00 | SUBCUTANEOUS | Status: DC
Start: ? — End: 2019-05-22

## 2019-05-22 MED ORDER — BUPRENORPHINE HCL 8 MG SL SUBL
8.00 | SUBLINGUAL_TABLET | SUBLINGUAL | Status: DC
Start: 2019-05-22 — End: 2019-05-22

## 2019-05-29 ENCOUNTER — Inpatient Hospital Stay: Payer: Medicare Other | Admitting: Physician Assistant

## 2019-05-29 ENCOUNTER — Telehealth: Payer: Self-pay | Admitting: Neurology

## 2019-05-29 NOTE — Telephone Encounter (Signed)
Ok to see in person. Ok to cancel appt today so not charged.

## 2019-05-29 NOTE — Telephone Encounter (Signed)
Patient scheduled for virtual appt Thursday, she left vm stating she thinks she needs to be seen in person. Please advise. Hospital records in chart.

## 2019-05-30 NOTE — Telephone Encounter (Signed)
Appt changed with patient .

## 2019-05-31 ENCOUNTER — Ambulatory Visit (INDEPENDENT_AMBULATORY_CARE_PROVIDER_SITE_OTHER): Payer: Medicare Other | Admitting: Physician Assistant

## 2019-05-31 ENCOUNTER — Encounter: Payer: Self-pay | Admitting: Physician Assistant

## 2019-05-31 VITALS — Temp 100.0°F

## 2019-05-31 DIAGNOSIS — R6510 Systemic inflammatory response syndrome (SIRS) of non-infectious origin without acute organ dysfunction: Secondary | ICD-10-CM | POA: Diagnosis not present

## 2019-05-31 DIAGNOSIS — A419 Sepsis, unspecified organism: Secondary | ICD-10-CM

## 2019-05-31 DIAGNOSIS — Z7982 Long term (current) use of aspirin: Secondary | ICD-10-CM

## 2019-05-31 DIAGNOSIS — F17210 Nicotine dependence, cigarettes, uncomplicated: Secondary | ICD-10-CM

## 2019-05-31 MED ORDER — CEFDINIR 300 MG PO CAPS: 300.0000 mg | ORAL_CAPSULE | Freq: Two times a day (BID) | ORAL | 0 refills | Status: DC

## 2019-05-31 NOTE — Progress Notes (Signed)
Patient ID: [name], female   DOB: July 04, 1950, 68 y.o.   MRN: [id number] .Marland KitchenVirtual Visit via Telephone Note  I connected with [name] on 05/31/19 at  1:20 PM EST by telephone and verified that I am speaking with the correct person using two identifiers.  Location: Patient: home Provider: clinic   I discussed the limitations, risks, security and privacy concerns of performing an evaluation and management service by telephone and the availability of in person appointments. I also discussed with the patient that there may be a patient responsible charge related to this service. The patient expressed understanding and agreed to proceed.   History of Present Illness: Pt is a 68 yo female with COPD, HTN, CAD, hx of MI, RA who calls into the clinic for follow up after ED visit and admission on 05/18/2019 and discharge on 05/21/19.  From the admission H&P: [name] is a 68 y.o. female has a past medical history of AC (acromioclavicular) joint bone spurs, Allergic rhinitis (10/06/2010), Anxiety (03/12/2017), Arthritis, Benign hypertension (04/03/2011), Cervical spondylosis (05/18/2019), Chronic back pain, COPD (chronic obstructive pulmonary disease) (*), Depression, Diabetes mellitus (*), Disease of thyroid gland, Emphysema of lung (*), GERD (gastroesophageal reflux disease), Heart attack (*) (2016), Hypercalcemia (11/13/2009), Hyperlipidemia (01/22/2015), Lumbar spondylosis (05/18/2019), Osteoporosis (10/17/2007), Other chronic nonalcoholic liver disease (09/28/2007), Peripheral neuropathy (05/18/2019), Rheumatoid arthritis involving multiple sites with positive rheumatoid factor (*) (08/02/2017), Sacroiliitis (*) (09/14/2011), Smoker, and Vitamin D deficiency (04/05/2018). who presents with AMS/confusion and generalized weakness, c/o being here in ED for 3 days for left hip pain (prior c/o right hip pain by report to EDP), and is alert but oriented to name only (and hospital and dec 18 only) and  gives limited hx; found in ED to fit Sepsis criteria and hypotension now improved after 2L NS with normal lactic acid, WBC 19k, COVID neg, CK 1540 and mild low Na 131. Head CT/cxr neg, right knee/pelvis/right hip films neg. Cultures drawn, started on IV Vanc and cefipime and low dose levophed.  Pt now for admit for sepsis of unknown source with hypotension, AMS, mild low Na, and elevated CK in the setting of COPD, RA on chronic steroid, and DM.   Hospital Course:  Active Hospital Problems *Sepsis (*) -unknown source. Presented with fever 102.7, WBC 19.7, lactic acid normal 1.1, procalcitonin 0.95, and hypotension requiring vasopressors in the ICU. Vasopressors were weaned by the following day on 05/19/2019 and the patient was transferred out of the ICU. She received vancomycin, cefepime, and Solu-Medrol. WBC improved. CRP elevated at 310. The source of sepsis remained unknown up to the time of discharge. On the day of discharge, the patient demanded to be discharged home where she was going to leave AGAINST MEDICAL ADVICE. She was well informed and aware that the source of the sepsis was never determined. She is aware that the antibiotic Rx was empiric. She agreed to be discharged home on a 1 week course of oral cefdinir and a Medrol Dosepak to wean off her current IV corticosteroid. She confirms the need to follow-up with her PCP within the week.  Elevated CK -with normal serial troponin measurements.  Altered mental status -resolved.  COPD (chronic obstructive pulmonary disease) (*)  Diabetes mellitus (*) -acceptable range on current Rx.  Disease of thyroid gland  Smoker  Rheumatoid arthritis involving multiple sites with positive rheumatoid factor (*) -being treated with a number of immunosuppressives.  Benign hypertension  The patient was discharged home feeling much better and in  stable medical condition. Hypotension had completely resolved, in fact she was hypertensive again  in her usual hypertension medications were resumed. Discharge medications as noted above. Follow-up with PCP in 1 week.   Pt has finished omnicef. She is feeling better. Her temperature still runs 100 degrees and below. No cause for sepsis was identified. Needs labs rechecked.   .. Active Ambulatory Problems    Diagnosis Date Noted  . Mixed hyperlipidemia 09/28/2007  . HYPERCALCEMIA 11/13/2009  . MDD (major depressive disorder), recurrent episode, mild (HCC) 09/28/2007  . Chronic pain syndrome 06/05/2007  . FATTY LIVER DISEASE 09/28/2007  . ARTHRITIS, ACROMIOCLAVICULAR 07/31/2008  . SPINAL STENOSIS IN CERVICAL REGION 07/31/2008  . OSTEOPOROSIS 10/17/2007  . Elevated liver function tests 09/28/2007  . Allergic rhinitis 10/06/2010  . Chronic headache 04/03/2011  . Tobacco use 04/03/2011  . Chronic pain 04/03/2011  . Benign hypertension 04/03/2011  . Sacroiliitis (HCC) 09/14/2011  . Diabetes type 2, controlled (HCC) 03/14/2013  . Hypothyroidism 03/14/2013  . Trochanteric bursitis of right hip 05/04/2013  . Anxiety state 05/16/2013  . Ganglion cyst of wrist 07/13/2013  . Acute pain of left shoulder 09/23/2013  . COLD (chronic obstructive lung disease) (HCC) 12/06/2013  . Hyperlipidemia 01/22/2015  . 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 Myocardial infarct (HCC) 03/09/2017  . CAD (coronary artery disease) 03/09/2017  . Rotator cuff tendonitis, left 03/10/2017  . Anxiety 03/12/2017  . Left upper arm swelling secondary to rotator cuff tear and fluid tracking down biceps tendon sheath 04/14/2017  . Common bile duct dilatation 04/27/2017  . Lumbar degenerative disc disease 05/13/2017  . Abnormal finding on GI tract imaging 05/25/2017  . Chronic back pain 02/27/2016  . Atherosclerosis of native coronary artery of native heart without angina pectoris 03/16/2016  . RA (rheumatoid arthritis) (HCC) 07/06/2016  . Acquired hypothyroidism  12/01/2015  . Moderate episode of recurrent major depressive disorder (HCC) 07/24/2017  . Bilateral calf pain 07/24/2017  . Memory changes 07/24/2017  . Bilateral cataracts 09/07/2017  . Dry eye syndrome of bilateral lacrimal glands 09/07/2017  . Positive colorectal cancer screening using Cologuard test 10/07/2017  . Microalbuminuria 10/07/2017  . Weakness of both lower extremities 10/07/2017  . History of diverticulitis 11/21/2017  . Non-intractable vomiting with nausea 11/21/2017  . Left lower quadrant guarding 11/21/2017  . Generalized abdominal pain 11/21/2017  . Vasovagal syncope 12/16/2017  . Vitamin D deficiency 04/05/2018  . Primary insomnia 04/05/2018  . Hiatal hernia 04/05/2018  . Primary osteoarthritis 07/14/2018  . Gastroesophageal reflux disease with esophagitis 07/14/2018  . Fixed pupil of both eyes 07/18/2018  . Frequent headaches 07/18/2018  . Unsteady gait 07/18/2018  . Fatigue 12/11/2018  . Excessive sleepiness 05/15/2019  . No appetite 05/15/2019  . SIRS (systemic inflammatory response syndrome) (HCC) 06/04/2019  . Sepsis (HCC) 06/04/2019   Resolved Ambulatory Problems    Diagnosis Date Noted  . DM (diabetes mellitus) (HCC) 06/05/2007  . HEMORRHOIDS, WITH BLEEDING 04/22/2008  . Acute maxillary sinusitis 04/10/2010  . Alcoholic fatty liver 12/24/2009  . RENAL INSUFFICIENCY, ACUTE 10/23/2008  . ROTATOR CUFF SYNDROME, RIGHT 04/01/2008  . ALLERGIC REACTION 12/24/2009  . ELEVATED BLOOD PRESSURE WITHOUT DIAGNOSIS OF HYPERTENSION 05/13/2010  . CHRONIC OBSTRUCTIVE PULMONARY DISEASE, ACUTE EXACERBATION 08/14/2010  . Recurrent acute sinusitis 09/09/2010  . Allergic dermatitis due to poison ivy 11/27/2010  . DERMATITIS, ATOPIC 01/15/2011  . Arthritis of carpometacarpal joint 02/14/2012  . Ganglion cyst of left foot 07/13/2013  . Chronic pain syndrome 12/05/2013  .  Primary osteoarthritis of left wrist 12/10/2014  . Rheumatoid arthritis involving both hands ([place])  03/09/2017  . Bulging lumbar disc 02/27/2016  . Pyelonephritis 11/21/2017  . Upper abdominal pain 03/10/2018  . Patellar sleeve fracture, right, closed, initial encounter 04/24/2018   Past Medical History:  Diagnosis Date  . Cervicalgia   . COPD (chronic obstructive pulmonary disease) ([place])   . DDD (degenerative disc disease), lumbar   . Diabetes mellitus   . Fatty liver   . Narcotic abuse ([place])   . Osteoarthritis   . Osteoporosis   . Rheumatoid arthritis ([place])   . Thyroid disease         Observations/Objective: NO acute distress. Normal breathing on phone.   .. Today's Vitals   05/31/19 1238  Temp: 100 F (37.8 C)   There is no height or weight on file to calculate BMI.     Assessment and Plan: Marland KitchenMarland KitchenDiagnoses and all orders for this visit:  Sepsis, due to unspecified organism, unspecified whether acute organ dysfunction present ([place]) -     cefdinir (OMNICEF) 300 MG capsule; Take 1 capsule (300 mg total) by mouth 2 (two) times daily. For 5 more days. -     CBC w/Diff -     CK -     Procalcitonin -     BASIC METABOLIC PANEL WITH GFR  SIRS (systemic inflammatory response syndrome) (HCC)   Will get labs on Monday to see how changing from hospital. She is improving some but still has low grade fever. Concern that source of sepsis was never found. Will extend omnicef for 7 more days. Get labs. Follow up as needed or if fever or mental status worsens go to ED.    Follow Up Instructions:    I discussed the assessment and treatment plan with the patient. The patient was provided an opportunity to ask questions and all were answered. The patient agreed with the plan and demonstrated an understanding of the instructions.   The patient was advised to call back or seek an in-person evaluation if the symptoms worsen or if the condition fails to improve as anticipated.  I provided 25 minutes of non-face-to-face time during this encounter.   Iran Planas, PA-C

## 2019-05-31 NOTE — Progress Notes (Signed)
Called patient at 12:23 pm to start visit and it went to VM.

## 2019-06-04 ENCOUNTER — Encounter: Payer: Self-pay | Admitting: Physician Assistant

## 2019-06-04 DIAGNOSIS — A419 Sepsis, unspecified organism: Secondary | ICD-10-CM | POA: Insufficient documentation

## 2019-06-04 DIAGNOSIS — R6510 Systemic inflammatory response syndrome (SIRS) of non-infectious origin without acute organ dysfunction: Secondary | ICD-10-CM | POA: Insufficient documentation

## 2019-06-11 DIAGNOSIS — A419 Sepsis, unspecified organism: Secondary | ICD-10-CM | POA: Diagnosis not present

## 2019-06-12 NOTE — Progress Notes (Signed)
[name],   How are you feeling?   WBC stable and normal limits. CK normal. Sugars are still running really high compared to your normal. Normal kidney and liver function.   -Lesly Rubenstein

## 2019-06-13 NOTE — Progress Notes (Signed)
Organ function looks good. I really think at some point you have had covid and this could be some post viral fatigue. However, all test have been negative. No cause for fatigue. We could recheck CXR and urine to see if any infection somewhere to find that is causing this.

## 2019-06-14 LAB — CBC WITH DIFFERENTIAL/PLATELET
Absolute Monocytes: 842 cells/uL (ref 200–950)
Basophils Absolute: 62 cells/uL (ref 0–200)
Basophils Relative: 0.6 %
Eosinophils Absolute: 312 cells/uL (ref 15–500)
Eosinophils Relative: 3 %
HCT: 46.1 % — ABNORMAL HIGH (ref 35.0–45.0)
Hemoglobin: 15.5 g/dL (ref 11.7–15.5)
Lymphs Abs: 2278 cells/uL (ref 850–3900)
MCH: 31.1 pg (ref 27.0–33.0)
MCHC: 33.6 g/dL (ref 32.0–36.0)
MCV: 92.6 fL (ref 80.0–100.0)
MPV: 11.1 fL (ref 7.5–12.5)
Monocytes Relative: 8.1 %
Neutro Abs: 6906 cells/uL (ref 1500–7800)
Neutrophils Relative %: 66.4 %
Platelets: 292 10*3/uL (ref 140–400)
RBC: 4.98 10*6/uL (ref 3.80–5.10)
RDW: 12 % (ref 11.0–15.0)
Total Lymphocyte: 21.9 %
WBC: 10.4 10*3/uL (ref 3.8–10.8)

## 2019-06-14 LAB — BASIC METABOLIC PANEL WITH GFR
BUN: 11 mg/dL (ref 7–25)
CO2: 29 mmol/L (ref 20–32)
Calcium: 9.6 mg/dL (ref 8.6–10.4)
Chloride: 97 mmol/L — ABNORMAL LOW (ref 98–110)
Creat: 0.92 mg/dL (ref 0.50–0.99)
GFR, Est African American: 74 mL/min/{1.73_m2} (ref >=60)
GFR, Est Non African American: 64 mL/min/{1.73_m2} (ref >=60)
Glucose, Bld: 205 mg/dL — ABNORMAL HIGH (ref 65–99)
Potassium: 3.6 mmol/L (ref 3.5–5.3)
Sodium: 136 mmol/L (ref 135–146)

## 2019-06-14 LAB — CK: Total CK: 36 U/L (ref 29–143)

## 2019-06-14 LAB — PROCALCITONIN: Procalcitonin: 0.1 ng/mL — ABNORMAL HIGH (ref <0.10)

## 2019-06-15 ENCOUNTER — Other Ambulatory Visit: Payer: Self-pay | Admitting: Neurology

## 2019-06-15 DIAGNOSIS — J449 Chronic obstructive pulmonary disease, unspecified: Secondary | ICD-10-CM

## 2019-06-15 DIAGNOSIS — R6510 Systemic inflammatory response syndrome (SIRS) of non-infectious origin without acute organ dysfunction: Secondary | ICD-10-CM

## 2019-06-15 DIAGNOSIS — R5383 Other fatigue: Secondary | ICD-10-CM

## 2019-06-15 DIAGNOSIS — A419 Sepsis, unspecified organism: Secondary | ICD-10-CM

## 2019-06-15 NOTE — Progress Notes (Signed)
Patient called back and wants to have labs/CXR today. Orders placed.

## 2019-06-20 ENCOUNTER — Other Ambulatory Visit: Payer: Self-pay | Admitting: Physician Assistant

## 2019-06-20 DIAGNOSIS — R531 Weakness: Secondary | ICD-10-CM

## 2019-06-20 DIAGNOSIS — R059 Cough, unspecified: Secondary | ICD-10-CM

## 2019-06-20 DIAGNOSIS — R05 Cough: Secondary | ICD-10-CM

## 2019-06-20 NOTE — Progress Notes (Signed)
Order CT of chest and UA.

## 2019-06-21 DIAGNOSIS — R531 Weakness: Secondary | ICD-10-CM | POA: Diagnosis not present

## 2019-06-22 LAB — URINE CULTURE
MICRO NUMBER:: 10067510
SPECIMEN QUALITY:: ADEQUATE

## 2019-06-25 NOTE — Progress Notes (Signed)
Very small amt of isolated gram positive bacteria but so little we do not usually treat. ANY urinary symptoms or just weakness/fatigue?

## 2019-06-26 MED ORDER — AMOXICILLIN-POT CLAVULANATE 875-125 MG PO TABS: 1.0000 | ORAL_TABLET | Freq: Two times a day (BID) | ORAL | 0 refills | Status: DC

## 2019-06-26 NOTE — Progress Notes (Signed)
Augmentin sent due to evidence of some gram positive bacteria.

## 2019-07-05 ENCOUNTER — Telehealth: Payer: Self-pay | Admitting: Physician Assistant

## 2019-07-05 NOTE — Telephone Encounter (Signed)
Virtual appointment made for tomorrow with Dr. Mervyn Skeeters. AM

## 2019-07-05 NOTE — Telephone Encounter (Signed)
Patient called stating that she is almost out of her antibiotics that was prescribed at the last visit and states she is still sick/running a fever and wanted a call back regarding this, if she needed more medication or another appt, please advise, AM

## 2019-07-05 NOTE — Telephone Encounter (Signed)
She will need another virtual appt if still having SX.   Thanks

## 2019-07-09 ENCOUNTER — Other Ambulatory Visit: Payer: Self-pay | Admitting: Neurology

## 2019-07-09 DIAGNOSIS — E039 Hypothyroidism, unspecified: Secondary | ICD-10-CM

## 2019-07-09 MED ORDER — LEVOTHYROXINE SODIUM 137 MCG PO TABS: 137.0000 ug | ORAL_TABLET | Freq: Every day | ORAL | 1 refills | Status: DC

## 2019-07-09 NOTE — Progress Notes (Signed)
Patient requested RX for Levothyroxine to be sent to [facility]. RX sent.

## 2019-07-10 ENCOUNTER — Ambulatory Visit (INDEPENDENT_AMBULATORY_CARE_PROVIDER_SITE_OTHER): Payer: Medicare Other | Admitting: Physician Assistant

## 2019-07-10 VITALS — Temp 99.0°F | Ht 63.0 in | Wt 136.0 lb

## 2019-07-10 DIAGNOSIS — M79641 Pain in right hand: Secondary | ICD-10-CM

## 2019-07-10 DIAGNOSIS — M7989 Other specified soft tissue disorders: Secondary | ICD-10-CM

## 2019-07-10 DIAGNOSIS — M79642 Pain in left hand: Secondary | ICD-10-CM

## 2019-07-10 DIAGNOSIS — R5383 Other fatigue: Secondary | ICD-10-CM

## 2019-07-10 DIAGNOSIS — F17210 Nicotine dependence, cigarettes, uncomplicated: Secondary | ICD-10-CM

## 2019-07-10 DIAGNOSIS — Z7982 Long term (current) use of aspirin: Secondary | ICD-10-CM

## 2019-07-10 DIAGNOSIS — M05741 Rheumatoid arthritis with rheumatoid factor of right hand without organ or systems involvement: Secondary | ICD-10-CM | POA: Diagnosis not present

## 2019-07-10 DIAGNOSIS — M05742 Rheumatoid arthritis with rheumatoid factor of left hand without organ or systems involvement: Secondary | ICD-10-CM | POA: Diagnosis not present

## 2019-07-10 MED ORDER — PREDNISONE 20 MG PO TABS: ORAL_TABLET | ORAL | 0 refills | Status: DC

## 2019-07-10 NOTE — Progress Notes (Signed)
Patient having pain/swelling in hands last 3-4 days. She believes arthritis flare and asking for prednisone.

## 2019-07-16 ENCOUNTER — Encounter: Payer: Self-pay | Admitting: Physician Assistant

## 2019-07-16 DIAGNOSIS — M79641 Pain in right hand: Secondary | ICD-10-CM | POA: Insufficient documentation

## 2019-07-16 DIAGNOSIS — M79642 Pain in left hand: Secondary | ICD-10-CM | POA: Insufficient documentation

## 2019-07-16 DIAGNOSIS — M7989 Other specified soft tissue disorders: Secondary | ICD-10-CM | POA: Insufficient documentation

## 2019-07-16 NOTE — Progress Notes (Signed)
Patient ID: [name], female   DOB: [birth year], 69 y.o.   MRN: [id number] .Marland KitchenVirtual Visit via Telephone Note  I connected with [name] on 07/09/2019 at  4:20 PM EST by telephone and verified that I am speaking with the correct person using two identifiers.  Location: Patient: home Provider: clinic   I discussed the limitations, risks, security and privacy concerns of performing an evaluation and management service by telephone and the availability of in person appointments. I also discussed with the patient that there may be a patient responsible charge related to this service. The patient expressed understanding and agreed to proceed.   History of Present Illness: Pt is a 69 yo female who calls into the clinic with bilateral hand pain and swelling. She feels like this is an RA flare. She request prednisone. She has had RA for years and mostly controlled with plaquenil and methotrexate.   She has had recent bout of fatigue for last 2 months. No known reason. Labs have been good. Went to hospital and left early but suspected some sepsis. She has been treated with multiple different abx. Negative for covid. No known blood loss. She wonders about mono.   .. Active Ambulatory Problems    Diagnosis Date Noted  . Mixed hyperlipidemia 09/28/2007  . HYPERCALCEMIA 11/13/2009  . MDD (major depressive disorder), recurrent episode, mild ([place]) 09/28/2007  . Chronic pain syndrome 06/05/2007  . FATTY LIVER DISEASE 09/28/2007  . ARTHRITIS, ACROMIOCLAVICULAR 07/31/2008  . SPINAL STENOSIS IN CERVICAL REGION 07/31/2008  . OSTEOPOROSIS 10/17/2007  . Elevated liver function tests 09/28/2007  . Allergic rhinitis 10/06/2010  . Chronic headache 04/03/2011  . Tobacco use 04/03/2011  . Chronic pain 04/03/2011  . Benign hypertension 04/03/2011  . Sacroiliitis ([place]) 09/14/2011  . Diabetes type 2, controlled ([place]) 03/14/2013  . Hypothyroidism 03/14/2013  . Trochanteric bursitis of right hip 05/04/2013   . Anxiety state 05/16/2013  . Ganglion cyst of wrist 07/13/2013  . Acute pain of left shoulder 09/23/2013  . COLD (chronic obstructive lung disease) ([place]) 12/06/2013  . Hyperlipidemia 01/22/2015  . COPD (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 ([place]) 03/05/2015  . Myocardial infarct ([place]) 03/09/2017  . CAD (coronary artery disease) 03/09/2017  . Rotator cuff tendonitis, left 03/10/2017  . Anxiety 03/12/2017  . Left upper arm swelling secondary to rotator cuff tear and fluid tracking down biceps tendon sheath 04/14/2017  . Common bile duct dilatation 04/27/2017  . Lumbar degenerative disc disease 05/13/2017  . Abnormal finding on GI tract imaging 05/25/2017  . Chronic back pain 02/27/2016  . Atherosclerosis of native coronary artery of native heart without angina pectoris 03/16/2016  . RA (rheumatoid arthritis) ([place]) 07/06/2016  . Acquired hypothyroidism 12/01/2015  . Moderate episode of recurrent major depressive disorder ([place]) 07/24/2017  . Bilateral calf pain 07/24/2017  . Memory changes 07/24/2017  . Bilateral cataracts 09/07/2017  . Dry eye syndrome of bilateral lacrimal glands 09/07/2017  . Positive colorectal cancer screening using Cologuard test 10/07/2017  . Microalbuminuria 10/07/2017  . Weakness of both lower extremities 10/07/2017  . History of diverticulitis 11/21/2017  . Non-intractable vomiting with nausea 11/21/2017  . Left lower quadrant guarding 11/21/2017  . Generalized abdominal pain 11/21/2017  . Vasovagal syncope 12/16/2017  . Vitamin D deficiency 04/05/2018  . Primary insomnia 04/05/2018  . Hiatal hernia 04/05/2018  . Primary osteoarthritis 07/14/2018  . Gastroesophageal reflux disease with esophagitis 07/14/2018  . Fixed pupil of both eyes 07/18/2018  . Frequent headaches 07/18/2018  .  Unsteady gait 07/18/2018  . Fatigue 12/11/2018  . Excessive sleepiness 05/15/2019  . No appetite 05/15/2019  . SIRS (systemic inflammatory  response syndrome) (HCC) 06/04/2019  . Sepsis (HCC) 06/04/2019  . Bilateral hand swelling 07/16/2019  . Bilateral hand pain 07/16/2019   Resolved Ambulatory Problems    Diagnosis Date Noted  . DM (diabetes mellitus) (HCC) 06/05/2007  . HEMORRHOIDS, WITH BLEEDING 04/22/2008  . Acute maxillary sinusitis 04/10/2010  . Alcoholic fatty liver 12/24/2009  . RENAL INSUFFICIENCY, ACUTE 10/23/2008  . ROTATOR CUFF SYNDROME, RIGHT 04/01/2008  . ALLERGIC REACTION 12/24/2009  . ELEVATED BLOOD PRESSURE WITHOUT DIAGNOSIS OF HYPERTENSION 05/13/2010  . CHRONIC OBSTRUCTIVE PULMONARY DISEASE, ACUTE EXACERBATION 08/14/2010  . Recurrent acute sinusitis 09/09/2010  . Allergic dermatitis due to poison ivy 11/27/2010  . DERMATITIS, ATOPIC 01/15/2011  . Arthritis of carpometacarpal joint 02/14/2012  . Ganglion cyst of left foot 07/13/2013  . Chronic pain syndrome 12/05/2013  . Primary osteoarthritis of left wrist 12/10/2014  . Rheumatoid arthritis involving both hands (HCC) 03/09/2017  . Bulging lumbar disc 02/27/2016  . Pyelonephritis 11/21/2017  . Upper abdominal pain 03/10/2018  . Patellar sleeve fracture, right, closed, initial encounter 04/24/2018   Past Medical History:  Diagnosis Date  . Cervicalgia   . COPD (chronic obstructive pulmonary disease) (HCC)   . DDD (degenerative disc disease), lumbar   . Diabetes mellitus   . Fatty liver   . Narcotic abuse (HCC)   . Osteoarthritis   . Osteoporosis   . Rheumatoid arthritis (HCC)   . Thyroid disease    Reviewed med, allergy, problem list.     Observations/Objective: No acute distress. Normal breathing.   .. Today's Vitals   07/10/19 1140  Temp: 99 F (37.2 C)  TempSrc: Oral  Weight: 136 lb (61.7 kg)  Height: 5\' 3"  (1.6 m)   Body mass index is 24.09 kg/m.    Assessment and Plan: Marland KitchenDiane was seen today for arthritis.  Diagnoses and all orders for this visit:  Bilateral hand swelling -     predniSONE (DELTASONE) 20 MG tablet;  Take 3 tablets for 3 days, take 2 tablets for 3 days, take 1 tablet for 3 days, take 1/2 tablet for 4 days then continue on 5mg  maintanance dose.  Fatigue, unspecified type -     Epstein-Barr virus VCA antibody panel -     CMV abs, IgG+IgM (cytomegalovirus) -     CBC -     SAR CoV2 Serology (COVID 19)AB(IGG)IA -     Basic metabolic panel  Rheumatoid arthritis involving both hands with positive rheumatoid factor (HCC) -     predniSONE (DELTASONE) 20 MG tablet; Take 3 tablets for 3 days, take 2 tablets for 3 days, take 1 tablet for 3 days, take 1/2 tablet for 4 days then continue on 5mg  maintanance dose.  Bilateral hand pain -     predniSONE (DELTASONE) 20 MG tablet; Take 3 tablets for 3 days, take 2 tablets for 3 days, take 1 tablet for 3 days, take 1/2 tablet for 4 days then continue on 5mg  maintanance dose.   Treated with burst of prednisone. Discussed follow up with rheumatologist if joint pain worsening.   Fatigue labs ordered to evaluate more.     Follow Up Instructions:    I discussed the assessment and treatment plan with the patient. The patient was provided an opportunity to ask questions and all were answered. The patient agreed with the plan and demonstrated an understanding of the instructions.   The  patient was advised to call back or seek an in-person evaluation if the symptoms worsen or if the condition fails to improve as anticipated.  I provided 10 minutes of non-face-to-face time during this encounter.   Tandy Gaw, PA-C

## 2019-07-17 ENCOUNTER — Telehealth (INDEPENDENT_AMBULATORY_CARE_PROVIDER_SITE_OTHER): Payer: Medicare Other | Admitting: Physician Assistant

## 2019-07-17 VITALS — Temp 99.0°F | Ht 63.0 in | Wt 136.0 lb

## 2019-07-17 DIAGNOSIS — R5383 Other fatigue: Secondary | ICD-10-CM | POA: Diagnosis not present

## 2019-07-17 DIAGNOSIS — R509 Fever, unspecified: Secondary | ICD-10-CM | POA: Diagnosis not present

## 2019-07-17 DIAGNOSIS — Z7982 Long term (current) use of aspirin: Secondary | ICD-10-CM

## 2019-07-17 DIAGNOSIS — Z8619 Personal history of other infectious and parasitic diseases: Secondary | ICD-10-CM | POA: Diagnosis not present

## 2019-07-17 DIAGNOSIS — R531 Weakness: Secondary | ICD-10-CM | POA: Diagnosis not present

## 2019-07-17 DIAGNOSIS — E039 Hypothyroidism, unspecified: Secondary | ICD-10-CM

## 2019-07-17 DIAGNOSIS — F17210 Nicotine dependence, cigarettes, uncomplicated: Secondary | ICD-10-CM

## 2019-07-17 NOTE — Progress Notes (Signed)
Still having chills/low grade fever No other symptoms

## 2019-07-17 NOTE — Progress Notes (Signed)
Patient ID: [name], female   DOB: 1950/11/03, 69 y.o.   MRN: [id number] .Marland KitchenVirtual Visit via Video Note  I connected with [name] on 07/17/2019 at 10:10 AM EST by a video enabled telemedicine application and verified that I am speaking with the correct person using two identifiers.  Location: Patient: home Provider: clinic   I discussed the limitations of evaluation and management by telemedicine and the availability of in person appointments. The patient expressed understanding and agreed to proceed.  History of Present Illness: Pt is a 69 yo female with COPD, CAD, hx of MI, hypothyroidism, RA, chronic pain, MDD, GAD who calls into the clinic concerned about ongoing weakness/fatigue/fever. She has felt this way for almost 2.5 months now. She went to the ED at one point and was dx with sepsis and systemic inflammatory response syndrome. She left AMA before and source of infection was found.   Pt denies any worsening SOB, cough, headache, CP, abdominal pain, bowel changes, melena, hematochezia, increase in pain, swelling, rashes, sinus pressure, skin changes or abrasions. Pt has not had any recent medication changes. No other sick contacts. Negative for covid when checked.   She wants to sleep all day long and has no energy to even eat. Denies any depression symptoms. She is running fevers off and on 99-100. She admits to sweats here and there.   .. Active Ambulatory Problems    Diagnosis Date Noted  . Mixed hyperlipidemia 09/28/2007  . HYPERCALCEMIA 11/13/2009  . MDD (major depressive disorder), recurrent episode, mild ([place]) 09/28/2007  . Chronic pain syndrome 06/05/2007  . FATTY LIVER DISEASE 09/28/2007  . ARTHRITIS, ACROMIOCLAVICULAR 07/31/2008  . SPINAL STENOSIS IN CERVICAL REGION 07/31/2008  . OSTEOPOROSIS 10/17/2007  . Elevated liver function tests 09/28/2007  . Allergic rhinitis 10/06/2010  . Chronic headache 04/03/2011  . Tobacco use 04/03/2011  . Chronic pain  04/03/2011  . Benign hypertension 04/03/2011  . Sacroiliitis ([place]) 09/14/2011  . Diabetes type 2, controlled ([place]) 03/14/2013  . Hypothyroidism 03/14/2013  . Trochanteric bursitis of right hip 05/04/2013  . Anxiety state 05/16/2013  . Ganglion cyst of wrist 07/13/2013  . Acute pain of left shoulder 09/23/2013  . COLD (chronic obstructive lung disease) ([place]) 12/06/2013  . Hyperlipidemia 01/22/2015  . COPD (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 ([place]) 03/05/2015  . Myocardial infarct ([place]) 03/09/2017  . CAD (coronary artery disease) 03/09/2017  . Rotator cuff tendonitis, left 03/10/2017  . Anxiety 03/12/2017  . Left upper arm swelling secondary to rotator cuff tear and fluid tracking down biceps tendon sheath 04/14/2017  . Common bile duct dilatation 04/27/2017  . Lumbar degenerative disc disease 05/13/2017  . Abnormal finding on GI tract imaging 05/25/2017  . Chronic back pain 02/27/2016  . Atherosclerosis of native coronary artery of native heart without angina pectoris 03/16/2016  . RA (rheumatoid arthritis) ([place]) 07/06/2016  . Acquired hypothyroidism 12/01/2015  . Moderate episode of recurrent major depressive disorder ([place]) 07/24/2017  . Bilateral calf pain 07/24/2017  . Memory changes 07/24/2017  . Bilateral cataracts 09/07/2017  . Dry eye syndrome of bilateral lacrimal glands 09/07/2017  . Positive colorectal cancer screening using Cologuard test 10/07/2017  . Microalbuminuria 10/07/2017  . Weakness of both lower extremities 10/07/2017  . History of diverticulitis 11/21/2017  . Non-intractable vomiting with nausea 11/21/2017  . Left lower quadrant guarding 11/21/2017  . Generalized abdominal pain 11/21/2017  . Vasovagal syncope 12/16/2017  . Vitamin D deficiency 04/05/2018  . Primary insomnia 04/05/2018  .  Hiatal hernia 04/05/2018  . Primary osteoarthritis 07/14/2018  . Gastroesophageal reflux disease with esophagitis 07/14/2018  . Fixed pupil of  both eyes 07/18/2018  . Frequent headaches 07/18/2018  . Unsteady gait 07/18/2018  . Fatigue 12/11/2018  . Excessive sleepiness 05/15/2019  . No appetite 05/15/2019  . SIRS (systemic inflammatory response syndrome) (HCC) 06/04/2019  . Sepsis (HCC) 06/04/2019  . Bilateral hand swelling 07/16/2019  . Bilateral hand pain 07/16/2019   Resolved Ambulatory Problems    Diagnosis Date Noted  . DM (diabetes mellitus) (HCC) 06/05/2007  . HEMORRHOIDS, WITH BLEEDING 04/22/2008  . Acute maxillary sinusitis 04/10/2010  . Alcoholic fatty liver 12/24/2009  . RENAL INSUFFICIENCY, ACUTE 10/23/2008  . ROTATOR CUFF SYNDROME, RIGHT 04/01/2008  . ALLERGIC REACTION 12/24/2009  . ELEVATED BLOOD PRESSURE WITHOUT DIAGNOSIS OF HYPERTENSION 05/13/2010  . CHRONIC OBSTRUCTIVE PULMONARY DISEASE, ACUTE EXACERBATION 08/14/2010  . Recurrent acute sinusitis 09/09/2010  . Allergic dermatitis due to poison ivy 11/27/2010  . DERMATITIS, ATOPIC 01/15/2011  . Arthritis of carpometacarpal joint 02/14/2012  . Ganglion cyst of left foot 07/13/2013  . Chronic pain syndrome 12/05/2013  . Primary osteoarthritis of left wrist 12/10/2014  . Rheumatoid arthritis involving both hands (HCC) 03/09/2017  . Bulging lumbar disc 02/27/2016  . Pyelonephritis 11/21/2017  . Upper abdominal pain 03/10/2018  . Patellar sleeve fracture, right, closed, initial encounter 04/24/2018   Past Medical History:  Diagnosis Date  . Cervicalgia   . COPD (chronic obstructive pulmonary disease) (HCC)   . DDD (degenerative disc disease), lumbar   . Diabetes mellitus   . Fatty liver   . Narcotic abuse (HCC)   . Osteoarthritis   . Osteoporosis   . Rheumatoid arthritis (HCC)   . Thyroid disease    Reviewed med, allergy, problem list.       Observations/Objective: No acute distress. No labored breathing.  Flat affect.  Pale weak appearance.   .. Today's Vitals   07/17/19 0940  Temp: 99 F (37.2 C)  TempSrc: Oral  Weight: 136 lb  (61.7 kg)  Height: 5\' 3"  (1.6 m)   Body mass index is 24.09 kg/m.    Assessment and Plan: Marland KitchenDiane was seen today for illness.  Diagnoses and all orders for this visit:  Fever, unspecified fever cause -     Blood culture (routine single) -     Lactate dehydrogenase -     CBC with Differential/Platelet -     Procalcitonin -     COMPLETE METABOLIC PANEL WITH GFR -     TSH -     Epstein-Barr virus VCA antibody panel -     CMV abs, IgG+IgM (cytomegalovirus) -     SAR CoV2 Serology (COVID 19)AB(IGG)IA -     Urine Culture -     Protein, Total and Protein Electrophoresis, Random Urine (REFL)  Weakness -     Blood culture (routine single) -     Lactate dehydrogenase -     CBC with Differential/Platelet -     Procalcitonin -     COMPLETE METABOLIC PANEL WITH GFR -     TSH -     Epstein-Barr virus VCA antibody panel -     CMV abs, IgG+IgM (cytomegalovirus) -     SAR CoV2 Serology (COVID 19)AB(IGG)IA -     Urine Culture -     Protein, Total and Protein Electrophoresis, Random Urine (REFL)  Fatigue, unspecified type -     Blood culture (routine single) -     Lactate dehydrogenase -  CBC with Differential/Platelet -     Procalcitonin -     COMPLETE METABOLIC PANEL WITH GFR -     TSH -     Epstein-Barr virus VCA antibody panel -     CMV abs, IgG+IgM (cytomegalovirus) -     SAR CoV2 Serology (COVID 19)AB(IGG)IA -     Urine Culture -     Protein, Total and Protein Electrophoresis, Random Urine (REFL)  History of sepsis -     Blood culture (routine single) -     Lactate dehydrogenase -     CBC with Differential/Platelet -     Procalcitonin -     COMPLETE METABOLIC PANEL WITH GFR -     TSH -     Epstein-Barr virus VCA antibody panel -     CMV abs, IgG+IgM (cytomegalovirus) -     SAR CoV2 Serology (COVID 19)AB(IGG)IA -     Urine Culture -     Protein, Total and Protein Electrophoresis, Random Urine (REFL)   She did not get her labs ordered from last visit. Ordered  more labs today to look at sepsis, viral infections, thyroid abnormalities, anemia, malignancy.  GET these labs done ASAP so we can see if we can better treat.    Follow Up Instructions:    I discussed the assessment and treatment plan with the patient. The patient was provided an opportunity to ask questions and all were answered. The patient agreed with the plan and demonstrated an understanding of the instructions.   The patient was advised to call back or seek an in-person evaluation if the symptoms worsen or if the condition fails to improve as anticipated.  I provided 13 minutes of non-face-to-face time during this encounter.   Tandy Gaw, PA-C

## 2019-07-18 ENCOUNTER — Encounter: Payer: Self-pay | Admitting: Physician Assistant

## 2019-07-18 NOTE — Progress Notes (Signed)
First round of labs some have not resulted:  No WBC elevation. Less likely infection causing weakness/fatigue.  No anemia.   Its your thyroid! It has shot up to 55.83. are you for sure taking your medication? Taking alone in morning before food or other medications?

## 2019-07-19 NOTE — Progress Notes (Signed)
New lab update:  No antibodies to covid.  No bacteria to treat in urine culture.   Side if you only missed a few tablets of thyroid medication it really should not still be that high. I am quite sure the TSH of 55 is what is making you feel terrible. Confirm taking now the daily  if so I do want to send over increase to make up the gap in tsh level. Make sure taking in the morning 30 minutes before you eat or drink and before any medications. Recheck labs in 4 weeks.

## 2019-07-20 ENCOUNTER — Other Ambulatory Visit: Payer: Self-pay | Admitting: Physician Assistant

## 2019-07-20 DIAGNOSIS — M5136 Other intervertebral disc degeneration, lumbar region: Secondary | ICD-10-CM

## 2019-07-20 MED ORDER — LEVOTHYROXINE SODIUM 150 MCG PO TABS: 150.0000 ug | ORAL_TABLET | Freq: Every day | ORAL | 1 refills | Status: DC

## 2019-07-20 NOTE — Addendum Note (Signed)
Addended byJomarie Longs on: 07/20/2019 02:50 PM   Modules accepted: Orders

## 2019-07-25 LAB — EPSTEIN-BARR VIRUS VCA ANTIBODY PANEL
EBV NA IgG: 204 U/mL — ABNORMAL HIGH
EBV VCA IgG: 698 U/mL — ABNORMAL HIGH
EBV VCA IgM: <36 U/mL

## 2019-07-25 LAB — COMPLETE METABOLIC PANEL WITH GFR
AG Ratio: 1.4 (calc) (ref 1.0–2.5)
ALT: 8 U/L (ref 6–29)
AST: 10 U/L (ref 10–35)
Albumin: 4 g/dL (ref 3.6–5.1)
Alkaline phosphatase (APISO): 67 U/L (ref 37–153)
BUN: 16 mg/dL (ref 7–25)
CO2: 28 mmol/L (ref 20–32)
Calcium: 9.9 mg/dL (ref 8.6–10.4)
Chloride: 97 mmol/L — ABNORMAL LOW (ref 98–110)
Creat: 0.74 mg/dL (ref 0.50–0.99)
GFR, Est African American: 96 mL/min/{1.73_m2} (ref >=60)
GFR, Est Non African American: 83 mL/min/{1.73_m2} (ref >=60)
Globulin: 2.8 g/dL (calc) (ref 1.9–3.7)
Glucose, Bld: 175 mg/dL — ABNORMAL HIGH (ref 65–139)
Potassium: 3.7 mmol/L (ref 3.5–5.3)
Sodium: 136 mmol/L (ref 135–146)
Total Bilirubin: 0.3 mg/dL (ref 0.2–1.2)
Total Protein: 6.8 g/dL (ref 6.1–8.1)

## 2019-07-25 LAB — PROTEIN,TOTAL AND PROTEIN ELECTROPHORESIS, RANDOM URINE(REFL)
Creatinine, Urine: 20 mg/dL (ref 20–275)
Total Protein, Urine: <4 mg/dL — ABNORMAL LOW (ref 5–24)

## 2019-07-25 LAB — CBC WITH DIFFERENTIAL/PLATELET
Absolute Monocytes: 454 cells/uL (ref 200–950)
Basophils Absolute: 54 cells/uL (ref 0–200)
Basophils Relative: 0.5 %
Eosinophils Absolute: 151 cells/uL (ref 15–500)
Eosinophils Relative: 1.4 %
HCT: 41.8 % (ref 35.0–45.0)
Hemoglobin: 14.2 g/dL (ref 11.7–15.5)
Lymphs Abs: 3704 cells/uL (ref 850–3900)
MCH: 31 pg (ref 27.0–33.0)
MCHC: 34 g/dL (ref 32.0–36.0)
MCV: 91.3 fL (ref 80.0–100.0)
MPV: 10.5 fL (ref 7.5–12.5)
Monocytes Relative: 4.2 %
Neutro Abs: 6437 cells/uL (ref 1500–7800)
Neutrophils Relative %: 59.6 %
Platelets: 329 10*3/uL (ref 140–400)
RBC: 4.58 10*6/uL (ref 3.80–5.10)
RDW: 12.6 % (ref 11.0–15.0)
Total Lymphocyte: 34.3 %
WBC: 10.8 10*3/uL (ref 3.8–10.8)

## 2019-07-25 LAB — URINE CULTURE
MICRO NUMBER:: 10157009
SPECIMEN QUALITY:: ADEQUATE

## 2019-07-25 LAB — LACTATE DEHYDROGENASE: LDH: 132 U/L (ref 120–250)

## 2019-07-25 LAB — TSH: TSH: 55.83 mIU/L — ABNORMAL HIGH (ref 0.40–4.50)

## 2019-07-25 LAB — PROCALCITONIN: Procalcitonin: <0.1 ng/mL (ref <0.10)

## 2019-07-25 LAB — CULTURE BLOOD MANUAL
Micro Number: 10159385
Result: NO GROWTH
Specimen Quality: ADEQUATE

## 2019-07-25 LAB — CMV ABS, IGG+IGM (CYTOMEGALOVIRUS)
CMV IgM: <30 AU/mL
Cytomegalovirus Ab-IgG: <0.6 U/mL

## 2019-07-25 LAB — SAR COV2 SEROLOGY (COVID19)AB(IGG),IA: SARS CoV2 AB IGG: NEGATIVE

## 2019-07-30 ENCOUNTER — Other Ambulatory Visit: Payer: Self-pay | Admitting: Physician Assistant

## 2019-07-30 DIAGNOSIS — M5136 Other intervertebral disc degeneration, lumbar region: Secondary | ICD-10-CM

## 2019-08-11 DIAGNOSIS — K5989 Other specified functional intestinal disorders: Secondary | ICD-10-CM | POA: Diagnosis not present

## 2019-08-11 DIAGNOSIS — S0990XA Unspecified injury of head, initial encounter: Secondary | ICD-10-CM | POA: Diagnosis not present

## 2019-08-11 DIAGNOSIS — R109 Unspecified abdominal pain: Secondary | ICD-10-CM | POA: Diagnosis not present

## 2019-08-11 DIAGNOSIS — J9811 Atelectasis: Secondary | ICD-10-CM | POA: Diagnosis not present

## 2019-08-11 DIAGNOSIS — F419 Anxiety disorder, unspecified: Secondary | ICD-10-CM | POA: Diagnosis not present

## 2019-08-11 DIAGNOSIS — Z9181 History of falling: Secondary | ICD-10-CM | POA: Diagnosis not present

## 2019-08-11 DIAGNOSIS — R42 Dizziness and giddiness: Secondary | ICD-10-CM | POA: Diagnosis not present

## 2019-08-11 DIAGNOSIS — M47812 Spondylosis without myelopathy or radiculopathy, cervical region: Secondary | ICD-10-CM | POA: Diagnosis not present

## 2019-08-11 DIAGNOSIS — M542 Cervicalgia: Secondary | ICD-10-CM | POA: Diagnosis not present

## 2019-08-11 DIAGNOSIS — K838 Other specified diseases of biliary tract: Secondary | ICD-10-CM | POA: Diagnosis not present

## 2019-08-11 DIAGNOSIS — T40495A Adverse effect of other synthetic narcotics, initial encounter: Secondary | ICD-10-CM | POA: Diagnosis not present

## 2019-08-11 DIAGNOSIS — T424X5A Adverse effect of benzodiazepines, initial encounter: Secondary | ICD-10-CM | POA: Diagnosis not present

## 2019-08-11 DIAGNOSIS — G8911 Acute pain due to trauma: Secondary | ICD-10-CM | POA: Diagnosis not present

## 2019-08-11 DIAGNOSIS — T426X5A Adverse effect of other antiepileptic and sedative-hypnotic drugs, initial encounter: Secondary | ICD-10-CM | POA: Diagnosis not present

## 2019-08-11 DIAGNOSIS — Z20822 Contact with and (suspected) exposure to covid-19: Secondary | ICD-10-CM | POA: Diagnosis not present

## 2019-08-11 DIAGNOSIS — R0902 Hypoxemia: Secondary | ICD-10-CM | POA: Diagnosis not present

## 2019-08-11 DIAGNOSIS — J449 Chronic obstructive pulmonary disease, unspecified: Secondary | ICD-10-CM | POA: Diagnosis not present

## 2019-08-11 DIAGNOSIS — I2510 Atherosclerotic heart disease of native coronary artery without angina pectoris: Secondary | ICD-10-CM | POA: Diagnosis not present

## 2019-08-11 DIAGNOSIS — I10 Essential (primary) hypertension: Secondary | ICD-10-CM | POA: Diagnosis not present

## 2019-08-11 DIAGNOSIS — R4182 Altered mental status, unspecified: Secondary | ICD-10-CM | POA: Diagnosis not present

## 2019-08-11 DIAGNOSIS — S199XXA Unspecified injury of neck, initial encounter: Secondary | ICD-10-CM | POA: Diagnosis not present

## 2019-08-11 DIAGNOSIS — J9601 Acute respiratory failure with hypoxia: Secondary | ICD-10-CM | POA: Diagnosis not present

## 2019-08-12 DIAGNOSIS — Z79899 Other long term (current) drug therapy: Secondary | ICD-10-CM | POA: Diagnosis not present

## 2019-08-12 DIAGNOSIS — E119 Type 2 diabetes mellitus without complications: Secondary | ICD-10-CM | POA: Diagnosis present

## 2019-08-12 DIAGNOSIS — G8929 Other chronic pain: Secondary | ICD-10-CM | POA: Diagnosis present

## 2019-08-12 DIAGNOSIS — R4182 Altered mental status, unspecified: Secondary | ICD-10-CM | POA: Diagnosis not present

## 2019-08-12 DIAGNOSIS — Z885 Allergy status to narcotic agent status: Secondary | ICD-10-CM | POA: Diagnosis not present

## 2019-08-12 DIAGNOSIS — M47816 Spondylosis without myelopathy or radiculopathy, lumbar region: Secondary | ICD-10-CM | POA: Diagnosis present

## 2019-08-12 DIAGNOSIS — Z9181 History of falling: Secondary | ICD-10-CM | POA: Diagnosis not present

## 2019-08-12 DIAGNOSIS — M47812 Spondylosis without myelopathy or radiculopathy, cervical region: Secondary | ICD-10-CM | POA: Diagnosis not present

## 2019-08-12 DIAGNOSIS — T426X5A Adverse effect of other antiepileptic and sedative-hypnotic drugs, initial encounter: Secondary | ICD-10-CM | POA: Diagnosis present

## 2019-08-12 DIAGNOSIS — Z20822 Contact with and (suspected) exposure to covid-19: Secondary | ICD-10-CM | POA: Diagnosis present

## 2019-08-12 DIAGNOSIS — Z886 Allergy status to analgesic agent status: Secondary | ICD-10-CM | POA: Diagnosis not present

## 2019-08-12 DIAGNOSIS — T424X5A Adverse effect of benzodiazepines, initial encounter: Secondary | ICD-10-CM | POA: Diagnosis present

## 2019-08-12 DIAGNOSIS — E785 Hyperlipidemia, unspecified: Secondary | ICD-10-CM | POA: Diagnosis present

## 2019-08-12 DIAGNOSIS — Z7982 Long term (current) use of aspirin: Secondary | ICD-10-CM | POA: Diagnosis not present

## 2019-08-12 DIAGNOSIS — F329 Major depressive disorder, single episode, unspecified: Secondary | ICD-10-CM | POA: Diagnosis present

## 2019-08-12 DIAGNOSIS — J9601 Acute respiratory failure with hypoxia: Secondary | ICD-10-CM | POA: Diagnosis not present

## 2019-08-12 DIAGNOSIS — M0579 Rheumatoid arthritis with rheumatoid factor of multiple sites without organ or systems involvement: Secondary | ICD-10-CM | POA: Diagnosis present

## 2019-08-12 DIAGNOSIS — Z888 Allergy status to other drugs, medicaments and biological substances status: Secondary | ICD-10-CM | POA: Diagnosis not present

## 2019-08-12 DIAGNOSIS — T40495A Adverse effect of other synthetic narcotics, initial encounter: Secondary | ICD-10-CM | POA: Diagnosis present

## 2019-08-12 DIAGNOSIS — E039 Hypothyroidism, unspecified: Secondary | ICD-10-CM | POA: Diagnosis present

## 2019-08-12 DIAGNOSIS — J449 Chronic obstructive pulmonary disease, unspecified: Secondary | ICD-10-CM | POA: Diagnosis present

## 2019-08-12 DIAGNOSIS — I10 Essential (primary) hypertension: Secondary | ICD-10-CM | POA: Diagnosis not present

## 2019-08-12 DIAGNOSIS — F419 Anxiety disorder, unspecified: Secondary | ICD-10-CM | POA: Diagnosis not present

## 2019-08-12 DIAGNOSIS — K219 Gastro-esophageal reflux disease without esophagitis: Secondary | ICD-10-CM | POA: Diagnosis present

## 2019-08-20 ENCOUNTER — Ambulatory Visit: Payer: Medicare Other | Admitting: Physician Assistant

## 2019-08-24 ENCOUNTER — Encounter: Payer: Self-pay | Admitting: Physician Assistant

## 2019-08-24 ENCOUNTER — Other Ambulatory Visit: Payer: Self-pay

## 2019-08-24 ENCOUNTER — Ambulatory Visit (INDEPENDENT_AMBULATORY_CARE_PROVIDER_SITE_OTHER): Payer: Medicare Other | Admitting: Physician Assistant

## 2019-08-24 VITALS — BP 128/59 | HR 100 | Ht 63.0 in | Wt 128.0 lb

## 2019-08-24 DIAGNOSIS — R4182 Altered mental status, unspecified: Secondary | ICD-10-CM | POA: Diagnosis not present

## 2019-08-24 DIAGNOSIS — G894 Chronic pain syndrome: Secondary | ICD-10-CM | POA: Diagnosis not present

## 2019-08-24 DIAGNOSIS — J449 Chronic obstructive pulmonary disease, unspecified: Secondary | ICD-10-CM | POA: Diagnosis not present

## 2019-08-24 DIAGNOSIS — J441 Chronic obstructive pulmonary disease with (acute) exacerbation: Secondary | ICD-10-CM

## 2019-08-24 DIAGNOSIS — E119 Type 2 diabetes mellitus without complications: Secondary | ICD-10-CM

## 2019-08-24 DIAGNOSIS — E039 Hypothyroidism, unspecified: Secondary | ICD-10-CM

## 2019-08-24 DIAGNOSIS — J4489 Other specified chronic obstructive pulmonary disease: Secondary | ICD-10-CM

## 2019-08-24 MED ORDER — PREDNISONE 20 MG PO TABS: ORAL_TABLET | ORAL | 0 refills | Status: DC

## 2019-08-24 NOTE — Progress Notes (Signed)
Subjective:    Patient ID: [name], female    DOB: 10-Oct-1950, 69 y.o.   MRN: [id number]  HPI  Pt is a 69 yo female who presents to the clinic for hospital follow up for altered mental status on 08/11/2019.   She has T2DM, COPD, RA, chronic pain, hypothyroidism, MDD, GAD.   Pt went to [facility]. They suspected sepsis with  BCID panel showing staph and MeCA positive. Cultures never grew bacteria. She had MRI brain, CTA, CT of neck/abdomen all negative. She was told to stop smoking and decreased some of her sedating medications and sent home. WBC was good, hgB was good, PCT low.  No no significant lab findings.  She admits she did not decrease her medications. She feels like she is doing fine. She admits she is not taking BREO daily. She is SOB and coughing as she normally does. She has O2 at home she uses intermittently. She continues to smoke daily.   .. Active Ambulatory Problems    Diagnosis Date Noted  . Mixed hyperlipidemia 09/28/2007  . HYPERCALCEMIA 11/13/2009  . MDD (major depressive disorder), recurrent episode, mild ([place]) 09/28/2007  . Chronic pain syndrome 06/05/2007  . FATTY LIVER DISEASE 09/28/2007  . ARTHRITIS, ACROMIOCLAVICULAR 07/31/2008  . SPINAL STENOSIS IN CERVICAL REGION 07/31/2008  . OSTEOPOROSIS 10/17/2007  . Elevated liver function tests 09/28/2007  . Allergic rhinitis 10/06/2010  . Chronic headache 04/03/2011  . Tobacco use 04/03/2011  . Chronic pain 04/03/2011  . Benign hypertension 04/03/2011  . Sacroiliitis ([place]) 09/14/2011  . Diabetes type 2, controlled ([place]) 03/14/2013  . Hypothyroidism 03/14/2013  . Trochanteric bursitis of right hip 05/04/2013  . Anxiety state 05/16/2013  . Ganglion cyst of wrist 07/13/2013  . Acute pain of left shoulder 09/23/2013  . COLD (chronic obstructive lung disease) ([place]) 12/06/2013  . Hyperlipidemia 01/22/2015  . COPD (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 ([place])  03/05/2015  . Myocardial infarct ([place]) 03/09/2017  . CAD (coronary artery disease) 03/09/2017  . Rotator cuff tendonitis, left 03/10/2017  . Anxiety 03/12/2017  . Left upper arm swelling secondary to rotator cuff tear and fluid tracking down biceps tendon sheath 04/14/2017  . Common bile duct dilatation 04/27/2017  . Lumbar degenerative disc disease 05/13/2017  . Abnormal finding on GI tract imaging 05/25/2017  . Chronic back pain 02/27/2016  . Atherosclerosis of native coronary artery of native heart without angina pectoris 03/16/2016  . RA (rheumatoid arthritis) ([place]) 07/06/2016  . Acquired hypothyroidism 12/01/2015  . Moderate episode of recurrent major depressive disorder ([place]) 07/24/2017  . Bilateral calf pain 07/24/2017  . Memory changes 07/24/2017  . Bilateral cataracts 09/07/2017  . Dry eye syndrome of bilateral lacrimal glands 09/07/2017  . Positive colorectal cancer screening using Cologuard test 10/07/2017  . Microalbuminuria 10/07/2017  . Weakness of both lower extremities 10/07/2017  . History of diverticulitis 11/21/2017  . Non-intractable vomiting with nausea 11/21/2017  . Left lower quadrant guarding 11/21/2017  . Generalized abdominal pain 11/21/2017  . Vasovagal syncope 12/16/2017  . Vitamin D deficiency 04/05/2018  . Primary insomnia 04/05/2018  . Hiatal hernia 04/05/2018  . Primary osteoarthritis 07/14/2018  . Gastroesophageal reflux disease with esophagitis 07/14/2018  . Fixed pupil of both eyes 07/18/2018  . Frequent headaches 07/18/2018  . Unsteady gait 07/18/2018  . Fatigue 12/11/2018  . Excessive sleepiness 05/15/2019  . No appetite 05/15/2019  . SIRS (systemic inflammatory response syndrome) ([place]) 06/04/2019  . Sepsis ([place]) 06/04/2019  . Bilateral hand swelling  07/16/2019  . Bilateral hand pain 07/16/2019   Resolved Ambulatory Problems    Diagnosis Date Noted  . DM (diabetes mellitus) (HCC) 06/05/2007  . HEMORRHOIDS, WITH BLEEDING 04/22/2008  .  Acute maxillary sinusitis 04/10/2010  . Alcoholic fatty liver 12/24/2009  . RENAL INSUFFICIENCY, ACUTE 10/23/2008  . ROTATOR CUFF SYNDROME, RIGHT 04/01/2008  . ALLERGIC REACTION 12/24/2009  . ELEVATED BLOOD PRESSURE WITHOUT DIAGNOSIS OF HYPERTENSION 05/13/2010  . CHRONIC OBSTRUCTIVE PULMONARY DISEASE, ACUTE EXACERBATION 08/14/2010  . Recurrent acute sinusitis 09/09/2010  . Allergic dermatitis due to poison ivy 11/27/2010  . DERMATITIS, ATOPIC 01/15/2011  . Arthritis of carpometacarpal joint 02/14/2012  . Ganglion cyst of left foot 07/13/2013  . Chronic pain syndrome 12/05/2013  . Primary osteoarthritis of left wrist 12/10/2014  . Rheumatoid arthritis involving both hands (HCC) 03/09/2017  . Bulging lumbar disc 02/27/2016  . Pyelonephritis 11/21/2017  . Upper abdominal pain 03/10/2018  . Patellar sleeve fracture, right, closed, initial encounter 04/24/2018   Past Medical History:  Diagnosis Date  . Cervicalgia   . COPD (chronic obstructive pulmonary disease) (HCC)   . DDD (degenerative disc disease), lumbar   . Diabetes mellitus   . Fatty liver   . Narcotic abuse (HCC)   . Osteoarthritis   . Osteoporosis   . Rheumatoid arthritis (HCC)   . Thyroid disease      Review of Systems    see HPI.  Objective:   Physical Exam Vitals reviewed.  Constitutional:      Appearance: Normal appearance.  HENT:     Head: Normocephalic.  Cardiovascular:     Rate and Rhythm: Normal rate and regular rhythm.     Pulses: Normal pulses.     Heart sounds: Normal heart sounds.  Pulmonary:     Effort: Pulmonary effort is normal.     Breath sounds: Normal breath sounds.     Comments: Coarse breath sounds.  Rhonchi and wheezing bilateral lungs.  Musculoskeletal:     Cervical back: Normal range of motion.  Neurological:     General: No focal deficit present.     Mental Status: She is alert and oriented to person, place, and time.  Psychiatric:        Mood and Affect: Mood normal.            Assessment & Plan:  Marland KitchenMarland KitchenDiane was seen today for hospitalization follow-up.  Diagnoses and all orders for this visit:  COPD exacerbation (HCC) -     predniSONE (DELTASONE) 20 MG tablet; 3 tablets for 3 days, take 1 tablet for 3 days, take 2 tablets for 3 days, take 1 tablet for 4 days.  Altered mental status, unspecified altered mental status type  COPD (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)  Controlled type 2 diabetes mellitus without complication, without long-term current use of insulin (HCC)  Acquired hypothyroidism  Chronic pain syndrome  Reviewed ED notes for 3/12.   Reassured no sepsis with negative blood cultures despite BCID positive. Her pulse ox is 91 percent today on no O2. Encouraged to use home o2 more. Her lungs sound terrible but CT at hospital confirmed no infection. Treated with burst of prednisone. She is not using BREO daily. Start using Daily. Discussed lung exercises. Discussed changing BREO to Trelegy patient declined at this time. Follow up in 3 months or sooner if needed.   Declined smoking cessation.   Follow up with [facility] and pain clinic who prescribe your medication that there is some concern about over sedating.

## 2019-08-27 ENCOUNTER — Encounter: Payer: Self-pay | Admitting: Physician Assistant

## 2019-09-01 ENCOUNTER — Other Ambulatory Visit: Payer: Self-pay | Admitting: Physician Assistant

## 2019-09-01 DIAGNOSIS — F5101 Primary insomnia: Secondary | ICD-10-CM

## 2019-09-12 ENCOUNTER — Other Ambulatory Visit: Payer: Self-pay

## 2019-09-12 ENCOUNTER — Ambulatory Visit (INDEPENDENT_AMBULATORY_CARE_PROVIDER_SITE_OTHER): Payer: Medicare Other | Admitting: Physician Assistant

## 2019-09-12 VITALS — BP 148/90 | HR 94 | Ht 63.0 in | Wt 131.0 lb

## 2019-09-12 DIAGNOSIS — R413 Other amnesia: Secondary | ICD-10-CM | POA: Diagnosis not present

## 2019-09-12 DIAGNOSIS — Z23 Encounter for immunization: Secondary | ICD-10-CM

## 2019-09-12 DIAGNOSIS — E119 Type 2 diabetes mellitus without complications: Secondary | ICD-10-CM

## 2019-09-12 MED ORDER — AMBULATORY NON FORMULARY MEDICATION: 0 refills | Status: DC

## 2019-09-12 NOTE — Patient Instructions (Signed)

## 2019-09-12 NOTE — Progress Notes (Signed)
Subjective:    Patient ID: [name], female    DOB: 17-Feb-1951, 69 y.o.   MRN: [id number]  HPI Patient is a 69 year old female with CAD, COPD, GERD, rheumatoid arthritis who presents to the clinic with concerns about memory changes.  Overall she feels like she does not remember as well.  She forgets trivia and things that she always used to remember.  She denies any trouble getting places or handling day-to-day affairs.  She denies any agitation or anger issues.  She denies any forgetting of names and day-to-day functions.  Pt is compliant with medications. Not checking sugars. No hypoglycemic events.  No open sores or wounds.  Her breathing is the same.  She still struggles daily with shortness of breath.  She is using her inhalers.  She has oxygen at home if needed.  .. Active Ambulatory Problems    Diagnosis Date Noted  . Mixed hyperlipidemia 09/28/2007  . HYPERCALCEMIA 11/13/2009  . MDD (major depressive disorder), recurrent episode, mild (HCC) 09/28/2007  . Chronic pain syndrome 06/05/2007  . FATTY LIVER DISEASE 09/28/2007  . ARTHRITIS, ACROMIOCLAVICULAR 07/31/2008  . SPINAL STENOSIS IN CERVICAL REGION 07/31/2008  . OSTEOPOROSIS 10/17/2007  . Elevated liver function tests 09/28/2007  . Allergic rhinitis 10/06/2010  . Chronic headache 04/03/2011  . Tobacco use 04/03/2011  . Chronic pain 04/03/2011  . Benign hypertension 04/03/2011  . Sacroiliitis (HCC) 09/14/2011  . Diabetes type 2, controlled (HCC) 03/14/2013  . Hypothyroidism 03/14/2013  . Trochanteric bursitis of right hip 05/04/2013  . Anxiety state 05/16/2013  . Ganglion cyst of wrist 07/13/2013  . Acute pain of left shoulder 09/23/2013  . COLD (chronic obstructive lung disease) (HCC) 12/06/2013  . Hyperlipidemia 01/22/2015  . 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 Myocardial infarct (HCC) 03/09/2017  . CAD (coronary artery disease) 03/09/2017  . Rotator cuff  tendonitis, left 03/10/2017  . Anxiety 03/12/2017  . Left upper arm swelling secondary to rotator cuff tear and fluid tracking down biceps tendon sheath 04/14/2017  . Common bile duct dilatation 04/27/2017  . Lumbar degenerative disc disease 05/13/2017  . Abnormal finding on GI tract imaging 05/25/2017  . Chronic back pain 02/27/2016  . Atherosclerosis of native coronary artery of native heart without angina pectoris 03/16/2016  . RA (rheumatoid arthritis) (HCC) 07/06/2016  . Acquired hypothyroidism 12/01/2015  . Moderate episode of recurrent major depressive disorder (HCC) 07/24/2017  . Bilateral calf pain 07/24/2017  . Memory changes 07/24/2017  . Bilateral cataracts 09/07/2017  . Dry eye syndrome of bilateral lacrimal glands 09/07/2017  . Positive colorectal cancer screening using Cologuard test 10/07/2017  . Microalbuminuria 10/07/2017  . Weakness of both lower extremities 10/07/2017  . History of diverticulitis 11/21/2017  . Non-intractable vomiting with nausea 11/21/2017  . Left lower quadrant guarding 11/21/2017  . Generalized abdominal pain 11/21/2017  . Vasovagal syncope 12/16/2017  . Vitamin D deficiency 04/05/2018  . Primary insomnia 04/05/2018  . Hiatal hernia 04/05/2018  . Primary osteoarthritis 07/14/2018  . Gastroesophageal reflux disease with esophagitis 07/14/2018  . Fixed pupil of both eyes 07/18/2018  . Frequent headaches 07/18/2018  . Unsteady gait 07/18/2018  . Fatigue 12/11/2018  . Excessive sleepiness 05/15/2019  . No appetite 05/15/2019  . SIRS (systemic inflammatory response syndrome) (HCC) 06/04/2019  . Sepsis (HCC) 06/04/2019  . Bilateral hand swelling 07/16/2019  . Bilateral hand pain 07/16/2019   Resolved Ambulatory Problems    Diagnosis Date Noted  . DM (diabetes mellitus) (HCC) 06/05/2007  .  HEMORRHOIDS, WITH BLEEDING 04/22/2008  . Acute maxillary sinusitis 04/10/2010  . Alcoholic fatty liver 12/24/2009  . RENAL INSUFFICIENCY, ACUTE  10/23/2008  . ROTATOR CUFF SYNDROME, RIGHT 04/01/2008  . ALLERGIC REACTION 12/24/2009  . ELEVATED BLOOD PRESSURE WITHOUT DIAGNOSIS OF HYPERTENSION 05/13/2010  . CHRONIC OBSTRUCTIVE PULMONARY DISEASE, ACUTE EXACERBATION 08/14/2010  . Recurrent acute sinusitis 09/09/2010  . Allergic dermatitis due to poison ivy 11/27/2010  . DERMATITIS, ATOPIC 01/15/2011  . Arthritis of carpometacarpal joint 02/14/2012  . Ganglion cyst of left foot 07/13/2013  . Chronic pain syndrome 12/05/2013  . Primary osteoarthritis of left wrist 12/10/2014  . Rheumatoid arthritis involving both hands (HCC) 03/09/2017  . Bulging lumbar disc 02/27/2016  . Pyelonephritis 11/21/2017  . Upper abdominal pain 03/10/2018  . Patellar sleeve fracture, right, closed, initial encounter 04/24/2018   Past Medical History:  Diagnosis Date  . Cervicalgia   . COPD (chronic obstructive pulmonary disease) (HCC)   . DDD (degenerative disc disease), lumbar   . Diabetes mellitus   . Fatty liver   . Narcotic abuse (HCC)   . Osteoarthritis   . Osteoporosis   . Rheumatoid arthritis (HCC)   . Thyroid disease     Review of Systems See HPI>     Objective:   Physical Exam Vitals reviewed.  Constitutional:      Appearance: Normal appearance.  HENT:     Head: Normocephalic and atraumatic.  Neck:     Vascular: No carotid bruit.  Cardiovascular:     Rate and Rhythm: Normal rate and regular rhythm.     Pulses: Normal pulses.  Pulmonary:     Breath sounds: Wheezing and rhonchi present.     Comments: Coarse breath sounds.  Neurological:     General: No focal deficit present.     Mental Status: She is alert and oriented to person, place, and time.  Psychiatric:        Mood and Affect: Mood normal.           Assessment & Plan:  Marland KitchenMarland KitchenDiane was seen today for memory loss.  Diagnoses and all orders for this visit:  Memory changes -     RPR -     Vitamin B12 -     Sedimentation rate -     CBC -     TSH -      Folate  Need for shingles vaccine -     AMBULATORY NON FORMULARY MEDICATION; Shingles vaccine x 2 doses Z23  Controlled type 2 diabetes mellitus without complication, without long-term current use of insulin (HCC) -     Hemoglobin A1c   .[name] Lab Results  Component Value Date   HGBA1C 6.2 (A) 03/14/2019   A1C is stable.  Stay on same dose.  On statin.  On ACE. BP not to goal.  UTD eye, foot exam.  Pneumonia and flu UTD.   .. MMSE - Mini Mental State Exam 09/12/2019 07/14/2018  Orientation to time 4 5  Orientation to Place 5 5  Registration 3 3  Attention/ Calculation 4 3  Recall 3 3  Language- name 2 objects 2 2  Language- repeat 1 1  Language- follow 3 step command 3 3  Language- read & follow direction 1 1  Write a sentence 1 1  Copy design 1 1  Total score 28 28   MMSE today was great. MRI done last March with me and recently in ED about 1 month ago within normal limits.  Discussed medication she is on  2-3 medications that can cause sedation and memory changes. expectations of some memory changes with age discussed. HO given to discuss how to improve. AVOID all alcohol. Memory labs panel ordered.   Pt was supposed to give urine sample on way out but inadvertently forgot. No urinary symptoms but wanted to confirm with memory changes and hx.   COPD- lungs do not sound well. Pulse ox is stable at 94 percent. Pt agrees to cut back smoking. Perhaps oxygen destat could cause some memory issues.

## 2019-09-14 ENCOUNTER — Encounter: Payer: Self-pay | Admitting: Physician Assistant

## 2019-09-14 ENCOUNTER — Encounter: Payer: Self-pay | Admitting: Nurse Practitioner

## 2019-09-14 ENCOUNTER — Telehealth: Payer: Self-pay

## 2019-09-14 ENCOUNTER — Telehealth (INDEPENDENT_AMBULATORY_CARE_PROVIDER_SITE_OTHER): Payer: Medicare Other | Admitting: Nurse Practitioner

## 2019-09-14 VITALS — Temp 98.6°F

## 2019-09-14 DIAGNOSIS — R519 Headache, unspecified: Secondary | ICD-10-CM | POA: Diagnosis not present

## 2019-09-14 DIAGNOSIS — R5383 Other fatigue: Secondary | ICD-10-CM

## 2019-09-14 NOTE — Patient Instructions (Signed)
The following information is provided as a general resource for ADULT patients only and does NOT take into account PREGNANCY, ALLERGIES, LIVER CONDITIONS, KIDNEY CONDITIONS, GASTROINTESTINAL CONDITIONS, OR PRESCRIPTION MEDICATION INTERACTIONS. Please be sure to ask your provider if the following are safe to take with your specific medical history, conditions, or current medication regimen if you are unsure.   Adult Basic Symptom Management for Sinusitis  Congestion: Guaifenesin (Mucinex)- follow directions on packaging with a maximum dose of 2400mg in a 24 hour period.  Pain/Fever: Ibuprofen 200mg - 400mg every 4-6 hours as needed (MAX 1200mg in a 24 hour period) Pain/Fever: Tylenol 500mg -1000mg every 6-8 hours as needed (MAX 3000mg in a 24 hour period)  Cough: Dextromethorphan (Delsym)- follow directions on packing with a maximum dose of 120mg in a 24 hour period.  Nasal Stuffiness: Saline nasal spray and/or Nettie Pot with sterile saline solution  Runny Nose: Fluticasone nasal spray (Flonase) OR Mometasone nasal spray (Nasonex) OR Triamcinolone Acetonide nasal spray (Nasacort)- follow directions on the packaging  Pain/Pressure: Warm washcloth to the face  Sore Throat: Warm salt water gargles  If you have allergies, you may also consider taking an oral antihistamine (like Zyrtec or Claritin) as these may also help with your symptoms.  **Many medications will have more than one ingredient, be sure you are reading the packaging carefully and not taking more than one dose of the same kind of medication at the same time or too close together. It is OK to use formulas that have all of the ingredients you want, but do not take them in a combined medication and as separate dose too close together. If you have any questions, the pharmacist will be happy to help you decide what is safe.    

## 2019-09-14 NOTE — Telephone Encounter (Signed)
[name] called to let [name] know that her temperature has gone up to 99.5. I advised to make sure she increases her fluid intake and to get plenty of rest.

## 2019-09-14 NOTE — Progress Notes (Signed)
Virtual Visit via MyChart Note  I connected with  [name] on 09/14/19 at 10:30 AM EDT by the video enabled telemedicine application, MyChart, and verified that I am speaking with the correct person using two identifiers.   I introduced myself as a Publishing rights manager with the practice. We discussed the limitations of evaluation and management by telemedicine and the availability of in person appointments. The patient expressed understanding and agreed to proceed.  VISIT CONVERTED TO PHONE VISIT DUE TO TECHNICAL DIFFICULTIES  The patient is: at home I am: in the office  Subjective:    CC: Fatigue for the last 2 days.  HPI: [name] is a 69 y.o. y/o female presenting via MyChart today for fatigue and low-grade fever that started approximately 2 days ago.  She also reports a sinus headache, pain and pressure in her eyes, and back aches.  She denies pain or pressure in her ears, cough, sore throat, congestion, rhinorrhea, shortness of breath, wheezing, or known Covid exposure.  She reports she is actually feeling a little better today and believes that her symptoms are a result of possible sinus infection.  Past medical history, Surgical history, Family history not pertinant except as noted below, Social history, Allergies, and medications have been entered into the medical record, reviewed, and corrections made.   Review of Systems:  See HPI for pertinent positives and negatives.  Objective:    General: Speaking clearly in complete sentences without any shortness of breath.  Alert and oriented x3.  Normal judgment. No apparent acute distress.   Impression and Recommendations:    1. Sinus headache Symptoms and presentation consistent with sinus headache.  Discussed over-the-counter conservative measures that the patient may be utilized to help with symptoms including ibuprofen or Tylenol for pain, fluticasone or mometasone nasal spray, and the use of oral antihistamines in  addition to rest and increased fluid intake. Patient instructed to contact the office if symptoms persist beyond 7 days or if symptoms improve and then return again.  At that point will consider antibiotic therapy based on assessment.  2. Other fatigue Fatigue most likely associated with allergies/sinus exacerbation.  Discussed the importance of plenty of rest and increased fluid intake. If symptoms persist or fail to improve patient encouraged to contact the office.   I discussed the assessment and treatment plan with the patient. The patient was provided an opportunity to ask questions and all were answered. The patient agreed with the plan and demonstrated an understanding of the instructions.   The patient was advised to call back or seek an in-person evaluation if the symptoms worsen or if the condition fails to improve as anticipated.  I provided 5 minutes of non-face-to-face interaction with this MYCHART visit.   Tollie Eth, NP

## 2019-09-16 ENCOUNTER — Other Ambulatory Visit: Payer: Self-pay | Admitting: Physician Assistant

## 2019-09-16 DIAGNOSIS — E039 Hypothyroidism, unspecified: Secondary | ICD-10-CM

## 2019-09-18 ENCOUNTER — Other Ambulatory Visit: Payer: Self-pay | Admitting: Physician Assistant

## 2019-09-20 ENCOUNTER — Other Ambulatory Visit: Payer: Self-pay | Admitting: Neurology

## 2019-09-20 DIAGNOSIS — F419 Anxiety disorder, unspecified: Secondary | ICD-10-CM

## 2019-09-20 NOTE — Telephone Encounter (Signed)
Patient left vm stating she has lost her Xanax and can't find it anywhere. Asking for refill. Does look like the last time this was filled was 03/30/2019 #30 with 5 refills. Does look like time to refill. Please advise.

## 2019-09-20 NOTE — Telephone Encounter (Signed)
Ok to send my controlled substance phone system is not working right. Give 2 refills.

## 2019-09-21 MED ORDER — ALPRAZOLAM 1 MG PO TABS: 1.0000 mg | ORAL_TABLET | Freq: Every evening | ORAL | 2 refills | Status: DC | PRN

## 2019-10-08 ENCOUNTER — Ambulatory Visit (INDEPENDENT_AMBULATORY_CARE_PROVIDER_SITE_OTHER): Payer: Medicare Other | Admitting: Nurse Practitioner

## 2019-10-08 ENCOUNTER — Other Ambulatory Visit: Payer: Self-pay

## 2019-10-08 ENCOUNTER — Encounter: Payer: Self-pay | Admitting: Nurse Practitioner

## 2019-10-08 VITALS — BP 176/89 | HR 96 | Ht 63.0 in | Wt 128.0 lb

## 2019-10-08 DIAGNOSIS — B070 Plantar wart: Secondary | ICD-10-CM | POA: Diagnosis not present

## 2019-10-08 NOTE — Progress Notes (Signed)
Acute Office Visit  Subjective:    Patient ID: [name], female    DOB: Apr 11, 1951, 69 y.o.   MRN: [id number]  Chief Complaint  Patient presents with  . Leg Pain    HPI Patient is in today for left leg pain that has been present for about the past 5 days. She reports gradual onset without known injury, fall, or overuse. Her pain runs on the lateral portion of the calf up through the thigh. She does report a small plantar wart on the heel of her left foot that has caused her to walk differently that she feels may be contributing to the leg pain.   She denies swelling, redness, or warmth to the area. She denies increased pain with dorsiflexion or plantar flexion. She denies difficulty with walk, standing, or sitting.   Past Medical History:  Diagnosis Date  . CAD (coronary artery disease)   . Cervicalgia   . Chronic pain   . COPD (chronic obstructive pulmonary disease) ([place])   . DDD (degenerative disc disease), lumbar   . Diabetes mellitus   . Fatty liver   . Hyperlipidemia   . Narcotic abuse ([place])    history  . Osteoarthritis   . Osteoporosis   . Rheumatoid arthritis ([place])   . Thyroid disease    hypothyroid    Past Surgical History:  Procedure Laterality Date  . BREAST BIOPSY    . BREAST EXCISIONAL BIOPSY    . BREAST SURGERY     braest lump/ benign  . CHOLECYSTECTOMY    . LUMBAR LAMINECTOMY  05/2010   Dr. Rushie Nyhan  . TOTAL KNEE ARTHROPLASTY  2010   right    Family History  Problem Relation Age of Onset  . Heart disease Mother 33       MI  . Hyperlipidemia Mother   . Rheum arthritis Mother   . Lymphoma Mother   . Heart disease Father 26       MI  . Diabetes Father   . Rheum arthritis Father   . Cancer Maternal Grandmother        hodgkins lympoma  . Cirrhosis Sister        primary biliary    Social History   Socioeconomic History  . Marital status: Married    Spouse name: Not on file  . Number of children: 2  . Years of education: Not on file  .  Highest education level: Not on file  Occupational History  . Not on file  Tobacco Use  . Smoking status: Current Every Day Smoker    Packs/day: 1.00    Years: 43.00    Pack years: 43.00    Types: Cigarettes  . Smokeless tobacco: Never Used  . Tobacco comment: Has tried Chantix and Wellbutrin in the past  Substance and Sexual Activity  . Alcohol use: Yes    Alcohol/week: 5.0 standard drinks    Types: 5 Standard drinks or equivalent per week    Comment: 1 beer per day  . Drug use: No  . Sexual activity: Not on file  Other Topics Concern  . Not on file  Social History Narrative  . Not on file   Social Determinants of Health   Financial Resource Strain:   . Difficulty of Paying Living Expenses:   Food Insecurity:   . Worried About Charity fundraiser in the Last Year:   . Arboriculturist in the Last Year:   Transportation Needs:   .  Lack of Transportation (Medical):   [name] Lack of Transportation (Non-Medical):   Physical Activity:   . Days of Exercise per Week:   . Minutes of Exercise per Session:   Stress:   . Feeling of Stress :   Social Connections:   . Frequency of Communication with Friends and Family:   . Frequency of Social Gatherings with Friends and Family:   . Attends Religious Services:   . Active Member of Clubs or Organizations:   . Attends Banker Meetings:   [name] Marital Status:   Intimate Partner Violence:   . Fear of Current or Ex-Partner:   . Emotionally Abused:   [name] Physically Abused:   . Sexually Abused:     Outpatient Medications Prior to Visit  Medication Sig Dispense Refill  . alendronate (FOSAMAX) 70 MG tablet TAKE ONE TABLET EVERY 7 DAYS WITH A FULL GLASS OF WATER ON AN EMPTY STOMACH 4 tablet 11  . ALPRAZolam (XANAX) 1 MG tablet Take 1 tablet (1 mg total) by mouth at bedtime as needed. 30 tablet 2  . AMBULATORY NON FORMULARY MEDICATION Shingles vaccine x 2 doses Z23 2 Doses/Fill 0  . aspirin EC 81 MG tablet Take 81 mg by mouth  daily.    [name] atorvastatin (LIPITOR) 80 MG tablet TAKE ONE TABLET BY MOUTH EVERY DAY 90 tablet 2  . Buprenorphine HCl-Naloxone HCl 8-2 MG FILM PLACE TWO FILMS UNDER THE TONGUE EVERY DAY    . buPROPion (WELLBUTRIN XL) 150 MG 24 hr tablet TAKE 1 TABLET BY MOUTH EVERY DAY IN THE MORNING 90 tablet 3  . cetirizine (ZYRTEC) 10 MG tablet Take 10 mg by mouth daily.    . Cholecalciferol (VITAMIN D3) 1000 units CAPS Take by mouth.    . desvenlafaxine (PRISTIQ) 100 MG 24 hr tablet Take 1 tablet (100 mg total) by mouth daily. 90 tablet 3  . diclofenac sodium (VOLTAREN) 1 % GEL Apply 4 g topically 4 (four) times daily. To affected joint. 100 g 1  . famotidine (PEPCID) 20 MG tablet Take 1 tablet (20 mg total) by mouth 2 (two) times daily. 60 tablet 5  . fluticasone furoate-vilanterol (BREO ELLIPTA) 100-25 MCG/INH AEPB Inhale 1 puff into the lungs daily. 1 each 11  . gabapentin (NEURONTIN) 600 MG tablet TAKE 1 TABLET BY MOUTH THREE TIMES A DAY 270 tablet 0  . hydroxychloroquine (PLAQUENIL) 200 MG tablet TK 1 T PO D    . levothyroxine (SYNTHROID) 150 MCG tablet Take 1 tablet (150 mcg total) by mouth daily. Needs labs 30 tablet 0  . lisinopril (PRINIVIL,ZESTRIL) 2.5 MG tablet Take 1 tablet (2.5 mg total) by mouth daily. 90 tablet 1  . meclizine (ANTIVERT) 25 MG tablet Take 1 tablet (25 mg total) by mouth 3 (three) times daily as needed for dizziness. 30 tablet 0  . metFORMIN (GLUCOPHAGE) 500 MG tablet TAKE 1 TABLET(500 MG) BY MOUTH TWICE DAILY WITH A MEAL 180 tablet 3  . methotrexate (RHEUMATREX) 15 MG tablet Take 1 tablet (15 mg total) by mouth once a week. Caution: Chemotherapy. Protect from light. 4 tablet 1  . promethazine (PHENERGAN) 25 MG tablet Take 1 tablet (25 mg total) by mouth every 8 (eight) hours as needed for nausea or vomiting. 20 tablet 0  . traZODone (DESYREL) 50 MG tablet Take 1 tablet (50 mg total) by mouth at bedtime. 30 tablet 1  . Vitamin D, Ergocalciferol, (DRISDOL) 1.25 MG (50000 UT) CAPS  capsule TAKE 1 CAPSULE (50,000 UNITS TOTAL) BY MOUTH  EVERY 7 (SEVEN) DAYS. 12 capsule 0  . nitroGLYCERIN (NITROSTAT) 0.4 MG SL tablet Place 1 tablet (0.4 mg total) under the tongue every 5 (five) minutes as needed for chest pain. (Patient not taking: Reported on 10/08/2019) 10 tablet 0  . traZODone (DESYREL) 150 MG tablet Take 1 tablet (150 mg total) by mouth at bedtime. 90 tablet 4   No facility-administered medications prior to visit.    Allergies  Allergen Reactions  . Codeine Palpitations and Other (See Comments)  . Naproxen Rash and Palpitations    GI upset  . Budesonide-Formoterol Fumarate Rash    REACTION: rash REACTION: rash REACTION: rash  . Duloxetine Hcl Other (See Comments)    No difference for pain or anti-depressant.  Other reaction(s): Other No difference for pain or anti-depressant. No difference for pain or anti-depressant.  . Fentanyl Other (See Comments)    REACTION: severe aggitation, mood changes Other reaction(s): Hallucination, Psychiatric Hallucinations Hallucinations REACTION: severe aggitation, mood changes   . Tramadol-Acetaminophen     Other reaction(s): Unknown  . Effexor Xr [Venlafaxine Hcl Er]     Felt more anxious or no difference.   . Fluticasone-Salmeterol   . Lisinopril Rash    Maybe we dont really know    Review of Systems  Constitutional: Negative for activity change, chills, diaphoresis and fatigue.  Musculoskeletal: Positive for arthralgias and myalgias. Negative for back pain, gait problem and joint swelling.  Skin: Positive for wound. Negative for color change.  Neurological: Negative for weakness.  Hematological: Negative for adenopathy. Does not bruise/bleed easily.  Psychiatric/Behavioral: Negative for sleep disturbance.       Objective:    Physical Exam Vitals and nursing note reviewed.  Constitutional:      Appearance: Normal appearance.  HENT:     Head: Normocephalic.  Eyes:     Extraocular Movements: Extraocular  movements intact.     Pupils: Pupils are equal, round, and reactive to light.  Cardiovascular:     Rate and Rhythm: Normal rate.     Pulses: Normal pulses.  Pulmonary:     Effort: Pulmonary effort is normal.  Musculoskeletal:        General: No swelling, tenderness, deformity or signs of injury.     Right lower leg: No edema.     Left lower leg: No edema.       Legs:       Feet:  Skin:    General: Skin is warm and dry.     Capillary Refill: Capillary refill takes less than 2 seconds.     Findings: Lesion present.  Neurological:     General: No focal deficit present.     Mental Status: She is alert and oriented to person, place, and time.     Sensory: No sensory deficit.     Coordination: Coordination normal.     Gait: Gait normal.     Deep Tendon Reflexes: Reflexes normal.  Psychiatric:        Mood and Affect: Mood normal.        Behavior: Behavior normal.        Thought Content: Thought content normal.        Judgment: Judgment normal.     BP (!) 176/89   Pulse 96   Ht 5\' 3"  (1.6 m)   Wt 128 lb (58.1 kg)   SpO2 92%   BMI 22.67 kg/m  Wt Readings from Last 3 Encounters:  10/08/19 128 lb (58.1 kg)  09/12/19 131 lb (59.4 kg)  08/24/19 128 lb (58.1 kg)    Health Maintenance Due  Topic Date Due  . OPHTHALMOLOGY EXAM  09/03/2018  . HEMOGLOBIN A1C  09/12/2019    There are no preventive care reminders to display for this patient.   Lab Results  Component Value Date   TSH 55.83 (H) 07/17/2019   Lab Results  Component Value Date   WBC 10.8 07/17/2019   HGB 14.2 07/17/2019   HCT 41.8 07/17/2019   MCV 91.3 07/17/2019   PLT 329 07/17/2019   Lab Results  Component Value Date   NA 136 07/17/2019   K 3.7 07/17/2019   CO2 28 07/17/2019   GLUCOSE 175 (H) 07/17/2019   BUN 16 07/17/2019   CREATININE 0.74 07/17/2019   BILITOT 0.3 07/17/2019   ALKPHOS 193 (H) 12/10/2014   AST 10 07/17/2019   ALT 8 07/17/2019   PROT 6.8 07/17/2019   ALBUMIN 4.1 12/10/2014    CALCIUM 9.9 07/17/2019   Lab Results  Component Value Date   CHOL 186 12/13/2018   Lab Results  Component Value Date   HDL 83 12/13/2018   Lab Results  Component Value Date   LDLCALC 79 12/13/2018   Lab Results  Component Value Date   TRIG 138 12/13/2018   Lab Results  Component Value Date   CHOLHDL 2.2 12/13/2018   Lab Results  Component Value Date   HGBA1C 6.2 (A) 03/14/2019       Assessment & Plan:   1. Plantar wart of left foot 0.5 cm plantar wart present on the heel of the left foot. Cryotherapy with liquid nitrogen performed x3 and bandaid placed over area. Leg pain likely due to muscle tenderness associated with altered gait due to pain from the plantar wart.  Patient instructed to keep the area clean and use cushioning in the heel of her shoe to reduce pressure on the area.  If symptoms persist or worsen, return to clinic for further evaluation.   Return if symptoms worsen or fail to improve.    Tollie Eth, NP

## 2019-10-08 NOTE — Patient Instructions (Signed)
Plantar Warts Plantar warts are small growths on the bottom of the foot (sole). Warts are caused by a type of germ (virus). Most warts are not painful, and they usually do not cause problems. Sometimes, plantar warts can cause pain when you walk. Warts often go away on their own in time. They can also spread to other areas of the body. Treatments may be done if needed. What are the causes?  Plantar warts are caused by a germ that is called human papillomavirus (HPV). ? Walking barefoot can cause exposure to the germ, especially if your feet are wet. ? Warts happen when HPV attacks a break in the skin of the foot. What increases the risk?  Being between 10-20 years of age.  Using public showers or locker rooms.  Having a weakened body defense system (immune system). What are the signs or symptoms?   Flat or slightly raised growths that have a rough surface and look like a callus.  Pain when you use your foot to support your body weight. How is this treated? In many cases, warts do not need treatment. Without treatment, they often go away with time. If treatment is needed or wanted, options may include:  Applying medicated solutions, creams, or patches to the wart. These make the skin soft so that layers will slowly shed away.  Freezing the wart with liquid nitrogen (cryotherapy).  Burning the wart with: ? Laser treatment. ? An electrified probe (electrocautery).  Injecting a medicine (Candida antigen) into the wart to help the body's defense system fight off the wart.  Having surgery to remove the wart.  Putting duct tape over the top of the wart (occlusion). You will leave the tape in place for as long as told by your doctor. Then you will replace it with a new strip of tape. This is done until the wart goes away. Repeat treatment may be needed if you choose to remove warts. Warts sometimes go away and come back again. Follow these instructions at home: General  instructions  Apply creams or solutions only as told by your doctor. Follow these steps if your doctor tells you to do so: ? Soak your foot in warm water. ? Remove the top layer of softened skin before you apply the medicine. You can use a pumice stone to remove the skin. ? After you apply the medicine, put a bandage over the area of the wart. ? Repeat the process every day or as told by your doctor.  Do not scratch or pick at a wart.  Wash your hands after you touch a wart.  If a wart hurts, try covering it with a bandage that has a hole in the middle.  Keep all follow-up visits as told by your doctor. This is important. How is this prevented?   Wear shoes and socks. Change your socks every day.  Keep your feet clean and dry.  Check your feet often.  Do not walk barefoot in: ? Shared locker rooms. ? Shower areas. ? Swimming pools.  Avoid direct contact with warts on other people. Contact a doctor if:  Your warts do not improve after treatment.  You have redness, swelling, or pain at the site of a wart.  You have bleeding from a wart, and the bleeding does not stop when you put light pressure on the wart.  You have diabetes and you get a wart. Summary  Warts are small growths on the skin.  When warts happen on the bottom of   the foot (sole), they are called plantar warts.  In many cases, warts do not need treatment.  Apply creams or solutions only as told by your doctor.  Do not scratch or pick at a wart. Wash your hands after you touch a wart. This information is not intended to replace advice given to you by your health care provider. Make sure you discuss any questions you have with your health care provider. Document Revised: 02/23/2018 Document Reviewed: 02/23/2018 Elsevier Patient Education  2020 [employer]  

## 2019-10-11 ENCOUNTER — Other Ambulatory Visit: Payer: Self-pay | Admitting: Physician Assistant

## 2019-10-12 ENCOUNTER — Other Ambulatory Visit: Payer: Self-pay | Admitting: Physician Assistant

## 2019-10-12 DIAGNOSIS — E039 Hypothyroidism, unspecified: Secondary | ICD-10-CM

## 2019-10-14 NOTE — Progress Notes (Signed)
Subjective:    CC: left leg pain follow up  HPI: Pleasant 69 year old female presenting for left leg pain follow up. Pain originally started along the left shin. Reports her leg pain has not improved since last week and has noted it worsened. Now pain extends from lower leg up to left buttock. Denies numbness and tingling. No known injury and no popping noted. Denies swelling. Has not tried heat/ice. Walking makes it worse and rest makes it better.  I reviewed the past medical history, family history, social history, surgical history, and allergies today and no changes were needed.  Please see the problem list section below in epic for further details.  Past Medical History: Past Medical History:  Diagnosis Date  . CAD (coronary artery disease)   . Cervicalgia   . Chronic pain   . COPD (chronic obstructive pulmonary disease) (HCC)   . DDD (degenerative disc disease), lumbar   . Diabetes mellitus   . Fatty liver   . Hyperlipidemia   . Narcotic abuse (HCC)    history  . Osteoarthritis   . Osteoporosis   . Rheumatoid arthritis (HCC)   . Thyroid disease    hypothyroid   Past Surgical History: Past Surgical History:  Procedure Laterality Date  . BREAST BIOPSY    . BREAST EXCISIONAL BIOPSY    . BREAST SURGERY     braest lump/ benign  . CHOLECYSTECTOMY    . LUMBAR LAMINECTOMY  05/2010   Dr. Netta Corrigan  . TOTAL KNEE ARTHROPLASTY  2010   right   Social History: Social History   Socioeconomic History  . Marital status: Married    Spouse name: Not on file  . Number of children: 2  . Years of education: Not on file  . Highest education level: Not on file  Occupational History  . Not on file  Tobacco Use  . Smoking status: Current Every Day Smoker    Packs/day: 1.00    Years: 43.00    Pack years: 43.00    Types: Cigarettes  . Smokeless tobacco: Never Used  . Tobacco comment: Has tried Chantix and Wellbutrin in the past  Substance and Sexual Activity  . Alcohol use: Yes     Alcohol/week: 5.0 standard drinks    Types: 5 Standard drinks or equivalent per week    Comment: 1 beer per day  . Drug use: No  . Sexual activity: Not on file  Other Topics Concern  . Not on file  Social History Narrative  . Not on file   Social Determinants of Health   Financial Resource Strain:   . Difficulty of Paying Living Expenses:   Food Insecurity:   . Worried About Programme researcher, broadcasting/film/video in the Last Year:   . Barista in the Last Year:   Transportation Needs:   . Freight forwarder (Medical):   [name] Lack of Transportation (Non-Medical):   Physical Activity:   . Days of Exercise per Week:   . Minutes of Exercise per Session:   Stress:   . Feeling of Stress :   Social Connections:   . Frequency of Communication with Friends and Family:   . Frequency of Social Gatherings with Friends and Family:   . Attends Religious Services:   . Active Member of Clubs or Organizations:   . Attends Banker Meetings:   [name] Marital Status:    Family History: Family History  Problem Relation Age of Onset  . Heart disease  Mother 78       MI  . Hyperlipidemia Mother   . Rheum arthritis Mother   . Lymphoma Mother   . Heart disease Father 32       MI  . Diabetes Father   . Rheum arthritis Father   . Cancer Maternal Grandmother        hodgkins lympoma  . Cirrhosis Sister        primary biliary   Allergies: Allergies  Allergen Reactions  . Codeine Palpitations and Other (See Comments)  . Naproxen Rash and Palpitations    GI upset  . Budesonide-Formoterol Fumarate Rash    REACTION: rash REACTION: rash REACTION: rash  . Duloxetine Hcl Other (See Comments)    No difference for pain or anti-depressant.  Other reaction(s): Other No difference for pain or anti-depressant. No difference for pain or anti-depressant.  . Fentanyl Other (See Comments)    REACTION: severe aggitation, mood changes Other reaction(s): Hallucination,  Psychiatric Hallucinations Hallucinations REACTION: severe aggitation, mood changes   . Tramadol-Acetaminophen     Other reaction(s): Unknown  . Effexor Xr [Venlafaxine Hcl Er]     Felt more anxious or no difference.   . Fluticasone-Salmeterol   . Lisinopril Rash    Maybe we dont really know   Medications: See med rec.  Review of Systems: See HPI for pertinent positives and negatives.   Objective:    General: Well Developed, well nourished, and in no acute distress.  Neuro: Alert and oriented x3.  HEENT: Normocephalic, atraumatic.  Skin: Warm and dry. Cardiac: Regular rate and rhythm, no murmurs rubs or gallops, no lower extremity edema.  Respiratory: Clear to auscultation bilaterally. Not using accessory muscles, speaking in full sentences. MSK: Right leg muscle strength 5/5. Left leg muscle strength 4/5, limited by pain. Nontender to palpation, no edema or erythema.    Impression and Recommendations:    1. Left leg pain Unclear etiology. Given involvement up to the buttock and progression of symptoms, possible lumbar spine involvement. Obtaining lumbar spine x-rays. In office administration of Ketorolac 30mg  IM. Prednisone 50mg  burst x 5 days. Referral to physical therapy. - ketorolac (TORADOL) 30 MG/ML injection 30 mg - predniSONE (DELTASONE) 50 MG tablet; Take 1 tablet (50 mg total) by mouth daily.  Dispense: 5 tablet; Refill: 0 - Ambulatory referral to Physical Therapy - DG Lumbar Spine Complete; Future  Return if symptoms worsen or fail to improve. Advised to return for further evaluation with Dr. Darene Lamer if no improvement in symptoms after completing Prednisone burst and physical therapy. ___________________________________________ Clearnce Sorrel, DNP, APRN, FNP-BC [facility] [place]

## 2019-10-15 ENCOUNTER — Encounter: Payer: Self-pay | Admitting: Medical-Surgical

## 2019-10-15 ENCOUNTER — Ambulatory Visit (INDEPENDENT_AMBULATORY_CARE_PROVIDER_SITE_OTHER): Payer: Medicare Other | Admitting: Medical-Surgical

## 2019-10-15 VITALS — BP 155/86 | HR 87 | Temp 98.0°F | Ht 63.0 in | Wt 131.0 lb

## 2019-10-15 DIAGNOSIS — M79605 Pain in left leg: Secondary | ICD-10-CM | POA: Diagnosis not present

## 2019-10-15 MED ORDER — KETOROLAC TROMETHAMINE 30 MG/ML IJ SOLN
30.0000 mg | Freq: Once | INTRAMUSCULAR | Status: AC
  Administered 2019-10-15: 30 mg via INTRAMUSCULAR

## 2019-10-15 MED ORDER — PREDNISONE 50 MG PO TABS: 50.0000 mg | ORAL_TABLET | Freq: Every day | ORAL | 0 refills | Status: DC

## 2019-10-17 ENCOUNTER — Other Ambulatory Visit: Payer: Self-pay

## 2019-10-17 ENCOUNTER — Ambulatory Visit (INDEPENDENT_AMBULATORY_CARE_PROVIDER_SITE_OTHER): Payer: Medicare Other

## 2019-10-17 DIAGNOSIS — M545 Low back pain: Secondary | ICD-10-CM | POA: Diagnosis not present

## 2019-10-17 DIAGNOSIS — M79605 Pain in left leg: Secondary | ICD-10-CM | POA: Diagnosis not present

## 2019-10-18 NOTE — Progress Notes (Signed)
X-ray lumbar spine shows multilevel arthritis.  No acute fractures.

## 2019-10-20 ENCOUNTER — Other Ambulatory Visit: Payer: Self-pay | Admitting: Physician Assistant

## 2019-10-20 DIAGNOSIS — M5136 Other intervertebral disc degeneration, lumbar region: Secondary | ICD-10-CM

## 2019-10-22 ENCOUNTER — Encounter: Payer: Self-pay | Admitting: Family Medicine

## 2019-10-22 ENCOUNTER — Ambulatory Visit (INDEPENDENT_AMBULATORY_CARE_PROVIDER_SITE_OTHER): Payer: Medicare Other | Admitting: Family Medicine

## 2019-10-22 DIAGNOSIS — M25569 Pain in unspecified knee: Secondary | ICD-10-CM | POA: Insufficient documentation

## 2019-10-22 DIAGNOSIS — M5136 Other intervertebral disc degeneration, lumbar region: Secondary | ICD-10-CM | POA: Diagnosis not present

## 2019-10-22 MED ORDER — GABAPENTIN 600 MG PO TABS: 900.0000 mg | ORAL_TABLET | Freq: Three times a day (TID) | ORAL | 0 refills | Status: DC

## 2019-10-22 MED ORDER — METHYLPREDNISOLONE ACETATE 80 MG/ML IJ SUSP
80.0000 mg | Freq: Once | INTRAMUSCULAR | Status: AC
  Administered 2019-10-22: 80 mg via INTRAMUSCULAR

## 2019-10-22 NOTE — Assessment & Plan Note (Signed)
Increased pain with sciatica into L leg.  Recommend restarting gabapentin at 900mg  TID.  Given injection of depo medrol 80mg  today, as she had some relief with prednisone previously.  She is requesting a few percocet today which I declined to prescribe.  Recommend she keep appt for PT.   May need to repeat MRI if not improving.

## 2019-10-22 NOTE — Progress Notes (Signed)
[name] - 69 y.o. female MRN [id number]  Date of birth: 08/02/1950  Subjective Chief Complaint  Patient presents with  . Leg Pain    HPI [name] is a 69 y.o. female with history of lumbar DDD, RA, hypothyroidism, opioid dependence and T2DM here today with complaint of low back pain with sciatica.  Pain in on L side with radiation into L leg and buttock.  She denies increased weakness, numbness or tingling.  She was seen by Ander Slade last week for same problem. She was given toradol injection and prednisone with referral to PT.  She reports that toradol did not help.  Prednisone may have helped some.  She does also have rx for gabapentin but had stopped taking it because she felt like it didn't do a whole lot for her.  She recently re-ordered.  She also has rx for suboxone, she reports that she is not taking this.  Last filled 10/08/19 per PDMP.  ROS:  A comprehensive ROS was completed and negative except as noted per HPI    Allergies  Allergen Reactions  . Codeine Palpitations and Other (See Comments)  . Naproxen Rash and Palpitations    GI upset  . Budesonide-Formoterol Fumarate Rash    REACTION: rash REACTION: rash REACTION: rash  . Duloxetine Hcl Other (See Comments)    No difference for pain or anti-depressant.  Other reaction(s): Other No difference for pain or anti-depressant. No difference for pain or anti-depressant.  . Fentanyl Other (See Comments)    REACTION: severe aggitation, mood changes Other reaction(s): Hallucination, Psychiatric Hallucinations Hallucinations REACTION: severe aggitation, mood changes   . Tramadol-Acetaminophen     Other reaction(s): Unknown  . Effexor Xr [Venlafaxine Hcl Er]     Felt more anxious or no difference.   . Fluticasone-Salmeterol   . Lisinopril Rash    Maybe we dont really know    Past Medical History:  Diagnosis Date  . CAD (coronary artery disease)   . Cervicalgia   . Chronic pain   . COPD (chronic obstructive  pulmonary disease) (HCC)   . DDD (degenerative disc disease), lumbar   . Diabetes mellitus   . Fatty liver   . Hyperlipidemia   . Narcotic abuse (HCC)    history  . Osteoarthritis   . Osteoporosis   . Rheumatoid arthritis (HCC)   . Thyroid disease    hypothyroid    Past Surgical History:  Procedure Laterality Date  . BREAST BIOPSY    . BREAST EXCISIONAL BIOPSY    . BREAST SURGERY     braest lump/ benign  . CHOLECYSTECTOMY    . LUMBAR LAMINECTOMY  05/2010   Dr. Netta Corrigan  . TOTAL KNEE ARTHROPLASTY  2010   right    Social History   Socioeconomic History  . Marital status: Married    Spouse name: Not on file  . Number of children: 2  . Years of education: Not on file  . Highest education level: Not on file  Occupational History  . Not on file  Tobacco Use  . Smoking status: Current Every Day Smoker    Packs/day: 1.00    Years: 43.00    Pack years: 43.00    Types: Cigarettes  . Smokeless tobacco: Never Used  . Tobacco comment: Has tried Chantix and Wellbutrin in the past  Substance and Sexual Activity  . Alcohol use: Yes    Alcohol/week: 5.0 standard drinks    Types: 5 Standard drinks or equivalent per week  Comment: 1 beer per day  . Drug use: No  . Sexual activity: Not on file  Other Topics Concern  . Not on file  Social History Narrative  . Not on file   Social Determinants of Health   Financial Resource Strain:   . Difficulty of Paying Living Expenses:   Food Insecurity:   . Worried About Charity fundraiser in the Last Year:   . Arboriculturist in the Last Year:   Transportation Needs:   . Film/video editor (Medical):   [name] Lack of Transportation (Non-Medical):   Physical Activity:   . Days of Exercise per Week:   . Minutes of Exercise per Session:   Stress:   . Feeling of Stress :   Social Connections:   . Frequency of Communication with Friends and Family:   . Frequency of Social Gatherings with Friends and Family:   . Attends Religious  Services:   . Active Member of Clubs or Organizations:   . Attends Archivist Meetings:   [name] Marital Status:     Family History  Problem Relation Age of Onset  . Heart disease Mother 31       MI  . Hyperlipidemia Mother   . Rheum arthritis Mother   . Lymphoma Mother   . Heart disease Father 15       MI  . Diabetes Father   . Rheum arthritis Father   . Cancer Maternal Grandmother        hodgkins lympoma  . Cirrhosis Sister        primary biliary    Health Maintenance  Topic Date Due  . OPHTHALMOLOGY EXAM  09/03/2018  . HEMOGLOBIN A1C  09/12/2019  . TETANUS/TDAP  03/13/2020 (Originally 10/16/2017)  . INFLUENZA VACCINE  12/30/2019  . FOOT EXAM  02/12/2020  . Fecal DNA (Cologuard)  09/23/2020  . MAMMOGRAM  03/20/2021  . DEXA SCAN  Completed  . COVID-19 Vaccine  Completed  . Hepatitis C Screening  Completed  . PNA vac Low Risk Adult  Completed     ----------------------------------------------------------------------------------------------------------------------------------------------------------------------------------------------------------------- Physical Exam BP (!) 160/110 (BP Location: Left Arm, Patient Position: Sitting, Cuff Size: Small)   Pulse (!) 117   Ht 5' 2.99" (1.6 m)   Wt 131 lb (59.4 kg)   SpO2 97%   BMI 23.21 kg/m   Physical Exam Constitutional:      Appearance: Normal appearance.  HENT:     Head: Normocephalic and atraumatic.  Eyes:     General: No scleral icterus. Cardiovascular:     Rate and Rhythm: Normal rate and regular rhythm.  Pulmonary:     Effort: Pulmonary effort is normal.     Breath sounds: Normal breath sounds.  Musculoskeletal:     Cervical back: Neck supple.     Comments: ROM of lumbar spine limited due to pain.  SLR + on L Normal strength bilaterally.   Skin:    General: Skin is warm.  Neurological:     General: No focal deficit present.     Mental Status: She is alert.      ------------------------------------------------------------------------------------------------------------------------------------------------------------------------------------------------------------------- Assessment and Plan  Lumbar degenerative disc disease Increased pain with sciatica into L leg.  Recommend restarting gabapentin at 900mg  TID.  Given injection of depo medrol 80mg  today, as she had some relief with prednisone previously.  She is requesting a few percocet today which I declined to prescribe.  Recommend she keep appt for PT.   May need to  repeat MRI if not improving.    Meds ordered this encounter  Medications  . gabapentin (NEURONTIN) 600 MG tablet    Sig: Take 1.5 tablets (900 mg total) by mouth 3 (three) times daily.    Dispense:  405 tablet    Refill:  0    NEED NEW RX    No follow-ups on file.    This visit occurred during the SARS-CoV-2 public health emergency.  Safety protocols were in place, including screening questions prior to the visit, additional usage of staff PPE, and extensive cleaning of exam room while observing appropriate contact time as indicated for disinfecting solutions.

## 2019-10-22 NOTE — Patient Instructions (Addendum)
Keep appointment with PT Increase gabapentin to 900mg  three times per day.  Let know if this doesn't improve.

## 2019-10-25 ENCOUNTER — Encounter: Payer: Self-pay | Admitting: Physical Therapy

## 2019-10-25 ENCOUNTER — Other Ambulatory Visit: Payer: Self-pay

## 2019-10-25 ENCOUNTER — Ambulatory Visit (INDEPENDENT_AMBULATORY_CARE_PROVIDER_SITE_OTHER): Payer: Medicare Other | Admitting: Physical Therapy

## 2019-10-25 DIAGNOSIS — M5442 Lumbago with sciatica, left side: Secondary | ICD-10-CM | POA: Diagnosis not present

## 2019-10-25 DIAGNOSIS — M6281 Muscle weakness (generalized): Secondary | ICD-10-CM

## 2019-10-25 DIAGNOSIS — R2689 Other abnormalities of gait and mobility: Secondary | ICD-10-CM | POA: Diagnosis not present

## 2019-10-25 DIAGNOSIS — M79605 Pain in left leg: Secondary | ICD-10-CM

## 2019-10-25 NOTE — Therapy (Signed)
[facility] [address] [place], [place], [address] Phone: [phone]   Fax:  [phone]  Physical Therapy Evaluation  Patient Details  Name: [name] MRN: [id number] Date of Birth: November 30, 1969 Referring Provider (PT): Christen Butter   Encounter Date: 10/25/2019  PT End of Session - 10/25/19 1611    Visit Number  1    Number of Visits  12    Date for PT Re-Evaluation  12/06/19    PT Start Time  1408    PT Stop Time  1445    PT Time Calculation (min)  37 min    Activity Tolerance  Patient tolerated treatment well    Behavior During Therapy  [facility] for tasks assessed/performed       Past Medical History:  Diagnosis Date  . CAD (coronary artery disease)   . Cervicalgia   . Chronic pain   . COPD (chronic obstructive pulmonary disease) (HCC)   . DDD (degenerative disc disease), lumbar   . Diabetes mellitus   . Fatty liver   . Hyperlipidemia   . Narcotic abuse (HCC)    history  . Osteoarthritis   . Osteoporosis   . Rheumatoid arthritis (HCC)   . Thyroid disease    hypothyroid    Past Surgical History:  Procedure Laterality Date  . BREAST BIOPSY    . BREAST EXCISIONAL BIOPSY    . BREAST SURGERY     braest lump/ benign  . CHOLECYSTECTOMY    . LUMBAR LAMINECTOMY  05/2010   Dr. Netta Corrigan  . TOTAL KNEE ARTHROPLASTY  2010   right    There were no vitals filed for this visit.   Subjective Assessment - 10/25/19 1606    Subjective  Pt comes into clinic with reports of L side and L leg pain (localized in low back and flank). Pt reports that a "back rub" seems to help. She states that the doctor reports this is from arthritis. Pt reports hurting within the last month pretty much all the time. Pt notes constant dull pain. Occasional sharp shooting down the front of her shin occurs. Reports taking gabapanten but has not been helpful. Pt is fully retired and reports mostly watch soap operas at home. Pt uses cane  occasionally. 70/10 pain constantly. Her pain wakes pt up at night.    Limitations  Sitting;Standing;Walking;House hold activities    Patient Stated Goals  Reduce pain    Currently in Pain?  Yes    Pain Score  7     Pain Location  Back    Pain Orientation  Left    Pain Descriptors / Indicators  Aching;Throbbing;Shooting    Pain Type  Acute pain    Pain Radiating Towards  L front shin    Pain Onset  1 to 4 weeks ago    Pain Frequency  Constant    Pain Relieving Factors  None noted         [facility] PT Assessment - 10/25/19 0001      Assessment   Medical Diagnosis  M79.605 (ICD-10-CM) - Left leg pain    Referring Provider (PT)  Christen Butter    Next MD Visit  None noted    Prior Therapy  Yes - back/cervical pain      Balance Screen   Has the patient fallen in the past 6 months  No      Home Environment   Living Environment  Private residence    Living Arrangements  Spouse/significant other    Home Access  Level entry    Home Equipment  Walker - 4 wheels;Gilmer Mor - single point      Prior Function   Level of Independence  Independent    Vocation  Retired    Leisure  Tourist information centre manager      Observation/Other Assessments   Focus on Therapeutic Outcomes (FOTO)   Unable to obtain internet access at time of eval for FOTO      Posture/Postural Control   Posture Comments  Anteriorly flexed trunk, bilateral knees flexed      AROM   Right Knee Extension  -10   Prior TKA   Left Knee Extension  -5    Lumbar Flexion  ~25% impaired   Mild increase in pain   Lumbar Extension  WFL    Lumbar - Right Side Bend  ~25% limited    Lumbar - Left Side Bend  ~50% limited   Pain in hip   Lumbar - Right Rotation  WFL    Lumbar - Left Rotation  Scnetx      Strength   Right Hip Flexion  3+/5    Right Hip Extension  4-/5    Right Hip ABduction  4-/5    Left Hip Flexion  3+/5    Left Hip Extension  4-/5    Left Hip ABduction  4-/5    Right Knee Flexion  5/5    Right Knee Extension  4/5    Left  Knee Flexion  5/5    Left Knee Extension  4/5      Flexibility   Hamstrings  WFL    Quadriceps  ~80 deg bilaterally      Palpation   Palpation comment  Increased multifidi tone; TTP L piriformis and glute      FABER test   findings  Positive    Side  LEft    Comment  Feels in hip      Thomas Test    Findings  Positive    Side  Left      Ambulation/Gait   Ambulation/Gait  Yes    Ambulation/Gait Assistance  5: Supervision    Ambulation Distance (Feet)  50 Feet    Assistive device  Straight cane   Initially used cane; didn't use A/D on exit   Gait Pattern  Trunk flexed;Antalgic   Knees flexed                 Objective measurements completed on examination: See above findings.      [facility] Adult PT Treatment/Exercise - 10/25/19 0001      Exercises   Exercises  Lumbar;Knee/Hip      Lumbar Exercises: Stretches   Piriformis Stretch  30 seconds;Left;Right   In sitting   Other Lumbar Stretch Exercise  Modified thomas stretch 30 sec, bilaterally      Lumbar Exercises: Supine   Bridge  10 reps;2 seconds   cues to contract abdominals     Modalities   Modalities  Electrical Stimulation   Offered to pt; however, she declines at this time            PT Education - 10/25/19 1610    Education Details  Discussed that manual therapy is good; however, she needs exercises and stretches she can do for herself that will help with symptoms long term. HEP provided.    Person(s) Educated  Patient    Methods  Explanation;Demonstration;Handout;Verbal cues    Comprehension  Verbalized  understanding;Returned demonstration       PT Short Term Goals - 10/25/19 1624      PT SHORT TERM GOAL #1   Title  Pt will report decrease in pain to <5/10    Time  3    Period  Weeks    Status  New    Target Date  11/15/19        PT Long Term Goals - 10/25/19 1627      PT LONG TERM GOAL #1   Title  Pt will be independent with HEP    Time  6    Period  Weeks    Status   New    Target Date  12/06/19      PT LONG TERM GOAL #2   Title  Pt will report </= 3/10 pain    Baseline  7/10 pain currently    Time  6    Period  Weeks    Status  New    Target Date  12/06/19      PT LONG TERM GOAL #3   Title  Pt will improve bilateral hip strength to 5/5    Baseline  3+/5 hip flexion, 4/5 hip abduction, 3+/5 hip extension    Time  6    Period  Weeks    Status  New    Target Date  12/06/19      PT LONG TERM GOAL #4   Title  Pt will demonstrate improved standing and walking posture with reduced forward trunk flexion for improved gait mechanics    Time  6    Period  Weeks    Status  New    Target Date  12/06/19             Plan - 10/25/19 1614    Clinical Impression Statement  Pt is a 69 y/o F presenting to clinic with L low back/leg pain radiating to front of shin intermittently (Consider possible femoral nerve pathology). Pt presents with decreased leg strength and hip flexor flexibility, mobility, trunk forward flexed, and pain affecting pt's ability to sleep at night, walk and perform home tasks.    Personal Factors and Comorbidities  Age;Behavior Pattern;Social Background;Fitness;Comorbidity 1    Comorbidities  COPD, arthritis, anxiety    Examination-Activity Limitations  Sleep;Carry;Bend    Examination-Participation Restrictions  Cleaning;Community Activity    Clinical Decision Making  Moderate    Rehab Potential  Fair    PT Frequency  2x / week    PT Duration  6 weeks    PT Treatment/Interventions  ADLs/Self Care Home Management;Cryotherapy;Electrical Stimulation;Iontophoresis 4mg /ml Dexamethasone;Moist Heat;Ultrasound;Gait training;Stair training;Functional mobility training;Therapeutic activities;Therapeutic exercise;Balance training;Neuromuscular re-education;Patient/family education;Manual techniques;Passive range of motion;Dry needling    PT Next Visit Plan  Progress stretching and strengthening as able. Progress core exercises.    PT Home  Exercise Plan  Access Code: [email or web address]    Consulted and Agree with Plan of Care  Patient       Patient will benefit from skilled therapeutic intervention in order to improve the following deficits and impairments:  Abnormal gait, Decreased endurance, Decreased mobility, Hypomobility, Decreased range of motion, Improper body mechanics, Decreased activity tolerance, Decreased strength, Increased fascial restricitons, Impaired flexibility, Postural dysfunction, Pain  Visit Diagnosis: Pain in left leg  Acute left-sided low back pain with left-sided sciatica  Other abnormalities of gait and mobility  Muscle weakness (generalized)     Problem List Patient Active Problem List   Diagnosis Date Noted  . Arthralgia of  lower leg 10/22/2019  . Bilateral hand swelling 07/16/2019  . Bilateral hand pain 07/16/2019  . SIRS (systemic inflammatory response syndrome) ([place]) 06/04/2019  . Sepsis ([place]) 06/04/2019  . Excessive sleepiness 05/15/2019  . No appetite 05/15/2019  . Fatigue 12/11/2018  . Fixed pupil of both eyes 07/18/2018  . Frequent headaches 07/18/2018  . Unsteady gait 07/18/2018  . Primary osteoarthritis 07/14/2018  . Gastroesophageal reflux disease with esophagitis 07/14/2018  . Vitamin D deficiency 04/05/2018  . Primary insomnia 04/05/2018  . Hiatal hernia 04/05/2018  . Vasovagal syncope 12/16/2017  . History of diverticulitis 11/21/2017  . Non-intractable vomiting with nausea 11/21/2017  . Left lower quadrant guarding 11/21/2017  . Generalized abdominal pain 11/21/2017  . Positive colorectal cancer screening using Cologuard test 10/07/2017  . Microalbuminuria 10/07/2017  . Weakness of both lower extremities 10/07/2017  . Bilateral cataracts 09/07/2017  . Dry eye syndrome of bilateral lacrimal glands 09/07/2017  . Moderate episode of recurrent major depressive disorder ([place]) 07/24/2017  . Bilateral calf pain 07/24/2017  . Memory changes 07/24/2017  . Abnormal finding on GI  tract imaging 05/25/2017  . Lumbar degenerative disc disease 05/13/2017  . Common bile duct dilatation 04/27/2017  . Left upper arm swelling secondary to rotator cuff tear and fluid tracking down biceps tendon sheath 04/14/2017  . Anxiety 03/12/2017  . Rotator cuff tendonitis, left 03/10/2017  . Myocardial infarct ([place]) 03/09/2017  . CAD (coronary artery disease) 03/09/2017  . RA (rheumatoid arthritis) ([place]) 07/06/2016  . Atherosclerosis of native coronary artery of native heart without angina pectoris 03/16/2016  . Chronic back pain 02/27/2016  . Acquired hypothyroidism 12/01/2015  . COPD (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 ([place]) 03/05/2015  . Hyperlipidemia 01/22/2015  . COLD (chronic obstructive lung disease) ([place]) 12/06/2013  . Acute pain of left shoulder 09/23/2013  . Ganglion cyst of wrist 07/13/2013  . Anxiety state 05/16/2013  . Trochanteric bursitis of right hip 05/04/2013  . Diabetes type 2, controlled ([place]) 03/14/2013  . Hypothyroidism 03/14/2013  . Sacroiliitis ([place]) 09/14/2011  . Chronic headache 04/03/2011  . Tobacco use 04/03/2011  . Chronic pain 04/03/2011  . Benign hypertension 04/03/2011  . Allergic rhinitis 10/06/2010  . HYPERCALCEMIA 11/13/2009  . ARTHRITIS, ACROMIOCLAVICULAR 07/31/2008  . SPINAL STENOSIS IN CERVICAL REGION 07/31/2008  . OSTEOPOROSIS 10/17/2007  . Mixed hyperlipidemia 09/28/2007  . MDD (major depressive disorder), recurrent episode, mild ([place]) 09/28/2007  . FATTY LIVER DISEASE 09/28/2007  . Elevated liver function tests 09/28/2007  . Chronic pain syndrome 06/05/2007    [facility] April Ma L Brendi Mccarroll PT, DPT 10/25/2019, 4:46 PM  [facility] [address] [place], [place], [address] Phone: [phone]   Fax:  [phone]  Name: AURIELLA WIEAND MRN: [id number] Date of Birth: 22-Dec-1950

## 2019-10-25 NOTE — Patient Instructions (Signed)
Access Code: [email or web address] URL: [email or web address] Date: 10/25/2019 Prepared by: Vernon Prey April Kirstie Peri  Exercises Seated Piriformis Stretch - 1 x daily - 7 x weekly - 2 sets - 30 hold Modified Thomas Stretch - 1 x daily - 7 x weekly - 2 sets - 30 hold Supine Bridge - 1 x daily - 7 x weekly - 2 sets - 10 reps

## 2019-10-30 ENCOUNTER — Encounter: Payer: Medicare Other | Admitting: Physical Therapy

## 2019-11-05 ENCOUNTER — Other Ambulatory Visit: Payer: Self-pay

## 2019-11-05 ENCOUNTER — Ambulatory Visit (INDEPENDENT_AMBULATORY_CARE_PROVIDER_SITE_OTHER): Payer: Medicare Other | Admitting: Physical Therapy

## 2019-11-05 ENCOUNTER — Encounter: Payer: Self-pay | Admitting: Family Medicine

## 2019-11-05 ENCOUNTER — Ambulatory Visit (INDEPENDENT_AMBULATORY_CARE_PROVIDER_SITE_OTHER): Payer: Medicare Other | Admitting: Family Medicine

## 2019-11-05 VITALS — BP 125/78 | HR 100 | Ht 62.99 in | Wt 135.0 lb

## 2019-11-05 DIAGNOSIS — R2689 Other abnormalities of gait and mobility: Secondary | ICD-10-CM

## 2019-11-05 DIAGNOSIS — M79605 Pain in left leg: Secondary | ICD-10-CM

## 2019-11-05 DIAGNOSIS — M5416 Radiculopathy, lumbar region: Secondary | ICD-10-CM | POA: Insufficient documentation

## 2019-11-05 DIAGNOSIS — M6281 Muscle weakness (generalized): Secondary | ICD-10-CM

## 2019-11-05 DIAGNOSIS — M5442 Lumbago with sciatica, left side: Secondary | ICD-10-CM | POA: Diagnosis not present

## 2019-11-05 MED ORDER — METHYLPREDNISOLONE 4 MG PO TBPK
ORAL_TABLET | ORAL | 0 refills | Status: DC
Start: 2019-11-05 — End: 2019-11-12

## 2019-11-05 MED ORDER — KETOROLAC TROMETHAMINE 60 MG/2ML IM SOLN
60.0000 mg | Freq: Once | INTRAMUSCULAR | Status: AC
  Administered 2019-11-05: 60 mg via INTRAMUSCULAR

## 2019-11-05 NOTE — Patient Instructions (Signed)
Continue with PT.  Take gabapentin consistently.  Start steroid taper.  Let us know if this continues to not improve with PT, we may need to get an MRI.

## 2019-11-05 NOTE — Assessment & Plan Note (Signed)
Will repeat toradol injection today.  Start medrol taper.  Continue PT and gabapentin She is asking for tramadol today, discussed that I would not provide any opioid pain medications to her.

## 2019-11-05 NOTE — Progress Notes (Signed)
OPLE GIRGIS - 69 y.o. female MRN [id number]  Date of birth: Oct 07, 1950  Subjective Chief Complaint  Patient presents with  . Leg Pain   HPI DALTON MILLE is a 69 y.o. female with history of lumbar DDD, RA, hypothyroidism, opioid dependence and T2DM here today with complaint of low back pain with radiation in the L buttock and leg.  I last saw her on 5/24.  She was given injection of depo-medrol  and recommended that she restart gabapentin.  She had tried toradol and prednisone prior to this.  She has started PT as well.  Reports that gabapentin does not seem to be helpful.  She did get some relief from toradol and prednisone previously.  She is also prescribed suboxone although she states that she has not been taking this.    ROS:  A comprehensive ROS was completed and negative except as noted per HPI   Allergies  Allergen Reactions  . Codeine Palpitations and Other (See Comments)  . Naproxen Rash and Palpitations    GI upset  . Budesonide-Formoterol Fumarate Rash    REACTION: rash REACTION: rash REACTION: rash  . Duloxetine Hcl Other (See Comments)    No difference for pain or anti-depressant.  Other reaction(s): Other No difference for pain or anti-depressant. No difference for pain or anti-depressant.  . Fentanyl Other (See Comments)    REACTION: severe aggitation, mood changes Other reaction(s): Hallucination, Psychiatric Hallucinations Hallucinations REACTION: severe aggitation, mood changes   . Tramadol-Acetaminophen     Other reaction(s): Unknown  . Effexor Xr [Venlafaxine Hcl Er]     Felt more anxious or no difference.   . Fluticasone-Salmeterol   . Lisinopril Rash    Maybe we dont really know    Past Medical History:  Diagnosis Date  . CAD (coronary artery disease)   . Cervicalgia   . Chronic pain   . COPD (chronic obstructive pulmonary disease) ([place])   . DDD (degenerative disc disease), lumbar   . Diabetes mellitus   . Fatty liver   . Hyperlipidemia    . Narcotic abuse ([place])    history  . Osteoarthritis   . Osteoporosis   . Rheumatoid arthritis ([place])   . Thyroid disease    hypothyroid    Past Surgical History:  Procedure Laterality Date  . BREAST BIOPSY    . BREAST EXCISIONAL BIOPSY    . BREAST SURGERY     braest lump/ benign  . CHOLECYSTECTOMY    . LUMBAR LAMINECTOMY  05/2010   Dr. Rushie Nyhan  . TOTAL KNEE ARTHROPLASTY  2010   right    Social History   Socioeconomic History  . Marital status: Married    Spouse name: Not on file  . Number of children: 2  . Years of education: Not on file  . Highest education level: Not on file  Occupational History  . Not on file  Tobacco Use  . Smoking status: Current Every Day Smoker    Packs/day: 1.00    Years: 43.00    Pack years: 43.00    Types: Cigarettes  . Smokeless tobacco: Never Used  . Tobacco comment: Has tried Chantix and Wellbutrin in the past  Substance and Sexual Activity  . Alcohol use: Yes    Alcohol/week: 5.0 standard drinks    Types: 5 Standard drinks or equivalent per week    Comment: 1 beer per day  . Drug use: No  . Sexual activity: Not on file  Other Topics Concern  .  Not on file  Social History Narrative  . Not on file   Social Determinants of Health   Financial Resource Strain:   . Difficulty of Paying Living Expenses:   Food Insecurity:   . Worried About Programme researcher, broadcasting/film/video in the Last Year:   . Barista in the Last Year:   Transportation Needs:   . Freight forwarder (Medical):   [name] Lack of Transportation (Non-Medical):   Physical Activity:   . Days of Exercise per Week:   . Minutes of Exercise per Session:   Stress:   . Feeling of Stress :   Social Connections:   . Frequency of Communication with Friends and Family:   . Frequency of Social Gatherings with Friends and Family:   . Attends Religious Services:   . Active Member of Clubs or Organizations:   . Attends Banker Meetings:   [name] Marital Status:      Family History  Problem Relation Age of Onset  . Heart disease Mother 62       MI  . Hyperlipidemia Mother   . Rheum arthritis Mother   . Lymphoma Mother   . Heart disease Father 62       MI  . Diabetes Father   . Rheum arthritis Father   . Cancer Maternal Grandmother        hodgkins lympoma  . Cirrhosis Sister        primary biliary    Health Maintenance  Topic Date Due  . OPHTHALMOLOGY EXAM  09/03/2018  . HEMOGLOBIN A1C  09/12/2019  . TETANUS/TDAP  03/13/2020 (Originally 10/16/2017)  . INFLUENZA VACCINE  12/30/2019  . FOOT EXAM  02/12/2020  . Fecal DNA (Cologuard)  09/23/2020  . MAMMOGRAM  03/20/2021  . DEXA SCAN  Completed  . COVID-19 Vaccine  Completed  . Hepatitis C Screening  Completed  . PNA vac Low Risk Adult  Completed     ----------------------------------------------------------------------------------------------------------------------------------------------------------------------------------------------------------------- Physical Exam BP 125/78 (BP Location: Left Arm, Patient Position: Supine, Cuff Size: Small)   Pulse 100   Ht 5' 2.99" (1.6 m)   Wt 135 lb (61.2 kg)   SpO2 (!) 88%   BMI 23.92 kg/m   Physical Exam Constitutional:      Appearance: Normal appearance.  Cardiovascular:     Rate and Rhythm: Normal rate and regular rhythm.  Musculoskeletal:     Cervical back: Neck supple.     Comments: ROM of lumbar spine limited due to pain.  SLR + on L Normal strength bilaterally. Walks with antalgic gait.   Skin:    General: Skin is warm and dry.  Neurological:     General: No focal deficit present.     Mental Status: She is alert.  Psychiatric:        Mood and Affect: Mood normal.     ------------------------------------------------------------------------------------------------------------------------------------------------------------------------------------------------------------------- Assessment and Plan  Left lumbar  radiculopathy Will repeat toradol injection today.  Start medrol taper.  Continue PT and gabapentin She is asking for tramadol today, discussed that I would not provide any opioid pain medications to her.     Meds ordered this encounter  Medications  . methylPREDNISolone (MEDROL DOSEPAK) 4 MG TBPK tablet    Sig: Taper as directed on packaging.    Dispense:  21 tablet    Refill:  0  . ketorolac (TORADOL) injection 60 mg    No follow-ups on file.    This visit occurred during the SARS-CoV-2 public health emergency.  Safety protocols were in place, including screening questions prior to the visit, additional usage of staff PPE, and extensive cleaning of exam room while observing appropriate contact time as indicated for disinfecting solutions.

## 2019-11-05 NOTE — Therapy (Addendum)
[place] [address] [place], [place], [address] Phone: [phone]   Fax:  [phone]  Physical Therapy Treatment and Discharge  Patient Details  Name: [name] MRN: [id number] Date of Birth: 1951/05/08 Referring Provider (PT): Samuel Bouche   Encounter Date: 11/05/2019  PT End of Session - 11/05/19 1159    Visit Number  2    Number of Visits  12    Date for PT Re-Evaluation  12/06/19    PT Start Time  1105    PT Stop Time  1140    PT Time Calculation (min)  35 min    Activity Tolerance  Patient limited by pain    Behavior During Therapy  [facility] for tasks assessed/performed       Past Medical History:  Diagnosis Date  . CAD (coronary artery disease)   . Cervicalgia   . Chronic pain   . COPD (chronic obstructive pulmonary disease) ([place])   . DDD (degenerative disc disease), lumbar   . Diabetes mellitus   . Fatty liver   . Hyperlipidemia   . Narcotic abuse ([place])    history  . Osteoarthritis   . Osteoporosis   . Rheumatoid arthritis ([place])   . Thyroid disease    hypothyroid    Past Surgical History:  Procedure Laterality Date  . BREAST BIOPSY    . BREAST EXCISIONAL BIOPSY    . BREAST SURGERY     braest lump/ benign  . CHOLECYSTECTOMY    . LUMBAR LAMINECTOMY  05/2010   Dr. Rushie Nyhan  . TOTAL KNEE ARTHROPLASTY  2010   right    There were no vitals filed for this visit.  Subjective Assessment - 11/05/19 1110    Subjective  Pt reports she misplaced her exercises, so she has not completed them.  She is hopeful to receive a cortisone shot at doctor's office today.    Patient Stated Goals  Reduce pain    Currently in Pain?  Yes    Pain Score  9     Pain Location  Leg   lateral lower   Pain Orientation  Left    Pain Descriptors / Indicators  Shooting;Nagging;Constant    Aggravating Factors   walking    Pain Relieving Factors  sleep         [facility] PT Assessment - 11/05/19 0001      Assessment    Medical Diagnosis  M79.605 (ICD-10-CM) - Left leg pain    Referring Provider (PT)  Samuel Bouche    Next MD Visit  None noted    Prior Therapy  Yes - back/cervical pain       [facility] Adult PT Treatment/Exercise - 11/05/19 0001      Ambulation/Gait   Gait Comments  lowered cane 3 notches to proper wrist height;  advised pt to ambulate with cane in Rt hand.       Lumbar Exercises: Stretches   Passive Hamstring Stretch  Left;2 reps;20 seconds   supine with strap; PTA assist   Single Knee to Chest Stretch  Right;Left;1 rep;10 seconds    Single Knee to Chest Stretch Limitations  Pain in LLE with Rt knee to chest     Lower Trunk Rotation  30 seconds   gentle rocking back and forth   Lower Trunk Rotation Limitations  inc pain with Lt rotation, stretch in Lt thigh with Rt rotation    Standing Extension  2 reps;5 seconds    Prone on Elbows  Stretch  2 reps;10 seconds    Prone on Elbows Stretch Limitations  limited tolerance, reported stretch on ant Lt thigh    Quad Stretch  Left;2 reps;Right;1 rep;10 seconds    Quad Stretch Limitations  limited tolerance (increased pain in thigh) decreased ROM ~50 deg     Piriformis Stretch  Left;2 reps;Right;1 rep;20 seconds   seated, per HEP   Figure 4 Stretch  1 rep;10 seconds      Lumbar Exercises: Aerobic   Nustep  L3: 5 min, arms/legs, limited range, slow speed      Lumbar Exercises: Supine   Bridge  10 reps;2 seconds      Modalities   Modalities  Electrical Stimulation;Moist Heat      Moist Heat Therapy   Number Minutes Moist Heat  8 Minutes   requested to take off early to use restrm, end of session.    Moist Heat Location  Knee;Hip   LLE     Electrical Stimulation   Electrical Stimulation Location  Lt lateral thigh    Electrical Stimulation Action  IFC    Electrical Stimulation Parameters  intensity to tolerance    unable to tolerate electrode on lateral lower leg.    Electrical Stimulation Goals  Pain      Manual Therapy   Manual  Therapy  Soft tissue mobilization    Soft tissue mobilization  roller stick and STM to Lt quad and lateral hip; pt instructed in how to use rolling pin at home to decrease fascial restrictions in LLE.                PT Short Term Goals - 10/25/19 1624      PT SHORT TERM GOAL #1   Title  Pt will report decrease in pain to <5/10    Time  3    Period  Weeks    Status  New    Target Date  11/15/19        PT Long Term Goals - 10/25/19 1627      PT LONG TERM GOAL #1   Title  Pt will be independent with HEP    Time  6    Period  Weeks    Status  New    Target Date  12/06/19      PT LONG TERM GOAL #2   Title  Pt will report </= 3/10 pain    Baseline  7/10 pain currently    Time  6    Period  Weeks    Status  New    Target Date  12/06/19      PT LONG TERM GOAL #3   Title  Pt will improve bilateral hip strength to 5/5    Baseline  3+/5 hip flexion, 4/5 hip abduction, 3+/5 hip extension    Time  6    Period  Weeks    Status  New    Target Date  12/06/19      PT LONG TERM GOAL #4   Title  Pt will demonstrate improved standing and walking posture with reduced forward trunk flexion for improved gait mechanics    Time  6    Period  Weeks    Status  New    Target Date  12/06/19            Plan - 11/05/19 1254    Clinical Impression Statement  Pt had limited tolerance for exercises due to increase pain with stretching of LLE.  Palpable  tightness/ tenderness along Lt an hip and thigh; limited tolerance for stretches and STM to these areas.  Liimited relief with estim/MHP to area.  Adjusted pt's cane height and advised pt to hold cane in Rt hand to off load pressure from LLE.  No goals met yet; only 2nd visit.    Personal Factors and Comorbidities  Age;Behavior Pattern;Social Background;Fitness;Comorbidity 1    Comorbidities  COPD, arthritis, anxiety    Examination-Activity Limitations  Sleep;Carry;Bend    Examination-Participation Restrictions  Cleaning;Community  Activity    Rehab Potential  Fair    PT Frequency  2x / week    PT Duration  6 weeks    PT Treatment/Interventions  ADLs/Self Care Home Management;Cryotherapy;Electrical Stimulation;Iontophoresis 71m/ml Dexamethasone;Moist Heat;Ultrasound;Gait training;Stair training;Functional mobility training;Therapeutic activities;Therapeutic exercise;Balance training;Neuromuscular re-education;Patient/family education;Manual techniques;Passive range of motion;Dry needling    PT Next Visit Plan  Progress stretching and strengthening as able. Progress core exercises.    PT Home Exercise Plan  Access Code: [email or web address]    Consulted and Agree with Plan of Care  Patient       Patient will benefit from skilled therapeutic intervention in order to improve the following deficits and impairments:  Abnormal gait, Decreased endurance, Decreased mobility, Hypomobility, Decreased range of motion, Improper body mechanics, Decreased activity tolerance, Decreased strength, Increased fascial restricitons, Impaired flexibility, Postural dysfunction, Pain  PHYSICAL THERAPY DISCHARGE SUMMARY  Visits from Start of Care: 2  Current functional level related to goals / functional outcomes: Unknown; pt not seen since 6/7. No goals met.   Remaining deficits: See treatment note above. Pt appears to have very limited tolerance to therapy's available resources (I.e. stretching, e-stim, heat)   Education / Equipment: Last education provided to pt discussed adjusting cane level to improve off loading of R LE and how to perform self myofacial release at home.  Plan:                                                    Patient goals were not met. Patient is being discharged due to not returning since the last visit.  ?????       Visit Diagnosis: Pain in left leg  Acute left-sided low back pain with left-sided sciatica  Other abnormalities of gait and mobility  Muscle weakness (generalized)     Problem List Patient Active  Problem List   Diagnosis Date Noted  . Arthralgia of lower leg 10/22/2019  . Bilateral hand swelling 07/16/2019  . Bilateral hand pain 07/16/2019  . SIRS (systemic inflammatory response syndrome) ([place] 06/04/2019  . Sepsis ([place] 06/04/2019  . Excessive sleepiness 05/15/2019  . No appetite 05/15/2019  . Fatigue 12/11/2018  . Fixed pupil of both eyes 07/18/2018  . Frequent headaches 07/18/2018  . Unsteady gait 07/18/2018  . Primary osteoarthritis 07/14/2018  . Gastroesophageal reflux disease with esophagitis 07/14/2018  . Vitamin D deficiency 04/05/2018  . Primary insomnia 04/05/2018  . Hiatal hernia 04/05/2018  . Vasovagal syncope 12/16/2017  . History of diverticulitis 11/21/2017  . Non-intractable vomiting with nausea 11/21/2017  . Left lower quadrant guarding 11/21/2017  . Generalized abdominal pain 11/21/2017  . Positive colorectal cancer screening using Cologuard test 10/07/2017  . Microalbuminuria 10/07/2017  . Weakness of both lower extremities 10/07/2017  . Bilateral cataracts 09/07/2017  . Dry eye syndrome of bilateral lacrimal glands 09/07/2017  . Moderate episode  of recurrent major depressive disorder ([place]) 07/24/2017  . Bilateral calf pain 07/24/2017  . Memory changes 07/24/2017  . Abnormal finding on GI tract imaging 05/25/2017  . Lumbar degenerative disc disease 05/13/2017  . Common bile duct dilatation 04/27/2017  . Left upper arm swelling secondary to rotator cuff tear and fluid tracking down biceps tendon sheath 04/14/2017  . Anxiety 03/12/2017  . Rotator cuff tendonitis, left 03/10/2017  . Myocardial infarct ([place]) 03/09/2017  . CAD (coronary artery disease) 03/09/2017  . RA (rheumatoid arthritis) ([place]) 07/06/2016  . Atherosclerosis of native coronary artery of native heart without angina pectoris 03/16/2016  . Chronic back pain 02/27/2016  . Acquired hypothyroidism 12/01/2015  . COPD (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 ([place])  03/05/2015  . Hyperlipidemia 01/22/2015  . COLD (chronic obstructive lung disease) ([place]) 12/06/2013  . Acute pain of left shoulder 09/23/2013  . Ganglion cyst of wrist 07/13/2013  . Anxiety state 05/16/2013  . Trochanteric bursitis of right hip 05/04/2013  . Diabetes type 2, controlled ([place]) 03/14/2013  . Hypothyroidism 03/14/2013  . Sacroiliitis ([place]) 09/14/2011  . Chronic headache 04/03/2011  . Tobacco use 04/03/2011  . Chronic pain 04/03/2011  . Benign hypertension 04/03/2011  . Allergic rhinitis 10/06/2010  . HYPERCALCEMIA 11/13/2009  . ARTHRITIS, ACROMIOCLAVICULAR 07/31/2008  . SPINAL STENOSIS IN CERVICAL REGION 07/31/2008  . OSTEOPOROSIS 10/17/2007  . Mixed hyperlipidemia 09/28/2007  . MDD (major depressive disorder), recurrent episode, mild ([place]) 09/28/2007  . FATTY LIVER DISEASE 09/28/2007  . Elevated liver function tests 09/28/2007  . Chronic pain syndrome 06/05/2007   [facility] April Gordy Levan, [place], DPT 12/12/2019 4:38 PM  Kerin Perna, PTA 11/05/19 1:07 PM  [facility] [place], [place], [address] Phone: [phone]   Fax:  [phone]  Name: BREALYNN CONTINO MRN: [id number] Date of Birth: [birth year]

## 2019-11-08 ENCOUNTER — Telehealth: Payer: Self-pay | Admitting: Physician Assistant

## 2019-11-08 NOTE — Telephone Encounter (Signed)
Patient called about memory issues. Are you ok with double booking on your schedule?  Ill call the PT back with the updated information.

## 2019-11-09 NOTE — Telephone Encounter (Signed)
yes

## 2019-11-12 ENCOUNTER — Encounter: Payer: Self-pay | Admitting: Physician Assistant

## 2019-11-12 ENCOUNTER — Ambulatory Visit (INDEPENDENT_AMBULATORY_CARE_PROVIDER_SITE_OTHER): Payer: Medicare Other | Admitting: Physician Assistant

## 2019-11-12 ENCOUNTER — Other Ambulatory Visit: Payer: Self-pay

## 2019-11-12 VITALS — BP 162/95 | HR 94 | Ht 63.0 in | Wt 138.0 lb

## 2019-11-12 DIAGNOSIS — R809 Proteinuria, unspecified: Secondary | ICD-10-CM

## 2019-11-12 DIAGNOSIS — I10 Essential (primary) hypertension: Secondary | ICD-10-CM | POA: Diagnosis not present

## 2019-11-12 DIAGNOSIS — R413 Other amnesia: Secondary | ICD-10-CM

## 2019-11-12 MED ORDER — DONEPEZIL HCL 10 MG PO TABS
5.0000 mg | ORAL_TABLET | Freq: Every evening | ORAL | 3 refills | Status: DC | PRN
Start: 2019-11-12 — End: 2020-01-01

## 2019-11-12 NOTE — Progress Notes (Addendum)
Subjective:    Patient ID: [name], female    DOB: [birth year], 69 y.o.   MRN: [id number]  HPI  [name] is a 69 year old female presenting with ongoing memory difficulties. She states that her  son has noticed a decline in her memory recently and she does admit she thinks she has gotten worse since her last visit. She states that her son has noticed she has been forgetting things he has told her about his friends. She states in general she cannot remember things from ~3 months ago, but she can remember long term things from years ago. She denies agitation, anger, forgetting names, or forgetting day-to-day things. MRI done twice this year with normal findings.   She states she does recognize that her medications do adverse effects on memory. She admits she has tried taking a few days off of Xanax and trazodone. She denies noticing a difference in memory. She states her neck pain was worse of Xanax, but her slleep was unchanged off trazodone. Admits to occasional glass of wine.   HTN - pt BP elevated at 177/93. Patient denied taking medications today. States she takes them in the afternoon. Denies checking BP at home. Denies headaches, vision changes, dizziness, lightheadedness. She checks at home and running 120's over 80's.   .. Active Ambulatory Problems    Diagnosis Date Noted  . Mixed hyperlipidemia 09/28/2007  . HYPERCALCEMIA 11/13/2009  . MDD (major depressive disorder), recurrent episode, mild ([place]) 09/28/2007  . Chronic pain syndrome 06/05/2007  . FATTY LIVER DISEASE 09/28/2007  . ARTHRITIS, ACROMIOCLAVICULAR 07/31/2008  . SPINAL STENOSIS IN CERVICAL REGION 07/31/2008  . OSTEOPOROSIS 10/17/2007  . Elevated liver function tests 09/28/2007  . Allergic rhinitis 10/06/2010  . Chronic headache 04/03/2011  . Tobacco use 04/03/2011  . Chronic pain 04/03/2011  . Benign hypertension 04/03/2011  . Sacroiliitis ([place]) 09/14/2011  . Diabetes type 2, controlled ([place]) 03/14/2013  .  Hypothyroidism 03/14/2013  . Trochanteric bursitis of right hip 05/04/2013  . Anxiety state 05/16/2013  . Ganglion cyst of wrist 07/13/2013  . Acute pain of left shoulder 09/23/2013  . COLD (chronic obstructive lung disease) ([place]) 12/06/2013  . Hyperlipidemia 01/22/2015  . COPD (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 ([place]) 03/05/2015  . Myocardial infarct ([place]) 03/09/2017  . CAD (coronary artery disease) 03/09/2017  . Rotator cuff tendonitis, left 03/10/2017  . Anxiety 03/12/2017  . Left upper arm swelling secondary to rotator cuff tear and fluid tracking down biceps tendon sheath 04/14/2017  . Common bile duct dilatation 04/27/2017  . Lumbar degenerative disc disease 05/13/2017  . Abnormal finding on GI tract imaging 05/25/2017  . Chronic back pain 02/27/2016  . Atherosclerosis of native coronary artery of native heart without angina pectoris 03/16/2016  . RA (rheumatoid arthritis) ([place]) 07/06/2016  . Acquired hypothyroidism 12/01/2015  . Moderate episode of recurrent major depressive disorder ([place]) 07/24/2017  . Bilateral calf pain 07/24/2017  . Memory changes 07/24/2017  . Bilateral cataracts 09/07/2017  . Dry eye syndrome of bilateral lacrimal glands 09/07/2017  . Positive colorectal cancer screening using Cologuard test 10/07/2017  . Microalbuminuria 10/07/2017  . Weakness of both lower extremities 10/07/2017  . History of diverticulitis 11/21/2017  . Non-intractable vomiting with nausea 11/21/2017  . Left lower quadrant guarding 11/21/2017  . Generalized abdominal pain 11/21/2017  . Vasovagal syncope 12/16/2017  . Vitamin D deficiency 04/05/2018  . Primary insomnia 04/05/2018  . Hiatal hernia 04/05/2018  . Primary osteoarthritis 07/14/2018  . Gastroesophageal  reflux disease with esophagitis 07/14/2018  . Fixed pupil of both eyes 07/18/2018  . Frequent headaches 07/18/2018  . Unsteady gait 07/18/2018  . Fatigue 12/11/2018  . Excessive sleepiness  05/15/2019  . No appetite 05/15/2019  . SIRS (systemic inflammatory response syndrome) ([facility]) 06/04/2019  . Sepsis ([facility]) 06/04/2019  . Bilateral hand swelling 07/16/2019  . Bilateral hand pain 07/16/2019  . Arthralgia of lower leg 10/22/2019  . Left lumbar radiculopathy 11/05/2019   Resolved Ambulatory Problems    Diagnosis Date Noted  . DM (diabetes mellitus) (H[facility]) 06/05/2007  . HEMORRHOIDS, WITH BLEEDING 04/22/2008  . Acute maxillary sinusitis 04/10/2010  . Alcoholic fatty liver 12/24/2009  . RENAL INSUFFICIENCY, ACUTE 10/23/2008  . ROTATOR CUFF SYNDROME, RIGHT 04/01/2008  . ALLERGIC REACTION 12/24/2009  . ELEVATED BLOOD PRESSURE WITHOUT DIAGNOSIS OF HYPERTENSION 05/13/2010  . CHRONIC OBSTRUCTIVE PULMONARY DISEASE, ACUTE EXACERBATION 08/14/2010  . Recurrent acute sinusitis 09/09/2010  . Allergic dermatitis due to poison ivy 11/27/2010  . DERMATITIS, ATOPIC 01/15/2011  . Arthritis of carpometacarpal joint 02/14/2012  . Ganglion cyst of left foot 07/13/2013  . Chronic pain syndrome 12/05/2013  . Primary osteoarthritis of left wrist 12/10/2014  . Rheumatoid arthritis involving both hands (H[facility]) 03/09/2017  . Bulging lumbar disc 02/27/2016  . Pyelonephritis 11/21/2017  . Upper abdominal pain 03/10/2018  . Patellar sleeve fracture, right, closed, initial encounter 04/24/2018   Past Medical History:  Diagnosis Date  . Cervicalgia   . COPD (chronic obstructive pulmonary disease) ([facility])   . DDD (degenerative disc disease), lumbar   . Diabetes mellitus   . Fatty liver   . Narcotic abuse ([facility])   . Osteoarthritis   . Osteoporosis   . Rheumatoid arthritis ([facility])   . Thyroid disease    .[name] Current Outpatient Medications on File Prior to Visit  Medication Sig Dispense Refill  . alendronate (FOSAMAX) 70 MG tablet TAKE ONE TABLET EVERY 7 DAYS WITH A FULL GLASS OF WATER ON AN EMPTY STOMACH 4 tablet 11  . ALPRAZolam (XANAX) 1 MG tablet Take 1 tablet (1 mg total) by mouth at bedtime as  needed. 30 tablet 2  . aspirin EC 81 MG tablet Take 81 mg by mouth daily.    [name] atorvastatin (LIPITOR) 80 MG tablet TAKE ONE TABLET BY MOUTH EVERY DAY 90 tablet 2  . Buprenorphine HCl-Naloxone HCl 8-2 MG FILM PLACE TWO FILMS UNDER THE TONGUE EVERY DAY    . buPROPion (WELLBUTRIN XL) 150 MG 24 hr tablet TAKE 1 TABLET BY MOUTH EVERY DAY IN THE MORNING 90 tablet 3  . cetirizine (ZYRTEC) 10 MG tablet Take 10 mg by mouth daily.    [name] desvenlafaxine (PRISTIQ) 100 MG 24 hr tablet Take 1 tablet (100 mg total) by mouth daily. 90 tablet 3  . diclofenac sodium (VOLTAREN) 1 % GEL Apply 4 g topically 4 (four) times daily. To affected joint. 100 g 1  . famotidine (PEPCID) 20 MG tablet Take 1 tablet (20 mg total) by mouth 2 (two) times daily. 60 tablet 5  . fluticasone furoate-vilanterol (BREO ELLIPTA) 100-25 MCG/INH AEPB Inhale 1 puff into the lungs daily. 1 each 11  . gabapentin (NEURONTIN) 600 MG tablet Take 1.5 tablets (900 mg total) by mouth 3 (three) times daily. 405 tablet 0  . hydroxychloroquine (PLAQUENIL) 200 MG tablet TK 1 T PO D    . levothyroxine (SYNTHROID) 150 MCG tablet TAKE 1 TABLET (150 MCG TOTAL) BY MOUTH DAILY. NEEDS LABS 15 tablet 0  . lisinopril (PRINIVIL,ZESTRIL) 2.5 MG tablet Take  1 tablet (2.5 mg total) by mouth daily. 90 tablet 1  . meclizine (ANTIVERT) 25 MG tablet Take 1 tablet (25 mg total) by mouth 3 (three) times daily as needed for dizziness. 30 tablet 0  . metFORMIN (GLUCOPHAGE) 500 MG tablet TAKE 1 TABLET(500 MG) BY MOUTH TWICE DAILY WITH A MEAL 180 tablet 3  . methotrexate (RHEUMATREX) 15 MG tablet Take 1 tablet (15 mg total) by mouth once a week. Caution: Chemotherapy. Protect from light. 4 tablet 1  . promethazine (PHENERGAN) 25 MG tablet Take 1 tablet (25 mg total) by mouth every 8 (eight) hours as needed for nausea or vomiting. 20 tablet 0  . traZODone (DESYREL) 50 MG tablet TAKE 1 TABLET BY MOUTH EVERYDAY AT BEDTIME 90 tablet 0  . Vitamin D, Ergocalciferol, (DRISDOL) 1.25 MG  (50000 UT) CAPS capsule TAKE 1 CAPSULE (50,000 UNITS TOTAL) BY MOUTH EVERY 7 (SEVEN) DAYS. 12 capsule 0  . AMBULATORY NON FORMULARY MEDICATION Shingles vaccine x 2 doses Z23 (Patient not taking: Reported on 11/12/2019) 2 Doses/Fill 0  . nitroGLYCERIN (NITROSTAT) 0.4 MG SL tablet Place 1 tablet (0.4 mg total) under the tongue every 5 (five) minutes as needed for chest pain. (Patient not taking: Reported on 11/12/2019) 10 tablet 0   No current facility-administered medications on file prior to visit.     Review of Systems  Constitutional: Negative for activity change, appetite change and fatigue.  HENT: Negative.   Eyes: Negative.   Respiratory: Negative.   Cardiovascular: Negative.   Genitourinary: Negative for dysuria, frequency, pelvic pain and urgency.  Musculoskeletal: Positive for neck pain (chronic).  Skin: Negative.   Psychiatric/Behavioral: Positive for sleep disturbance (cannot stay asleep). Negative for agitation, behavioral problems, confusion and hallucinations.       Objective:   Physical Exam Constitutional:      Appearance: Normal appearance.  HENT:     Head: Normocephalic and atraumatic.  Cardiovascular:     Rate and Rhythm: Normal rate and regular rhythm.     Pulses: Normal pulses.  Pulmonary:     Breath sounds: Decreased air movement present. Wheezing and rhonchi present.  Skin:    General: Skin is warm and dry.  Neurological:     Mental Status: She is alert and oriented to person, place, and time.     Motor: No weakness.  Psychiatric:        Attention and Perception: Attention normal.        Mood and Affect: Mood normal.        Speech: Speech normal.        Behavior: Behavior normal. Behavior is not agitated, slowed, aggressive or combative.        Thought Content: Thought content normal.        Cognition and Memory: Cognition normal. Memory is impaired. She exhibits impaired recent memory. She does not exhibit impaired remote memory.        Judgment:  Judgment normal.   .. MMSE - Mini Mental State Exam 11/12/2019 09/12/2019 07/14/2018  Orientation to time 5 4 5   Orientation to Place 5 5 5   Registration 3 3 3   Attention/ Calculation 5 4 3   Recall 3 3 3   Language- name 2 objects 2 2 2   Language- repeat 1 1 1   Language- follow 3 step command 3 3 3   Language- read & follow direction 1 1 1   Write a sentence 1 1 1   Copy design 1 1 1   Total score 30 28 28  Assessment & Plan:  1. Memory changes MRI done this year with no acute findings.  - Ongoing, pt reports it is worsening. No red flag symptoms to warrant new imaging or referral at this time. I do think depression/anxiety/medications could also be effecting memory.  -MMSE is perfect.  - B12, Sed rate, CBC, TSH, Folate ordered.  - Will start Aricept. Discussed side effects.   2. HTN - Patient denies taking medications today. States she does usually take them in the afternoon.  - BP elevated at 177/93. Rechecked at 162/95. Denies any HA, palpitations, dizziness.  - Discussed BP checks at [facility]/pharmacy or with home cuff if available.  -bring log to next visit. She reports readings better at home in the 120's over 80s.   Discussed returning in 6 weeks or sooner if needed.   Spent with patient.   Marland KitchenHarlon Flor PA-C, have reviewed and agree with the above documentation in it's entirety.

## 2019-11-12 NOTE — Progress Notes (Deleted)
Patient ID: [name], female   DOB: [birth year], 69 y.o.   MRN: [id number] .[name] Depression screen [facility] 2/9 05/11/2019 09/19/2018 01/25/2018 03/09/2017  Decreased Interest 1 0 0 0  Down, Depressed, Hopeless 1 0 0 1  PHQ - 2 Score 2 0 0 1  Altered sleeping 1 - - -  Tired, decreased energy 2 - - -  Change in appetite 2 - - -  Feeling bad or failure about yourself  2 - - -  Trouble concentrating 2 - - -  Moving slowly or fidgety/restless 0 - - -  Suicidal thoughts 2 - - -  PHQ-9 Score 13 - - -  Difficult doing work/chores Somewhat difficult - - -  Some recent data might be hidden

## 2019-11-12 NOTE — Patient Instructions (Signed)
Start aricept 5mg  daily in morning.  Follow up in 6 weeks.

## 2019-11-13 ENCOUNTER — Encounter: Payer: Self-pay | Admitting: Physician Assistant

## 2019-11-13 MED ORDER — LISINOPRIL 2.5 MG PO TABS: 2.5000 mg | ORAL_TABLET | Freq: Every day | ORAL | 1 refills | Status: DC

## 2019-11-16 ENCOUNTER — Telehealth: Payer: Self-pay

## 2019-11-16 ENCOUNTER — Other Ambulatory Visit: Payer: Self-pay

## 2019-11-16 DIAGNOSIS — M5136 Other intervertebral disc degeneration, lumbar region: Secondary | ICD-10-CM

## 2019-11-16 NOTE — Telephone Encounter (Signed)
[name] called requesting an MRI for leg pain.   States increased pain.

## 2019-11-20 NOTE — Telephone Encounter (Signed)
MRI ordered

## 2019-11-20 NOTE — Telephone Encounter (Signed)
Ms. Eades has been advised that MRI order was placed. MRI will contact her to schedule once insurance approval has been received.

## 2019-11-20 NOTE — Addendum Note (Signed)
Addended byJomarie Longs on: 11/20/2019 12:07 PM   Modules accepted: Orders

## 2019-11-26 ENCOUNTER — Telehealth: Payer: Self-pay | Admitting: Neurology

## 2019-11-26 NOTE — Telephone Encounter (Signed)
Patient left vm asking for lab order for thyroid testing. It looks like this has already been ordered, patient just hasn't had it done. Called patient and made her aware to go to the lab.

## 2019-11-28 LAB — CBC
HCT: 45.2 % — ABNORMAL HIGH (ref 35.0–45.0)
Hemoglobin: 15.1 g/dL (ref 11.7–15.5)
MCH: 31.6 pg (ref 27.0–33.0)
MCHC: 33.4 g/dL (ref 32.0–36.0)
MCV: 94.6 fL (ref 80.0–100.0)
MPV: 10.9 fL (ref 7.5–12.5)
Platelets: 292 10*3/uL (ref 140–400)
RBC: 4.78 10*6/uL (ref 3.80–5.10)
RDW: 12.3 % (ref 11.0–15.0)
WBC: 10.8 10*3/uL (ref 3.8–10.8)

## 2019-11-28 LAB — FOLATE: Folate: 11.4 ng/mL

## 2019-11-28 LAB — VITAMIN B12: Vitamin B-12: 589 pg/mL (ref 200–1100)

## 2019-11-28 LAB — RPR: RPR Ser Ql: NONREACTIVE

## 2019-11-28 LAB — TSH: TSH: 3.61 mIU/L (ref 0.40–4.50)

## 2019-11-28 LAB — SEDIMENTATION RATE: Sed Rate: 11 mm/h (ref 0–30)

## 2019-11-28 NOTE — Progress Notes (Signed)
Labs actually look really good. No extensive inflammation. B12 great. Thyroid MUCH better. No anemia.   Is memory getting better?

## 2019-11-28 NOTE — Progress Notes (Signed)
Absolutely no lab cause for fatigue. If your breathing is worsening it could be contributing and also your pain medications could be sedating.

## 2019-12-02 ENCOUNTER — Other Ambulatory Visit: Payer: Self-pay

## 2019-12-02 ENCOUNTER — Ambulatory Visit (INDEPENDENT_AMBULATORY_CARE_PROVIDER_SITE_OTHER): Payer: Medicare Other

## 2019-12-02 ENCOUNTER — Other Ambulatory Visit: Payer: Medicare Other

## 2019-12-02 DIAGNOSIS — M5136 Other intervertebral disc degeneration, lumbar region: Secondary | ICD-10-CM

## 2019-12-04 NOTE — Telephone Encounter (Signed)
Her L3-4 disc unchanged and canal open.  Her L5-S1 appears unchanged.  She does have some new L2-L3 disc arthropathy since last MRI.   I would like for you to see Dr. Karie Schwalbe here in office or another specialist to get the best plan for you.

## 2019-12-07 ENCOUNTER — Telehealth: Payer: Self-pay | Admitting: Neurology

## 2019-12-07 NOTE — Telephone Encounter (Signed)
Patient called back about results of scan:  From [name]: Her L3-4 disc unchanged and canal open.  Her L5-S1 appears unchanged.  She does have some new L2-L3 disc arthropathy since last MRI.   I would like for you to see Dr. Karie Schwalbe here in office or another specialist to get the best plan for you.    She wanted to know next steps. Let her know she needs to see Dr. Karie Schwalbe to discuss. She was okay with that. Transferred to the front desk to schedule.

## 2019-12-12 ENCOUNTER — Ambulatory Visit: Payer: Medicare Other | Admitting: Sports Medicine

## 2019-12-14 ENCOUNTER — Telehealth: Payer: Self-pay | Admitting: *Deleted

## 2019-12-14 NOTE — Telephone Encounter (Signed)
Pt called inquiring about a message that she had received and she was having a hard time understanding the message and I told her that the last message I saw was from her scan and she remembered that then and she said she was going to try to see if she could figure out if the message was about something different.Kairee Isa Zimmerman Rumple, CMA

## 2019-12-17 ENCOUNTER — Ambulatory Visit (INDEPENDENT_AMBULATORY_CARE_PROVIDER_SITE_OTHER): Payer: Medicare Other | Admitting: Physician Assistant

## 2019-12-17 ENCOUNTER — Other Ambulatory Visit: Payer: Self-pay

## 2019-12-17 VITALS — BP 135/81 | HR 96 | Ht 63.0 in | Wt 138.0 lb

## 2019-12-17 DIAGNOSIS — M542 Cervicalgia: Secondary | ICD-10-CM

## 2019-12-17 DIAGNOSIS — E039 Hypothyroidism, unspecified: Secondary | ICD-10-CM

## 2019-12-17 DIAGNOSIS — F5101 Primary insomnia: Secondary | ICD-10-CM | POA: Diagnosis not present

## 2019-12-17 MED ORDER — TIZANIDINE HCL 4 MG PO TABS: 4.0000 mg | ORAL_TABLET | Freq: Four times a day (QID) | ORAL | 0 refills | Status: DC | PRN

## 2019-12-17 MED ORDER — LEVOTHYROXINE SODIUM 150 MCG PO TABS: 150.0000 ug | ORAL_TABLET | Freq: Every day | ORAL | 0 refills | Status: DC

## 2019-12-17 MED ORDER — TRAZODONE HCL 100 MG PO TABS: 100.0000 mg | ORAL_TABLET | Freq: Every day | ORAL | 1 refills | Status: DC

## 2019-12-17 NOTE — Patient Instructions (Signed)

## 2019-12-17 NOTE — Progress Notes (Signed)
Subjective:    Patient ID: [name], female    DOB: 28-Dec-1950, 69 y.o.   MRN: [id number]  HPI  Pt is a 69 yo osteoporotic female with chronic pain due to osteoarthritis and rheumatoid arthritis who presents today with neck pain.   For that past week or so her neck has really been bothering her. She c/o right sided neck pain worse than left. She is having a lot of stiffiness. No injury. No radiation of pain down arms. No strength changes. She thinks "she could have slept wrong". It is getting some better today. Not really doing anything for it.   She needs refills of thyroid medication. Last checked 2 weeks ago and in normal range.   She needs refill of trazodone for sleep request an increase in dose because she is using 1 and 1/2 tablet to 2 tablets a night. That does is working well.   .. Active Ambulatory Problems    Diagnosis Date Noted  . Mixed hyperlipidemia 09/28/2007  . HYPERCALCEMIA 11/13/2009  . MDD (major depressive disorder), recurrent episode, mild (HCC) 09/28/2007  . Chronic pain syndrome 06/05/2007  . FATTY LIVER DISEASE 09/28/2007  . ARTHRITIS, ACROMIOCLAVICULAR 07/31/2008  . SPINAL STENOSIS IN CERVICAL REGION 07/31/2008  . OSTEOPOROSIS 10/17/2007  . Elevated liver function tests 09/28/2007  . Allergic rhinitis 10/06/2010  . Chronic headache 04/03/2011  . Tobacco use 04/03/2011  . Chronic pain 04/03/2011  . Benign hypertension 04/03/2011  . Sacroiliitis (HCC) 09/14/2011  . Diabetes type 2, controlled (HCC) 03/14/2013  . Hypothyroidism 03/14/2013  . Trochanteric bursitis of right hip 05/04/2013  . Anxiety state 05/16/2013  . Ganglion cyst of wrist 07/13/2013  . Acute pain of left shoulder 09/23/2013  . COLD (chronic obstructive lung disease) (HCC) 12/06/2013  . Hyperlipidemia 01/22/2015  . 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 Myocardial infarct (HCC) 03/09/2017  . CAD (coronary artery disease)  03/09/2017  . Rotator cuff tendonitis, left 03/10/2017  . Anxiety 03/12/2017  . Left upper arm swelling secondary to rotator cuff tear and fluid tracking down biceps tendon sheath 04/14/2017  . Common bile duct dilatation 04/27/2017  . Lumbar degenerative disc disease 05/13/2017  . Abnormal finding on GI tract imaging 05/25/2017  . Chronic back pain 02/27/2016  . Atherosclerosis of native coronary artery of native heart without angina pectoris 03/16/2016  . RA (rheumatoid arthritis) (HCC) 07/06/2016  . Acquired hypothyroidism 12/01/2015  . Moderate episode of recurrent major depressive disorder (HCC) 07/24/2017  . Bilateral calf pain 07/24/2017  . Memory changes 07/24/2017  . Bilateral cataracts 09/07/2017  . Dry eye syndrome of bilateral lacrimal glands 09/07/2017  . Positive colorectal cancer screening using Cologuard test 10/07/2017  . Microalbuminuria 10/07/2017  . Weakness of both lower extremities 10/07/2017  . History of diverticulitis 11/21/2017  . Non-intractable vomiting with nausea 11/21/2017  . Left lower quadrant guarding 11/21/2017  . Generalized abdominal pain 11/21/2017  . Vasovagal syncope 12/16/2017  . Vitamin D deficiency 04/05/2018  . Primary insomnia 04/05/2018  . Hiatal hernia 04/05/2018  . Primary osteoarthritis 07/14/2018  . Gastroesophageal reflux disease with esophagitis 07/14/2018  . Fixed pupil of both eyes 07/18/2018  . Frequent headaches 07/18/2018  . Unsteady gait 07/18/2018  . Fatigue 12/11/2018  . Excessive sleepiness 05/15/2019  . No appetite 05/15/2019  . SIRS (systemic inflammatory response syndrome) (HCC) 06/04/2019  . Sepsis (HCC) 06/04/2019  . Bilateral hand swelling 07/16/2019  . Bilateral hand pain 07/16/2019  . Arthralgia of  lower leg 10/22/2019  . Left lumbar radiculopathy 11/05/2019  . Neck pain 12/18/2019   Resolved Ambulatory Problems    Diagnosis Date Noted  . DM (diabetes mellitus) (HCC) 06/05/2007  . HEMORRHOIDS, WITH  BLEEDING 04/22/2008  . Acute maxillary sinusitis 04/10/2010  . Alcoholic fatty liver 12/24/2009  . RENAL INSUFFICIENCY, ACUTE 10/23/2008  . ROTATOR CUFF SYNDROME, RIGHT 04/01/2008  . ALLERGIC REACTION 12/24/2009  . ELEVATED BLOOD PRESSURE WITHOUT DIAGNOSIS OF HYPERTENSION 05/13/2010  . CHRONIC OBSTRUCTIVE PULMONARY DISEASE, ACUTE EXACERBATION 08/14/2010  . Recurrent acute sinusitis 09/09/2010  . Allergic dermatitis due to poison ivy 11/27/2010  . DERMATITIS, ATOPIC 01/15/2011  . Arthritis of carpometacarpal joint 02/14/2012  . Ganglion cyst of left foot 07/13/2013  . Chronic pain syndrome 12/05/2013  . Primary osteoarthritis of left wrist 12/10/2014  . Rheumatoid arthritis involving both hands (HCC) 03/09/2017  . Bulging lumbar disc 02/27/2016  . Pyelonephritis 11/21/2017  . Upper abdominal pain 03/10/2018  . Patellar sleeve fracture, right, closed, initial encounter 04/24/2018   Past Medical History:  Diagnosis Date  . Cervicalgia   . COPD (chronic obstructive pulmonary disease) (HCC)   . DDD (degenerative disc disease), lumbar   . Diabetes mellitus   . Fatty liver   . Narcotic abuse (HCC)   . Osteoarthritis   . Osteoporosis   . Rheumatoid arthritis (HCC)   . Thyroid disease        Review of Systems  All other systems reviewed and are negative.      Objective:   Physical Exam Vitals reviewed.  Constitutional:      Appearance: Normal appearance.  Cardiovascular:     Rate and Rhythm: Normal rate and regular rhythm.     Pulses: Normal pulses.  Pulmonary:     Effort: Pulmonary effort is normal.  Musculoskeletal:     Comments: Decreased ROM due to neck pain to the left right and up and down.  No pain to palpation over cervical spine.  Cervical paraspinal tenderness to palpation.  Bilateral upper extremity strength.  ROM of bilateral shoulders normal.   Neurological:     General: No focal deficit present.     Mental Status: She is alert and oriented to person,  place, and time.  Psychiatric:        Mood and Affect: Mood normal.           Assessment & Plan:  Marland KitchenMarland KitchenDiane was seen today for neck pain.  Diagnoses and all orders for this visit:  Neck pain -     tiZANidine (ZANAFLEX) 4 MG tablet; Take 1 tablet (4 mg total) by mouth every 6 (six) hours as needed for muscle spasms.  Acquired hypothyroidism -     levothyroxine (SYNTHROID) 150 MCG tablet; Take 1 tablet (150 mcg total) by mouth daily.  Primary insomnia -     traZODone (DESYREL) 100 MG tablet; Take 1 tablet (100 mg total) by mouth at bedtime.   Likely due to pts hx some arthritis in neck. Discussed no red flag signs. Gave HO with stretches. Encouraged icy hot, heat, tens unit, massage, diclofenac gel and gave muscle relaxear to use as needed. If any worsening pain or radiation call office for further evaluation.

## 2019-12-18 ENCOUNTER — Other Ambulatory Visit: Payer: Self-pay | Admitting: Medical-Surgical

## 2019-12-18 ENCOUNTER — Encounter: Payer: Self-pay | Admitting: Physician Assistant

## 2019-12-18 ENCOUNTER — Ambulatory Visit: Payer: Medicare Other | Admitting: Physician Assistant

## 2019-12-18 DIAGNOSIS — M542 Cervicalgia: Secondary | ICD-10-CM | POA: Insufficient documentation

## 2019-12-18 DIAGNOSIS — M79605 Pain in left leg: Secondary | ICD-10-CM

## 2019-12-31 ENCOUNTER — Other Ambulatory Visit: Payer: Self-pay | Admitting: Physician Assistant

## 2019-12-31 DIAGNOSIS — E039 Hypothyroidism, unspecified: Secondary | ICD-10-CM

## 2020-01-01 ENCOUNTER — Telehealth (INDEPENDENT_AMBULATORY_CARE_PROVIDER_SITE_OTHER): Payer: Medicare Other | Admitting: Physician Assistant

## 2020-01-01 ENCOUNTER — Encounter: Payer: Self-pay | Admitting: Physician Assistant

## 2020-01-01 DIAGNOSIS — Z7189 Other specified counseling: Secondary | ICD-10-CM

## 2020-01-01 DIAGNOSIS — F331 Major depressive disorder, recurrent, moderate: Secondary | ICD-10-CM | POA: Diagnosis not present

## 2020-01-01 DIAGNOSIS — F419 Anxiety disorder, unspecified: Secondary | ICD-10-CM

## 2020-01-01 DIAGNOSIS — Z7185 Encounter for immunization safety counseling: Secondary | ICD-10-CM

## 2020-01-01 MED ORDER — ALPRAZOLAM 1 MG PO TABS: ORAL_TABLET | ORAL | 5 refills | Status: DC

## 2020-01-01 NOTE — Progress Notes (Signed)
Patient ID: [name], female   DOB: 07/20/1950, 69 y.o.   MRN: [id number] .Marland KitchenVirtual Visit via Telephone Note  I connected with [name] on 01/01/2020 at  1:20 PM EDT by telephone and verified that I am speaking with the correct person using two identifiers.  Location: Patient: home Provider: clinic   I discussed the limitations, risks, security and privacy concerns of performing an evaluation and management service by telephone and the availability of in person appointments. I also discussed with the patient that there may be a patient responsible charge related to this service. The patient expressed understanding and agreed to proceed.   History of Present Illness: Pt is a 69 yo female with COPD, CAD, GERD, T2DM, chronic pain syndrome, RA, MDD, GAD who presents to the clinic with some questions.   She has had her covid vaccine and wants to know if time to get a booster.   She also is on   .[name] Active Ambulatory Problems    Diagnosis Date Noted  . Mixed hyperlipidemia 09/28/2007  . HYPERCALCEMIA 11/13/2009  . MDD (major depressive disorder), recurrent episode, mild (HCC) 09/28/2007  . Chronic pain syndrome 06/05/2007  . FATTY LIVER DISEASE 09/28/2007  . ARTHRITIS, ACROMIOCLAVICULAR 07/31/2008  . SPINAL STENOSIS IN CERVICAL REGION 07/31/2008  . OSTEOPOROSIS 10/17/2007  . Elevated liver function tests 09/28/2007  . Allergic rhinitis 10/06/2010  . Chronic headache 04/03/2011  . Tobacco use 04/03/2011  . Chronic pain 04/03/2011  . Benign hypertension 04/03/2011  . Sacroiliitis (HCC) 09/14/2011  . Diabetes type 2, controlled (HCC) 03/14/2013  . Hypothyroidism 03/14/2013  . Trochanteric bursitis of right hip 05/04/2013  . Anxiety state 05/16/2013  . Ganglion cyst of wrist 07/13/2013  . Acute pain of left shoulder 09/23/2013  . COLD (chronic obstructive lung disease) (HCC) 12/06/2013  . Hyperlipidemia 01/22/2015  . COPD (chronic obstructive pulmonary disease) with chronic  bronchitis (HCC) 03/05/2015  . Myocardial infarct (HCC) 03/09/2017  . CAD (coronary artery disease) 03/09/2017  . Rotator cuff tendonitis, left 03/10/2017  . Anxiety 03/12/2017  . Left upper arm swelling secondary to rotator cuff tear and fluid tracking down biceps tendon sheath 04/14/2017  . Common bile duct dilatation 04/27/2017  . Lumbar degenerative disc disease 05/13/2017  . Abnormal finding on GI tract imaging 05/25/2017  . Chronic back pain 02/27/2016  . Atherosclerosis of native coronary artery of native heart without angina pectoris 03/16/2016  . RA (rheumatoid arthritis) (HCC) 07/06/2016  . Acquired hypothyroidism 12/01/2015  . Moderate episode of recurrent major depressive disorder (HCC) 07/24/2017  . Bilateral calf pain 07/24/2017  . Memory changes 07/24/2017  . Bilateral cataracts 09/07/2017  . Dry eye syndrome of bilateral lacrimal glands 09/07/2017  . Positive colorectal cancer screening using Cologuard test 10/07/2017  . Microalbuminuria 10/07/2017  . Weakness of both lower extremities 10/07/2017  . History of diverticulitis 11/21/2017  . Non-intractable vomiting with nausea 11/21/2017  . Left lower quadrant guarding 11/21/2017  . Generalized abdominal pain 11/21/2017  . Vasovagal syncope 12/16/2017  . Vitamin D deficiency 04/05/2018  . Primary insomnia 04/05/2018  . Hiatal hernia 04/05/2018  . Primary osteoarthritis 07/14/2018  . Gastroesophageal reflux disease with esophagitis 07/14/2018  . Fixed pupil of both eyes 07/18/2018  . Frequent headaches 07/18/2018  . Unsteady gait 07/18/2018  . Fatigue 12/11/2018  . Excessive sleepiness 05/15/2019  . No appetite 05/15/2019  . SIRS (systemic inflammatory response syndrome) (HCC) 06/04/2019  . Sepsis (HCC) 06/04/2019  . Bilateral hand swelling 07/16/2019  . Bilateral  hand pain 07/16/2019  . Arthralgia of lower leg 10/22/2019  . Left lumbar radiculopathy 11/05/2019  . Neck pain 12/18/2019   Resolved Ambulatory  Problems    Diagnosis Date Noted  . DM (diabetes mellitus) (HCC) 06/05/2007  . HEMORRHOIDS, WITH BLEEDING 04/22/2008  . Acute maxillary sinusitis 04/10/2010  . Alcoholic fatty liver 12/24/2009  . RENAL INSUFFICIENCY, ACUTE 10/23/2008  . ROTATOR CUFF SYNDROME, RIGHT 04/01/2008  . ALLERGIC REACTION 12/24/2009  . ELEVATED BLOOD PRESSURE WITHOUT DIAGNOSIS OF HYPERTENSION 05/13/2010  . CHRONIC OBSTRUCTIVE PULMONARY DISEASE, ACUTE EXACERBATION 08/14/2010  . Recurrent acute sinusitis 09/09/2010  . Allergic dermatitis due to poison ivy 11/27/2010  . DERMATITIS, ATOPIC 01/15/2011  . Arthritis of carpometacarpal joint 02/14/2012  . Ganglion cyst of left foot 07/13/2013  . Chronic pain syndrome 12/05/2013  . Primary osteoarthritis of left wrist 12/10/2014  . Rheumatoid arthritis involving both hands (HCC) 03/09/2017  . Bulging lumbar disc 02/27/2016  . Pyelonephritis 11/21/2017  . Upper abdominal pain 03/10/2018  . Patellar sleeve fracture, right, closed, initial encounter 04/24/2018   Past Medical History:  Diagnosis Date  . Cervicalgia   . COPD (chronic obstructive pulmonary disease) (HCC)   . DDD (degenerative disc disease), lumbar   . Diabetes mellitus   . Fatty liver   . Narcotic abuse (HCC)   . Osteoarthritis   . Osteoporosis   . Rheumatoid arthritis (HCC)   . Thyroid disease    Reviewed med, allergy, problem list.     Observations/Objective: No acute distress.  Normal mood.  Normal breathing.   .. Today's Vitals   There is no height or weight on file to calculate BMI.    Assessment and Plan: Marland KitchenMarland KitchenDiane was seen today for discuss possible medication changes.  Diagnoses and all orders for this visit:  Vaccine counseling  Moderate episode of recurrent major depressive disorder (HCC)  Anxiety -     ALPRAZolam (XANAX) 1 MG tablet; Take one and one-half tablet tablet at bedtime.   Discussed right now to [facility] update about booster with vaccine. Hold for now on any  boosters.   Discussed not to take any xanax with alcohol or opiods. She is tapering off suboxone right now. Discussed dependency risk. Pt is only taking in late evening. She used to be on 1.5mg  at night. She wishes to go back on this dose. Continue on pristiq and wellbutrin for mood control. Sent new directions.    Follow Up Instructions:    I discussed the assessment and treatment plan with the patient. The patient was provided an opportunity to ask questions and all were answered. The patient agreed with the plan and demonstrated an understanding of the instructions.   The patient was advised to call back or seek an in-person evaluation if the symptoms worsen or if the condition fails to improve as anticipated.  I provided 10 minutes of non-face-to-face time during this encounter.   Tandy Gaw, PA-C

## 2020-01-02 ENCOUNTER — Encounter: Payer: Self-pay | Admitting: Physician Assistant

## 2020-01-04 ENCOUNTER — Telehealth: Payer: Self-pay | Admitting: Neurology

## 2020-01-04 MED ORDER — PREDNISONE 20 MG PO TABS
20.0000 mg | ORAL_TABLET | Freq: Every day | ORAL | 0 refills | Status: DC
Start: 2020-01-04 — End: 2020-03-21

## 2020-01-04 NOTE — Telephone Encounter (Signed)
LMOM letting patient know medication sent to pharmacy, if no better after meds to make a follow up with our office.

## 2020-01-04 NOTE — Telephone Encounter (Signed)
Sent one tablet for 5 days.

## 2020-01-04 NOTE — Telephone Encounter (Signed)
Patient left vm that her hands are swollen. She is asking for prednisone. Does she need an appt?

## 2020-01-07 ENCOUNTER — Telehealth: Payer: Self-pay | Admitting: Neurology

## 2020-01-07 NOTE — Telephone Encounter (Signed)
Patient left vm stating still having extreme fatigue. Sleeping 16-17 hours a day. She asked about B12 injections.   From labs in June/July:  [name], [facility]  11/30/2019 10:30 AM EDT Back to Top    Patient advised of recommendations.    [name], PA-C  11/28/2019 2:54 PM EDT     Absolutely no lab cause for fatigue. If your breathing is worsening it could be contributing and also your pain medications could be sedating.    Silvio Pate, CMA  11/28/2019 2:18 PM EDT     Spoke with patient and she was made aware of lab results. She states memory does seem better but still having extreme fatigue. She states she will sleep 12+ hours at night and still feel like she needs a nap. Let her know I would call back if any other suggestions from [place].

## 2020-01-08 ENCOUNTER — Other Ambulatory Visit: Payer: Self-pay | Admitting: Physician Assistant

## 2020-01-08 DIAGNOSIS — E039 Hypothyroidism, unspecified: Secondary | ICD-10-CM

## 2020-01-08 NOTE — Telephone Encounter (Signed)
LMOM making Patient aware B12 injections not warranted due to normal labs. Should follow up on pain medication if would like to address.

## 2020-01-28 ENCOUNTER — Other Ambulatory Visit: Payer: Self-pay | Admitting: Neurology

## 2020-01-28 DIAGNOSIS — E039 Hypothyroidism, unspecified: Secondary | ICD-10-CM

## 2020-01-28 MED ORDER — LEVOTHYROXINE SODIUM 150 MCG PO TABS: 150.0000 ug | ORAL_TABLET | Freq: Every day | ORAL | 0 refills | Status: DC

## 2020-02-06 ENCOUNTER — Ambulatory Visit: Payer: Medicare Other | Admitting: Physician Assistant

## 2020-02-08 ENCOUNTER — Encounter: Payer: Self-pay | Admitting: Nurse Practitioner

## 2020-02-08 ENCOUNTER — Ambulatory Visit: Payer: Medicare Other | Admitting: Medical-Surgical

## 2020-02-08 ENCOUNTER — Ambulatory Visit (INDEPENDENT_AMBULATORY_CARE_PROVIDER_SITE_OTHER): Payer: Medicare Other | Admitting: Nurse Practitioner

## 2020-02-08 VITALS — BP 179/91 | HR 107 | Ht 63.0 in | Wt 135.0 lb

## 2020-02-08 DIAGNOSIS — E039 Hypothyroidism, unspecified: Secondary | ICD-10-CM

## 2020-02-08 DIAGNOSIS — R5383 Other fatigue: Secondary | ICD-10-CM

## 2020-02-08 NOTE — Progress Notes (Signed)
Acute Office Visit  Subjective:    Patient ID: [name], female    DOB: Aug 13, 1950, 69 y.o.   MRN: [id number]  CC: Ongoing fatgiue  HPI  Patient reports that she has had ongoing fatigue for the past several months.  She reports that she is sleeping about 18 hours a day.  She said that she is interested in receiving B12 injections for this.    She denies depressed or anxious mood, changes in her eating habits, missed doses of her thyroid medicine, chest pain, shortness of breath, or decrease in mental status.  She denies any changes to her weight.  She states that she gets up at the same time every day but then goes back to bed.  She reports she does not have the energy to exercise or take walks.  She reports that she has stopped taking her trazodone at night and that she is not taking her Xanax.  She reports that she does intermittently take her gabapentin but that this does not increase her sleepiness.  She expresses frustration that her symptoms are ongoing and there is no known cause at this time.  Past Medical History:  Diagnosis Date  . CAD (coronary artery disease)   . Cervicalgia   . Chronic pain   . COPD (chronic obstructive pulmonary disease) (HCC)   . DDD (degenerative disc disease), lumbar   . Diabetes mellitus   . Fatty liver   . Hyperlipidemia   . Narcotic abuse (HCC)    history  . Osteoarthritis   . Osteoporosis   . Rheumatoid arthritis (HCC)   . Thyroid disease    hypothyroid    Past Surgical History:  Procedure Laterality Date  . BREAST BIOPSY    . BREAST EXCISIONAL BIOPSY    . BREAST SURGERY     braest lump/ benign  . CHOLECYSTECTOMY    . LUMBAR LAMINECTOMY  05/2010   Dr. Netta Corrigan  . TOTAL KNEE ARTHROPLASTY  2010   right    Family History  Problem Relation Age of Onset  . Heart disease Mother 50       MI  . Hyperlipidemia Mother   . Rheum arthritis Mother   . Lymphoma Mother   . Heart disease Father 70       MI  . Diabetes Father   . Rheum  arthritis Father   . Cancer Maternal Grandmother        hodgkins lympoma  . Cirrhosis Sister        primary biliary    Social History   Socioeconomic History  . Marital status: Married    Spouse name: Not on file  . Number of children: 2  . Years of education: Not on file  . Highest education level: Not on file  Occupational History  . Not on file  Tobacco Use  . Smoking status: Current Every Day Smoker    Packs/day: 1.00    Years: 43.00    Pack years: 43.00    Types: Cigarettes  . Smokeless tobacco: Never Used  . Tobacco comment: Has tried Chantix and Wellbutrin in the past  Substance and Sexual Activity  . Alcohol use: Yes    Alcohol/week: 5.0 standard drinks    Types: 5 Standard drinks or equivalent per week    Comment: 1 beer per day  . Drug use: No  . Sexual activity: Not on file  Other Topics Concern  . Not on file  Social History Narrative  . Not on  file   Social Determinants of Health   Financial Resource Strain:   . Difficulty of Paying Living Expenses: Not on file  Food Insecurity:   . Worried About Programme researcher, broadcasting/film/video in the Last Year: Not on file  . Ran Out of Food in the Last Year: Not on file  Transportation Needs:   . Lack of Transportation (Medical): Not on file  . Lack of Transportation (Non-Medical): Not on file  Physical Activity:   . Days of Exercise per Week: Not on file  . Minutes of Exercise per Session: Not on file  Stress:   . Feeling of Stress : Not on file  Social Connections:   . Frequency of Communication with Friends and Family: Not on file  . Frequency of Social Gatherings with Friends and Family: Not on file  . Attends Religious Services: Not on file  . Active Member of Clubs or Organizations: Not on file  . Attends Banker Meetings: Not on file  . Marital Status: Not on file  Intimate Partner Violence:   . Fear of Current or Ex-Partner: Not on file  . Emotionally Abused: Not on file  . Physically Abused: Not  on file  . Sexually Abused: Not on file    Outpatient Medications Prior to Visit  Medication Sig Dispense Refill  . alendronate (FOSAMAX) 70 MG tablet TAKE ONE TABLET EVERY 7 DAYS WITH A FULL GLASS OF WATER ON AN EMPTY STOMACH 4 tablet 11  . ALPRAZolam (XANAX) 1 MG tablet Take one and one-half tablet tablet at bedtime. 45 tablet 5  . aspirin EC 81 MG tablet Take 81 mg by mouth daily.     [name] atorvastatin (LIPITOR) 80 MG tablet TAKE ONE TABLET BY MOUTH EVERY DAY 90 tablet 2  . Buprenorphine HCl-Naloxone HCl 8-2 MG FILM PLACE TWO FILMS UNDER THE TONGUE EVERY DAY    . buPROPion (WELLBUTRIN XL) 150 MG 24 hr tablet TAKE 1 TABLET BY MOUTH EVERY DAY IN THE MORNING 90 tablet 3  . cetirizine (ZYRTEC) 10 MG tablet Take 10 mg by mouth daily.    [name] desvenlafaxine (PRISTIQ) 100 MG 24 hr tablet Take 1 tablet (100 mg total) by mouth daily. 90 tablet 3  . diclofenac sodium (VOLTAREN) 1 % GEL Apply 4 g topically 4 (four) times daily. To affected joint. 100 g 1  . famotidine (PEPCID) 20 MG tablet Take 1 tablet (20 mg total) by mouth 2 (two) times daily. 60 tablet 5  . hydroxychloroquine (PLAQUENIL) 200 MG tablet TK 1 T PO D    . levothyroxine (SYNTHROID) 150 MCG tablet Take 1 tablet (150 mcg total) by mouth daily. 90 tablet 0  . lisinopril (ZESTRIL) 2.5 MG tablet Take 1 tablet (2.5 mg total) by mouth daily. 90 tablet 1  . meclizine (ANTIVERT) 25 MG tablet Take 1 tablet (25 mg total) by mouth 3 (three) times daily as needed for dizziness. 30 tablet 0  . metFORMIN (GLUCOPHAGE) 500 MG tablet TAKE 1 TABLET(500 MG) BY MOUTH TWICE DAILY WITH A MEAL 180 tablet 3  . methotrexate (RHEUMATREX) 15 MG tablet Take 1 tablet (15 mg total) by mouth once a week. Caution: Chemotherapy. Protect from light. 4 tablet 1  . predniSONE (DELTASONE) 20 MG tablet Take 1 tablet (20 mg total) by mouth daily with breakfast. 5 tablet 0  . promethazine (PHENERGAN) 25 MG tablet Take 1 tablet (25 mg total) by mouth every 8 (eight) hours as needed  for nausea or vomiting. 20 tablet  0  . traMADol (ULTRAM) 50 MG tablet Take 50 mg by mouth every 8 (eight) hours as needed.    . traZODone (DESYREL) 100 MG tablet Take 1 tablet (100 mg total) by mouth at bedtime. 90 tablet 1  . Vitamin D, Ergocalciferol, (DRISDOL) 1.25 MG (50000 UT) CAPS capsule TAKE 1 CAPSULE (50,000 UNITS TOTAL) BY MOUTH EVERY 7 (SEVEN) DAYS. 12 capsule 0  . gabapentin (NEURONTIN) 600 MG tablet Take 1.5 tablets (900 mg total) by mouth 3 (three) times daily. (Patient taking differently: Take 600 mg by mouth 3 (three) times daily. ) 405 tablet 0   No facility-administered medications prior to visit.    Allergies  Allergen Reactions  . Codeine Palpitations and Other (See Comments)  . Naproxen Rash and Palpitations    GI upset  . Budesonide-Formoterol Fumarate Rash    REACTION: rash REACTION: rash REACTION: rash  . Duloxetine Hcl Other (See Comments)    No difference for pain or anti-depressant.  Other reaction(s): Other No difference for pain or anti-depressant. No difference for pain or anti-depressant.  . Fentanyl Other (See Comments)    REACTION: severe aggitation, mood changes Other reaction(s): Hallucination, Psychiatric Hallucinations Hallucinations REACTION: severe aggitation, mood changes   . Tramadol-Acetaminophen     Other reaction(s): Unknown  . Effexor Xr [Venlafaxine Hcl Er]     Felt more anxious or no difference.   . Fluticasone-Salmeterol   . Lisinopril Rash    Maybe we dont really know      Objective:    Physical Exam Vitals and nursing note reviewed.  Constitutional:      Appearance: Normal appearance.  Eyes:     Extraocular Movements: Extraocular movements intact.     Conjunctiva/sclera: Conjunctivae normal.     Pupils: Pupils are equal, round, and reactive to light.  Cardiovascular:     Rate and Rhythm: Normal rate and regular rhythm.     Pulses: Normal pulses.  Pulmonary:     Effort: Pulmonary effort is normal.   Musculoskeletal:        General: Normal range of motion.     Right lower leg: No edema.     Left lower leg: No edema.  Skin:    General: Skin is warm and dry.     Capillary Refill: Capillary refill takes less than 2 seconds.  Neurological:     General: No focal deficit present.     Mental Status: She is alert and oriented to person, place, and time.  Psychiatric:        Mood and Affect: Affect is blunt.        Speech: Speech normal.        Behavior: Behavior is agitated.        Thought Content: Thought content normal.        Cognition and Memory: Cognition and memory normal.        Judgment: Judgment normal.     BP (!) 179/91   Pulse (!) 107   Ht 5\' 3"  (1.6 m)   Wt 135 lb (61.2 kg)   SpO2 92%   BMI 23.91 kg/m  Wt Readings from Last 3 Encounters:  02/08/20 135 lb (61.2 kg)  12/17/19 138 lb (62.6 kg)  11/12/19 138 lb (62.6 kg)    Health Maintenance Due  Topic Date Due  . OPHTHALMOLOGY EXAM  09/03/2018  . HEMOGLOBIN A1C  09/12/2019  . INFLUENZA VACCINE  12/30/2019    There are no preventive care reminders to display for this patient.  Lab Results  Component Value Date   TSH 3.61 11/27/2019   Lab Results  Component Value Date   WBC 10.8 11/27/2019   HGB 15.1 11/27/2019   HCT 45.2 (H) 11/27/2019   MCV 94.6 11/27/2019   PLT 292 11/27/2019   Lab Results  Component Value Date   NA 136 07/17/2019   K 3.7 07/17/2019   CO2 28 07/17/2019   GLUCOSE 175 (H) 07/17/2019   BUN 16 07/17/2019   CREATININE 0.74 07/17/2019   BILITOT 0.3 07/17/2019   ALKPHOS 193 (H) 12/10/2014   AST 10 07/17/2019   ALT 8 07/17/2019   PROT 6.8 07/17/2019   ALBUMIN 4.1 12/10/2014   CALCIUM 9.9 07/17/2019   Lab Results  Component Value Date   CHOL 186 12/13/2018   Lab Results  Component Value Date   HDL 83 12/13/2018   Lab Results  Component Value Date   LDLCALC 79 12/13/2018   Lab Results  Component Value Date   TRIG 138 12/13/2018   Lab Results  Component Value Date    CHOLHDL 2.2 12/13/2018   Lab Results  Component Value Date   HGBA1C 6.2 (A) 03/14/2019       Assessment & Plan:   Problem List Items Addressed This Visit      Endocrine   Hypothyroidism    We will plan to recheck TSH today in the setting of severe ongoing fatigue. This was checked approximately 2 months ago and found to be within normal limits however given the severity of her symptoms it warrants further evaluation.        Other   Fatigue - Primary    Ongoing fatigue of unknown etiology.  Does appear that she has several sedating medications however she reports that she is not taking many of these. Chart review reveals that this has been an ongoing problem for some time now.  Her PCP collected labs approximately 2 months ago to monitor B12 levels, CBC, folate, TSH, sedimentation rate, RPR-all of which were found to be within normal limits. I do have concerns that some of her symptoms could be possibly depressive symptoms however she is currently on medications for this. I do sense a high level of frustration with the ongoing fatigue. Discussed with the patient that her most recent labs for B12 were normal and it would not be appropriate to provide B12 supplementation for normal levels. Recommend a routine bedtime and routine wake-up time, regular physical activity during the day, increase dietary protein, avoidance of sedating medications, and avoiding smoking. We will recheck TSH today to make sure thyroid levels are still within normal limits.      Relevant Orders   Thyroid Panel With TSH     Will follow up with patient based on lab results.  Consider sleep study if symptoms continue.   Tollie Eth, NP

## 2020-02-08 NOTE — Patient Instructions (Signed)

## 2020-02-08 NOTE — Assessment & Plan Note (Addendum)
Ongoing fatigue of unknown etiology.  Does appear that she has several sedating medications however she reports that she is not taking many of these. Chart review reveals that this has been an ongoing problem for some time now.  Her PCP collected labs approximately 2 months ago to monitor B12 levels, CBC, folate, TSH, sedimentation rate, RPR-all of which were found to be within normal limits. I do have concerns that some of her symptoms could be possibly depressive symptoms however she is currently on medications for this. I do sense a high level of frustration with the ongoing fatigue. Discussed with the patient that her most recent labs for B12 were normal and it would not be appropriate to provide B12 supplementation for normal levels. Recommend a routine bedtime and routine wake-up time, regular physical activity during the day, increase dietary protein, avoidance of sedating medications, and avoiding smoking. We will recheck TSH today to make sure thyroid levels are still within normal limits. It is unclear at this time if she has been evaluated for sleep apnea which is highly possible in the setting of obstructive lung disease and a history of CVD.  Consider referral for sleep study if TSH levels are found to be within the normal limits.

## 2020-02-08 NOTE — Assessment & Plan Note (Signed)
We will plan to recheck TSH today in the setting of severe ongoing fatigue. This was checked approximately 2 months ago and found to be within normal limits however given the severity of her symptoms it warrants further evaluation.

## 2020-02-13 DIAGNOSIS — R5383 Other fatigue: Secondary | ICD-10-CM | POA: Diagnosis not present

## 2020-02-14 LAB — THYROID PANEL WITH TSH
Free Thyroxine Index: 0.9 — ABNORMAL LOW (ref 1.4–3.8)
T3 Uptake: 23 % (ref 22–35)
T4, Total: 3.7 ug/dL — ABNORMAL LOW (ref 5.1–11.9)
TSH: 50.86 mIU/L — ABNORMAL HIGH (ref 0.40–4.50)

## 2020-02-14 NOTE — Progress Notes (Signed)
[name],   Your thyroid is most certainly off again, which is likely what is causing your severe fatigue. Are you taking your levothyroxine first thing in the morning on an empty stomach with no other medication?   If so,we need to make some changes to your dose. If you are not, this is the best way to take it to make sure that it is absorbing in your body correctly.

## 2020-02-15 ENCOUNTER — Other Ambulatory Visit: Payer: Self-pay | Admitting: Nurse Practitioner

## 2020-02-15 DIAGNOSIS — E039 Hypothyroidism, unspecified: Secondary | ICD-10-CM

## 2020-02-15 MED ORDER — LEVOTHYROXINE SODIUM 150 MCG PO TABS: ORAL_TABLET | ORAL | 0 refills | Status: DC

## 2020-02-15 NOTE — Progress Notes (Signed)
Change in regimen of levothyroxine dosing due to uncontrolled hypothyroidism. Change to 1 tab (150 mcg) by mouth every Sunday, Monday, Wednesday, Friday.  2 tabs (300 mcg) by mouth every Tuesday, Thursday, Saturday. Recheck labs in 8 weeks (mid November) orders have been placed.

## 2020-02-15 NOTE — Progress Notes (Signed)
I do not believe that one missed dose would make such a significant difference in the current levels.  I would like you to change the dosage of your levothyroxine to see if we can get some improvement on these numbers and help with your symptoms.  Sunday, Monday, Wednesday, Friday: 1 tab (150 mcg) of levothyroxine by mouth Tuesday, Thursday, Saturday: 2 tabs (300 mcg) of levothyroxine by mouth  We will plan to recheck your levels in 8 weeks.  I have placed the orders for just come into the lab to have this done.

## 2020-02-22 ENCOUNTER — Other Ambulatory Visit: Payer: Self-pay | Admitting: Neurology

## 2020-02-22 DIAGNOSIS — E039 Hypothyroidism, unspecified: Secondary | ICD-10-CM

## 2020-02-22 MED ORDER — LEVOTHYROXINE SODIUM 150 MCG PO TABS: ORAL_TABLET | ORAL | 0 refills | Status: DC

## 2020-02-22 NOTE — Progress Notes (Signed)
Patient states pharmacy didn't get Levothyroxine RX. Resent.

## 2020-02-26 ENCOUNTER — Other Ambulatory Visit: Payer: Self-pay | Admitting: Family Medicine

## 2020-02-26 DIAGNOSIS — M5136 Other intervertebral disc degeneration, lumbar region: Secondary | ICD-10-CM

## 2020-02-27 NOTE — Telephone Encounter (Signed)
Forwarding to PCP.

## 2020-03-21 ENCOUNTER — Ambulatory Visit (INDEPENDENT_AMBULATORY_CARE_PROVIDER_SITE_OTHER): Payer: Medicare Other | Admitting: Sports Medicine

## 2020-03-21 DIAGNOSIS — M542 Cervicalgia: Secondary | ICD-10-CM | POA: Diagnosis not present

## 2020-03-21 MED ORDER — MELOXICAM 15 MG PO TABS: ORAL_TABLET | ORAL | 3 refills | Status: DC

## 2020-03-21 MED ORDER — PREDNISONE 50 MG PO TABS: ORAL_TABLET | ORAL | 0 refills | Status: DC

## 2020-03-21 NOTE — Assessment & Plan Note (Signed)
This is a pleasant 69 year old female, she has long history of thoracic and lumbar DDD and other degenerative processes, more recently she is had pain that she localizes at the base of her skull bilaterally with radiation up the back of her head, worse with turning her neck side to side, nothing overtly radicular down the hands or to the fingertips or in the periscapular region. I do suspect this is likely facet mediated pain. Adding some prednisone, x-rays, meloxicam, formal physical therapy, return to see me in 4 to 6 weeks, MRI for interventional planning if no better. I do not think her symptoms are highly classic for occipital neuralgia considering the location of pain somewhat laterally at the site of the sternocleidomastoid origin.

## 2020-03-21 NOTE — Progress Notes (Signed)
    Procedures performed today:    None.  Independent interpretation of notes and tests performed by another provider:   None.  Brief History, Exam, Impression, and Recommendations:    Chronic upper neck pain This is a pleasant 68 year old female, she has long history of thoracic and lumbar DDD and other degenerative processes, more recently she is had pain that she localizes at the base of her skull bilaterally with radiation up the back of her head, worse with turning her neck side to side, nothing overtly radicular down the hands or to the fingertips or in the periscapular region. I do suspect this is likely facet mediated pain. Adding some prednisone, x-rays, meloxicam, formal physical therapy, return to see me in 4 to 6 weeks, MRI for interventional planning if no better. I do not think her symptoms are highly classic for occipital neuralgia considering the location of pain somewhat laterally at the site of the sternocleidomastoid origin.    ___________________________________________ Ihor Austin. Benjamin Stain, M.D., ABFM., CAQSM. Primary Care and Sports Medicine [facility]  Adjunct Instructor of Family Medicine  [employer]

## 2020-03-24 ENCOUNTER — Telehealth: Payer: Self-pay | Admitting: Neurology

## 2020-03-24 NOTE — Telephone Encounter (Signed)
Patient left vm wondering if she has had the shingles vaccine. Have had multiple calls about this, patient was instructed multiple times she will need to get this from the pharmacy as it isn't covered by Medicare. Will call patient again to make her aware.

## 2020-03-25 NOTE — Telephone Encounter (Signed)
Patient made aware.

## 2020-03-29 DIAGNOSIS — Z23 Encounter for immunization: Secondary | ICD-10-CM | POA: Diagnosis not present

## 2020-04-01 ENCOUNTER — Ambulatory Visit: Payer: Medicare Other

## 2020-05-05 ENCOUNTER — Encounter: Payer: Self-pay | Admitting: Physician Assistant

## 2020-05-05 ENCOUNTER — Telehealth (INDEPENDENT_AMBULATORY_CARE_PROVIDER_SITE_OTHER): Payer: Medicare Other | Admitting: Physician Assistant

## 2020-05-05 VITALS — Temp 99.9°F | Ht 63.0 in | Wt 135.0 lb

## 2020-05-05 DIAGNOSIS — F17200 Nicotine dependence, unspecified, uncomplicated: Secondary | ICD-10-CM

## 2020-05-05 DIAGNOSIS — J449 Chronic obstructive pulmonary disease, unspecified: Secondary | ICD-10-CM | POA: Diagnosis not present

## 2020-05-05 DIAGNOSIS — J441 Chronic obstructive pulmonary disease with (acute) exacerbation: Secondary | ICD-10-CM | POA: Diagnosis not present

## 2020-05-05 MED ORDER — DOXYCYCLINE HYCLATE 100 MG PO TABS: 100.0000 mg | ORAL_TABLET | Freq: Two times a day (BID) | ORAL | 0 refills | Status: DC

## 2020-05-05 MED ORDER — PREDNISONE 20 MG PO TABS: ORAL_TABLET | ORAL | 0 refills | Status: DC

## 2020-05-05 NOTE — Progress Notes (Deleted)
Started 1 week ago Cough Congestion Sore throat (from cough)  Body aches - went away   Has been using delsym OTC, ibuprofen for fever

## 2020-05-05 NOTE — Progress Notes (Signed)
Patient ID: [name], female   DOB: 09-12-1950, 69 y.o.   MRN: [id number] .Marland KitchenVirtual Visit via Telephone Note  I connected with [name] on 05/05/2020 at  1:40 PM EST by telephone and verified that I am speaking with the correct person using two identifiers.  Location: Patient: home Provider: clinic  .Marland KitchenParticipating in visit:  Patient: [name] Provider: Tandy Gaw PA-C Provider in training: Melissa Sauer PA-Student     I discussed the limitations, risks, security and privacy concerns of performing an evaluation and management service by telephone and the availability of in person appointments. I also discussed with the patient that there may be a patient responsible charge related to this service. The patient expressed understanding and agreed to proceed.   History of Present Illness: Pt is a 69 yo female who is a current smoker with COPD who calls into the clinic to discuss SOB, cough, congestion, ST for over a week. She initially had some body aches as well. Taking delsym and ibuprofen. March was her last COPD exacerbation. She has "cut back " on smoking. No fever, chills, nausea, vomiting, abdominal pain, diarrhea. She is using her albuterol inhaler a few times a day which helps. She is very fatigued from all the coughing. No loss of smell or taste.   .. Active Ambulatory Problems    Diagnosis Date Noted  . Mixed hyperlipidemia 09/28/2007  . HYPERCALCEMIA 11/13/2009  . MDD (major depressive disorder), recurrent episode, mild (HCC) 09/28/2007  . Chronic pain syndrome 06/05/2007  . FATTY LIVER DISEASE 09/28/2007  . ARTHRITIS, ACROMIOCLAVICULAR 07/31/2008  . SPINAL STENOSIS IN CERVICAL REGION 07/31/2008  . OSTEOPOROSIS 10/17/2007  . Elevated liver function tests 09/28/2007  . Allergic rhinitis 10/06/2010  . Chronic headache 04/03/2011  . Tobacco use 04/03/2011  . Chronic pain 04/03/2011  . Benign hypertension 04/03/2011  . Sacroiliitis (HCC) 09/14/2011  . Diabetes  type 2, controlled (HCC) 03/14/2013  . Hypothyroidism 03/14/2013  . Trochanteric bursitis of right hip 05/04/2013  . Anxiety state 05/16/2013  . Ganglion cyst of wrist 07/13/2013  . Acute pain of left shoulder 09/23/2013  . COLD (chronic obstructive lung disease) (HCC) 12/06/2013  . Hyperlipidemia 01/22/2015  . 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 Myocardial infarct (HCC) 03/09/2017  . CAD (coronary artery disease) 03/09/2017  . Rotator cuff tendonitis, left 03/10/2017  . Anxiety 03/12/2017  . Left upper arm swelling secondary to rotator cuff tear and fluid tracking down biceps tendon sheath 04/14/2017  . Common bile duct dilatation 04/27/2017  . Lumbar degenerative disc disease 05/13/2017  . Abnormal finding on GI tract imaging 05/25/2017  . Chronic back pain 02/27/2016  . Atherosclerosis of native coronary artery of native heart without angina pectoris 03/16/2016  . RA (rheumatoid arthritis) (HCC) 07/06/2016  . Acquired hypothyroidism 12/01/2015  . Moderate episode of recurrent major depressive disorder (HCC) 07/24/2017  . Bilateral calf pain 07/24/2017  . Memory changes 07/24/2017  . Bilateral cataracts 09/07/2017  . Dry eye syndrome of bilateral lacrimal glands 09/07/2017  . Positive colorectal cancer screening using Cologuard test 10/07/2017  . Microalbuminuria 10/07/2017  . Weakness of both lower extremities 10/07/2017  . History of diverticulitis 11/21/2017  . Non-intractable vomiting with nausea 11/21/2017  . Left lower quadrant guarding 11/21/2017  . Generalized abdominal pain 11/21/2017  . Vasovagal syncope 12/16/2017  . Vitamin D deficiency 04/05/2018  . Primary insomnia 04/05/2018  . Hiatal hernia 04/05/2018  . Primary osteoarthritis 07/14/2018  . Gastroesophageal reflux  disease with esophagitis 07/14/2018  . Fixed pupil of both eyes 07/18/2018  . Frequent headaches 07/18/2018  . Unsteady gait 07/18/2018  .  Fatigue 12/11/2018  . Excessive sleepiness 05/15/2019  . No appetite 05/15/2019  . SIRS (systemic inflammatory response syndrome) (HCC) 06/04/2019  . Sepsis (HCC) 06/04/2019  . Bilateral hand swelling 07/16/2019  . Bilateral hand pain 07/16/2019  . Arthralgia of lower leg 10/22/2019  . Left lumbar radiculopathy 11/05/2019  . Chronic upper neck pain 12/18/2019   Resolved Ambulatory Problems    Diagnosis Date Noted  . DM (diabetes mellitus) (HCC) 06/05/2007  . HEMORRHOIDS, WITH BLEEDING 04/22/2008  . Acute maxillary sinusitis 04/10/2010  . Alcoholic fatty liver 12/24/2009  . RENAL INSUFFICIENCY, ACUTE 10/23/2008  . ROTATOR CUFF SYNDROME, RIGHT 04/01/2008  . ALLERGIC REACTION 12/24/2009  . ELEVATED BLOOD PRESSURE WITHOUT DIAGNOSIS OF HYPERTENSION 05/13/2010  . CHRONIC OBSTRUCTIVE PULMONARY DISEASE, ACUTE EXACERBATION 08/14/2010  . Recurrent acute sinusitis 09/09/2010  . Allergic dermatitis due to poison ivy 11/27/2010  . DERMATITIS, ATOPIC 01/15/2011  . Arthritis of carpometacarpal joint 02/14/2012  . Ganglion cyst of left foot 07/13/2013  . Chronic pain syndrome 12/05/2013  . Primary osteoarthritis of left wrist 12/10/2014  . Rheumatoid arthritis involving both hands (HCC) 03/09/2017  . Bulging lumbar disc 02/27/2016  . Pyelonephritis 11/21/2017  . Upper abdominal pain 03/10/2018  . Patellar sleeve fracture, right, closed, initial encounter 04/24/2018   Past Medical History:  Diagnosis Date  . Cervicalgia   . COPD (chronic obstructive pulmonary disease) (HCC)   . DDD (degenerative disc disease), lumbar   . Diabetes mellitus   . Fatty liver   . Narcotic abuse (HCC)   . Osteoarthritis   . Osteoporosis   . Rheumatoid arthritis (HCC)   . Thyroid disease    Reviewed med, allergy, problem list.    Observations/Objective: No acute distress Speaking in complete sentences.  Productive cough heard.  .. Today's Vitals   05/05/20 1147  Temp: 99.9 F (37.7 C)  TempSrc:  Oral  Weight: 135 lb (61.2 kg)  Height: 5\' 3"  (1.6 m)   Body mass index is 23.91 kg/m.    Assessment and Plan: Marland KitchenMarland KitchenDiane was seen today for cough.  Diagnoses and all orders for this visit:  COPD exacerbation (HCC) -     predniSONE (DELTASONE) 20 MG tablet; Take 3 tablets for 3 days, take 2 tablets for 3 days, take 1 tablet for 3 days, take 1/2 tablet for 4 days. -     doxycycline (VIBRA-TABS) 100 MG tablet; Take 1 tablet (100 mg total) by mouth 2 (two) times daily.  COPD (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) -     predniSONE (DELTASONE) 20 MG tablet; Take 3 tablets for 3 days, take 2 tablets for 3 days, take 1 tablet for 3 days, take 1/2 tablet for 4 days.  Current smoker   Symptoms over a week out of window for covid testing. Pt does have 2 covid vaccines. Pt is a smoker with COPD. Sounds like COPD exacerbation. Sent prednisone and doxycycline. Pt is "cutting back on smoking". Ok to use mucinex twice a day. Use albuterol or duoneb inhaler every 2-6 hours after needed for SOB and cough.pt is not on daily inhalers. Discussed could help her breath better. Consider follow up to discuss when feeling better.     Follow Up Instructions:    I discussed the assessment and treatment plan with the patient. The patient was provided an opportunity to ask questions and all  were answered. The patient agreed with the plan and demonstrated an understanding of the instructions.   The patient was advised to call back or seek an in-person evaluation if the symptoms worsen or if the condition fails to improve as anticipated.  I provided 10 minutes of non-face-to-face time during this encounter.   Tandy Gaw, PA-C

## 2020-05-12 ENCOUNTER — Telehealth: Payer: Self-pay | Admitting: Neurology

## 2020-05-12 MED ORDER — TRELEGY ELLIPTA 100-62.5-25 MCG/INH IN AEPB: INHALATION_SPRAY | RESPIRATORY_TRACT | 2 refills | Status: DC
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
## 2020-05-12 NOTE — Telephone Encounter (Signed)
I sent Trelegy inhaler one puff once a day do it daily for the next month and let me know how doing in the next 2 weeks.

## 2020-05-12 NOTE — Telephone Encounter (Signed)
Patient called and left VM stating she doesn't feel any better.  She was seen 05/05/2020 for COPD exacerbation (virtually).   From 05/05/2020 note: Symptoms over a week out of window for covid testing. Pt does have 2 covid vaccines. Pt is a smoker with COPD. Sounds like COPD exacerbation. Sent prednisone and doxycycline. Pt is "cutting back on smoking". Ok to use mucinex twice a day. Use albuterol or duoneb inhaler every 2-6 hours after needed for SOB and cough.pt is not on daily inhalers. Discussed could help her breath better. Consider follow up to discuss when feeling better.   Spoke with patient, she states she did take doxycycline and prednisone, still on medication. She took Mucinex until two days ago. Not using albuterol or duoneb. She doesn't feel like its breathing related just an overall sense of "not feeling well". I couldn't get her to state any symptoms. No sore throat, cough, breathing issues, congestion, etc. Please advise.

## 2020-05-12 NOTE — Telephone Encounter (Signed)
Patient aware.

## 2020-05-13 MED ORDER — LEVOFLOXACIN 500 MG PO TABS: 500.0000 mg | ORAL_TABLET | Freq: Every day | ORAL | 0 refills | Status: DC

## 2020-05-13 NOTE — Telephone Encounter (Signed)
I sent levaquin for 7 days.
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
## 2020-05-13 NOTE — Telephone Encounter (Signed)
Patient left another message stating her fever is just under 100 and she feels like she needs another antibiotic. Does she need a new appt?

## 2020-05-13 NOTE — Addendum Note (Signed)
Addended by: [name] on: 05/13/2020 05:07 PM   Modules accepted: Orders

## 2020-05-14 NOTE — Telephone Encounter (Signed)
Patient made aware.

## 2020-05-19 ENCOUNTER — Telehealth: Payer: Self-pay | Admitting: Neurology

## 2020-05-19 NOTE — Telephone Encounter (Signed)
Patient left vm stating she is no better after two rounds of antibiotics. Patient will need another appt for evaluation. Please call to schedule.

## 2020-05-20 ENCOUNTER — Other Ambulatory Visit: Payer: Self-pay

## 2020-05-20 ENCOUNTER — Ambulatory Visit (INDEPENDENT_AMBULATORY_CARE_PROVIDER_SITE_OTHER): Payer: Medicare Other | Admitting: Physician Assistant

## 2020-05-20 ENCOUNTER — Encounter: Payer: Self-pay | Admitting: Physician Assistant

## 2020-05-20 VITALS — BP 145/77 | HR 110 | Temp 98.2°F | Ht 63.0 in | Wt 121.0 lb

## 2020-05-20 DIAGNOSIS — R5383 Other fatigue: Secondary | ICD-10-CM | POA: Diagnosis not present

## 2020-05-20 DIAGNOSIS — R059 Cough, unspecified: Secondary | ICD-10-CM | POA: Diagnosis not present

## 2020-05-20 DIAGNOSIS — R531 Weakness: Secondary | ICD-10-CM | POA: Diagnosis not present

## 2020-05-20 DIAGNOSIS — R509 Fever, unspecified: Secondary | ICD-10-CM | POA: Diagnosis not present

## 2020-05-20 NOTE — Progress Notes (Signed)
Subjective:    Patient ID: [name], female    DOB: [birth year], 69 y.o.   MRN: [id number]  HPI  Pt is a 69 yo female with COPD and current smoker, CAD, hypothyroidism, RA, chronic pain who present to the clinic "she doesn't feel good". She has felt extreme fatigue for over a month. Looking back on history pt does report fatigue a lot but she says where all she wants to do is sleep. She states she is taking her medication. She reports low grade fevers off and on in the low 100s. Her cough is stable. She continues to smoke. No worsening in breathing. Over the past 2 days she feels a little worse. She denies any bowel changes, dysuria, abdominal pain, headache, sinus pressure, ear pain, ST.   .. Active Ambulatory Problems    Diagnosis Date Noted  . Mixed hyperlipidemia 09/28/2007  . HYPERCALCEMIA 11/13/2009  . MDD (major depressive disorder), recurrent episode, mild (HCC) 09/28/2007  . Chronic pain syndrome 06/05/2007  . FATTY LIVER DISEASE 09/28/2007  . ARTHRITIS, ACROMIOCLAVICULAR 07/31/2008  . SPINAL STENOSIS IN CERVICAL REGION 07/31/2008  . OSTEOPOROSIS 10/17/2007  . Elevated liver function tests 09/28/2007  . Allergic rhinitis 10/06/2010  . Chronic headache 04/03/2011  . Tobacco use 04/03/2011  . Chronic pain 04/03/2011  . Benign hypertension 04/03/2011  . Sacroiliitis (HCC) 09/14/2011  . Diabetes type 2, controlled (HCC) 03/14/2013  . Hypothyroidism 03/14/2013  . Trochanteric bursitis of right hip 05/04/2013  . Anxiety state 05/16/2013  . Ganglion cyst of wrist 07/13/2013  . Acute pain of left shoulder 09/23/2013  . COLD (chronic obstructive lung disease) (HCC) 12/06/2013  . Hyperlipidemia 01/22/2015  . 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 Myocardial infarct (HCC) 03/09/2017  . CAD (coronary artery disease) 03/09/2017  . Rotator cuff tendonitis, left 03/10/2017  . Anxiety 03/12/2017  . Left upper arm swelling  secondary to rotator cuff tear and fluid tracking down biceps tendon sheath 04/14/2017  . Common bile duct dilatation 04/27/2017  . Lumbar degenerative disc disease 05/13/2017  . Abnormal finding on GI tract imaging 05/25/2017  . Chronic back pain 02/27/2016  . Atherosclerosis of native coronary artery of native heart without angina pectoris 03/16/2016  . RA (rheumatoid arthritis) (HCC) 07/06/2016  . Acquired hypothyroidism 12/01/2015  . Moderate episode of recurrent major depressive disorder (HCC) 07/24/2017  . Bilateral calf pain 07/24/2017  . Memory changes 07/24/2017  . Bilateral cataracts 09/07/2017  . Dry eye syndrome of bilateral lacrimal glands 09/07/2017  . Positive colorectal cancer screening using Cologuard test 10/07/2017  . Microalbuminuria 10/07/2017  . Weakness of both lower extremities 10/07/2017  . History of diverticulitis 11/21/2017  . Non-intractable vomiting with nausea 11/21/2017  . Left lower quadrant guarding 11/21/2017  . Generalized abdominal pain 11/21/2017  . Vasovagal syncope 12/16/2017  . Vitamin D deficiency 04/05/2018  . Primary insomnia 04/05/2018  . Hiatal hernia 04/05/2018  . Primary osteoarthritis 07/14/2018  . Gastroesophageal reflux disease with esophagitis 07/14/2018  . Fixed pupil of both eyes 07/18/2018  . Frequent headaches 07/18/2018  . Unsteady gait 07/18/2018  . Fatigue 12/11/2018  . Excessive sleepiness 05/15/2019  . No appetite 05/15/2019  . SIRS (systemic inflammatory response syndrome) (HCC) 06/04/2019  . Sepsis (HCC) 06/04/2019  . Bilateral hand swelling 07/16/2019  . Bilateral hand pain 07/16/2019  . Arthralgia of lower leg 10/22/2019  . Left lumbar radiculopathy 11/05/2019  . Chronic upper neck pain 12/18/2019   Resolved Ambulatory Problems  Diagnosis Date Noted  . DM (diabetes mellitus) (HCC) 06/05/2007  . HEMORRHOIDS, WITH BLEEDING 04/22/2008  . Acute maxillary sinusitis 04/10/2010  . Alcoholic fatty liver 12/24/2009   . RENAL INSUFFICIENCY, ACUTE 10/23/2008  . ROTATOR CUFF SYNDROME, RIGHT 04/01/2008  . ALLERGIC REACTION 12/24/2009  . ELEVATED BLOOD PRESSURE WITHOUT DIAGNOSIS OF HYPERTENSION 05/13/2010  . CHRONIC OBSTRUCTIVE PULMONARY DISEASE, ACUTE EXACERBATION 08/14/2010  . Recurrent acute sinusitis 09/09/2010  . Allergic dermatitis due to poison ivy 11/27/2010  . DERMATITIS, ATOPIC 01/15/2011  . Arthritis of carpometacarpal joint 02/14/2012  . Ganglion cyst of left foot 07/13/2013  . Chronic pain syndrome 12/05/2013  . Primary osteoarthritis of left wrist 12/10/2014  . Rheumatoid arthritis involving both hands (HCC) 03/09/2017  . Bulging lumbar disc 02/27/2016  . Pyelonephritis 11/21/2017  . Upper abdominal pain 03/10/2018  . Patellar sleeve fracture, right, closed, initial encounter 04/24/2018   Past Medical History:  Diagnosis Date  . Cervicalgia   . COPD (chronic obstructive pulmonary disease) (HCC)   . DDD (degenerative disc disease), lumbar   . Diabetes mellitus   . Fatty liver   . Narcotic abuse (HCC)   . Osteoarthritis   . Osteoporosis   . Rheumatoid arthritis (HCC)   . Thyroid disease      Review of Systems    see HPI.  Objective:   Physical Exam Vitals reviewed.  Constitutional:      Comments: Pale and fatigued appearing.   HENT:     Head: Normocephalic.     Nose: No rhinorrhea.     Mouth/Throat:     Mouth: Mucous membranes are moist.     Pharynx: No oropharyngeal exudate or posterior oropharyngeal erythema.  Eyes:     Conjunctiva/sclera: Conjunctivae normal.  Cardiovascular:     Rate and Rhythm: Regular rhythm. Tachycardia present.     Heart sounds: Normal heart sounds.  Pulmonary:     Effort: Pulmonary effort is normal.     Breath sounds: Rhonchi present. No wheezing.     Comments: Stable rhonchi that cleared with cough bilateral middle to upper lungs.  Abdominal:     General: Bowel sounds are normal. There is no distension.     Palpations: Abdomen is soft.      Tenderness: There is no abdominal tenderness. There is left CVA tenderness. There is no right CVA tenderness or guarding.  Musculoskeletal:     Right lower leg: No edema.     Left lower leg: No edema.  Skin:    Coloration: Skin is pale.  Neurological:     General: No focal deficit present.     Mental Status: She is alert.  Psychiatric:     Comments: Not real clear on questions and history. She just does not feel good.            Assessment & Plan:  Marland KitchenMarland KitchenDiane was seen today for fatigue.  Diagnoses and all orders for this visit:  Fatigue, unspecified type -     Epstein-Barr virus VCA antibody panel -     CMV abs, IgG+IgM (cytomegalovirus) -     CBC with Differential/Platelet -     COMPLETE METABOLIC PANEL WITH GFR -     Thyroid Panel With TSH -     DG Chest 2 View -     SAR CoV2 Serology (COVID 19)AB(IGG)IA  Fever and chills -     Epstein-Barr virus VCA antibody panel -     CMV abs, IgG+IgM (cytomegalovirus) -     CBC with  Differential/Platelet -     COMPLETE METABOLIC PANEL WITH GFR -     Thyroid Panel With TSH -     DG Chest 2 View -     SAR CoV2 Serology (COVID 19)AB(IGG)IA  Weakness -     Epstein-Barr virus VCA antibody panel -     CMV abs, IgG+IgM (cytomegalovirus) -     CBC with Differential/Platelet -     COMPLETE METABOLIC PANEL WITH GFR -     Thyroid Panel With TSH -     DG Chest 2 View -     SAR CoV2 Serology (COVID 19)AB(IGG)IA  Cough -     Epstein-Barr virus VCA antibody panel -     CMV abs, IgG+IgM (cytomegalovirus) -     CBC with Differential/Platelet -     COMPLETE METABOLIC PANEL WITH GFR -     Thyroid Panel With TSH -     DG Chest 2 View -     SAR CoV2 Serology (COVID 19)AB(IGG)IA   Unclear etiology of her fatigue and pt reported fever. Needs work up.  No URI symptoms. From her vitals and the sound of her lungs she does not appear to be in a COPD exacerbation.  Test for COVID/FLU.  Will get CXR.  Pt does have a little left sided CVA  tenderness. Would like to get UA dipstick and culture to rule out infection.  Pt has hx of hypothyroidism. Recheck TSH.  Will check EBV and CMV for fatigue for over a month.  Concern she could have had covid and just not tested and now having some of the post viral or post covid fatigue. Test for IGM and IGG antibodies.   I left the room and patient soon left the room stating "her husband was sick and she will have to come back later to do all these things".

## 2020-05-20 NOTE — Patient Instructions (Addendum)
..Vitamin D3 5000 IU (125 mcg) daily Vitamin C 500 mg twice daily Zinc 50 to 75 mg daily   Fatigue If you have fatigue, you feel tired all the time and have a lack of energy or a lack of motivation. Fatigue may make it difficult to start or complete tasks because of exhaustion. In general, occasional or mild fatigue is often a normal response to activity or life. However, long-lasting (chronic) or extreme fatigue may be a symptom of a medical condition. Follow these instructions at home: General instructions  Watch your fatigue for any changes.  Go to bed and get up at the same time every day.  Avoid fatigue by pacing yourself during the day and getting enough sleep at night.  Maintain a healthy weight. Medicines  Take over-the-counter and prescription medicines only as told by your health care provider.  Take a multivitamin, if told by your health care provider.  Do not use herbal or dietary supplements unless they are approved by your health care provider. Activity   Exercise regularly, as told by your health care provider.  Use or practice techniques to help you relax, such as yoga, tai chi, meditation, or massage therapy. Eating and drinking   Avoid heavy meals in the evening.  Eat a well-balanced diet, which includes lean proteins, whole grains, plenty of fruits and vegetables, and low-fat dairy products.  Avoid consuming too much caffeine.  Avoid the use of alcohol.  Drink enough fluid to keep your urine pale yellow. Lifestyle  Change situations that cause you stress. Try to keep your work and personal schedule in balance.  Do not use any products that contain nicotine or tobacco, such as cigarettes and e-cigarettes. If you need help quitting, ask your health care provider.  Do not use drugs. Contact a health care provider if:  Your fatigue does not get better.  You have a fever.  You suddenly lose or gain weight.  You have headaches.  You have  trouble falling asleep or sleeping through the night.  You feel angry, guilty, anxious, or sad.  You are unable to have a bowel movement (constipation).  Your skin is dry.  You have swelling in your legs or another part of your body. Get help right away if:  You feel confused.  Your vision is blurry.  You feel faint or you pass out.  You have a severe headache.  You have severe pain in your abdomen, your back, or the area between your waist and hips (pelvis).  You have chest pain, shortness of breath, or an irregular or fast heartbeat.  You are unable to urinate, or you urinate less than normal.  You have abnormal bleeding, such as bleeding from the rectum, vagina, nose, lungs, or nipples.  You vomit blood.  You have thoughts about hurting yourself or others. If you ever feel like you may hurt yourself or others, or have thoughts about taking your own life, get help right away. You can go to your nearest emergency department or call:  Your local emergency services (911 in the [place]  A suicide crisis helpline, such as the National Suicide Prevention Lifeline at 1-800-273-8255. This is open 24 hours a day. Summary  If you have fatigue, you feel tired all the time and have a lack of energy or a lack of motivation.  Fatigue may make it difficult to start or complete tasks because of exhaustion.  Long-lasting (chronic) or extreme fatigue may be a symptom of a medical condition.    Exercise regularly, as told by your health care provider.  Change situations that cause you stress. Try to keep your work and personal schedule in balance. This information is not intended to replace advice given to you by your health care provider. Make sure you discuss any questions you have with your health care provider. Document Revised: 12/06/2018 Document Reviewed: 02/09/2017 Elsevier Patient Education  2020 [employer]  

## 2020-05-21 ENCOUNTER — Encounter: Payer: Self-pay | Admitting: Physician Assistant

## 2020-05-31 ENCOUNTER — Other Ambulatory Visit: Payer: Self-pay | Admitting: Physician Assistant

## 2020-05-31 DIAGNOSIS — F5101 Primary insomnia: Secondary | ICD-10-CM

## 2020-06-11 ENCOUNTER — Telehealth: Payer: Self-pay

## 2020-06-11 NOTE — Telephone Encounter (Signed)
I sent the new dose but you should not be taking 2 tablets.

## 2020-06-11 NOTE — Telephone Encounter (Signed)
Pt. Is advised that her levothyroxine medication was sent and the dose she need to take.

## 2020-06-11 NOTE — Telephone Encounter (Signed)
Pt called stating that she is taking 2 levothyroxine a day and its making her sleep a lot.

## 2020-06-14 ENCOUNTER — Other Ambulatory Visit: Payer: Self-pay | Admitting: Physician Assistant

## 2020-06-14 DIAGNOSIS — F419 Anxiety disorder, unspecified: Secondary | ICD-10-CM

## 2020-06-18 ENCOUNTER — Telehealth: Payer: Self-pay

## 2020-06-18 NOTE — Telephone Encounter (Signed)
Pt stated that she needs a refill on Xanax.

## 2020-06-18 NOTE — Telephone Encounter (Signed)
Refill has been sent.  °

## 2020-06-19 NOTE — Telephone Encounter (Signed)
LVM stating that a refill was sent to the pharmacy.

## 2020-06-24 NOTE — Telephone Encounter (Signed)
Open error 

## 2020-06-25 ENCOUNTER — Telehealth: Payer: Self-pay | Admitting: Neurology

## 2020-06-25 NOTE — Telephone Encounter (Signed)
Patient left vm stating it was VERY important that I call her back.   Called back with no answer and no vm.
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
## 2020-07-09 ENCOUNTER — Telehealth: Payer: Self-pay | Admitting: Neurology

## 2020-07-09 NOTE — Telephone Encounter (Signed)
Patient left vm stating she is having a lot of pain in her hands/wrists. She asked for RX for prednisone. LMOM letting her know she will need at least a virtual appt to discuss with any provider since Jla Reynolds's schedule is full and she is not here the rest of the week. She was encouraged to call back to schedule.

## 2020-07-15 ENCOUNTER — Telehealth (INDEPENDENT_AMBULATORY_CARE_PROVIDER_SITE_OTHER): Payer: Medicare HMO | Admitting: Physician Assistant

## 2020-07-15 ENCOUNTER — Telehealth: Payer: Self-pay | Admitting: Physician Assistant

## 2020-07-15 ENCOUNTER — Encounter: Payer: Self-pay | Admitting: Physician Assistant

## 2020-07-15 VITALS — Ht 63.0 in | Wt 121.0 lb

## 2020-07-15 DIAGNOSIS — Z5329 Procedure and treatment not carried out because of patient's decision for other reasons: Secondary | ICD-10-CM

## 2020-07-15 DIAGNOSIS — Z91199 Patient's noncompliance with other medical treatment and regimen due to unspecified reason: Secondary | ICD-10-CM

## 2020-07-15 NOTE — Telephone Encounter (Signed)
Patient stated she thought she was having a heart attack. I offered to call 911 and she said she was going to sleep. Amber, triage lead, spoke to her and she said she was going to call 911.

## 2020-07-15 NOTE — Progress Notes (Signed)
Called last Wednesday with pain in both hands Thinks arthritis Stated she thought she got "fired" from her rheumatologist Wanting prednisone

## 2020-07-15 NOTE — Telephone Encounter (Signed)
Called pt back and convinced her to call 911 so they could assess her and take her to the hospital if needed. She stated that she's felt extremely tired lately and has been under a lot of stress.  I reminded her that extreme stress can sometimes bring on a heart attack, especially if there has been one in the past and when the heart is unhealthy.

## 2020-07-15 NOTE — Progress Notes (Signed)
Patient ID: [name], female   DOB: 11/25/1950, 70 y.o.   MRN: [id number] Pt called back and stated she was having chest pains and "thinks she is having a heart attack". She was advised to hang up and call 911. Pt agreed.

## 2020-07-16 ENCOUNTER — Telehealth (INDEPENDENT_AMBULATORY_CARE_PROVIDER_SITE_OTHER): Payer: Medicare HMO | Admitting: Physician Assistant

## 2020-07-16 VITALS — Ht 63.0 in | Wt 121.0 lb

## 2020-07-16 DIAGNOSIS — I2510 Atherosclerotic heart disease of native coronary artery without angina pectoris: Secondary | ICD-10-CM | POA: Diagnosis not present

## 2020-07-16 DIAGNOSIS — M0579 Rheumatoid arthritis with rheumatoid factor of multiple sites without organ or systems involvement: Secondary | ICD-10-CM

## 2020-07-16 DIAGNOSIS — M542 Cervicalgia: Secondary | ICD-10-CM | POA: Diagnosis not present

## 2020-07-16 DIAGNOSIS — E782 Mixed hyperlipidemia: Secondary | ICD-10-CM

## 2020-07-16 DIAGNOSIS — F330 Major depressive disorder, recurrent, mild: Secondary | ICD-10-CM

## 2020-07-16 DIAGNOSIS — E559 Vitamin D deficiency, unspecified: Secondary | ICD-10-CM | POA: Diagnosis not present

## 2020-07-16 DIAGNOSIS — E039 Hypothyroidism, unspecified: Secondary | ICD-10-CM | POA: Diagnosis not present

## 2020-07-16 DIAGNOSIS — R69 Illness, unspecified: Secondary | ICD-10-CM | POA: Diagnosis not present

## 2020-07-16 DIAGNOSIS — F331 Major depressive disorder, recurrent, moderate: Secondary | ICD-10-CM

## 2020-07-16 MED ORDER — MELOXICAM 15 MG PO TABS: ORAL_TABLET | ORAL | 1 refills | Status: DC

## 2020-07-16 MED ORDER — LEVOTHYROXINE SODIUM 150 MCG PO TABS
ORAL_TABLET | ORAL | 0 refills | Status: DC
Start: 2020-07-16 — End: 2020-11-28

## 2020-07-16 MED ORDER — METHYLPREDNISOLONE 4 MG PO TBPK: ORAL_TABLET | ORAL | 0 refills | Status: DC

## 2020-07-16 MED ORDER — METHOTREXATE SODIUM 15 MG PO TABS
15.0000 mg | ORAL_TABLET | ORAL | 1 refills | Status: DC
Start: 2020-07-16 — End: 2020-11-28

## 2020-07-16 MED ORDER — DESVENLAFAXINE SUCCINATE ER 100 MG PO TB24
100.0000 mg | ORAL_TABLET | Freq: Every day | ORAL | 1 refills | Status: DC
Start: 2020-07-16 — End: 2021-01-07

## 2020-07-16 MED ORDER — ATORVASTATIN CALCIUM 80 MG PO TABS
80.0000 mg | ORAL_TABLET | Freq: Every day | ORAL | 3 refills | Status: AC
Start: 2020-07-16 — End: ?

## 2020-07-16 MED ORDER — HYDROXYCHLOROQUINE SULFATE 200 MG PO TABS
ORAL_TABLET | ORAL | 1 refills | Status: DC
Start: 2020-07-16 — End: 2021-01-07

## 2020-07-16 MED ORDER — VITAMIN D (ERGOCALCIFEROL) 1.25 MG (50000 UNIT) PO CAPS: 50000.0000 [IU] | ORAL_CAPSULE | ORAL | 1 refills | Status: DC

## 2020-07-16 MED ORDER — BUPROPION HCL ER (XL) 150 MG PO TB24
ORAL_TABLET | ORAL | 1 refills | Status: DC
Start: 2020-07-16 — End: 2021-01-07

## 2020-07-16 NOTE — Progress Notes (Signed)
Patient ID: [name], female   DOB: 09/03/1950, 70 y.o.   MRN: [id number] .Marland KitchenVirtual Visit via Telephone Note  I connected with [name] on 07/16/2020 at  2:20 PM EST by telephone and verified that I am speaking with the correct person using two identifiers.  Location: Patient: home Provider: clinic  .Marland KitchenParticipating in visit:  Patient: [name] Provider: Tandy Gaw PA-C   I discussed the limitations, risks, security and privacy concerns of performing an evaluation and management service by telephone and the availability of in person appointments. I also discussed with the patient that there may be a patient responsible charge related to this service. The patient expressed understanding and agreed to proceed.   History of Present Illness: Pt is a 70 year old female with rheumatoid arthritis, osteoarthritis, CAD, COPD, GERD, hypothyroidism, osteoporosis, anxiety, depression who comes into the clinic for medication management.  She was fired from her rheumatologist because of missing too many appointments.  She has been out of her Plaquenil and methotrexate.  She has also been out of her meloxicam.  She is in the lot of joint pain and stiffness.  She is requesting some prednisone to help get her through this exacerbation and to restart her medications.  .. Active Ambulatory Problems    Diagnosis Date Noted  . Mixed hyperlipidemia 09/28/2007  . HYPERCALCEMIA 11/13/2009  . MDD (major depressive disorder), recurrent episode, mild (HCC) 09/28/2007  . Chronic pain syndrome 06/05/2007  . FATTY LIVER DISEASE 09/28/2007  . ARTHRITIS, ACROMIOCLAVICULAR 07/31/2008  . SPINAL STENOSIS IN CERVICAL REGION 07/31/2008  . OSTEOPOROSIS 10/17/2007  . Elevated liver function tests 09/28/2007  . Allergic rhinitis 10/06/2010  . Chronic headache 04/03/2011  . Tobacco use 04/03/2011  . Chronic pain 04/03/2011  . Benign hypertension 04/03/2011  . Sacroiliitis (HCC) 09/14/2011  . Diabetes type 2,  controlled (HCC) 03/14/2013  . Hypothyroidism 03/14/2013  . Trochanteric bursitis of right hip 05/04/2013  . Anxiety state 05/16/2013  . Ganglion cyst of wrist 07/13/2013  . Acute pain of left shoulder 09/23/2013  . COLD (chronic obstructive lung disease) (HCC) 12/06/2013  . Hyperlipidemia 01/22/2015  . 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 Myocardial infarct (HCC) 03/09/2017  . CAD (coronary artery disease) 03/09/2017  . Rotator cuff tendonitis, left 03/10/2017  . Anxiety 03/12/2017  . Left upper arm swelling secondary to rotator cuff tear and fluid tracking down biceps tendon sheath 04/14/2017  . Common bile duct dilatation 04/27/2017  . Lumbar degenerative disc disease 05/13/2017  . Abnormal finding on GI tract imaging 05/25/2017  . Chronic back pain 02/27/2016  . Atherosclerosis of native coronary artery of native heart without angina pectoris 03/16/2016  . RA (rheumatoid arthritis) (HCC) 07/06/2016  . Acquired hypothyroidism 12/01/2015  . Moderate episode of recurrent major depressive disorder (HCC) 07/24/2017  . Bilateral calf pain 07/24/2017  . Memory changes 07/24/2017  . Bilateral cataracts 09/07/2017  . Dry eye syndrome of bilateral lacrimal glands 09/07/2017  . Positive colorectal cancer screening using Cologuard test 10/07/2017  . Microalbuminuria 10/07/2017  . Weakness of both lower extremities 10/07/2017  . History of diverticulitis 11/21/2017  . Non-intractable vomiting with nausea 11/21/2017  . Left lower quadrant guarding 11/21/2017  . Generalized abdominal pain 11/21/2017  . Vasovagal syncope 12/16/2017  . Vitamin D deficiency 04/05/2018  . Primary insomnia 04/05/2018  . Hiatal hernia 04/05/2018  . Primary osteoarthritis 07/14/2018  . Gastroesophageal reflux disease with esophagitis 07/14/2018  . Fixed pupil of both eyes 07/18/2018  .  Frequent headaches 07/18/2018  . Unsteady gait 07/18/2018  . Fatigue  12/11/2018  . Excessive sleepiness 05/15/2019  . No appetite 05/15/2019  . SIRS (systemic inflammatory response syndrome) (HCC) 06/04/2019  . Sepsis (HCC) 06/04/2019  . Bilateral hand swelling 07/16/2019  . Bilateral hand pain 07/16/2019  . Arthralgia of lower leg 10/22/2019  . Left lumbar radiculopathy 11/05/2019  . Chronic upper neck pain 12/18/2019   Resolved Ambulatory Problems    Diagnosis Date Noted  . DM (diabetes mellitus) (HCC) 06/05/2007  . HEMORRHOIDS, WITH BLEEDING 04/22/2008  . Acute maxillary sinusitis 04/10/2010  . Alcoholic fatty liver 12/24/2009  . RENAL INSUFFICIENCY, ACUTE 10/23/2008  . ROTATOR CUFF SYNDROME, RIGHT 04/01/2008  . ALLERGIC REACTION 12/24/2009  . ELEVATED BLOOD PRESSURE WITHOUT DIAGNOSIS OF HYPERTENSION 05/13/2010  . CHRONIC OBSTRUCTIVE PULMONARY DISEASE, ACUTE EXACERBATION 08/14/2010  . Recurrent acute sinusitis 09/09/2010  . Allergic dermatitis due to poison ivy 11/27/2010  . DERMATITIS, ATOPIC 01/15/2011  . Arthritis of carpometacarpal joint 02/14/2012  . Ganglion cyst of left foot 07/13/2013  . Chronic pain syndrome 12/05/2013  . Primary osteoarthritis of left wrist 12/10/2014  . Rheumatoid arthritis involving both hands (HCC) 03/09/2017  . Bulging lumbar disc 02/27/2016  . Pyelonephritis 11/21/2017  . Upper abdominal pain 03/10/2018  . Patellar sleeve fracture, right, closed, initial encounter 04/24/2018   Past Medical History:  Diagnosis Date  . Cervicalgia   . COPD (chronic obstructive pulmonary disease) (HCC)   . DDD (degenerative disc disease), lumbar   . Diabetes mellitus   . Fatty liver   . Narcotic abuse (HCC)   . Osteoarthritis   . Osteoporosis   . Rheumatoid arthritis (HCC)   . Thyroid disease    Reviewed med, allergy, problem list.     Observations/Objective: No acute distress Normal breathing  .[name] Today's Vitals   07/16/20 1132  Weight: 121 lb (54.9 kg)  Height: 5\' 3"  (1.6 m)   Body mass index is 21.43  kg/m.    Assessment and Plan: Marland KitchenDiane was seen today for hand pain.  Diagnoses and all orders for this visit:  Mixed hyperlipidemia -     atorvastatin (LIPITOR) 80 MG tablet; Take 1 tablet (80 mg total) by mouth daily.  Rheumatoid arthritis involving multiple sites with positive rheumatoid factor (HCC) -     methotrexate (RHEUMATREX) 15 MG tablet; Take 1 tablet (15 mg total) by mouth once a week. Caution: Chemotherapy. Protect from light. -     hydroxychloroquine (PLAQUENIL) 200 MG tablet; Take one tablet by mouth daily. -     methylPREDNISolone (MEDROL DOSEPAK) 4 MG TBPK tablet; Take as directed by package insert. -     Ambulatory referral to Rheumatology  Vitamin D deficiency -     Vitamin D, Ergocalciferol, (DRISDOL) 1.25 MG (50000 UNIT) CAPS capsule; Take 1 capsule (50,000 Units total) by mouth every 7 (seven) days.  Neck pain -     meloxicam (MOBIC) 15 MG tablet; One tab PO qAM with a meal for 2 weeks, then daily prn pain.  (wait until after the prednisone to start this)  Acquired hypothyroidism -     levothyroxine (SYNTHROID) 150 MCG tablet; 1 tab (150 mcg) by mouth every Sunday, Monday, Wednesday, Friday. 2 tabs (300 mcg) by mouth every Tuesday, Thursday, Saturday.  Moderate episode of recurrent major depressive disorder (HCC) -     desvenlafaxine (PRISTIQ) 100 MG 24 hr tablet; Take 1 tablet (100 mg total) by mouth daily.  MDD (major depressive disorder), recurrent episode, mild (HCC) -  buPROPion (WELLBUTRIN XL) 150 MG 24 hr tablet; TAKE 1 TABLET BY MOUTH EVERY DAY IN THE MORNING  Coronary artery disease involving native heart without angina pectoris, unspecified vessel or lesion type -     atorvastatin (LIPITOR) 80 MG tablet; Take 1 tablet (80 mg total) by mouth daily.   It appears that patient has not been on many of her medications. Discussed so important not to stop taking medications. New Rheumatology referral made. I will bridge her since her pain is so bad on  the regimen she was on with plaquenil and methotrexate. Medrol dose pack given today to transition her. Refilled mobic.   Get back on medications and will recheck labs in 3 months.   Follow Up Instructions:    I discussed the assessment and treatment plan with the patient. The patient was provided an opportunity to ask questions and all were answered. The patient agreed with the plan and demonstrated an understanding of the instructions.   The patient was advised to call back or seek an in-person evaluation if the symptoms worsen or if the condition fails to improve as anticipated.  I provided 20 minutes of non-face-to-face time during this encounter.   Tandy Gaw, PA-C

## 2020-07-18 ENCOUNTER — Encounter: Payer: Self-pay | Admitting: Physician Assistant

## 2020-07-19 ENCOUNTER — Other Ambulatory Visit: Payer: Self-pay | Admitting: Physician Assistant

## 2020-07-19 DIAGNOSIS — M5136 Other intervertebral disc degeneration, lumbar region: Secondary | ICD-10-CM

## 2020-07-23 ENCOUNTER — Ambulatory Visit: Payer: Medicare HMO | Admitting: Physician Assistant

## 2020-07-23 IMAGING — DX DG CHEST 2V
2 series · 2 of 2 positions shown · non-contrast
Comparison: 02/09/2019

CLINICAL DATA: Cough, assess intrathoracic pathology, central
lobular emphysema, COPD, type II diabetes mellitus, hyperlipidemia,
rheumatoid arthritis, benign hypertension

EXAM:
CHEST - 2 VIEW

[chest pa]
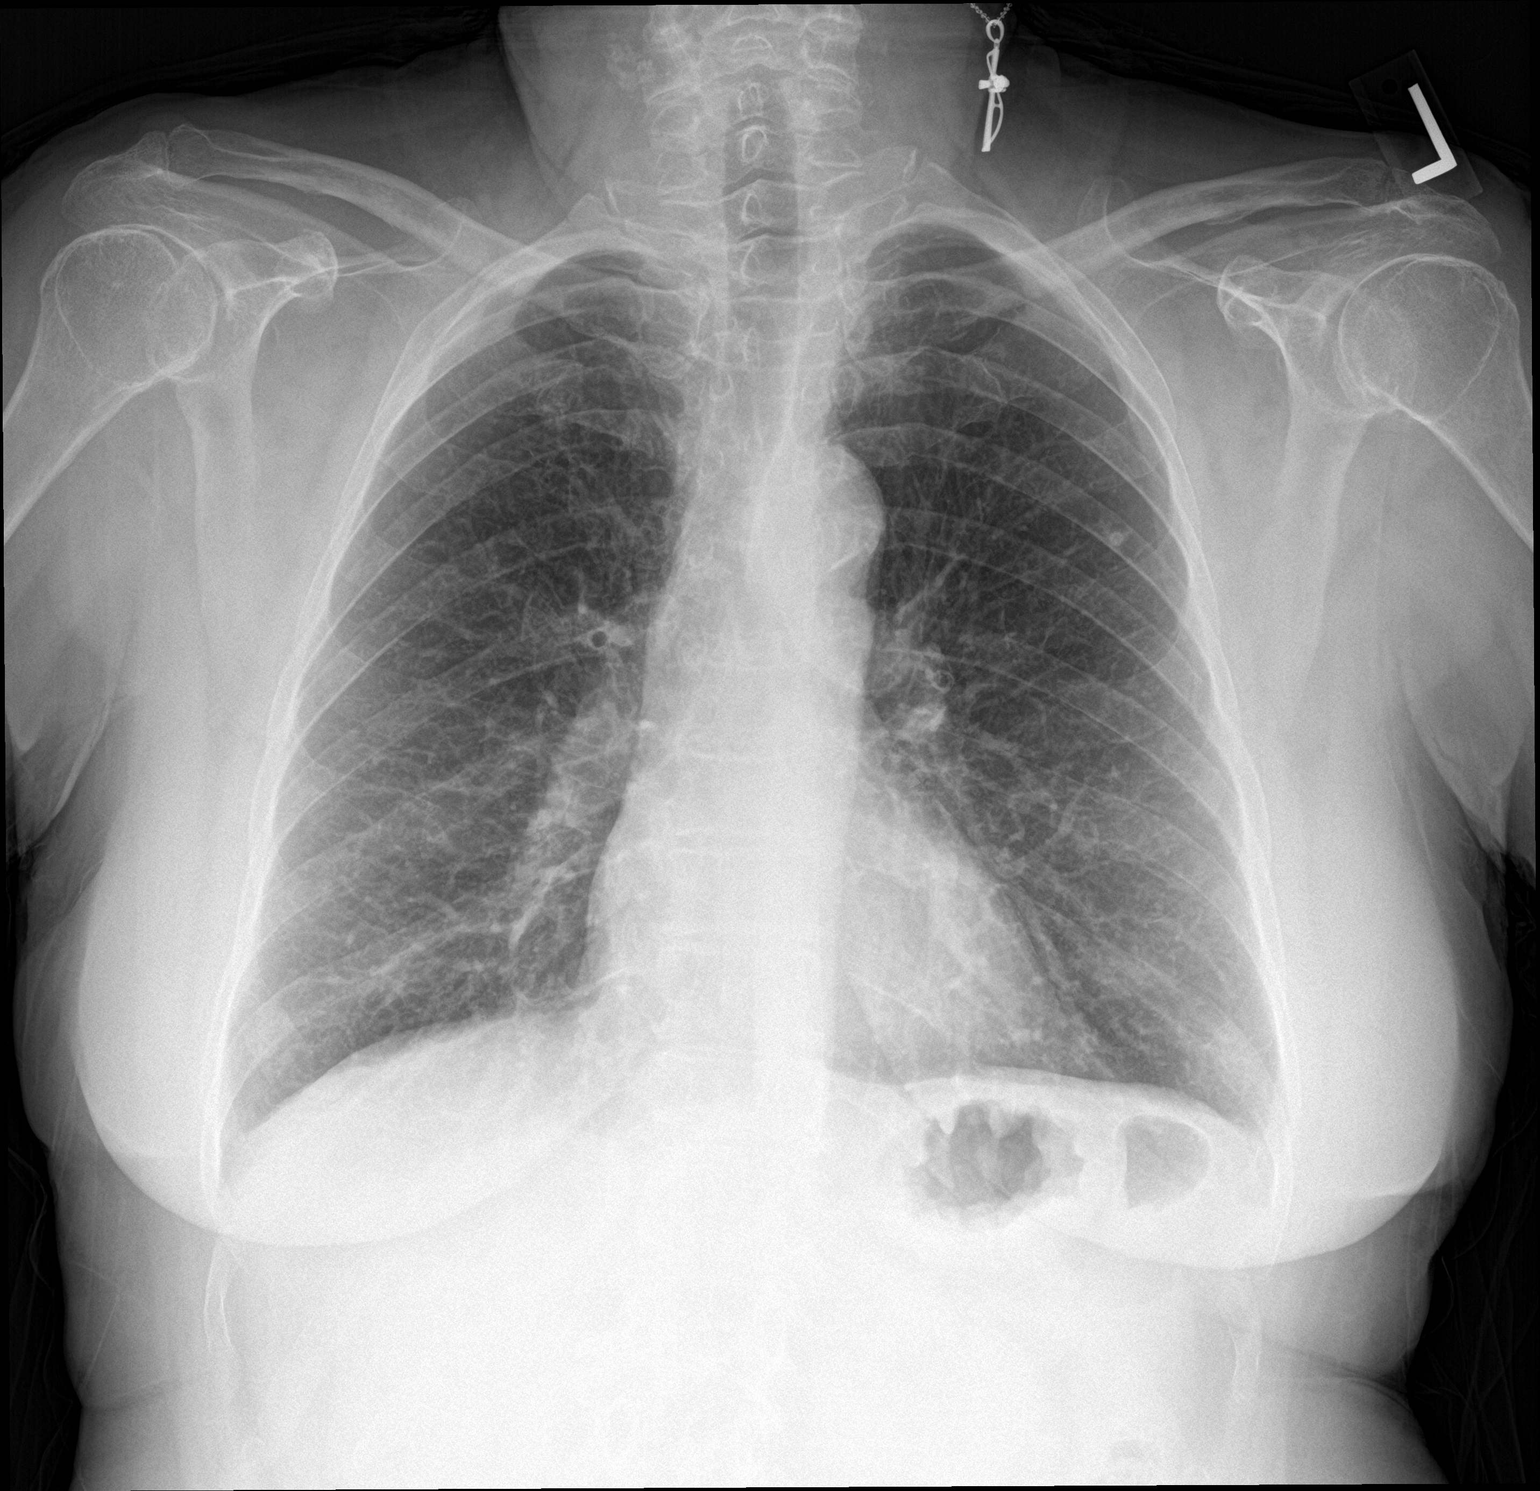

[chest lat]
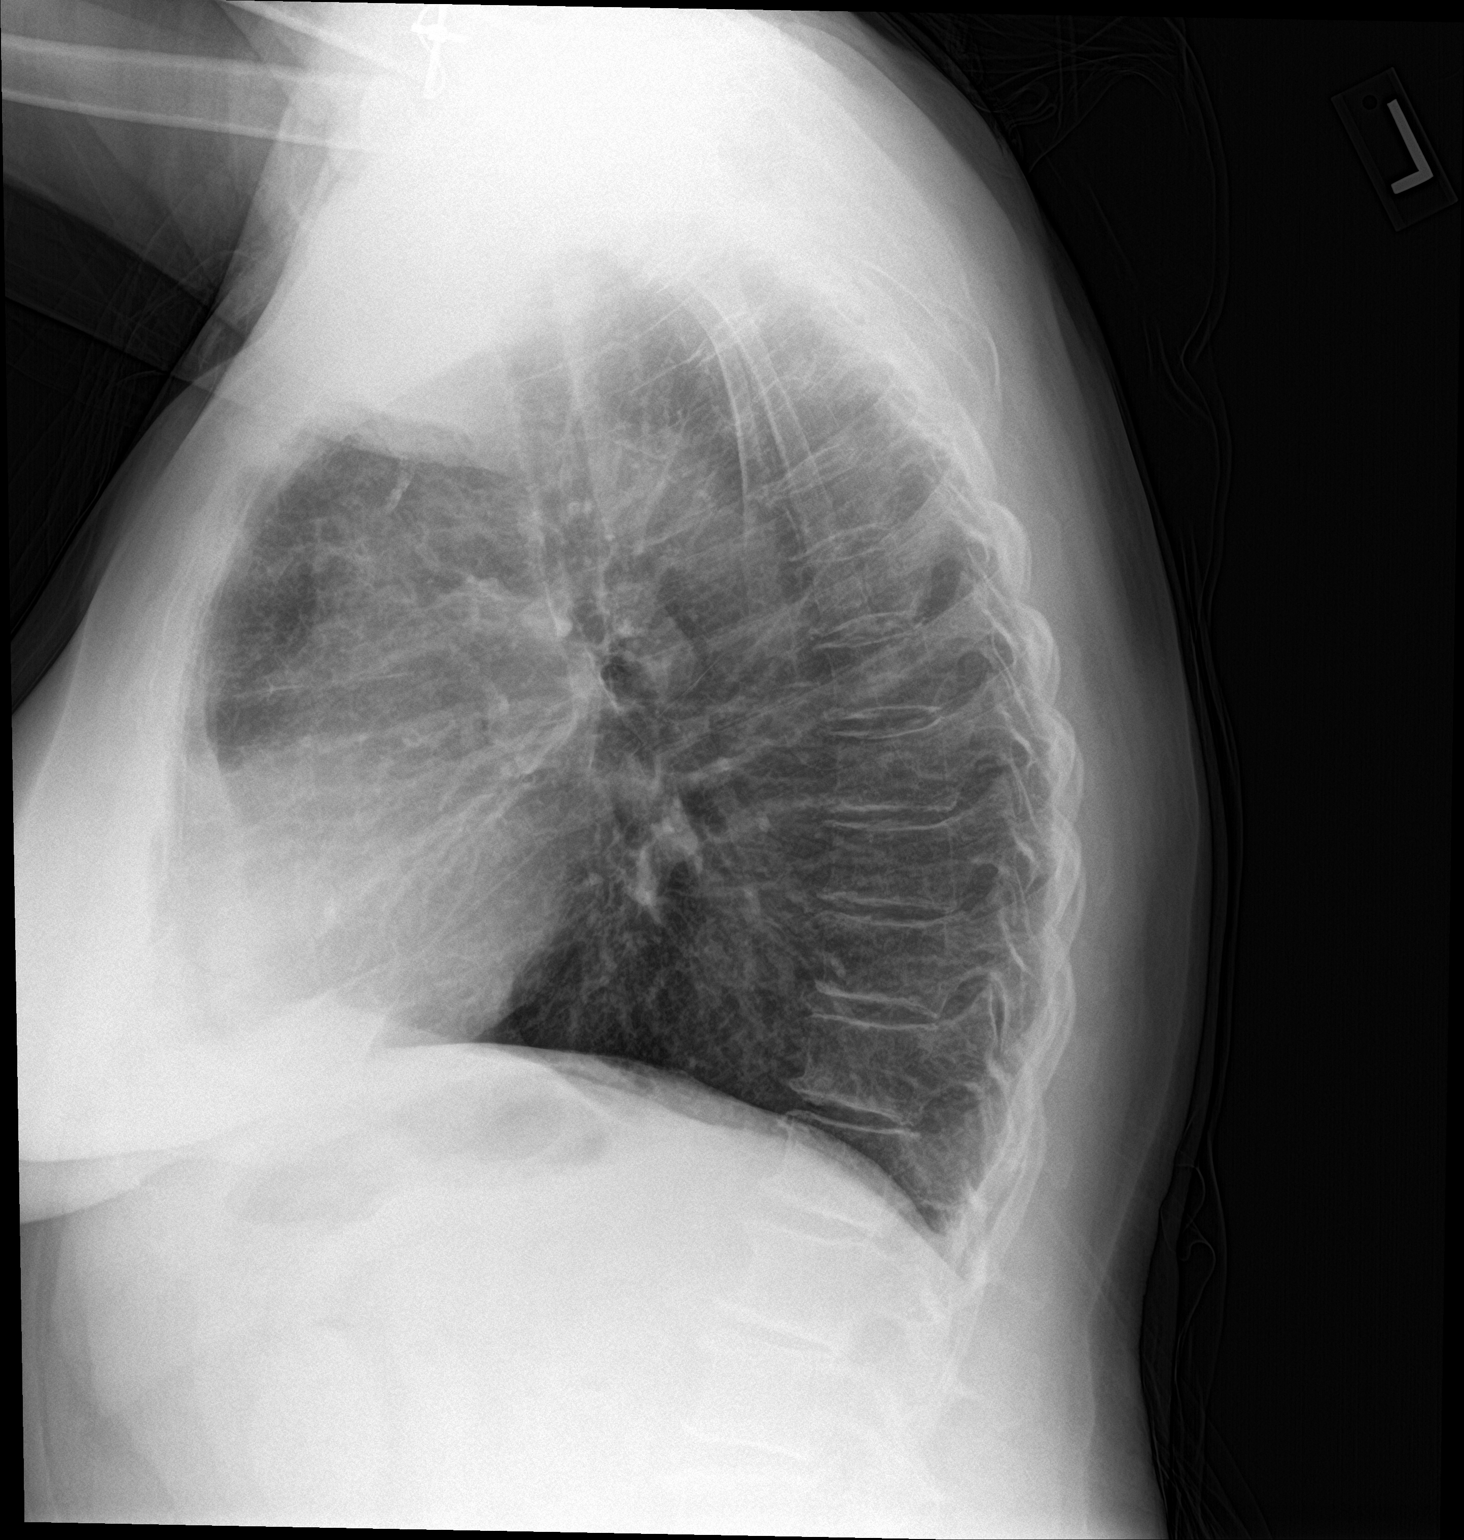

[2 of 2 positions shown; findings below may reference images not displayed]

FINDINGS: Normal heart size, mediastinal contours, and pulmonary vascularity.

Atherosclerotic calcification aorta.

Chronic bronchitic changes without pulmonary infiltrate, pleural
effusion, or pneumothorax.

Calcified granuloma LEFT upper lobe.

Bones demineralized.
IMPRESSION: Chronic bronchitic changes without infiltrate.  At

## 2020-07-25 ENCOUNTER — Ambulatory Visit (INDEPENDENT_AMBULATORY_CARE_PROVIDER_SITE_OTHER): Payer: Medicare HMO | Admitting: Physician Assistant

## 2020-07-25 DIAGNOSIS — Z5329 Procedure and treatment not carried out because of patient's decision for other reasons: Secondary | ICD-10-CM

## 2020-07-25 NOTE — Progress Notes (Signed)
No show

## 2020-07-28 ENCOUNTER — Telehealth: Payer: Self-pay | Admitting: Neurology

## 2020-07-28 NOTE — Telephone Encounter (Signed)
Patient left vm stating she would like a low dose Prednisone RX until her rheumatology appt that is scheduled 08/28/2020. She has appt scheduled tomorrow, but that is all she was wanting. Just had Medrol dose pack on 07/16/2020. Please advise.

## 2020-07-29 ENCOUNTER — Ambulatory Visit: Payer: Medicare HMO | Admitting: Physician Assistant

## 2020-07-29 MED ORDER — PREDNISONE 5 MG PO TABS: 5.0000 mg | ORAL_TABLET | Freq: Every day | ORAL | 0 refills | Status: DC

## 2020-07-29 NOTE — Telephone Encounter (Signed)
LMOM letting patient know. 

## 2020-07-29 NOTE — Telephone Encounter (Signed)
Sent prednisone 5mg  daily until her rheumatology appt.

## 2020-07-29 NOTE — Addendum Note (Signed)
Addended by: [name] on: 07/29/2020 09:25 AM   Modules accepted: Orders

## 2020-08-01 ENCOUNTER — Encounter: Payer: Self-pay | Admitting: Physician Assistant

## 2020-08-01 ENCOUNTER — Ambulatory Visit (INDEPENDENT_AMBULATORY_CARE_PROVIDER_SITE_OTHER): Payer: Medicare HMO | Admitting: Physician Assistant

## 2020-08-01 ENCOUNTER — Other Ambulatory Visit: Payer: Self-pay

## 2020-08-01 VITALS — BP 163/74 | HR 99 | Ht 63.0 in | Wt 130.0 lb

## 2020-08-01 DIAGNOSIS — M069 Rheumatoid arthritis, unspecified: Secondary | ICD-10-CM

## 2020-08-01 DIAGNOSIS — E119 Type 2 diabetes mellitus without complications: Secondary | ICD-10-CM | POA: Diagnosis not present

## 2020-08-01 DIAGNOSIS — I2510 Atherosclerotic heart disease of native coronary artery without angina pectoris: Secondary | ICD-10-CM | POA: Diagnosis not present

## 2020-08-01 DIAGNOSIS — M810 Age-related osteoporosis without current pathological fracture: Secondary | ICD-10-CM

## 2020-08-01 DIAGNOSIS — R5382 Chronic fatigue, unspecified: Secondary | ICD-10-CM

## 2020-08-01 DIAGNOSIS — E039 Hypothyroidism, unspecified: Secondary | ICD-10-CM | POA: Diagnosis not present

## 2020-08-01 DIAGNOSIS — Z720 Tobacco use: Secondary | ICD-10-CM

## 2020-08-01 DIAGNOSIS — Z79899 Other long term (current) drug therapy: Secondary | ICD-10-CM | POA: Diagnosis not present

## 2020-08-01 LAB — POCT GLYCOSYLATED HEMOGLOBIN (HGB A1C): Hemoglobin A1C: 7 % — AB (ref 4.0–5.6)

## 2020-08-01 MED ORDER — METFORMIN HCL 1000 MG PO TABS: 1000.0000 mg | ORAL_TABLET | Freq: Two times a day (BID) | ORAL | 3 refills | Status: AC
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
## 2020-08-04 DIAGNOSIS — Z79899 Other long term (current) drug therapy: Secondary | ICD-10-CM | POA: Insufficient documentation

## 2020-08-05 DIAGNOSIS — Z720 Tobacco use: Secondary | ICD-10-CM | POA: Insufficient documentation

## 2020-08-12 ENCOUNTER — Other Ambulatory Visit: Payer: Self-pay | Admitting: Physician Assistant

## 2020-08-15 ENCOUNTER — Telehealth: Payer: Self-pay | Admitting: Neurology

## 2020-08-15 DIAGNOSIS — G894 Chronic pain syndrome: Secondary | ICD-10-CM

## 2020-08-15 DIAGNOSIS — M069 Rheumatoid arthritis, unspecified: Secondary | ICD-10-CM

## 2020-08-15 NOTE — Telephone Encounter (Signed)
Patient called back again and asked for referral to pain management. Pended. Sign if appropriate.

## 2020-08-15 NOTE — Telephone Encounter (Signed)
Patient left vm asking for prednisone RX. States hands are really bad.  Called and LMOM letting her know #[phone] sent on 07/29/2020 for her to take one a day. Encouraged her to pick up if she did not get this RX, but should still have some left if she received it.

## 2020-08-15 NOTE — Addendum Note (Signed)
Addended bySilvio Pate on: 08/15/2020 02:32 PM   Modules accepted: Orders

## 2020-08-15 NOTE — Addendum Note (Signed)
Addended by: [name] on: 08/15/2020 02:58 PM   Modules accepted: Orders

## 2020-08-19 DIAGNOSIS — E039 Hypothyroidism, unspecified: Secondary | ICD-10-CM | POA: Diagnosis not present

## 2020-08-19 DIAGNOSIS — R5382 Chronic fatigue, unspecified: Secondary | ICD-10-CM | POA: Diagnosis not present

## 2020-08-19 DIAGNOSIS — E119 Type 2 diabetes mellitus without complications: Secondary | ICD-10-CM | POA: Diagnosis not present

## 2020-08-19 DIAGNOSIS — I2510 Atherosclerotic heart disease of native coronary artery without angina pectoris: Secondary | ICD-10-CM | POA: Diagnosis not present

## 2020-08-20 LAB — THYROID PANEL WITH TSH
Free Thyroxine Index: 2.8 (ref 1.4–3.8)
T3 Uptake: 29 % (ref 22–35)
T4, Total: 9.7 ug/dL (ref 5.1–11.9)
TSH: 0.71 mIU/L (ref 0.40–4.50)

## 2020-08-20 LAB — CBC WITH DIFFERENTIAL/PLATELET
Absolute Monocytes: 662 cells/uL (ref 200–950)
Basophils Absolute: 60 cells/uL (ref 0–200)
Basophils Relative: 0.7 %
Eosinophils Absolute: 353 cells/uL (ref 15–500)
Eosinophils Relative: 4.1 %
HCT: 39.7 % (ref 35.0–45.0)
Hemoglobin: 13.1 g/dL (ref 11.7–15.5)
Lymphs Abs: 2657 cells/uL (ref 850–3900)
MCH: 30.3 pg (ref 27.0–33.0)
MCHC: 33 g/dL (ref 32.0–36.0)
MCV: 91.9 fL (ref 80.0–100.0)
MPV: 10.7 fL (ref 7.5–12.5)
Monocytes Relative: 7.7 %
Neutro Abs: 4868 cells/uL (ref 1500–7800)
Neutrophils Relative %: 56.6 %
Platelets: 280 10*3/uL (ref 140–400)
RBC: 4.32 10*6/uL (ref 3.80–5.10)
RDW: 12 % (ref 11.0–15.0)
Total Lymphocyte: 30.9 %
WBC: 8.6 10*3/uL (ref 3.8–10.8)

## 2020-08-20 LAB — IRON,TIBC AND FERRITIN PANEL
%SAT: 13 % (calc) — ABNORMAL LOW (ref 16–45)
Ferritin: 176 ng/mL (ref 16–288)
Iron: 37 ug/dL — ABNORMAL LOW (ref 45–160)
TIBC: 283 mcg/dL (calc) (ref 250–450)

## 2020-08-20 LAB — COMPLETE METABOLIC PANEL WITH GFR
AG Ratio: 1.2 (calc) (ref 1.0–2.5)
ALT: 9 U/L (ref 6–29)
AST: 11 U/L (ref 10–35)
Albumin: 3.8 g/dL (ref 3.6–5.1)
Alkaline phosphatase (APISO): 88 U/L (ref 37–153)
BUN: 14 mg/dL (ref 7–25)
CO2: 26 mmol/L (ref 20–32)
Calcium: 9.5 mg/dL (ref 8.6–10.4)
Chloride: 106 mmol/L (ref 98–110)
Creat: 0.68 mg/dL (ref 0.60–0.93)
GFR, Est African American: 102 mL/min/{1.73_m2} (ref >=60)
GFR, Est Non African American: 88 mL/min/{1.73_m2} (ref >=60)
Globulin: 3.1 g/dL (calc) (ref 1.9–3.7)
Glucose, Bld: 194 mg/dL — ABNORMAL HIGH (ref 65–99)
Potassium: 4 mmol/L (ref 3.5–5.3)
Sodium: 141 mmol/L (ref 135–146)
Total Bilirubin: 0.2 mg/dL (ref 0.2–1.2)
Total Protein: 6.9 g/dL (ref 6.1–8.1)

## 2020-08-20 NOTE — Progress Notes (Signed)
[name],   Serum iron is low. Any blood in stool? Black tarry stools? Are you taking any iron supplements?   Thyroid MUCH better than 6 months ago. You should be feeling much better on that front.

## 2020-08-25 ENCOUNTER — Encounter: Payer: Self-pay | Admitting: Neurology

## 2020-08-27 ENCOUNTER — Ambulatory Visit: Payer: Medicare HMO | Admitting: Internal Medicine

## 2020-08-27 NOTE — Progress Notes (Deleted)
Office Visit Note  Patient: [name]             Date of Birth: 1950/10/14           MRN: [id number]             PCP: Lavada Mesi Referring: Lavada Mesi Visit Date: 08/27/2020 Occupation: _0 @  Subjective:  No chief complaint on file.   History of Present Illness: [name] is a 70 y.o. female ***   Activities of Daily Living:  Patient reports morning stiffness for *** {minute/hour:19697}.   Patient {ACTIONS;DENIES/REPORTS:21021675::"Denies"} nocturnal pain.  Difficulty dressing/grooming: {ACTIONS;DENIES/REPORTS:21021675::"Denies"} Difficulty climbing stairs: {ACTIONS;DENIES/REPORTS:21021675::"Denies"} Difficulty getting out of chair: {ACTIONS;DENIES/REPORTS:21021675::"Denies"} Difficulty using hands for taps, buttons, cutlery, and/or writing: {ACTIONS;DENIES/REPORTS:21021675::"Denies"}  No Rheumatology ROS completed.   PMFS History:  Patient Active Problem List   Diagnosis Date Noted  . Declined smoking cessation 08/05/2020  . Polypharmacy 08/04/2020  . Chronic upper neck pain 12/18/2019  . Left lumbar radiculopathy 11/05/2019  . Arthralgia of lower leg 10/22/2019  . Bilateral hand swelling 07/16/2019  . Bilateral hand pain 07/16/2019  . SIRS (systemic inflammatory response syndrome) ([place]) 06/04/2019  . Sepsis ([place]) 06/04/2019  . Excessive sleepiness 05/15/2019  . No appetite 05/15/2019  . Fatigue 12/11/2018  . Fixed pupil of both eyes 07/18/2018  . Frequent headaches 07/18/2018  . Unsteady gait 07/18/2018  . Primary osteoarthritis 07/14/2018  . Gastroesophageal reflux disease with esophagitis 07/14/2018  . Vitamin D deficiency 04/05/2018  . Primary insomnia 04/05/2018  . Hiatal hernia 04/05/2018  . Vasovagal syncope 12/16/2017  . History of diverticulitis 11/21/2017  . Non-intractable vomiting with nausea 11/21/2017  . Left lower quadrant guarding 11/21/2017  . Generalized abdominal pain 11/21/2017  . Positive colorectal  cancer screening using Cologuard test 10/07/2017  . Microalbuminuria 10/07/2017  . Weakness of both lower extremities 10/07/2017  . Bilateral cataracts 09/07/2017  . Dry eye syndrome of bilateral lacrimal glands 09/07/2017  . Moderate episode of recurrent major depressive disorder ([place]) 07/24/2017  . Bilateral calf pain 07/24/2017  . Memory changes 07/24/2017  . Abnormal finding on GI tract imaging 05/25/2017  . Lumbar degenerative disc disease 05/13/2017  . Common bile duct dilatation 04/27/2017  . Left upper arm swelling secondary to rotator cuff tear and fluid tracking down biceps tendon sheath 04/14/2017  . Anxiety 03/12/2017  . Rotator cuff tendonitis, left 03/10/2017  . Myocardial infarct ([place]) 03/09/2017  . CAD (coronary artery disease) 03/09/2017  . RA (rheumatoid arthritis) ([place]) 07/06/2016  . Atherosclerosis of native coronary artery of native heart without angina pectoris 03/16/2016  . Chronic back pain 02/27/2016  . Acquired hypothyroidism 12/01/2015  . COPD (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 ([place]) 03/05/2015  . Hyperlipidemia 01/22/2015  . COLD (chronic obstructive lung disease) ([place]) 12/06/2013  . Acute pain of left shoulder 09/23/2013  . Ganglion cyst of wrist 07/13/2013  . Anxiety state 05/16/2013  . Trochanteric bursitis of right hip 05/04/2013  . Diabetes type 2, controlled ([place]) 03/14/2013  . Hypothyroidism 03/14/2013  . Sacroiliitis ([place]) 09/14/2011  . Chronic headache 04/03/2011  . Tobacco use 04/03/2011  . Chronic pain 04/03/2011  . Benign hypertension 04/03/2011  . Allergic rhinitis 10/06/2010  . HYPERCALCEMIA 11/13/2009  . ARTHRITIS, ACROMIOCLAVICULAR 07/31/2008  . SPINAL STENOSIS IN CERVICAL REGION 07/31/2008  . Osteoporosis 10/17/2007  . Mixed hyperlipidemia 09/28/2007  . MDD (major depressive disorder), recurrent episode, mild ([place]) 09/28/2007  . FATTY LIVER DISEASE 09/28/2007  . Elevated liver function tests  09/28/2007   . Chronic pain syndrome 06/05/2007    Past Medical History:  Diagnosis Date  . CAD (coronary artery disease)   . Cervicalgia   . Chronic pain   . COPD (chronic obstructive pulmonary disease) ([place])   . DDD (degenerative disc disease), lumbar   . Diabetes mellitus   . Fatty liver   . Hyperlipidemia   . Narcotic abuse ([place])    history  . Osteoarthritis   . Osteoporosis   . Rheumatoid arthritis ([place])   . Thyroid disease    hypothyroid    Family History  Problem Relation Age of Onset  . Heart disease Mother 26       MI  . Hyperlipidemia Mother   . Rheum arthritis Mother   . Lymphoma Mother   . Heart disease Father 36       MI  . Diabetes Father   . Rheum arthritis Father   . Cancer Maternal Grandmother        hodgkins lympoma  . Cirrhosis Sister        primary biliary   Past Surgical History:  Procedure Laterality Date  . BREAST BIOPSY    . BREAST EXCISIONAL BIOPSY    . BREAST SURGERY     braest lump/ benign  . CHOLECYSTECTOMY    . LUMBAR LAMINECTOMY  05/2010   Dr. Rushie Nyhan  . TOTAL KNEE ARTHROPLASTY  2010   right   Social History   Social History Narrative  . Not on file   Immunization History  Administered Date(s) Administered  . H1N1 07/03/2008  . Influenza Split 03/05/2011, 06/14/2012  . Influenza Whole 03/14/2008  . Influenza, High Dose Seasonal PF 05/28/2016, 03/08/2018  . Influenza,inj,Quad PF,6+ Mos 03/14/2013, 04/02/2014, 06/07/2017, 02/12/2019  . Influenza-Unspecified 01/30/2020  . Janssen (J&J) SARS-COV-2 Vaccination 09/08/2019  . MMR 10/26/2017  . PFIZER(Purple Top)SARS-COV-2 Vaccination 03/29/2020  . Pneumococcal Conjugate-13 09/21/2013, 07/19/2017  . Pneumococcal Polysaccharide-23 05/31/2001, 02/12/2019  . Td 10/17/2007  . Zoster 12/08/2010  . Zoster Recombinat (Shingrix) 03/25/2020     Objective: Vital Signs: There were no vitals taken for this visit.   Physical Exam   Musculoskeletal Exam: ***  CDAI Exam: CDAI Score:  -- Patient Global: --; Provider Global: -- Swollen: --; Tender: -- Joint Exam 08/27/2020   No joint exam has been documented for this visit   There is currently no information documented on the homunculus. Go to the Rheumatology activity and complete the homunculus joint exam.  Investigation: No additional findings.  Imaging: No results found.  Recent Labs: Lab Results  Component Value Date   WBC 8.6 08/19/2020   HGB 13.1 08/19/2020   PLT 280 08/19/2020   NA 141 08/19/2020   K 4.0 08/19/2020   CL 106 08/19/2020   CO2 26 08/19/2020   GLUCOSE 194 (H) 08/19/2020   BUN 14 08/19/2020   CREATININE 0.68 08/19/2020   BILITOT 0.2 08/19/2020   ALKPHOS 193 (H) 12/10/2014   AST 11 08/19/2020   ALT 9 08/19/2020   PROT 6.9 08/19/2020   ALBUMIN 4.1 12/10/2014   CALCIUM 9.5 08/19/2020   GFRAA 102 08/19/2020    Speciality Comments: No specialty comments available.  Procedures:  No procedures performed Allergies: Codeine, Naproxen, Budesonide-formoterol fumarate, Duloxetine hcl, Fentanyl, Tramadol-acetaminophen, Effexor xr [venlafaxine hcl er], Fluticasone-salmeterol, and Lisinopril   Assessment / Plan:     Visit Diagnoses: No diagnosis found.  Orders: No orders of the defined types were placed in this encounter.  No orders of the defined types  were placed in this encounter.   Face-to-face time spent with patient was *** minutes. Greater than 50% of time was spent in counseling and coordination of care.  Follow-Up Instructions: No follow-ups on file.   Collier Salina, MD  Note - This record has been created using [employer].  Chart creation errors have been sought, but may not always  have been located. Such creation errors do not reflect on  the standard of medical care.

## 2020-08-28 ENCOUNTER — Ambulatory Visit: Payer: Medicare HMO | Admitting: Internal Medicine

## 2020-08-29 ENCOUNTER — Telehealth (INDEPENDENT_AMBULATORY_CARE_PROVIDER_SITE_OTHER): Payer: Medicare HMO | Admitting: Physician Assistant

## 2020-08-29 DIAGNOSIS — Z5329 Procedure and treatment not carried out because of patient's decision for other reasons: Secondary | ICD-10-CM

## 2020-08-29 DIAGNOSIS — Z91199 Patient's noncompliance with other medical treatment and regimen due to unspecified reason: Secondary | ICD-10-CM

## 2020-08-29 NOTE — Progress Notes (Signed)
No answer.  Called 3 times.  LM to call back.

## 2020-09-01 ENCOUNTER — Telehealth: Payer: Self-pay | Admitting: Neurology

## 2020-09-01 NOTE — Telephone Encounter (Signed)
LVM for patient to call back to get this appt scheduled. AM 

## 2020-09-01 NOTE — Telephone Encounter (Signed)
Patient left vm stating she was sick and wanted antibiotic sent in.   No showed appt on Friday. She will need a visit. Please call her to reschedule this visit if she wants to discuss her symptoms and get treatment. [phone].

## 2020-09-04 ENCOUNTER — Telehealth: Payer: Self-pay | Admitting: Neurology

## 2020-09-04 NOTE — Telephone Encounter (Signed)
Patient left vm wanting contact information for Rheumatologist.  [facility] [phone] Called and left this information on patient's vm, phone goes straight to vm.

## 2020-09-06 DIAGNOSIS — G4700 Insomnia, unspecified: Secondary | ICD-10-CM | POA: Diagnosis not present

## 2020-09-06 DIAGNOSIS — E039 Hypothyroidism, unspecified: Secondary | ICD-10-CM | POA: Diagnosis not present

## 2020-09-06 DIAGNOSIS — E1142 Type 2 diabetes mellitus with diabetic polyneuropathy: Secondary | ICD-10-CM | POA: Diagnosis not present

## 2020-09-06 DIAGNOSIS — G8929 Other chronic pain: Secondary | ICD-10-CM | POA: Diagnosis not present

## 2020-09-06 DIAGNOSIS — E785 Hyperlipidemia, unspecified: Secondary | ICD-10-CM | POA: Diagnosis not present

## 2020-09-06 DIAGNOSIS — I2510 Atherosclerotic heart disease of native coronary artery without angina pectoris: Secondary | ICD-10-CM | POA: Diagnosis not present

## 2020-09-06 DIAGNOSIS — J449 Chronic obstructive pulmonary disease, unspecified: Secondary | ICD-10-CM | POA: Diagnosis not present

## 2020-09-06 DIAGNOSIS — R69 Illness, unspecified: Secondary | ICD-10-CM | POA: Diagnosis not present

## 2020-09-06 DIAGNOSIS — M0550 Rheumatoid polyneuropathy with rheumatoid arthritis of unspecified site: Secondary | ICD-10-CM | POA: Diagnosis not present

## 2020-09-10 ENCOUNTER — Other Ambulatory Visit: Payer: Self-pay | Admitting: Physician Assistant

## 2020-09-19 ENCOUNTER — Telehealth: Payer: Self-pay | Admitting: Neurology

## 2020-09-19 NOTE — Telephone Encounter (Signed)
Patient left vm asking that her pain referral be sent to [facility]. She has one in the chart that was sent to [facility], she wants this location instead. Please send.

## 2020-09-24 NOTE — Telephone Encounter (Signed)
I sent referral to [facility] as patient requested and received a fax stating they denied due to they didn't think patient was a good fit for their office. I called and left a voicemail letting her know they denied the referral. - CF

## 2020-10-03 ENCOUNTER — Ambulatory Visit: Payer: Medicare HMO | Admitting: Internal Medicine

## 2020-10-03 NOTE — Progress Notes (Deleted)
Office Visit Note  Patient: [name]             Date of Birth: [birth year]           MRN: [id number]             PCP: Lavada Mesi Referring: Lavada Mesi Visit Date: 10/03/2020 Occupation: '@GUAROCC' @  Subjective:  No chief complaint on file.   History of Present Illness: [name] is a 70 y.o. female here for rheumatoid arthritis. She was previously a patient with [facility] and treated with methotrexate and hydroxychloroquine.***   Labs reviewed 07/2017 RF 81.6  Activities of Daily Living:  Patient reports morning stiffness for *** {minute/hour:19697}.   Patient {ACTIONS;DENIES/REPORTS:21021675::"Denies"} nocturnal pain.  Difficulty dressing/grooming: {ACTIONS;DENIES/REPORTS:21021675::"Denies"} Difficulty climbing stairs: {ACTIONS;DENIES/REPORTS:21021675::"Denies"} Difficulty getting out of chair: {ACTIONS;DENIES/REPORTS:21021675::"Denies"} Difficulty using hands for taps, buttons, cutlery, and/or writing: {ACTIONS;DENIES/REPORTS:21021675::"Denies"}  No Rheumatology ROS completed.   PMFS History:  Patient Active Problem List   Diagnosis Date Noted  . Declined smoking cessation 08/05/2020  . Polypharmacy 08/04/2020  . Chronic upper neck pain 12/18/2019  . Left lumbar radiculopathy 11/05/2019  . Arthralgia of lower leg 10/22/2019  . Bilateral hand swelling 07/16/2019  . Bilateral hand pain 07/16/2019  . SIRS (systemic inflammatory response syndrome) ([place]) 06/04/2019  . Sepsis ([employer]) 06/04/2019  . Excessive sleepiness 05/15/2019  . No appetite 05/15/2019  . Fatigue 12/11/2018  . Fixed pupil of both eyes 07/18/2018  . Frequent headaches 07/18/2018  . Unsteady gait 07/18/2018  . Primary osteoarthritis 07/14/2018  . Gastroesophageal reflux disease with esophagitis 07/14/2018  . Vitamin D deficiency 04/05/2018  . Primary insomnia 04/05/2018  . Hiatal hernia 04/05/2018  . Vasovagal syncope 12/16/2017  . History of diverticulitis  11/21/2017  . Non-intractable vomiting with nausea 11/21/2017  . Left lower quadrant guarding 11/21/2017  . Generalized abdominal pain 11/21/2017  . Positive colorectal cancer screening using Cologuard test 10/07/2017  . Microalbuminuria 10/07/2017  . Weakness of both lower extremities 10/07/2017  . Bilateral cataracts 09/07/2017  . Dry eye syndrome of bilateral lacrimal glands 09/07/2017  . Moderate episode of recurrent major depressive disorder ([place]) 07/24/2017  . Bilateral calf pain 07/24/2017  . Memory changes 07/24/2017  . Abnormal finding on GI tract imaging 05/25/2017  . Lumbar degenerative disc disease 05/13/2017  . Common bile duct dilatation 04/27/2017  . Left upper arm swelling secondary to rotator cuff tear and fluid tracking down biceps tendon sheath 04/14/2017  . Anxiety 03/12/2017  . Rotator cuff tendonitis, left 03/10/2017  . Myocardial infarct ([place]) 03/09/2017  . CAD (coronary artery disease) 03/09/2017  . RA (rheumatoid arthritis) ([place]) 07/06/2016  . Atherosclerosis of native coronary artery of native heart without angina pectoris 03/16/2016  . Chronic back pain 02/27/2016  . Acquired hypothyroidism 12/01/2015  . COPD (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 ([place]) 03/05/2015  . Hyperlipidemia 01/22/2015  . COLD (chronic obstructive lung disease) ([place]) 12/06/2013  . Acute pain of left shoulder 09/23/2013  . Ganglion cyst of wrist 07/13/2013  . Anxiety state 05/16/2013  . Trochanteric bursitis of right hip 05/04/2013  . Diabetes type 2, controlled ([place]) 03/14/2013  . Hypothyroidism 03/14/2013  . Sacroiliitis ([place]) 09/14/2011  . Chronic headache 04/03/2011  . Tobacco use 04/03/2011  . Chronic pain 04/03/2011  . Benign hypertension 04/03/2011  . Allergic rhinitis 10/06/2010  . HYPERCALCEMIA 11/13/2009  . ARTHRITIS, ACROMIOCLAVICULAR 07/31/2008  . SPINAL STENOSIS IN CERVICAL REGION 07/31/2008  . Osteoporosis 10/17/2007  . Mixed  hyperlipidemia  09/28/2007  . MDD (major depressive disorder), recurrent episode, mild ([place]) 09/28/2007  . FATTY LIVER DISEASE 09/28/2007  . Elevated liver function tests 09/28/2007  . Chronic pain syndrome 06/05/2007    Past Medical History:  Diagnosis Date  . CAD (coronary artery disease)   . Cervicalgia   . Chronic pain   . COPD (chronic obstructive pulmonary disease) ([place])   . DDD (degenerative disc disease), lumbar   . Diabetes mellitus   . Fatty liver   . Hyperlipidemia   . Narcotic abuse ([place])    history  . Osteoarthritis   . Osteoporosis   . Rheumatoid arthritis ([place])   . Thyroid disease    hypothyroid    Family History  Problem Relation Age of Onset  . Heart disease Mother 59       MI  . Hyperlipidemia Mother   . Rheum arthritis Mother   . Lymphoma Mother   . Heart disease Father 64       MI  . Diabetes Father   . Rheum arthritis Father   . Cancer Maternal Grandmother        hodgkins lympoma  . Cirrhosis Sister        primary biliary   Past Surgical History:  Procedure Laterality Date  . BREAST BIOPSY    . BREAST EXCISIONAL BIOPSY    . BREAST SURGERY     braest lump/ benign  . CHOLECYSTECTOMY    . LUMBAR LAMINECTOMY  05/2010   Dr. Rushie Nyhan  . TOTAL KNEE ARTHROPLASTY  2010   right   Social History   Social History Narrative  . Not on file   Immunization History  Administered Date(s) Administered  . H1N1 07/03/2008  . Influenza Split 03/05/2011, 06/14/2012  . Influenza Whole 03/14/2008  . Influenza, High Dose Seasonal PF 05/28/2016, 03/08/2018  . Influenza,inj,Quad PF,6+ Mos 03/14/2013, 04/02/2014, 06/07/2017, 02/12/2019  . Influenza-Unspecified 01/30/2020  . Janssen (J&J) SARS-COV-2 Vaccination 09/08/2019  . MMR 10/26/2017  . PFIZER(Purple Top)SARS-COV-2 Vaccination 03/29/2020  . Pneumococcal Conjugate-13 09/21/2013, 07/19/2017  . Pneumococcal Polysaccharide-23 05/31/2001, 02/12/2019  . Td 10/17/2007  . Zoster 12/08/2010  . Zoster  Recombinat (Shingrix) 03/25/2020     Objective: Vital Signs: There were no vitals taken for this visit.   Physical Exam   Musculoskeletal Exam: ***  CDAI Exam: CDAI Score: -- Patient Global: --; Provider Global: -- Swollen: --; Tender: -- Joint Exam 10/03/2020   No joint exam has been documented for this visit   There is currently no information documented on the homunculus. Go to the Rheumatology activity and complete the homunculus joint exam.  Investigation: No additional findings.  Imaging: No results found.  Recent Labs: Lab Results  Component Value Date   WBC 8.6 08/19/2020   HGB 13.1 08/19/2020   PLT 280 08/19/2020   NA 141 08/19/2020   K 4.0 08/19/2020   CL 106 08/19/2020   CO2 26 08/19/2020   GLUCOSE 194 (H) 08/19/2020   BUN 14 08/19/2020   CREATININE 0.68 08/19/2020   BILITOT 0.2 08/19/2020   ALKPHOS 193 (H) 12/10/2014   AST 11 08/19/2020   ALT 9 08/19/2020   PROT 6.9 08/19/2020   ALBUMIN 4.1 12/10/2014   CALCIUM 9.5 08/19/2020   GFRAA 102 08/19/2020    Speciality Comments: No specialty comments available.  Procedures:  No procedures performed Allergies: Codeine, Naproxen, Budesonide-formoterol fumarate, Duloxetine hcl, Fentanyl, Tramadol-acetaminophen, Effexor xr [venlafaxine hcl er], Fluticasone-salmeterol, and Lisinopril   Assessment / Plan:     Visit Diagnoses:  No diagnosis found.  Orders: No orders of the defined types were placed in this encounter.  No orders of the defined types were placed in this encounter.   Face-to-face time spent with patient was *** minutes. Greater than 50% of time was spent in counseling and coordination of care.  Follow-Up Instructions: No follow-ups on file.   Collier Salina, MD  Note - This record has been created using [employer].  Chart creation errors have been sought, but may not always  have been located. Such creation errors do not reflect on  the standard of medical care.

## 2020-10-08 ENCOUNTER — Other Ambulatory Visit: Payer: Self-pay | Admitting: Physician Assistant

## 2020-10-10 NOTE — Telephone Encounter (Signed)
Melissa Asp do we have any other options.

## 2020-10-10 NOTE — Telephone Encounter (Signed)
Patient left vm stating she would like a recommendation for another pain clinic referral. Please advise.

## 2020-10-13 ENCOUNTER — Other Ambulatory Visit: Payer: Self-pay | Admitting: Physician Assistant

## 2020-10-13 ENCOUNTER — Telehealth: Payer: Self-pay

## 2020-10-13 DIAGNOSIS — R0789 Other chest pain: Secondary | ICD-10-CM | POA: Diagnosis not present

## 2020-10-13 NOTE — Telephone Encounter (Signed)
[name] states she has chest pain. Denies shortness of breath, dizziness, vision problems or sweating. Advised her to go to the ED to be evaluated due to history of heart attack.

## 2020-10-13 NOTE — Telephone Encounter (Signed)
Last sent in April, missed Rheumatology appt. Are you going to keep prescribing? Didn't want to sign without your review.

## 2020-10-13 NOTE — Telephone Encounter (Signed)
Agree with plan 

## 2020-10-15 NOTE — Telephone Encounter (Signed)
She definitely will not accept. We could call the patient and ask her for options. We need a list of the ones she has tried. She also needs to be seeing rheumatology. Does she have a plan for another rheumatologist?

## 2020-10-17 ENCOUNTER — Other Ambulatory Visit: Payer: Self-pay | Admitting: Physician Assistant

## 2020-10-17 DIAGNOSIS — M5136 Other intervertebral disc degeneration, lumbar region: Secondary | ICD-10-CM

## 2020-10-20 ENCOUNTER — Ambulatory Visit: Payer: Medicare HMO | Admitting: Physician Assistant

## 2020-10-21 ENCOUNTER — Telehealth: Payer: Self-pay | Admitting: Neurology

## 2020-10-21 NOTE — Telephone Encounter (Signed)
Patient left vm asking if she should get a Covid booster. LMOM letting patient know since her last vaccine was in October it would be time for her to get another booster shot. She is to call with any questions.

## 2020-10-24 ENCOUNTER — Telehealth: Payer: Self-pay | Admitting: Neurology

## 2020-10-24 NOTE — Telephone Encounter (Signed)
Patient left another vm asking for a new referral to Rheumatology. She was fired from last Rheumatologist for no shows, she was referred to a new office and didn't show up. She is asking for a new referral. Anywhere else to send her?

## 2020-10-28 NOTE — Telephone Encounter (Signed)
Cindy,   Can you help to see if anywhere to refer too.

## 2020-11-06 ENCOUNTER — Telehealth: Payer: Self-pay | Admitting: Neurology

## 2020-11-06 NOTE — Telephone Encounter (Signed)
Left VM for patient to call back to get this appt scheduled. AM

## 2020-11-06 NOTE — Telephone Encounter (Signed)
Patient left vm stating she is wondering if she needs labs. She is still having a lot of fatigue. She had some abnormal labs 3 months ago and we were never able to reach her to discuss.   Patient is due for 3 month follow up. Can we call and schedule her for appt so she can speak with [facility] about symptoms/follow up.

## 2020-11-13 ENCOUNTER — Other Ambulatory Visit: Payer: Self-pay | Admitting: Physician Assistant

## 2020-11-13 NOTE — Telephone Encounter (Signed)
Have left several messages that patient needs appt. Please review.

## 2020-11-18 ENCOUNTER — Telehealth: Payer: Self-pay | Admitting: *Deleted

## 2020-11-18 NOTE — Chronic Care Management (AMB) (Signed)
  Chronic Care Management   Note  11/18/2020 Name: [name] MRN: [id number] DOB: [birth year]  [name] is a 70 y.o. year old female who is a primary care patient of Donella Stade, [place]. I reached out to [name] by phone today in response to a referral sent by Ms. Harshini L Whiteman's PCP, Alden Hipp, Jade L, PA-C.      [name] was given information about Chronic Care Management services today including:  CCM service includes personalized support from designated clinical staff supervised by her physician, including individualized plan of care and coordination with other care providers 24/7 contact phone numbers for assistance for urgent and routine care needs. Service will only be billed when office clinical staff spend 20 minutes or more in a month to coordinate care. Only one practitioner may furnish and bill the service in a calendar month. The patient may stop CCM services at any time (effective at the end of the month) by phone call to the office staff. The patient will be responsible for cost sharing (co-pay) of up to 20% of the service fee (after annual deductible is met).  Patient agreed to services and verbal consent obtained.   Follow up plan: Telephone appointment with care management team member scheduled for: 11/26/2020  [facility]

## 2020-11-19 ENCOUNTER — Telehealth (INDEPENDENT_AMBULATORY_CARE_PROVIDER_SITE_OTHER): Payer: Medicare HMO | Admitting: Physician Assistant

## 2020-11-19 DIAGNOSIS — Z5329 Procedure and treatment not carried out because of patient's decision for other reasons: Secondary | ICD-10-CM

## 2020-11-19 NOTE — Progress Notes (Signed)
No show. Called 4 times and went straight to voicemail.

## 2020-11-25 ENCOUNTER — Encounter: Payer: Self-pay | Admitting: Physician Assistant

## 2020-11-25 ENCOUNTER — Telehealth (INDEPENDENT_AMBULATORY_CARE_PROVIDER_SITE_OTHER): Payer: Medicare HMO | Admitting: Physician Assistant

## 2020-11-25 DIAGNOSIS — E119 Type 2 diabetes mellitus without complications: Secondary | ICD-10-CM

## 2020-11-25 DIAGNOSIS — G3184 Mild cognitive impairment, so stated: Secondary | ICD-10-CM | POA: Diagnosis not present

## 2020-11-25 DIAGNOSIS — R410 Disorientation, unspecified: Secondary | ICD-10-CM | POA: Insufficient documentation

## 2020-11-25 DIAGNOSIS — E039 Hypothyroidism, unspecified: Secondary | ICD-10-CM | POA: Diagnosis not present

## 2020-11-25 DIAGNOSIS — G4710 Hypersomnia, unspecified: Secondary | ICD-10-CM | POA: Diagnosis not present

## 2020-11-25 DIAGNOSIS — R413 Other amnesia: Secondary | ICD-10-CM

## 2020-11-25 DIAGNOSIS — G894 Chronic pain syndrome: Secondary | ICD-10-CM | POA: Diagnosis not present

## 2020-11-25 DIAGNOSIS — M069 Rheumatoid arthritis, unspecified: Secondary | ICD-10-CM

## 2020-11-25 MED ORDER — ALPRAZOLAM 0.5 MG PO TABS: 0.5000 mg | ORAL_TABLET | Freq: Two times a day (BID) | ORAL | 0 refills | Status: DC | PRN

## 2020-11-25 MED ORDER — ALENDRONATE SODIUM 70 MG PO TABS: ORAL_TABLET | ORAL | 3 refills | Status: AC

## 2020-11-25 NOTE — Progress Notes (Signed)
Patient ID: [name], female   DOB: 11-Oct-1950, 70 y.o.   MRN: [id number]  .Marland KitchenVirtual Visit via Telephone Note  I connected with [name] on 11/25/2020 at  1:20 PM EDT by telephone and verified that I am speaking with the correct person using two identifiers.  Location: Patient: home Provider: clinic  .Marland KitchenParticipating in visit:  Patient: [name] Patient husband  Provider: Tandy Gaw PA-C   I discussed the limitations, risks, security and privacy concerns of performing an evaluation and management service by telephone and the availability of in person appointments. I also discussed with the patient that there may be a patient responsible charge related to this service. The patient expressed understanding and agreed to proceed.   History of Present Illness: Patient is a 70 year old female with hypertension, CAD with history of MI osteoporosis, COPD, MDD, anxiety rheumatoid arthritis, chronic pain due to osteoarthritis,type 2 diabetes, hypothyroidism who presents via telephone to discuss fatigue.  Ultimately for the past 6 months patient has just been very tired.  She sleeps about 16 hours a day.  She feels like she is taking her medicines but then also acts confused when you ask her about them.   Husband also got on to the phone.  He is very concerned about her cognition, confusion, memory changes.  He denies any known hallucinations or behavioral changes.  She cannot remember conversations.  She sleeps a lot.  She is not performing any activities of daily living such as cooking, cleaning or managing.  She is currently not driving.  He does not believe she is taking her medications as prescribed.   .. Active Ambulatory Problems    Diagnosis Date Noted   Mixed hyperlipidemia 09/28/2007   HYPERCALCEMIA 11/13/2009   MDD (major depressive disorder), recurrent episode, mild (HCC) 09/28/2007   Chronic pain syndrome 06/05/2007   FATTY LIVER DISEASE 09/28/2007   ARTHRITIS,  ACROMIOCLAVICULAR 07/31/2008   SPINAL STENOSIS IN CERVICAL REGION 07/31/2008   Osteoporosis 10/17/2007   Elevated liver function tests 09/28/2007   Allergic rhinitis 10/06/2010   Chronic headache 04/03/2011   Tobacco use 04/03/2011   Chronic pain 04/03/2011   Benign hypertension 04/03/2011   Sacroiliitis (HCC) 09/14/2011   Diabetes type 2, controlled (HCC) 03/14/2013   Hypothyroidism 03/14/2013   Trochanteric bursitis of right hip 05/04/2013   Anxiety state 05/16/2013   Ganglion cyst of wrist 07/13/2013   Acute pain of left shoulder 09/23/2013   COLD (chronic obstructive lung disease) (HCC) 12/06/2013   Hyperlipidemia 01/22/2015   COPD (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) 03/05/2015   Myocardial infarct (HCC) 03/09/2017   CAD (coronary artery disease) 03/09/2017   Rotator cuff tendonitis, left 03/10/2017   Anxiety 03/12/2017   Left upper arm swelling secondary to rotator cuff tear and fluid tracking down biceps tendon sheath 04/14/2017   Common bile duct dilatation 04/27/2017   Lumbar degenerative disc disease 05/13/2017   Abnormal finding on GI tract imaging 05/25/2017   Chronic back pain 02/27/2016   Atherosclerosis of native coronary artery of native heart without angina pectoris 03/16/2016   RA (rheumatoid arthritis) (HCC) 07/06/2016   Acquired hypothyroidism 12/01/2015   Moderate episode of recurrent major depressive disorder (HCC) 07/24/2017   Bilateral calf pain 07/24/2017   Mild cognitive impairment 07/24/2017   Bilateral cataracts 09/07/2017   Dry eye syndrome of bilateral lacrimal glands 09/07/2017   Positive colorectal cancer screening using Cologuard test 10/07/2017   Microalbuminuria 10/07/2017   Weakness of both lower extremities 10/07/2017  History of diverticulitis 11/21/2017   Non-intractable vomiting with nausea 11/21/2017   Left lower quadrant guarding 11/21/2017   Generalized abdominal pain 11/21/2017   Vasovagal syncope  12/16/2017   Vitamin D deficiency 04/05/2018   Primary insomnia 04/05/2018   Hiatal hernia 04/05/2018   Primary osteoarthritis 07/14/2018   Gastroesophageal reflux disease with esophagitis 07/14/2018   Fixed pupil of both eyes 07/18/2018   Frequent headaches 07/18/2018   Unsteady gait 07/18/2018   Fatigue 12/11/2018   Excessive sleepiness 05/15/2019   No appetite 05/15/2019   SIRS (systemic inflammatory response syndrome) (HCC) 06/04/2019   Sepsis (HCC) 06/04/2019   Bilateral hand swelling 07/16/2019   Bilateral hand pain 07/16/2019   Arthralgia of lower leg 10/22/2019   Left lumbar radiculopathy 11/05/2019   Chronic upper neck pain 12/18/2019   Polypharmacy 08/04/2020   Declined smoking cessation 08/05/2020   Confusion 11/25/2020   Resolved Ambulatory Problems    Diagnosis Date Noted   DM (diabetes mellitus) (HCC) 06/05/2007   HEMORRHOIDS, WITH BLEEDING 04/22/2008   Acute maxillary sinusitis 04/10/2010   Alcoholic fatty liver 12/24/2009   RENAL INSUFFICIENCY, ACUTE 10/23/2008   ROTATOR CUFF SYNDROME, RIGHT 04/01/2008   ALLERGIC REACTION 12/24/2009   ELEVATED BLOOD PRESSURE WITHOUT DIAGNOSIS OF HYPERTENSION 05/13/2010   CHRONIC OBSTRUCTIVE PULMONARY DISEASE, ACUTE EXACERBATION 08/14/2010   Recurrent acute sinusitis 09/09/2010   Allergic dermatitis due to poison ivy 11/27/2010   DERMATITIS, ATOPIC 01/15/2011   Arthritis of carpometacarpal joint 02/14/2012   Ganglion cyst of left foot 07/13/2013   Chronic pain syndrome 12/05/2013   Primary osteoarthritis of left wrist 12/10/2014   Rheumatoid arthritis involving both hands (HCC) 03/09/2017   Bulging lumbar disc 02/27/2016   Pyelonephritis 11/21/2017   Upper abdominal pain 03/10/2018   Patellar sleeve fracture, right, closed, initial encounter 04/24/2018   Past Medical History:  Diagnosis Date   Cervicalgia    COPD (chronic obstructive pulmonary disease) (HCC)    DDD (degenerative disc disease), lumbar    Diabetes  mellitus    Fatty liver    Narcotic abuse (HCC)    Osteoarthritis    Osteoporosis    Rheumatoid arthritis (HCC)    Thyroid disease     Observations/Objective: Pt seemed tired and confused.  Not clear if she is taking medication.   Pt husband: concerned.   .. MMSE - Mini Mental State Exam 11/12/2019 09/12/2019 07/14/2018  Orientation to time 5 4 5   Orientation to Place 5 5 5   Registration 3 3 3   Attention/ Calculation 5 4 3   Recall 3 3 3   Language- name 2 objects 2 2 2   Language- repeat 1 1 1   Language- follow 3 step command 3 3 3   Language- read & follow direction 1 1 1   Write a sentence 1 1 1   Copy design 1 1 1   Total score 30 28 28    .. 6CIT Screen 01/25/2018  What Year? 0 points  What month? 0 points  What time? 0 points  Count back from 20 0 points  Months in reverse 0 points  Repeat phrase 0 points  Total Score 0     Assessment and Plan:  Diagnoses and all orders for this visit:  Mild cognitive impairment  Memory changes  Excessive sleepiness  Confusion  Rheumatoid arthritis, involving unspecified site, unspecified whether rheumatoid factor present (HCC) -     COMPLETE METABOLIC PANEL WITH GFR  Chronic pain syndrome -     COMPLETE METABOLIC PANEL WITH GFR  Controlled type 2 diabetes mellitus without  complication, without long-term current use of insulin (HCC) -     CBC with Differential/Platelet -     COMPLETE METABOLIC PANEL WITH GFR -     Hemoglobin A1c  Acquired hypothyroidism -     Thyroid Panel With TSH -     COMPLETE METABOLIC PANEL WITH GFR  Other orders -     ALPRAZolam (XANAX) 0.5 MG tablet; Take 1 tablet (0.5 mg total) by mouth 2 (two) times daily as needed for anxiety. -     alendronate (FOSAMAX) 70 MG tablet; TAKE ONE TABLET EVERY 7 DAYS WITH A FULL GLASS OF WATER ON AN EMPTY STOMACH (Patient not taking: Reported on 11/26/2020)   Spoke with husband who is worried. Many components to treatment.  If she is not taking her medication  then he needs to take a more active role. Off and on medication can cause a lot of confusion.  We need to transition off xanax. Start decreasing. Sent new dose of xanax to the pharmacy. Need to repeat MMSE testing.  Stop trazodone since sleeping so much.  Get labs for fatigue.    Follow Up Instructions:    I discussed the assessment and treatment plan with the patient. The patient was provided an opportunity to ask questions and all were answered. The patient agreed with the plan and demonstrated an understanding of the instructions.   The patient was advised to call back or seek an in-person evaluation if the symptoms worsen or if the condition fails to improve as anticipated.  I provided 30 minutes of non-face-to-face time during this encounter.   Tandy Gaw, PA-C

## 2020-11-25 NOTE — Progress Notes (Signed)
Called at 1:20, went straight to VM.

## 2020-11-26 ENCOUNTER — Ambulatory Visit (INDEPENDENT_AMBULATORY_CARE_PROVIDER_SITE_OTHER): Payer: Medicare HMO

## 2020-11-26 ENCOUNTER — Telehealth: Payer: Medicare HMO

## 2020-11-26 DIAGNOSIS — E782 Mixed hyperlipidemia: Secondary | ICD-10-CM

## 2020-11-26 DIAGNOSIS — E119 Type 2 diabetes mellitus without complications: Secondary | ICD-10-CM | POA: Diagnosis not present

## 2020-11-26 DIAGNOSIS — I2510 Atherosclerotic heart disease of native coronary artery without angina pectoris: Secondary | ICD-10-CM | POA: Diagnosis not present

## 2020-11-26 DIAGNOSIS — I219 Acute myocardial infarction, unspecified: Secondary | ICD-10-CM

## 2020-11-26 NOTE — Patient Instructions (Addendum)
Visit Information   PATIENT GOALS:   Goals Addressed             This Visit's Progress    Monitor and Manage My Diabetes and Health Conditions       Timeframe:  Long-Range Goal Priority:  Medium Start Date: 11/26/20                            Expected End Date:                       Follow Up Date 12/10/20   Patient Goals: - attend provider visits as scheduled - obtain glucose meter and begin to check your blood sugar - check blood sugar per provider recommendation and as needed, if I feel it is too high or too low - take the blood sugar log and blood sugar meter to all doctor visits - Fall prevention strategies: change position slowly; keep walkways clear, have good lighting in the room, keep provider appointments, take medications as prescribed, maintain good muscle strength/tone. - call your provider if your condition does not improve or worsens and with questions or concerns - communicate with clinic pharmacist for assistance with management of health conditions   Why is this important?   Checking your blood sugar at home helps to keep it from getting very high or very low.  Writing the results in a diary or log helps the doctor know how to care for you.  Your blood sugar log should have the time, date and the results.  Also, write down the amount of insulin or other medicine that you take.  Other information, like what you ate, exercise done and how you were feeling, will also be helpful.     Notes:          Consent to CCM Services: [name] was given information about Chronic Care Management services today including:  CCM service includes personalized support from designated clinical staff supervised by her physician, including individualized plan of care and coordination with other care providers 24/7 contact phone numbers for assistance for urgent and routine care needs. Service will only be billed when office clinical staff spend 20 minutes or more in a month to  coordinate care. Only one practitioner may furnish and bill the service in a calendar month. The patient may stop CCM services at any time (effective at the end of the month) by phone call to the office staff. The patient will be responsible for cost sharing (co-pay) of up to 20% of the service fee (after annual deductible is met).  Patient agreed to services and verbal consent obtained.   Patient verbalizes understanding of instructions provided today and agrees to view in [place].   Telephone follow up appointment with care management team member scheduled for: 12/10/20 The patient has been provided with contact information for the care management team and has been advised to call with any health related questions or concerns.   Melissa Silversmith, RN, MSN, BSN, CCM Care Management Coordinator [facility] [phone]   CLINICAL CARE PLAN: Patient Care Plan: Diabetes Type 2 (Adult)     Problem Identified: Developement of long term care plan for management of diabetes in a patient with cardiovascular disease   Priority: Medium     Long-Range Goal: Disease Progression Prevented or Minimized   Start Date: 11/26/2020  Expected End Date: 05/08/2021  Priority: Medium  Note:   Objective:  Lab  Results  Component Value Date   HGBA1C 7.0 (A) 08/01/2020   Lab Results  Component Value Date   CREATININE 0.68 08/19/2020   CREATININE 0.74 07/17/2019   CREATININE 0.92 06/11/2019  Current Barriers:  Knowledge Deficits related to long term care plan for management of diabetes in a patient with HTN, CAD, history of MI, mild cognitive impairment. 70 yr old lives with husband. Client reports recent issues with falls. She states she is "dizzy and having a hard time walking". She reports falls x3 this week. PCP aware. She reports feeling tired. She states she has an appointment this Friday 11/28/20. She acknowledges some memory issues. Client reports she is not checking her blood sugar because  she does not have a glucose meter. She states her A1C is 8.0, however per chart last noted A1C 7.0 on 08/01/20. She states "I eat good" and denies eating foods high in sugar. Medication review completed. Client states she does not have some of the medications listed in chart and will need to get a refill.  Does not have glucometer to monitor blood sugar Cognitive Deficits client reports memory issues Does not adhere to prescribed medication regimen Case Manager Clinical Goal(s):  patient will demonstrate improved adherence to prescribed treatment plan for diabetes self care/management as evidenced by: monitoring of blood sugars per provider recommendation and adherence to prescribed medication regimen Interventions:  Collaboration with Donella Stade, PA-C regarding development and update of comprehensive plan of care as evidenced by provider attestation and co-signature Inter-disciplinary care team collaboration (see longitudinal plan of care) Reviewed medications with patient and discussed importance of medication adherence Discussed options for obtaining a meter: request prescription from PCP be sent to pharmacy or client may obtain over the counter glucose meter from drug store Discussed signs/symptoms of hypoglycemia and hyperglycemia. Provided patient with written educational materials related to hypo and hyperglycemia and importance of correct treatment Reviewed scheduled/upcoming provider appointments: Friday July 1 with PCP Referral made to embedded pharmacist for assistance with medication management; medication adherence Send education re: fall prevention Discussed plans with patient for ongoing care management follow up and provided patient with direct contact information for care management team Patient Goals: - attend provider visits as scheduled - obtain glucose meter and begin to check your blood sugar - check blood sugar per provider recommendation and as needed, if I feel it is  too high or too low - take the blood sugar log and blood sugar meter to all doctor visits - Fall prevention strategies: change position slowly; keep walkways clear, have good lighting in the room, keep provider appointments, take medications as prescribed, maintain good muscle strength/tone. - call your provider if your condition does not improve or worsens and with questions or concerns - communicate with clinic pharmacist for assistance with management of health conditions Follow Up Plan: Telephone follow up appointment with care management team member scheduled for: 12/10/20 The patient has been provided with contact information for the care management team and has been advised to call with any health related questions or concerns.       Understanding Your Risk for Falls Each year, millions of people have serious injuries from falls. It is important to understand your risk for falling. Talk with your health care provider about your risk and what you can do to lower it. There are actions you can take athome to lower your risk and prevent falls. If you do have a serious fall, make sure to tell your health care provider.Falling  once raises your risk of falling again. How can falls affect me? Serious injuries from falls are common. These include: Broken bones, such as hip fractures. Head injuries, such as traumatic brain injuries (TBI) or concussion. A fear of falling can cause you to avoid activities and stay at home. This canmake your muscles weaker and actually raise your risk for a fall. What can increase my risk? There are a number of risk factors that increase your risk for falling. The more risk factors you have, the higher your risk of falling. Serious injuries from a fall happen most often to people older than age 79. Children and youngadults ages 106-29 are also at higher risk. Common risk factors include: Weakness in the lower body. Lack (deficiency) of vitamin D. Being generally weak  or confused due to long-term (chronic) illness. Dizziness or balance problems. Poor vision. Medicines that cause dizziness or drowsiness. These can include medicines for your blood pressure, heart, anxiety, insomnia, or edema, as well as pain medicines and muscle relaxants. Other risk factors include: Drinking alcohol. Having had a fall in the past. Having depression. Having foot pain or wearing improper footwear. Working at a dangerous job. Having any of the following in your home: Tripping hazards, such as floor clutter or loose rugs. Poor lighting. Pets. Having dementia or memory loss. What actions can I take to lower my risk of falling?     Physical activity Maintain physical fitness. Do strength and balance exercises. Consider taking aregular class to build strength and balance. Yoga and tai chi are good options. Vision Have your eyes checked every year and your vision prescription updated asneeded. Walking aids and footwear Wear nonskid shoes. Do not wear high heels. Do not walk around the house in socks or slippers. Use a cane or walker as told by your health care provider. Home safety Attach secure railings on both sides of your stairs. Install grab bars for your tub, shower, and toilet. Use a bath mat in your tub or shower. Use good lighting in all rooms. Keep a flashlight near your bed. Make sure there is a clear path from your bed to the bathroom. Use night-lights. Do not use throw rugs. Make sure all carpeting is taped or tacked down securely. Remove all clutter from walkways and stairways, including extension cords. Repair uneven or broken steps. Avoid walking on icy or slippery surfaces. Walk on the grass instead of on icy or slick sidewalks. Use ice melt to get rid of ice on walkways. Use a cordless phone. Questions to ask your health care provider Can you help me check my risk for a fall? Do any of my medicines make me more likely to fall? Should I take a  vitamin D supplement? What exercises can I do to improve my strength and balance? Should I make an appointment to have my vision checked? Do I need a bone density test to check for weak bones or osteoporosis? Would it help to use a cane or a walker? Where to find more information Centers for Disease Control and Prevention, [employer]AD[employer]: http://www.wolf.info/ Community-Based Fall Prevention Programs: http://www.wolf.info/ National Institute on Aging: http://kim-miller.com/ Contact a health care provider if: You fall at home. You are afraid of falling at home. You feel weak, drowsy, or dizzy. Summary Serious injuries from a fall happen most often to people older than age 41. Children and young adults ages 51-29 are also at higher risk. Talk with your health care provider about your risks for falling and how to  lower those risks. Taking certain precautions at home can lower your risk for falling. If you fall, always tell your health care provider. This information is not intended to replace advice given to you by your health care provider. Make sure you discuss any questions you have with your healthcare provider. Document Revised: 12/19/2019 Document Reviewed: 12/19/2019 Elsevier Patient Education  2022 [place] and Fractures  Falls can be very serious, especially for older adults or people with osteoporosis  Falls can be caused by: Tripping or slipping Slow reflexes Balance problems Reduced muscle strength Poor vision or a recent change in prescription Illness and some medications (especially blood pressure pills, diuretics, heart medicines, muscle relaxants and sleep medications) Drinking alcohol  To prevent falls outdoors: Use a can or walker if needed Wear rubber-soled shoes so you don't slip DO NOT buy "shape up" shoes with rocker bottom soles if you have balance problems.  The thick soles and shape make it more difficult to keep your balance. Put kitty litter or salt on icy  sidewalks Walk on the grass if the sidewalks are slick Avoid walking on uneven ground whenever possible  T prevent falls indoors: Keep rooms clutter-free, especially hallways, stairs and paths to light switches Remove throw rugs Install night lights, especially to and in the bathroom Turn on lights before going downstairs Keep a flashlight next to your bed Buy a cordless phone to keep with you instead of jumping up to answer the phone Install grab bars in the bathroom near the shower and toilet Install rails on both sides of the stairs.  Make sure the stairs are well lit Wear slippers with non-skid soles.  Do not walk around in stockings or socks  Balance problems and dizziness are not a normal part of growing older.  If you begin having balance problems or dizziness see your doctor.  Physical Therapy can help you with many balance problems, strengthening hip and leg muscles and with gait training.  To keep your bones healthy make sure you are getting enough calcium and Vitamin D each day.  Ask your doctor or pharmacist about supplements.  Regular weight-bearing exercise like walking, lifting weights or dancing can help strengthen bones and prevent osteoporosis.

## 2020-11-26 NOTE — Chronic Care Management (AMB) (Signed)
Chronic Care Management   CCM RN Visit Note  11/26/2020 Name: [name] MRN: [id number] DOB: January 23, 1951  Subjective: [name] is a 70 y.o. year old female who is a primary care patient of Donella Stade, PA-C. The care management team was consulted for assistance with disease management and care coordination needs.    Engaged with patient by telephone for initial visit in response to provider referral for case management and/or care coordination services.   Consent to Services:  The patient was given the following information about Chronic Care Management services today, agreed to services, and gave verbal consent: 1. CCM service includes personalized support from designated clinical staff supervised by the primary care provider, including individualized plan of care and coordination with other care providers 2. 24/7 contact phone numbers for assistance for urgent and routine care needs. 3. Service will only be billed when office clinical staff spend 20 minutes or more in a month to coordinate care. 4. Only one practitioner may furnish and bill the service in a calendar month. 5.The patient may stop CCM services at any time (effective at the end of the month) by phone call to the office staff. 6. The patient will be responsible for cost sharing (co-pay) of up to 20% of the service fee (after annual deductible is met). Patient agreed to services and consent obtained.  Patient agreed to services and verbal consent obtained.   Assessment: Review of patient past medical history, allergies, medications, health status, including review of consultants reports, laboratory and other test data, was performed as part of comprehensive evaluation and provision of chronic care management services.   SDOH (Social Determinants of Health) assessments and interventions performed:  SDOH Interventions    Flowsheet Row Most Recent Value  SDOH Interventions   Food Insecurity Interventions Intervention  Not Indicated  Transportation Interventions Intervention Not Indicated        CCM Care Plan  Allergies  Allergen Reactions   Codeine Palpitations and Other (See Comments)   Naproxen Rash and Palpitations    GI upset   Budesonide-Formoterol Fumarate Rash    REACTION: rash REACTION: rash REACTION: rash   Duloxetine Hcl Other (See Comments)    No difference for pain or anti-depressant.  Other reaction(s): Other No difference for pain or anti-depressant. No difference for pain or anti-depressant.   Fentanyl Other (See Comments)    REACTION: severe aggitation, mood changes Other reaction(s): Hallucination, Psychiatric Hallucinations Hallucinations REACTION: severe aggitation, mood changes    Tramadol-Acetaminophen     Other reaction(s): Unknown   Effexor Xr [Venlafaxine Hcl Er]     Felt more anxious or no difference.    Fluticasone-Salmeterol    Lisinopril Rash    Maybe we dont really know    Outpatient Encounter Medications as of 11/26/2020  Medication Sig Note   ALPRAZolam (XANAX) 0.5 MG tablet Take 1 tablet (0.5 mg total) by mouth 2 (two) times daily as needed for anxiety.    atorvastatin (LIPITOR) 80 MG tablet Take 1 tablet (80 mg total) by mouth daily.    buPROPion (WELLBUTRIN XL) 150 MG 24 hr tablet TAKE 1 TABLET BY MOUTH EVERY DAY IN THE MORNING 11/26/2020: Reports takes sometimes, but not every day   desvenlafaxine (PRISTIQ) 100 MG 24 hr tablet Take 1 tablet (100 mg total) by mouth daily. 11/26/2020: Been taking off and on   gabapentin (NEURONTIN) 600 MG tablet TAKE 1 TABLET BY MOUTH THREE TIMES A DAY    levothyroxine (SYNTHROID) 150 MCG tablet  1 tab (150 mcg) by mouth every Sunday, Monday, Wednesday, Friday. 2 tabs (300 mcg) by mouth every Tuesday, Thursday, Saturday.    meloxicam (MOBIC) 15 MG tablet One tab PO qAM with a meal for 2 weeks, then daily prn pain.  (wait until after the prednisone to start this)    metFORMIN (GLUCOPHAGE) 1000 MG tablet Take 1 tablet  (1,000 mg total) by mouth 2 (two) times daily with a meal.    predniSONE (DELTASONE) 5 MG tablet TAKE 1 TABLET BY MOUTH EVERY DAY WITH BREAKFAST    Vitamin D, Ergocalciferol, (DRISDOL) 1.25 MG (50000 UNIT) CAPS capsule Take 1 capsule (50,000 Units total) by mouth every 7 (seven) days.    alendronate (FOSAMAX) 70 MG tablet TAKE ONE TABLET EVERY 7 DAYS WITH A FULL GLASS OF WATER ON AN EMPTY STOMACH (Patient not taking: Reported on 11/26/2020)    aspirin EC 81 MG tablet Take 81 mg by mouth daily.  (Patient not taking: Reported on 11/26/2020)    cetirizine (ZYRTEC) 10 MG tablet Take 10 mg by mouth daily. (Patient not taking: Reported on 11/26/2020)    diclofenac sodium (VOLTAREN) 1 % GEL Apply 4 g topically 4 (four) times daily. To affected joint. (Patient not taking: Reported on 11/26/2020)    famotidine (PEPCID) 20 MG tablet Take 1 tablet (20 mg total) by mouth 2 (two) times daily. (Patient not taking: Reported on 11/26/2020)    Fluticasone-Umeclidin-Vilant (TRELEGY ELLIPTA) 100-62.5-25 MCG/INH AEPB Take one puff once a day. (Patient not taking: Reported on 11/26/2020)    hydroxychloroquine (PLAQUENIL) 200 MG tablet Take one tablet by mouth daily. (Patient not taking: Reported on 11/26/2020)    methotrexate (RHEUMATREX) 15 MG tablet Take 1 tablet (15 mg total) by mouth once a week. Caution: Chemotherapy. Protect from light. (Patient not taking: Reported on 11/26/2020)    No facility-administered encounter medications on file as of 11/26/2020.    Patient Active Problem List   Diagnosis Date Noted   Confusion 11/25/2020   Declined smoking cessation 08/05/2020   Polypharmacy 08/04/2020   Chronic upper neck pain 12/18/2019   Left lumbar radiculopathy 11/05/2019   Arthralgia of lower leg 10/22/2019   Bilateral hand swelling 07/16/2019   Bilateral hand pain 07/16/2019   SIRS (systemic inflammatory response syndrome) ([place]) 06/04/2019   Sepsis ([place]) 06/04/2019   Excessive sleepiness 05/15/2019   No appetite  05/15/2019   Fatigue 12/11/2018   Fixed pupil of both eyes 07/18/2018   Frequent headaches 07/18/2018   Unsteady gait 07/18/2018   Primary osteoarthritis 07/14/2018   Gastroesophageal reflux disease with esophagitis 07/14/2018   Vitamin D deficiency 04/05/2018   Primary insomnia 04/05/2018   Hiatal hernia 04/05/2018   Vasovagal syncope 12/16/2017   History of diverticulitis 11/21/2017   Non-intractable vomiting with nausea 11/21/2017   Left lower quadrant guarding 11/21/2017   Generalized abdominal pain 11/21/2017   Positive colorectal cancer screening using Cologuard test 10/07/2017   Microalbuminuria 10/07/2017   Weakness of both lower extremities 10/07/2017   Bilateral cataracts 09/07/2017   Dry eye syndrome of bilateral lacrimal glands 09/07/2017   Moderate episode of recurrent major depressive disorder ([place]) 07/24/2017   Bilateral calf pain 07/24/2017   Mild cognitive impairment 07/24/2017   Abnormal finding on GI tract imaging 05/25/2017   Lumbar degenerative disc disease 05/13/2017   Common bile duct dilatation 04/27/2017   Left upper arm swelling secondary to rotator cuff tear and fluid tracking down biceps tendon sheath 04/14/2017   Anxiety 03/12/2017   Rotator cuff tendonitis,  left 03/10/2017   Myocardial infarct ([place]) 03/09/2017   CAD (coronary artery disease) 03/09/2017   RA (rheumatoid arthritis) ([place]) 07/06/2016   Atherosclerosis of native coronary artery of native heart without angina pectoris 03/16/2016   Chronic back pain 02/27/2016   Acquired hypothyroidism 12/01/2015   COPD (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 ([place]) 03/05/2015   Hyperlipidemia 01/22/2015   COLD (chronic obstructive lung disease) ([place]) 12/06/2013   Acute pain of left shoulder 09/23/2013   Ganglion cyst of wrist 07/13/2013   Anxiety state 05/16/2013   Trochanteric bursitis of right hip 05/04/2013   Diabetes type 2, controlled ([place]) 03/14/2013   Hypothyroidism  03/14/2013   Sacroiliitis ([place]) 09/14/2011   Chronic headache 04/03/2011   Tobacco use 04/03/2011   Chronic pain 04/03/2011   Benign hypertension 04/03/2011   Allergic rhinitis 10/06/2010   HYPERCALCEMIA 11/13/2009   ARTHRITIS, ACROMIOCLAVICULAR 07/31/2008   SPINAL STENOSIS IN CERVICAL REGION 07/31/2008   Osteoporosis 10/17/2007   Mixed hyperlipidemia 09/28/2007   MDD (major depressive disorder), recurrent episode, mild ([place]) 09/28/2007   FATTY LIVER DISEASE 09/28/2007   Elevated liver function tests 09/28/2007   Chronic pain syndrome 06/05/2007    Conditions to be addressed/monitored:CAD, HTN, DMII, and history of fall, cognitive impairment  Care Plan : Diabetes Type 2 (Adult)  Updates made by Luretha Rued, RN since 11/26/2020 12:00 AM     Problem: Developement of long term care plan for management of diabetes in a patient with cardiovascular disease   Priority: Medium     Long-Range Goal: Disease Progression Prevented or Minimized   Start Date: 11/26/2020  Expected End Date: 05/08/2021  Priority: Medium  Note:   Objective:  Lab Results  Component Value Date   HGBA1C 7.0 (A) 08/01/2020   Lab Results  Component Value Date   CREATININE 0.68 08/19/2020   CREATININE 0.74 07/17/2019   CREATININE 0.92 06/11/2019  Current Barriers:  Knowledge Deficits related to long term care plan for management of diabetes in a patient with HTN, CAD, history of MI, mild cognitive impairment. 70 yr old lives with husband. Client reports recent issues with falls. She states she is "dizzy and having a hard time walking". She reports falls x3 this week. PCP aware. She reports feeling tired. She states she has an appointment this Friday 11/28/20. She acknowledges some memory issues. Client reports she is not checking her blood sugar because she does not have a glucose meter. She states her A1C is 8.0, however per chart last noted A1C 7.0 on 08/01/20. She states "I eat good" and denies eating foods  high in sugar. Medication review completed. Client states she does not have some of the medications listed in chart and will need to get a refill.  Does not have glucometer to monitor blood sugar Cognitive Deficits client reports memory issues Does not adhere to prescribed medication regimen Case Manager Clinical Goal(s):  patient will demonstrate improved adherence to prescribed treatment plan for diabetes self care/management as evidenced by: monitoring of blood sugars per provider recommendation and adherence to prescribed medication regimen Interventions:  Collaboration with Donella Stade, PA-C regarding development and update of comprehensive plan of care as evidenced by provider attestation and co-signature Inter-disciplinary care team collaboration (see longitudinal plan of care) Reviewed medications with patient and discussed importance of medication adherence Discussed options for obtaining a meter: request prescription from PCP be sent to pharmacy or client may obtain over the counter glucose meter from drug store Discussed signs/symptoms of hypoglycemia and hyperglycemia.  Provided patient with written educational materials related to hypo and hyperglycemia and importance of correct treatment Reviewed scheduled/upcoming provider appointments: Friday July 1 with PCP Referral made to embedded pharmacist for assistance with medication management; medication adherence Send education re: fall prevention Discussed plans with patient for ongoing care management follow up and provided patient with direct contact information for care management team Patient Goals: - attend provider visits as scheduled - obtain glucose meter and begin to check your blood sugar - check blood sugar per provider recommendation and as needed, if I feel it is too high or too low - take the blood sugar log and blood sugar meter to all doctor visits - Fall prevention strategies: change position slowly; keep  walkways clear, have good lighting in the room, keep provider appointments, take medications as prescribed, maintain good muscle strength/tone. - call your provider if your condition does not improve or worsens and with questions or concerns - communicate with clinic pharmacist for assistance with management of health conditions Follow Up Plan: Telephone follow up appointment with care management team member scheduled for: 12/10/20 The patient has been provided with contact information for the care management team and has been advised to call with any health related questions or concerns.       Plan:Telephone follow up appointment with care management team member scheduled for:  12/10/20 and The patient has been provided with contact information for the care management team and has been advised to call with any health related questions or concerns.   Thea Silversmith, RN, MSN, BSN, CCM Care Management Coordinator [facility] [phone]

## 2020-11-28 ENCOUNTER — Ambulatory Visit (INDEPENDENT_AMBULATORY_CARE_PROVIDER_SITE_OTHER): Payer: Medicare HMO | Admitting: Physician Assistant

## 2020-11-28 ENCOUNTER — Other Ambulatory Visit: Payer: Self-pay

## 2020-11-28 ENCOUNTER — Encounter: Payer: Self-pay | Admitting: Physician Assistant

## 2020-11-28 VITALS — BP 131/68 | HR 95 | Wt 130.0 lb

## 2020-11-28 DIAGNOSIS — G4710 Hypersomnia, unspecified: Secondary | ICD-10-CM | POA: Diagnosis not present

## 2020-11-28 DIAGNOSIS — E039 Hypothyroidism, unspecified: Secondary | ICD-10-CM | POA: Diagnosis not present

## 2020-11-28 DIAGNOSIS — R195 Other fecal abnormalities: Secondary | ICD-10-CM | POA: Diagnosis not present

## 2020-11-28 DIAGNOSIS — Z9114 Patient's other noncompliance with medication regimen: Secondary | ICD-10-CM | POA: Diagnosis not present

## 2020-11-28 DIAGNOSIS — R5382 Chronic fatigue, unspecified: Secondary | ICD-10-CM

## 2020-11-28 DIAGNOSIS — E119 Type 2 diabetes mellitus without complications: Secondary | ICD-10-CM | POA: Diagnosis not present

## 2020-11-28 DIAGNOSIS — G3184 Mild cognitive impairment, so stated: Secondary | ICD-10-CM | POA: Diagnosis not present

## 2020-11-28 DIAGNOSIS — R29898 Other symptoms and signs involving the musculoskeletal system: Secondary | ICD-10-CM | POA: Diagnosis not present

## 2020-11-28 DIAGNOSIS — E538 Deficiency of other specified B group vitamins: Secondary | ICD-10-CM | POA: Diagnosis not present

## 2020-11-28 DIAGNOSIS — M0579 Rheumatoid arthritis with rheumatoid factor of multiple sites without organ or systems involvement: Secondary | ICD-10-CM | POA: Diagnosis not present

## 2020-11-28 DIAGNOSIS — E611 Iron deficiency: Secondary | ICD-10-CM | POA: Diagnosis not present

## 2020-11-28 DIAGNOSIS — E559 Vitamin D deficiency, unspecified: Secondary | ICD-10-CM | POA: Diagnosis not present

## 2020-11-28 DIAGNOSIS — M810 Age-related osteoporosis without current pathological fracture: Secondary | ICD-10-CM | POA: Diagnosis not present

## 2020-11-28 MED ORDER — DICLOFENAC SODIUM 2 % EX SOLN: 2.0000 | Freq: Two times a day (BID) | CUTANEOUS | 4 refills | Status: DC

## 2020-11-28 MED ORDER — LEVOTHYROXINE SODIUM 150 MCG PO TABS: ORAL_TABLET | ORAL | 0 refills | Status: DC

## 2020-11-28 MED ORDER — METHOTREXATE SODIUM 15 MG PO TABS: 15.0000 mg | ORAL_TABLET | ORAL | 1 refills | Status: DC

## 2020-11-28 NOTE — Progress Notes (Signed)
Pt reports that she unsure why she is here.  Her husband would like to know if she can get an Rx for the [place] 3%

## 2020-11-28 NOTE — Progress Notes (Signed)
Subjective:    Patient ID: [name], female    DOB: Aug 04, 1950, 70 y.o.   MRN: [id number]  HPI Pt is a 70 yo female with hypertension, CAD, history of MI, COPD, hypothyroidism, RA, OA, osteoporosis, MDD, anxiety who presents to the clinic with husband.  Her husband is concerned about her fatigue and mental status.  He does not feel like she is taking her medications.  He is concerned that she really does nothing throughout the day except sleep.  She denies any hallucinations or psychosis.  She does not feel like she is depressed she just feels tired.  Patient reports to be sleeping about 16 hours a day.  She admits she does not like she is taking her medication as directed.  She is very frustrated with amount of pain she is in with RA and OA.  She also is not taking her medications as prescribed.  Pt had positive cololguard over a year ago but never followed up. Denies any melena or hematochezia.   [name]. Active Ambulatory Problems    Diagnosis Date Noted   Mixed hyperlipidemia 09/28/2007   HYPERCALCEMIA 11/13/2009   MDD (major depressive disorder), recurrent episode, mild (HCC) 09/28/2007   Chronic pain syndrome 06/05/2007   FATTY LIVER DISEASE 09/28/2007   ARTHRITIS, ACROMIOCLAVICULAR 07/31/2008   SPINAL STENOSIS IN CERVICAL REGION 07/31/2008   Osteoporosis 10/17/2007   Elevated liver function tests 09/28/2007   Allergic rhinitis 10/06/2010   Chronic headache 04/03/2011   Tobacco use 04/03/2011   Chronic pain 04/03/2011   Benign hypertension 04/03/2011   Sacroiliitis (HCC) 09/14/2011   Diabetes type 2, controlled (HCC) 03/14/2013   Hypothyroidism 03/14/2013   Trochanteric bursitis of right hip 05/04/2013   Anxiety state 05/16/2013   Ganglion cyst of wrist 07/13/2013   Acute pain of left shoulder 09/23/2013   COLD (chronic obstructive lung disease) (HCC) 12/06/2013   Hyperlipidemia 01/22/2015   COPD (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)  03/05/2015   Myocardial infarct (HCC) 03/09/2017   CAD (coronary artery disease) 03/09/2017   Rotator cuff tendonitis, left 03/10/2017   Anxiety 03/12/2017   Left upper arm swelling secondary to rotator cuff tear and fluid tracking down biceps tendon sheath 04/14/2017   Common bile duct dilatation 04/27/2017   Lumbar degenerative disc disease 05/13/2017   Abnormal finding on GI tract imaging 05/25/2017   Chronic back pain 02/27/2016   Atherosclerosis of native coronary artery of native heart without angina pectoris 03/16/2016   RA (rheumatoid arthritis) (HCC) 07/06/2016   Acquired hypothyroidism 12/01/2015   Moderate episode of recurrent major depressive disorder (HCC) 07/24/2017   Bilateral calf pain 07/24/2017   Mild cognitive impairment 07/24/2017   Bilateral cataracts 09/07/2017   Dry eye syndrome of bilateral lacrimal glands 09/07/2017   Positive colorectal cancer screening using Cologuard test 10/07/2017   Microalbuminuria 10/07/2017   Weakness of both lower extremities 10/07/2017   History of diverticulitis 11/21/2017   Non-intractable vomiting with nausea 11/21/2017   Left lower quadrant guarding 11/21/2017   Generalized abdominal pain 11/21/2017   Vasovagal syncope 12/16/2017   Vitamin D deficiency 04/05/2018   Primary insomnia 04/05/2018   Hiatal hernia 04/05/2018   Primary osteoarthritis 07/14/2018   Gastroesophageal reflux disease with esophagitis 07/14/2018   Fixed pupil of both eyes 07/18/2018   Frequent headaches 07/18/2018   Unsteady gait 07/18/2018   Fatigue 12/11/2018   Excessive sleepiness 05/15/2019   No appetite 05/15/2019   SIRS (systemic inflammatory response syndrome) (HCC) 06/04/2019  Sepsis (HCC) 06/04/2019   Bilateral hand swelling 07/16/2019   Bilateral hand pain 07/16/2019   Arthralgia of lower leg 10/22/2019   Left lumbar radiculopathy 11/05/2019   Chronic upper neck pain 12/18/2019   Polypharmacy 08/04/2020   Declined smoking cessation  08/05/2020   Confusion 11/25/2020   Medication non-compliance due to excessive pill burden 11/28/2020   Resolved Ambulatory Problems    Diagnosis Date Noted   DM (diabetes mellitus) (HCC) 06/05/2007   HEMORRHOIDS, WITH BLEEDING 04/22/2008   Acute maxillary sinusitis 04/10/2010   Alcoholic fatty liver 12/24/2009   RENAL INSUFFICIENCY, ACUTE 10/23/2008   ROTATOR CUFF SYNDROME, RIGHT 04/01/2008   ALLERGIC REACTION 12/24/2009   ELEVATED BLOOD PRESSURE WITHOUT DIAGNOSIS OF HYPERTENSION 05/13/2010   CHRONIC OBSTRUCTIVE PULMONARY DISEASE, ACUTE EXACERBATION 08/14/2010   Recurrent acute sinusitis 09/09/2010   Allergic dermatitis due to poison ivy 11/27/2010   DERMATITIS, ATOPIC 01/15/2011   Arthritis of carpometacarpal joint 02/14/2012   Ganglion cyst of left foot 07/13/2013   Chronic pain syndrome 12/05/2013   Primary osteoarthritis of left wrist 12/10/2014   Rheumatoid arthritis involving both hands (HCC) 03/09/2017   Bulging lumbar disc 02/27/2016   Pyelonephritis 11/21/2017   Upper abdominal pain 03/10/2018   Patellar sleeve fracture, right, closed, initial encounter 04/24/2018   Past Medical History:  Diagnosis Date   Cervicalgia    COPD (chronic obstructive pulmonary disease) (HCC)    DDD (degenerative disc disease), lumbar    Diabetes mellitus    Fatty liver    Narcotic abuse (HCC)    Osteoarthritis    Osteoporosis    Rheumatoid arthritis (HCC)    Thyroid disease       Review of Systems     Objective:   Physical Exam    .. Depression screen [facility] 2/9 01/01/2020 05/11/2019 09/19/2018 01/25/2018 03/09/2017  Decreased Interest 2 1 0 0 0  Down, Depressed, Hopeless 1 1 0 0 1  PHQ - 2 Score 3 2 0 0 1  Altered sleeping 0 1 - - -  Tired, decreased energy 3 2 - - -  Change in appetite 0 2 - - -  Feeling bad or failure about yourself  0 2 - - -  Trouble concentrating 0 2 - - -  Moving slowly or fidgety/restless 0 0 - - -  Suicidal thoughts 0 2 - - -  PHQ-9 Score 6 13 -  - -  Difficult doing work/chores Not difficult at all Somewhat difficult - - -  Some recent data might be hidden   .[name] GAD 7 : Generalized Anxiety Score 01/01/2020 05/11/2019 09/19/2018  Nervous, Anxious, on Edge 2 3 1   Control/stop worrying 2 0 0  Worry too much - different things 0 0 0  Trouble relaxing 2 0 1  Restless 0 0 0  Easily annoyed or irritable 0 0 0  Afraid - awful might happen 0 0 0  Total GAD 7 Score 6 3 2   Anxiety Difficulty Not difficult at all Somewhat difficult Not difficult at all     MMSE - Mini Mental State Exam 11/28/2020 11/12/2019 09/12/2019  Orientation to time 5 5 4   Orientation to Place 5 5 5   Registration 3 3 3   Attention/ Calculation 5 5 4   Recall 2 3 3   Language- name 2 objects 2 2 2   Language- repeat 1 1 1   Language- follow 3 step command 3 3 3   Language- read & follow direction 1 1 1   Write a sentence 1 1 1  Copy design 0 1 1  Total score 28 30 28        Assessment & Plan:  Marland KitchenDiane was seen today for follow-up.  Diagnoses and all orders for this visit:  Chronic fatigue -     CBC w/Diff/Platelet -     Fe+TIBC+Fer -     Thyroid Panel With TSH -     COMPLETE METABOLIC PANEL WITH GFR -     Hemoglobin A1c -     B12 and Folate Panel -     VITAMIN D 25 Hydroxy (Vit-D Deficiency, Fractures)  Acquired hypothyroidism -     levothyroxine (SYNTHROID) 150 MCG tablet; 1 tab (150 mcg) by mouth every Sunday, Monday, Wednesday, Friday. 2 tabs (300 mcg) by mouth every Tuesday, Thursday, Saturday. -     Thyroid Panel With TSH  Rheumatoid arthritis involving multiple sites with positive rheumatoid factor (HCC) -     Discontinue: methotrexate (RHEUMATREX) 15 MG tablet; Take 1 tablet (15 mg total) by mouth once a week. Caution: Chemotherapy. Protect from light. Take 1 tablet (15 mg total) by mouth once a week. Caution: Chemotherapy. Protect from light. -     Diclofenac Sodium (PENNSAID) 2 % SOLN; Apply 2 Pump topically 2 (two) times daily.  Mild cognitive  impairment -     CBC w/Diff/Platelet -     Fe+TIBC+Fer -     Thyroid Panel With TSH -     COMPLETE METABOLIC PANEL WITH GFR -     Hemoglobin A1c -     B12 and Folate Panel -     VITAMIN D 25 Hydroxy (Vit-D Deficiency, Fractures)  Positive colorectal cancer screening using Cologuard test -     Ambulatory referral to Gastroenterology  Excessive sleepiness  Medication non-compliance due to excessive pill burden  Husband is concerned about memory and dementia.  She aced the MMSE.   Perhaps xanax is contributing to fatigue and memory concerns/confusion. Plan is to slowly decrease. First decrease was to .5mg  twice a day from 1mg  twice a day.   I do not think patient is taking medication as directed. Husband states he is going to start managing her medications better. This could help with fatigue.  Labs to check for metabolic reasons for fatigue.   Pt is in chronic pain. Pennsaid ordered to try. Has RA on metotrexate and plaquenil and prednisone. She has been discharge from her rheumatologist and we are having a hard time finding any rheumatologist to take her. She is high risk for narcotics and not able to prescribe. For now I will rx meds.   Pt had positive cologuard over a year ago and never followed up on it. Referral made again to GI. Discussed need for this follow up to make sure she does not have colon cancer. Discussed risk in not following up.   Follow up in 3 months after taking medication as directed.   Spent 40 minutes with patient and patient's husband discussing labs, plans, medications, side effects and goals.

## 2020-11-29 LAB — CBC WITH DIFFERENTIAL/PLATELET
Absolute Monocytes: 904 cells/uL (ref 200–950)
Basophils Absolute: 67 cells/uL (ref 0–200)
Basophils Relative: 0.5 %
Eosinophils Absolute: 319 cells/uL (ref 15–500)
Eosinophils Relative: 2.4 %
HCT: 39.5 % (ref 35.0–45.0)
Hemoglobin: 12.7 g/dL (ref 11.7–15.5)
Lymphs Abs: 3844 cells/uL (ref 850–3900)
MCH: 29.7 pg (ref 27.0–33.0)
MCHC: 32.2 g/dL (ref 32.0–36.0)
MCV: 92.5 fL (ref 80.0–100.0)
MPV: 11 fL (ref 7.5–12.5)
Monocytes Relative: 6.8 %
Neutro Abs: 8166 cells/uL — ABNORMAL HIGH (ref 1500–7800)
Neutrophils Relative %: 61.4 %
Platelets: 241 10*3/uL (ref 140–400)
RBC: 4.27 10*6/uL (ref 3.80–5.10)
RDW: 11.8 % (ref 11.0–15.0)
Total Lymphocyte: 28.9 %
WBC: 13.3 10*3/uL — ABNORMAL HIGH (ref 3.8–10.8)

## 2020-11-29 LAB — COMPLETE METABOLIC PANEL WITH GFR
AG Ratio: 1.3 (calc) (ref 1.0–2.5)
ALT: 59 U/L — ABNORMAL HIGH (ref 6–29)
AST: 81 U/L — ABNORMAL HIGH (ref 10–35)
Albumin: 3.8 g/dL (ref 3.6–5.1)
Alkaline phosphatase (APISO): 80 U/L (ref 37–153)
BUN/Creatinine Ratio: 21 (calc) (ref 6–22)
BUN: 20 mg/dL (ref 7–25)
CO2: 30 mmol/L (ref 20–32)
Calcium: 10.7 mg/dL — ABNORMAL HIGH (ref 8.6–10.4)
Chloride: 101 mmol/L (ref 98–110)
Creat: 0.95 mg/dL — ABNORMAL HIGH (ref 0.60–0.93)
GFR, Est African American: 70 mL/min/{1.73_m2} (ref >=60)
GFR, Est Non African American: 60 mL/min/{1.73_m2} (ref >=60)
Globulin: 2.9 g/dL (calc) (ref 1.9–3.7)
Glucose, Bld: 205 mg/dL — ABNORMAL HIGH (ref 65–99)
Potassium: 4.1 mmol/L (ref 3.5–5.3)
Sodium: 138 mmol/L (ref 135–146)
Total Bilirubin: 0.2 mg/dL (ref 0.2–1.2)
Total Protein: 6.7 g/dL (ref 6.1–8.1)

## 2020-11-29 LAB — THYROID PANEL WITH TSH
Free Thyroxine Index: 2.3 (ref 1.4–3.8)
T3 Uptake: 31 % (ref 22–35)
T4, Total: 7.4 ug/dL (ref 5.1–11.9)
TSH: 0.51 mIU/L (ref 0.40–4.50)

## 2020-11-29 LAB — IRON,TIBC AND FERRITIN PANEL
%SAT: 20 % (calc) (ref 16–45)
Ferritin: 181 ng/mL (ref 16–288)
Iron: 64 ug/dL (ref 45–160)
TIBC: 314 mcg/dL (calc) (ref 250–450)

## 2020-11-29 LAB — HEMOGLOBIN A1C
Hgb A1c MFr Bld: 7.7 % of total Hgb — ABNORMAL HIGH (ref <5.7)
Mean Plasma Glucose: 174 mg/dL
eAG (mmol/L): 9.7 mmol/L

## 2020-11-29 LAB — VITAMIN D 25 HYDROXY (VIT D DEFICIENCY, FRACTURES): Vit D, 25-Hydroxy: 60 ng/mL (ref 30–100)

## 2020-11-29 LAB — B12 AND FOLATE PANEL
Folate: 9.5 ng/mL
Vitamin B-12: 482 pg/mL (ref 200–1100)

## 2020-12-02 ENCOUNTER — Other Ambulatory Visit: Payer: Self-pay | Admitting: Neurology

## 2020-12-02 ENCOUNTER — Encounter: Payer: Self-pay | Admitting: Physician Assistant

## 2020-12-02 DIAGNOSIS — R748 Abnormal levels of other serum enzymes: Secondary | ICD-10-CM

## 2020-12-02 NOTE — Progress Notes (Signed)
[name],   B12 and folate and Vitamin D look great.  Sugars are not controlled. Make sure taking metformin daily. In 3 months if not down we must consider adding more medication.  Thyroid in normal range and not causing fatigue.  Iron looks better.  Calcium is up and liver enzymes are up.  Are you drinking any alcohol? Stop alcohol and tylenol and recheck PTH and hepatic function panel in 2 weeks.

## 2020-12-03 ENCOUNTER — Other Ambulatory Visit: Payer: Self-pay | Admitting: Neurology

## 2020-12-03 MED ORDER — METHOTREXATE 2.5 MG PO TABS: 15.0000 mg | ORAL_TABLET | ORAL | 2 refills | Status: DC

## 2020-12-05 ENCOUNTER — Other Ambulatory Visit: Payer: Self-pay | Admitting: Physician Assistant

## 2020-12-05 DIAGNOSIS — F5101 Primary insomnia: Secondary | ICD-10-CM

## 2020-12-09 ENCOUNTER — Encounter: Payer: Self-pay | Admitting: Physician Assistant

## 2020-12-10 ENCOUNTER — Telehealth: Payer: Self-pay

## 2020-12-10 ENCOUNTER — Telehealth: Payer: Medicare HMO

## 2020-12-10 NOTE — Telephone Encounter (Signed)
Received fax stating a phone call was necessary to proceed with PA. Call made; patient has a new ID #. Completed PA over the phone. Case has to be sent to pharmacist review. Case # [id number]

## 2020-12-10 NOTE — Telephone Encounter (Signed)
  Care Management   Follow Up Note   12/10/2020 Name: TENNILLE MONTELONGO MRN: [id number] DOB: 01/02/1951   Referred by: [name], PA-C Reason for referral : Chronic Care Management (RNCM follow up)   An unsuccessful telephone outreach was attempted today. The patient was referred to the case management team for assistance with care management and care coordination.   Follow Up Plan: The care management team will reach out to the patient again over the next 30 days.   Kathyrn Sheriff, RN, MSN, BSN, CCM Care Management Coordinator [facility] [phone]

## 2020-12-10 NOTE — Telephone Encounter (Signed)
Fax received from pharmacy indicating a PA was needed for Pennsaid 2%. Using Covermymeds, a PA was completed and submitted. Immediate response received.   [facility] [employer] is not able to process this request through ePA, please contact the plan at [phone] or fax in request to [phone].  PA was printed and faxed to number indicated in message. Fax confirmation was collected.

## 2020-12-12 NOTE — Telephone Encounter (Signed)
Received fax that medication was denied by insurance. Given to provider to make clinical decision on next steps.

## 2020-12-15 NOTE — Telephone Encounter (Signed)
Left msg for a return call regarding a PA.  

## 2020-12-18 ENCOUNTER — Other Ambulatory Visit: Payer: Self-pay | Admitting: Physician Assistant

## 2020-12-18 ENCOUNTER — Ambulatory Visit: Payer: Medicare HMO | Admitting: Family Medicine

## 2020-12-18 DIAGNOSIS — F5101 Primary insomnia: Secondary | ICD-10-CM

## 2020-12-19 ENCOUNTER — Telehealth: Payer: Self-pay | Admitting: *Deleted

## 2020-12-19 NOTE — Chronic Care Management (AMB) (Signed)
  Care Management   Note  12/19/2020 Name: [name] MRN: [id number] DOB: 17-Jun-1950  [name] is a 70 y.o. year old female who is a primary care patient of Nolene Ebbs and is actively engaged with the care management team. I reached out to [name] by phone today to assist with re-scheduling a follow up visit with the RN Case Manager  Follow up plan: Unsuccessful telephone outreach attempt made. A HIPAA compliant phone message was left for the patient providing contact information and requesting a return call.   Burman Nieves, CCMA Care Guide, Embedded Care Coordination [facility]  Care Management  Direct Dial: [phone]

## 2020-12-19 NOTE — Telephone Encounter (Signed)
Last written 11/25/20 #60 no refills Last visit 11/28/2020

## 2020-12-22 ENCOUNTER — Other Ambulatory Visit: Payer: Self-pay | Admitting: Physician Assistant

## 2020-12-22 DIAGNOSIS — M0579 Rheumatoid arthritis with rheumatoid factor of multiple sites without organ or systems involvement: Secondary | ICD-10-CM

## 2020-12-23 ENCOUNTER — Telehealth: Payer: Self-pay | Admitting: Neurology

## 2020-12-23 ENCOUNTER — Other Ambulatory Visit: Payer: Self-pay | Admitting: Physician Assistant

## 2020-12-23 DIAGNOSIS — E039 Hypothyroidism, unspecified: Secondary | ICD-10-CM

## 2020-12-23 DIAGNOSIS — E559 Vitamin D deficiency, unspecified: Secondary | ICD-10-CM

## 2020-12-23 MED ORDER — DICLOFENAC SODIUM 1 % EX CREA: 1.0000 "application " | TOPICAL_CREAM | Freq: Two times a day (BID) | CUTANEOUS | 1 refills | Status: AC

## 2020-12-23 NOTE — Telephone Encounter (Signed)
Patient left vm about wanting to try Rinvoq for a couple weeks. Called patient back, went straight to vm. Let her know that she would need an office visit to discuss starting any new medications.

## 2020-12-23 NOTE — Telephone Encounter (Signed)
Diclofenac sent per Aamari West.

## 2020-12-23 NOTE — Telephone Encounter (Signed)
Alternative  

## 2020-12-23 NOTE — Telephone Encounter (Signed)
LVM for pt to call to discuss.  T. Cordarious Zeek, CMA  

## 2020-12-23 NOTE — Chronic Care Management (AMB) (Signed)
  Care Management   Note  12/23/2020 Name: [name] MRN: [id number] DOB: 1950-11-03  [name] is a 70 y.o. year old female who is a primary care patient of Nolene Ebbs and is actively engaged with the care management team. I reached out to [name] by phone today to assist with re-scheduling a follow up visit with the RN Case Manager  Follow up plan: Telephone appointment with care management team member scheduled for: 01/14/2021  Burman Nieves, CCMA Care Guide, Embedded Care Coordination [facility]  Care Management  Direct Dial: [phone]

## 2020-12-24 NOTE — Telephone Encounter (Signed)
MyChart message sent to pt with information since she has not returned multiple phone calls.  Tiajuana Amass, CMA

## 2020-12-29 DIAGNOSIS — J449 Chronic obstructive pulmonary disease, unspecified: Secondary | ICD-10-CM | POA: Diagnosis not present

## 2020-12-29 DIAGNOSIS — R0602 Shortness of breath: Secondary | ICD-10-CM | POA: Diagnosis not present

## 2020-12-29 DIAGNOSIS — R000 Tachycardia, unspecified: Secondary | ICD-10-CM | POA: Diagnosis not present

## 2020-12-29 DIAGNOSIS — R079 Chest pain, unspecified: Secondary | ICD-10-CM | POA: Diagnosis not present

## 2020-12-31 ENCOUNTER — Other Ambulatory Visit: Payer: Self-pay

## 2020-12-31 ENCOUNTER — Encounter: Payer: Self-pay | Admitting: Physician Assistant

## 2020-12-31 ENCOUNTER — Ambulatory Visit (INDEPENDENT_AMBULATORY_CARE_PROVIDER_SITE_OTHER): Payer: Medicare HMO | Admitting: Physician Assistant

## 2020-12-31 VITALS — BP 138/91 | HR 109 | Temp 99.0°F | Ht 63.0 in | Wt 127.0 lb

## 2020-12-31 DIAGNOSIS — Z1211 Encounter for screening for malignant neoplasm of colon: Secondary | ICD-10-CM

## 2020-12-31 DIAGNOSIS — L304 Erythema intertrigo: Secondary | ICD-10-CM | POA: Diagnosis not present

## 2020-12-31 DIAGNOSIS — E119 Type 2 diabetes mellitus without complications: Secondary | ICD-10-CM

## 2021-01-05 ENCOUNTER — Encounter: Payer: Self-pay | Admitting: Physician Assistant

## 2021-01-05 ENCOUNTER — Other Ambulatory Visit: Payer: Self-pay | Admitting: Physician Assistant

## 2021-01-05 DIAGNOSIS — M5136 Other intervertebral disc degeneration, lumbar region: Secondary | ICD-10-CM

## 2021-01-05 DIAGNOSIS — L304 Erythema intertrigo: Secondary | ICD-10-CM | POA: Insufficient documentation

## 2021-01-05 DIAGNOSIS — F5101 Primary insomnia: Secondary | ICD-10-CM

## 2021-01-05 MED ORDER — NYSTATIN 100000 UNIT/GM EX OINT: 1.0000 "application " | TOPICAL_OINTMENT | Freq: Two times a day (BID) | CUTANEOUS | 1 refills | Status: DC

## 2021-01-05 NOTE — Progress Notes (Signed)
Subjective:    Patient ID: [name], female    DOB: 10/29/1950, 69 y.o.   MRN: [id number]  HPI Pt is a 70 yo female with T2DM, COPD, HTN, CAD who presents to the clinic with rash under breast that comes and goes.   Rash is red and itchy. She admits to sweating. Not tried anything to make better  .[name] Active Ambulatory Problems    Diagnosis Date Noted   Mixed hyperlipidemia 09/28/2007   HYPERCALCEMIA 11/13/2009   MDD (major depressive disorder), recurrent episode, mild (HCC) 09/28/2007   Chronic pain syndrome 06/05/2007   FATTY LIVER DISEASE 09/28/2007   ARTHRITIS, ACROMIOCLAVICULAR 07/31/2008   SPINAL STENOSIS IN CERVICAL REGION 07/31/2008   Osteoporosis 10/17/2007   Elevated liver function tests 09/28/2007   Allergic rhinitis 10/06/2010   Chronic headache 04/03/2011   Tobacco use 04/03/2011   Chronic pain 04/03/2011   Benign hypertension 04/03/2011   Sacroiliitis (HCC) 09/14/2011   Diabetes type 2, controlled (HCC) 03/14/2013   Hypothyroidism 03/14/2013   Trochanteric bursitis of right hip 05/04/2013   Anxiety state 05/16/2013   Ganglion cyst of wrist 07/13/2013   Acute pain of left shoulder 09/23/2013   COLD (chronic obstructive lung disease) (HCC) 12/06/2013   Hyperlipidemia 01/22/2015   COPD (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) 03/05/2015   Myocardial infarct (HCC) 03/09/2017   CAD (coronary artery disease) 03/09/2017   Rotator cuff tendonitis, left 03/10/2017   Anxiety 03/12/2017   Left upper arm swelling secondary to rotator cuff tear and fluid tracking down biceps tendon sheath 04/14/2017   Common bile duct dilatation 04/27/2017   Lumbar degenerative disc disease 05/13/2017   Abnormal finding on GI tract imaging 05/25/2017   Chronic back pain 02/27/2016   Atherosclerosis of native coronary artery of native heart without angina pectoris 03/16/2016   RA (rheumatoid arthritis) (HCC) 07/06/2016   Acquired hypothyroidism  12/01/2015   Moderate episode of recurrent major depressive disorder (HCC) 07/24/2017   Bilateral calf pain 07/24/2017   Mild cognitive impairment 07/24/2017   Bilateral cataracts 09/07/2017   Dry eye syndrome of bilateral lacrimal glands 09/07/2017   Positive colorectal cancer screening using Cologuard test 10/07/2017   Microalbuminuria 10/07/2017   Weakness of both lower extremities 10/07/2017   History of diverticulitis 11/21/2017   Non-intractable vomiting with nausea 11/21/2017   Left lower quadrant guarding 11/21/2017   Generalized abdominal pain 11/21/2017   Vasovagal syncope 12/16/2017   Vitamin D deficiency 04/05/2018   Primary insomnia 04/05/2018   Hiatal hernia 04/05/2018   Primary osteoarthritis 07/14/2018   Gastroesophageal reflux disease with esophagitis 07/14/2018   Fixed pupil of both eyes 07/18/2018   Frequent headaches 07/18/2018   Unsteady gait 07/18/2018   Fatigue 12/11/2018   Excessive sleepiness 05/15/2019   No appetite 05/15/2019   SIRS (systemic inflammatory response syndrome) (HCC) 06/04/2019   Sepsis (HCC) 06/04/2019   Bilateral hand swelling 07/16/2019   Bilateral hand pain 07/16/2019   Arthralgia of lower leg 10/22/2019   Left lumbar radiculopathy 11/05/2019   Chronic upper neck pain 12/18/2019   Polypharmacy 08/04/2020   Declined smoking cessation 08/05/2020   Confusion 11/25/2020   Medication non-compliance due to excessive pill burden 11/28/2020   Intertrigo 01/05/2021   Resolved Ambulatory Problems    Diagnosis Date Noted   DM (diabetes mellitus) (HCC) 06/05/2007   HEMORRHOIDS, WITH BLEEDING 04/22/2008   Acute maxillary sinusitis 04/10/2010   Alcoholic fatty liver 12/24/2009   RENAL INSUFFICIENCY, ACUTE 10/23/2008   ROTATOR CUFF SYNDROME, RIGHT  04/01/2008   ALLERGIC REACTION 12/24/2009   ELEVATED BLOOD PRESSURE WITHOUT DIAGNOSIS OF HYPERTENSION 05/13/2010   CHRONIC OBSTRUCTIVE PULMONARY DISEASE, ACUTE EXACERBATION 08/14/2010   Recurrent  acute sinusitis 09/09/2010   Allergic dermatitis due to poison ivy 11/27/2010   DERMATITIS, ATOPIC 01/15/2011   Arthritis of carpometacarpal joint 02/14/2012   Ganglion cyst of left foot 07/13/2013   Chronic pain syndrome 12/05/2013   Primary osteoarthritis of left wrist 12/10/2014   Rheumatoid arthritis involving both hands (HCC) 03/09/2017   Bulging lumbar disc 02/27/2016   Pyelonephritis 11/21/2017   Upper abdominal pain 03/10/2018   Patellar sleeve fracture, right, closed, initial encounter 04/24/2018   Past Medical History:  Diagnosis Date   Cervicalgia    COPD (chronic obstructive pulmonary disease) (HCC)    DDD (degenerative disc disease), lumbar    Diabetes mellitus    Fatty liver    Narcotic abuse (HCC)    Osteoarthritis    Osteoporosis    Rheumatoid arthritis (HCC)    Thyroid disease       Review of Systems See HPI.     Objective:   Physical Exam  Pt declined skin examination under breast.       Assessment & Plan:  Marland KitchenMarland KitchenDiane was seen today for fatigue and rash.  Diagnoses and all orders for this visit:  Intertrigo -     nystatin ointment (MYCOSTATIN); Apply 1 application topically 2 (two) times daily.  Controlled type 2 diabetes mellitus without complication, without long-term current use of insulin (HCC) -     Ambulatory referral to Ophthalmology  Screen for colon cancer -     Ambulatory referral to Gastroenterology  Too soon for A1C.  Discussed medication compliance and DM diet and nutrition.  Needs DM eye exam. Ordered today.   Pt finally agreed to colonoscopy after positive cologuard in 2019.  Referral placed today.   Intertrigo suspected with rash under breast. Nystatin cream sent. Keep area dry.

## 2021-01-07 ENCOUNTER — Ambulatory Visit (INDEPENDENT_AMBULATORY_CARE_PROVIDER_SITE_OTHER): Payer: Medicare HMO | Admitting: Physician Assistant

## 2021-01-07 ENCOUNTER — Encounter: Payer: Self-pay | Admitting: Physician Assistant

## 2021-01-07 VITALS — BP 172/72 | HR 96 | Ht 63.0 in | Wt 124.0 lb

## 2021-01-07 DIAGNOSIS — M0579 Rheumatoid arthritis with rheumatoid factor of multiple sites without organ or systems involvement: Secondary | ICD-10-CM | POA: Diagnosis not present

## 2021-01-07 DIAGNOSIS — F331 Major depressive disorder, recurrent, moderate: Secondary | ICD-10-CM

## 2021-01-07 DIAGNOSIS — E782 Mixed hyperlipidemia: Secondary | ICD-10-CM | POA: Diagnosis not present

## 2021-01-07 DIAGNOSIS — F330 Major depressive disorder, recurrent, mild: Secondary | ICD-10-CM | POA: Diagnosis not present

## 2021-01-07 DIAGNOSIS — Z9114 Patient's other noncompliance with medication regimen: Secondary | ICD-10-CM

## 2021-01-07 DIAGNOSIS — I10 Essential (primary) hypertension: Secondary | ICD-10-CM | POA: Diagnosis not present

## 2021-01-07 DIAGNOSIS — R69 Illness, unspecified: Secondary | ICD-10-CM | POA: Diagnosis not present

## 2021-01-07 DIAGNOSIS — I2510 Atherosclerotic heart disease of native coronary artery without angina pectoris: Secondary | ICD-10-CM

## 2021-01-07 DIAGNOSIS — F419 Anxiety disorder, unspecified: Secondary | ICD-10-CM

## 2021-01-07 MED ORDER — DESVENLAFAXINE SUCCINATE ER 100 MG PO TB24: 100.0000 mg | ORAL_TABLET | Freq: Every day | ORAL | 1 refills | Status: DC

## 2021-01-07 MED ORDER — BUSPIRONE HCL 7.5 MG PO TABS: 7.5000 mg | ORAL_TABLET | Freq: Three times a day (TID) | ORAL | 2 refills | Status: DC

## 2021-01-07 MED ORDER — HYDROXYCHLOROQUINE SULFATE 200 MG PO TABS: ORAL_TABLET | ORAL | 1 refills | Status: DC

## 2021-01-07 MED ORDER — BUPROPION HCL ER (XL) 150 MG PO TB24: ORAL_TABLET | ORAL | 1 refills | Status: DC
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
## 2021-01-13 DIAGNOSIS — I10 Essential (primary) hypertension: Secondary | ICD-10-CM | POA: Insufficient documentation

## 2021-01-14 ENCOUNTER — Telehealth: Payer: Medicare HMO

## 2021-01-21 ENCOUNTER — Other Ambulatory Visit: Payer: Self-pay | Admitting: Physician Assistant

## 2021-01-21 ENCOUNTER — Telehealth: Payer: Medicare HMO

## 2021-01-21 DIAGNOSIS — F5101 Primary insomnia: Secondary | ICD-10-CM

## 2021-01-23 ENCOUNTER — Telehealth: Payer: Self-pay | Admitting: *Deleted

## 2021-01-23 NOTE — Chronic Care Management (AMB) (Signed)
  Care Management   Note  01/23/2021 Name: [name] MRN: [id number] DOB: [birth year]  [name] is a 70 y.o. year old female who is a primary care patient of Nolene Ebbs and is actively engaged with the care management team. I reached out to [name] by phone today to assist with re-scheduling a follow up visit with the RN Case Manager  Follow up plan: Unsuccessful telephone outreach attempt made. A HIPAA compliant phone message was left for the patient providing contact information and requesting a return call.   Burman Nieves, CCMA Care Guide, Embedded Care Coordination [facility]  Care Management  Direct Dial: [phone]

## 2021-01-26 ENCOUNTER — Telehealth: Payer: Self-pay | Admitting: Neurology

## 2021-01-26 NOTE — Telephone Encounter (Signed)
Buproprion should be very affordable. She should be on pristiq? Is she taking that?

## 2021-01-26 NOTE — Telephone Encounter (Signed)
I can not reach patient to see if she is taking the other medication, her phone goes straight to voicemail.

## 2021-01-26 NOTE — Telephone Encounter (Signed)
Patient left vm stating Bupropion was over $140 and wants something else. She is asking for a limited supply of Xanax in place of this.   Looking up Good RX coupons the cost of this medication at [facility] is $50 with coupon and patient could get this at [facility] for $38 retail. This is for 90 day supplies.  I tried to call the patient to make her aware, phone went straight to voicemail. LMOM letting patient know the above information and instructed her to call back and let us know if she would like [place] to send to [facility] pharmacy or send her this coupon.   Raeford Brandenburg - FYI.

## 2021-01-29 NOTE — Chronic Care Management (AMB) (Signed)
  Care Management   Note  01/29/2021 Name: [name] MRN: [id number] DOB: 01-Aug-1950  [name] is a 70 y.o. year old female who is a primary care patient of Nolene Ebbs and is actively engaged with the care management team. I reached out to [name] by phone today to assist with re-scheduling a follow up visit with the RN Case Manager  Follow up plan: 2nd attempt Unsuccessful telephone outreach attempt made. A HIPAA compliant phone message was left for the patient providing contact information and requesting a return call.   Burman Nieves, CCMA Care Guide, Embedded Care Coordination [facility]  Care Management  Direct Dial: [phone]

## 2021-02-04 NOTE — Chronic Care Management (AMB) (Signed)
  Care Management   Note  02/04/2021 Name: GIANNE SHUGARS MRN: [id number] DOB: [birth year]  [name] is a 70 y.o. year old female who is a primary care patient of Nolene Ebbs and is actively engaged with the care management team. I reached out to [name] by phone today to assist with re-scheduling a follow up visit with the RN Case Manager  Follow up plan: Telephone appointment with care management team member scheduled for: 02/18/2021  Burman Nieves, CCMA Care Guide, Embedded Care Coordination [facility]  Care Management  Direct Dial: [phone]

## 2021-02-09 ENCOUNTER — Telehealth: Payer: Self-pay | Admitting: Physician Assistant

## 2021-02-09 NOTE — Telephone Encounter (Signed)
  Left message for patient to call back and schedule Medicare Annual Wellness Visit (AWV) either virtually or in office.   Last AWV 01/25/18  please schedule at anytime with health coach

## 2021-02-16 ENCOUNTER — Telehealth: Payer: Self-pay

## 2021-02-16 NOTE — Telephone Encounter (Signed)
Medication: desvenlafaxine (PRISTIQ) 100 MG 24 hr tablet Prior authorization submitted via CoverMyMeds on 02/16/2021 PA submission pending

## 2021-02-18 ENCOUNTER — Telehealth: Payer: Medicare HMO

## 2021-02-19 ENCOUNTER — Telehealth (INDEPENDENT_AMBULATORY_CARE_PROVIDER_SITE_OTHER): Payer: Medicare HMO | Admitting: Family Medicine

## 2021-02-19 ENCOUNTER — Encounter: Payer: Self-pay | Admitting: Family Medicine

## 2021-02-19 VITALS — BP 149/81 | HR 93 | Resp 20 | Ht 63.0 in | Wt 124.0 lb

## 2021-02-19 DIAGNOSIS — R197 Diarrhea, unspecified: Secondary | ICD-10-CM

## 2021-02-19 DIAGNOSIS — R410 Disorientation, unspecified: Secondary | ICD-10-CM | POA: Diagnosis not present

## 2021-02-19 DIAGNOSIS — R102 Pelvic and perineal pain: Secondary | ICD-10-CM

## 2021-02-19 DIAGNOSIS — R5383 Other fatigue: Secondary | ICD-10-CM | POA: Diagnosis not present

## 2021-02-19 DIAGNOSIS — E039 Hypothyroidism, unspecified: Secondary | ICD-10-CM | POA: Diagnosis not present

## 2021-02-19 LAB — POCT INFLUENZA A/B
Influenza A, POC: NEGATIVE
Influenza B, POC: NEGATIVE

## 2021-02-19 NOTE — Patient Instructions (Addendum)
Flu and COVID swabs today  Urine sample today

## 2021-02-19 NOTE — Progress Notes (Signed)
Direct link sent via text.

## 2021-02-19 NOTE — Progress Notes (Signed)
Acute Office Visit  Subjective:    Patient ID: [name], female    DOB: Jun 17, 1950, 70 y.o.   MRN: [id number]  Chief Complaint  Patient presents with   Fatigue    HPI Patient is in today for fatigue, weakness, recent diarrhea and fever.  Patient is here complaining of about 2 weeks of fatigue, weakness, brain fog (trouble recalling things, occasional mild headaches), occasional chills, poor appetite.  States earlier this week she had diarrhea and fever for 2 days which has now resolved.  She never took a COVID test.  Today she is also complaining of 7 out of 10 lower abdominal/suprapubic discomfort described as achiness/pressure.  She she denies any urgency, frequency, back pain, flank pain, dysuria, hematuria, blood in stool, vomiting, chest pain, shortness of breath.  Per last note with PCP in August, they discussed weaning off of her Xanax to see if this helps her brain fog/memory concerns.  At that visit they requested a urine drug screen but patient stated she could not go and never came back to leave a specimen.  Patient is stating that she has not taken any Xanax in the past 4 weeks, but she does drink 1 beer per day (however, patient initially reported to CMA that she wanted a higher dose of xanax to be given today).  She is a smoker.  She denies any other narcotics or illegal substances.    Past Medical History:  Diagnosis Date   CAD (coronary artery disease)    Cervicalgia    Chronic pain    COPD (chronic obstructive pulmonary disease) (HCC)    DDD (degenerative disc disease), lumbar    Diabetes mellitus    Fatty liver    Hyperlipidemia    Narcotic abuse (HCC)    history   Osteoarthritis    Osteoporosis    Rheumatoid arthritis (HCC)    Thyroid disease    hypothyroid    Past Surgical History:  Procedure Laterality Date   BREAST BIOPSY     BREAST EXCISIONAL BIOPSY     BREAST SURGERY     braest lump/ benign   CHOLECYSTECTOMY     LUMBAR LAMINECTOMY  05/2010    Dr. Netta Corrigan   TOTAL KNEE ARTHROPLASTY  2010   right    Family History  Problem Relation Age of Onset   Heart disease Mother 73       MI   Hyperlipidemia Mother    Rheum arthritis Mother    Lymphoma Mother    Heart disease Father 29       MI   Diabetes Father    Rheum arthritis Father    Cancer Maternal Grandmother        hodgkins lympoma   Cirrhosis Sister        primary biliary    Social History   Socioeconomic History   Marital status: Married    Spouse name: Not on file   Number of children: 2   Years of education: Not on file   Highest education level: Not on file  Occupational History   Not on file  Tobacco Use   Smoking status: Every Day    Packs/day: 1.00    Years: 43.00    Pack years: 43.00    Types: Cigarettes   Smokeless tobacco: Never   Tobacco comments:    Has tried Chantix and Wellbutrin in the past  Substance and Sexual Activity   Alcohol use: Yes    Alcohol/week: 5.0 standard drinks  Types: 5 Standard drinks or equivalent per week    Comment: 1 beer per day   Drug use: No   Sexual activity: Not on file  Other Topics Concern   Not on file  Social History Narrative   Not on file   Social Determinants of Health   Financial Resource Strain: Not on file  Food Insecurity: No Food Insecurity   Worried About Running Out of Food in the Last Year: Never true   Ran Out of Food in the Last Year: Never true  Transportation Needs: No Transportation Needs   Lack of Transportation (Medical): No   Lack of Transportation (Non-Medical): No  Physical Activity: Not on file  Stress: Not on file  Social Connections: Not on file  Intimate Partner Violence: Not on file    Outpatient Medications Prior to Visit  Medication Sig Dispense Refill   ALPRAZolam (XANAX) 0.5 MG tablet TAKE 1 TABLET BY MOUTH 2 TIMES DAILY AS NEEDED FOR ANXIETY. 60 tablet 0   levothyroxine (SYNTHROID) 150 MCG tablet 1 TAB (150 MCG) BY MOUTH EVERY SUNDAY, MONDAY, WEDNESDAY,  FRIDAY. 2 TABS (300 MCG) BY MOUTH EVERY TUESDAY, THURSDAY, SATURDAY. 120 tablet 1   alendronate (FOSAMAX) 70 MG tablet TAKE ONE TABLET EVERY 7 DAYS WITH A FULL GLASS OF WATER ON AN EMPTY STOMACH 12 tablet 3   aspirin EC 81 MG tablet Take 81 mg by mouth daily.     atorvastatin (LIPITOR) 80 MG tablet Take 1 tablet (80 mg total) by mouth daily. (Patient not taking: No sig reported) 90 tablet 3   buPROPion (WELLBUTRIN XL) 150 MG 24 hr tablet TAKE 1 TABLET BY MOUTH EVERY DAY IN THE MORNING 90 tablet 1   busPIRone (BUSPAR) 7.5 MG tablet Take 1 tablet (7.5 mg total) by mouth 3 (three) times daily. 90 tablet 2   cetirizine (ZYRTEC) 10 MG tablet Take 10 mg by mouth daily.     desvenlafaxine (PRISTIQ) 100 MG 24 hr tablet Take 1 tablet (100 mg total) by mouth daily. 90 tablet 1   Diclofenac Sodium (PENNSAID) 2 % SOLN Apply 2 Pump topically 2 (two) times daily. 112 g 4   Diclofenac Sodium 1 % CREA Apply 1 application topically in the morning and at bedtime. 120 g 1   famotidine (PEPCID) 20 MG tablet Take 1 tablet (20 mg total) by mouth 2 (two) times daily. 60 tablet 5   gabapentin (NEURONTIN) 600 MG tablet TAKE 1 TABLET BY MOUTH THREE TIMES A DAY 90 tablet 2   hydroxychloroquine (PLAQUENIL) 200 MG tablet Take one tablet by mouth daily. 90 tablet 1   meloxicam (MOBIC) 15 MG tablet One tab PO qAM with a meal for 2 weeks, then daily prn pain.  (wait until after the prednisone to start this) 30 tablet 1   metFORMIN (GLUCOPHAGE) 1000 MG tablet Take 1 tablet (1,000 mg total) by mouth 2 (two) times daily with a meal. 180 tablet 3   methotrexate (RHEUMATREX) 2.5 MG tablet Take 6 tablets (15 mg total) by mouth once a week. Caution:Chemotherapy. Protect from light. (Patient not taking: No sig reported) 24 tablet 2   predniSONE (DELTASONE) 5 MG tablet TAKE 1 TABLET BY MOUTH EVERY DAY WITH BREAKFAST (Patient not taking: No sig reported) 90 tablet 1   Vitamin D, Ergocalciferol, (DRISDOL) 1.25 MG (50000 UNIT) CAPS capsule  TAKE 1 CAPSULE (50,000 UNITS TOTAL) BY MOUTH EVERY 7 (SEVEN) DAYS 12 capsule 1   No facility-administered medications prior to visit.    Allergies  Allergen Reactions   Codeine Palpitations and Other (See Comments)   Naproxen Rash and Palpitations    GI upset   Budesonide-Formoterol Fumarate Rash    REACTION: rash REACTION: rash REACTION: rash   Duloxetine Hcl Other (See Comments)    No difference for pain or anti-depressant.  Other reaction(s): Other No difference for pain or anti-depressant. No difference for pain or anti-depressant.   Fentanyl Other (See Comments)    REACTION: severe aggitation, mood changes Other reaction(s): Hallucination, Psychiatric Hallucinations Hallucinations REACTION: severe aggitation, mood changes    Tramadol-Acetaminophen     Other reaction(s): Unknown   Effexor Xr [Venlafaxine Hcl Er]     Felt more anxious or no difference.    Fluticasone-Salmeterol    Lisinopril Rash    Maybe we dont really know    Review of Systems All review of systems negative except what is listed in the HPI     Objective:    Physical Exam Vitals reviewed.  Constitutional:      Appearance: Normal appearance.  HENT:     Head: Normocephalic and atraumatic.  Cardiovascular:     Rate and Rhythm: Regular rhythm.     Heart sounds: Normal heart sounds.  Pulmonary:     Effort: Pulmonary effort is normal.     Breath sounds: Normal breath sounds.  Abdominal:     Palpations: Abdomen is soft.     Tenderness: There is no abdominal tenderness.  Musculoskeletal:     Cervical back: Normal range of motion and neck supple. No tenderness.  Skin:    General: Skin is warm and dry.  Neurological:     General: No focal deficit present.     Mental Status: She is alert. Mental status is at baseline.     Cranial Nerves: No cranial nerve deficit.     Coordination: Coordination normal.     Gait: Gait normal.  Psychiatric:     Comments: Restless    BP (!) 149/81 (BP  Location: Left Arm, Patient Position: Sitting, Cuff Size: Normal)   Pulse 93   Resp 20   Ht 5\' 3"  (1.6 m)   Wt 124 lb (56.2 kg)   SpO2 96%   BMI 21.97 kg/m  Wt Readings from Last 3 Encounters:  02/19/21 124 lb (56.2 kg)  01/07/21 124 lb (56.2 kg)  12/31/20 127 lb (57.6 kg)    Health Maintenance Due  Topic Date Due   OPHTHALMOLOGY EXAM  09/03/2018   URINE MICROALBUMIN  10/05/2018   FOOT EXAM  02/12/2020    There are no preventive care reminders to display for this patient.   Lab Results  Component Value Date   TSH 0.51 11/28/2020   Lab Results  Component Value Date   WBC 13.3 (H) 11/28/2020   HGB 12.7 11/28/2020   HCT 39.5 11/28/2020   MCV 92.5 11/28/2020   PLT 241 11/28/2020   Lab Results  Component Value Date   NA 138 11/28/2020   K 4.1 11/28/2020   CO2 30 11/28/2020   GLUCOSE 205 (H) 11/28/2020   BUN 20 11/28/2020   CREATININE 0.95 (H) 11/28/2020   BILITOT 0.2 11/28/2020   ALKPHOS 193 (H) 12/10/2014   AST 81 (H) 11/28/2020   ALT 59 (H) 11/28/2020   PROT 6.7 11/28/2020   ALBUMIN 4.1 12/10/2014   CALCIUM 10.7 (H) 11/28/2020   Lab Results  Component Value Date   CHOL 186 12/13/2018   Lab Results  Component Value Date   HDL 83 12/13/2018  Lab Results  Component Value Date   LDLCALC 79 12/13/2018   Lab Results  Component Value Date   TRIG 138 12/13/2018   Lab Results  Component Value Date   CHOLHDL 2.2 12/13/2018   Lab Results  Component Value Date   HGBA1C 7.7 (H) 11/28/2020       Assessment & Plan:   1. Confusion 2. Fatigue, unspecified type 3. Diarrhea, unspecified type 4. Suprapubic pain  Vague description of symptoms today.  Wanted to get a urine sample today however patient stated that she could not void and that she would come back tomorrow.  Would also like to get a urine drug screen being that she is trying to wean off of Xanax and she declined the drug screen at last visit.  Offered patient some blood work to work-up her  fatigue, but patient declined.  Likely out of window, but adding COVID and Flu test today.  - Novel Coronavirus, NAA ([facility]) - DRUG MONITORING, PANEL 6 WITH CONFIRMATION, URINE - Urinalysis - POCT Influenza A/B  Patient aware of signs/symptoms requiring further/urgent evaluation.  Lollie Marrow Reola Calkins, DNP, FNP-C

## 2021-02-20 ENCOUNTER — Telehealth: Payer: Self-pay

## 2021-02-20 DIAGNOSIS — E039 Hypothyroidism, unspecified: Secondary | ICD-10-CM | POA: Diagnosis not present

## 2021-02-20 DIAGNOSIS — E119 Type 2 diabetes mellitus without complications: Secondary | ICD-10-CM | POA: Diagnosis not present

## 2021-02-20 DIAGNOSIS — G894 Chronic pain syndrome: Secondary | ICD-10-CM | POA: Diagnosis not present

## 2021-02-20 DIAGNOSIS — M069 Rheumatoid arthritis, unspecified: Secondary | ICD-10-CM | POA: Diagnosis not present

## 2021-02-20 DIAGNOSIS — R748 Abnormal levels of other serum enzymes: Secondary | ICD-10-CM | POA: Diagnosis not present

## 2021-02-20 DIAGNOSIS — R102 Pelvic and perineal pain: Secondary | ICD-10-CM | POA: Diagnosis not present

## 2021-02-20 LAB — SARS-COV-2, NAA 2 DAY TAT

## 2021-02-20 LAB — NOVEL CORONAVIRUS, NAA: SARS-CoV-2, NAA: NOT DETECTED

## 2021-02-20 NOTE — Telephone Encounter (Signed)
Pt called to enquire whether or not she should come in for her labs today.  Attempted to return pt's call to inform her that she should NOT come in for labs today as her COVID test was pending.  Call went to straight to VM.  Spoke with Dom at the front desk who stated that pt had already come in for her labs.  Tiajuana Amass, CMA

## 2021-02-22 DIAGNOSIS — R519 Headache, unspecified: Secondary | ICD-10-CM | POA: Diagnosis not present

## 2021-02-22 DIAGNOSIS — M1990 Unspecified osteoarthritis, unspecified site: Secondary | ICD-10-CM | POA: Diagnosis not present

## 2021-02-22 DIAGNOSIS — E119 Type 2 diabetes mellitus without complications: Secondary | ICD-10-CM | POA: Diagnosis not present

## 2021-02-22 DIAGNOSIS — M79604 Pain in right leg: Secondary | ICD-10-CM | POA: Diagnosis not present

## 2021-02-22 DIAGNOSIS — E079 Disorder of thyroid, unspecified: Secondary | ICD-10-CM | POA: Diagnosis not present

## 2021-02-22 DIAGNOSIS — Z7984 Long term (current) use of oral hypoglycemic drugs: Secondary | ICD-10-CM | POA: Diagnosis not present

## 2021-02-22 DIAGNOSIS — M79605 Pain in left leg: Secondary | ICD-10-CM | POA: Diagnosis not present

## 2021-02-22 DIAGNOSIS — I252 Old myocardial infarction: Secondary | ICD-10-CM | POA: Diagnosis not present

## 2021-02-22 DIAGNOSIS — R69 Illness, unspecified: Secondary | ICD-10-CM | POA: Diagnosis not present

## 2021-02-22 DIAGNOSIS — R531 Weakness: Secondary | ICD-10-CM | POA: Diagnosis not present

## 2021-02-22 DIAGNOSIS — J449 Chronic obstructive pulmonary disease, unspecified: Secondary | ICD-10-CM | POA: Diagnosis not present

## 2021-02-23 ENCOUNTER — Telehealth: Payer: Medicare HMO

## 2021-02-23 ENCOUNTER — Telehealth: Payer: Self-pay | Admitting: Pharmacist

## 2021-02-23 LAB — CBC WITH DIFFERENTIAL/PLATELET
Absolute Monocytes: 883 cells/uL (ref 200–950)
Basophils Absolute: 115 cells/uL (ref 0–200)
Basophils Relative: 0.9 %
Eosinophils Absolute: 230 cells/uL (ref 15–500)
Eosinophils Relative: 1.8 %
HCT: 44.8 % (ref 35.0–45.0)
Hemoglobin: 14.2 g/dL (ref 11.7–15.5)
Lymphs Abs: 3712 cells/uL (ref 850–3900)
MCH: 29.5 pg (ref 27.0–33.0)
MCHC: 31.7 g/dL — ABNORMAL LOW (ref 32.0–36.0)
MCV: 92.9 fL (ref 80.0–100.0)
MPV: 10.8 fL (ref 7.5–12.5)
Monocytes Relative: 6.9 %
Neutro Abs: 7859 cells/uL — ABNORMAL HIGH (ref 1500–7800)
Neutrophils Relative %: 61.4 %
Platelets: 281 10*3/uL (ref 140–400)
RBC: 4.82 10*6/uL (ref 3.80–5.10)
RDW: 11.2 % (ref 11.0–15.0)
Total Lymphocyte: 29 %
WBC: 12.8 10*3/uL — ABNORMAL HIGH (ref 3.8–10.8)

## 2021-02-23 LAB — THYROID PANEL WITH TSH
Free Thyroxine Index: 4.2 — ABNORMAL HIGH (ref 1.4–3.8)
T3 Uptake: 32 % (ref 22–35)
T4, Total: 13.2 ug/dL — ABNORMAL HIGH (ref 5.1–11.9)
TSH: 0.56 mIU/L (ref 0.40–4.50)

## 2021-02-23 LAB — COMPLETE METABOLIC PANEL WITH GFR
AG Ratio: 1.3 (calc) (ref 1.0–2.5)
ALT: 8 U/L (ref 6–29)
AST: 11 U/L (ref 10–35)
Albumin: 4.3 g/dL (ref 3.6–5.1)
Alkaline phosphatase (APISO): 87 U/L (ref 37–153)
BUN: 22 mg/dL (ref 7–25)
CO2: 26 mmol/L (ref 20–32)
Calcium: 10.4 mg/dL (ref 8.6–10.4)
Chloride: 103 mmol/L (ref 98–110)
Creat: 0.83 mg/dL (ref 0.60–1.00)
Globulin: 3.2 g/dL (calc) (ref 1.9–3.7)
Glucose, Bld: 151 mg/dL — ABNORMAL HIGH (ref 65–99)
Potassium: 4.5 mmol/L (ref 3.5–5.3)
Sodium: 138 mmol/L (ref 135–146)
Total Bilirubin: 0.2 mg/dL (ref 0.2–1.2)
Total Protein: 7.5 g/dL (ref 6.1–8.1)
eGFR: 75 mL/min/{1.73_m2} (ref >=60)

## 2021-02-23 LAB — HEPATIC FUNCTION PANEL
AG Ratio: 1.3 (calc) (ref 1.0–2.5)
ALT: 8 U/L (ref 6–29)
AST: 11 U/L (ref 10–35)
Albumin: 4.3 g/dL (ref 3.6–5.1)
Alkaline phosphatase (APISO): 87 U/L (ref 37–153)
Bilirubin, Direct: 0 mg/dL (ref 0.0–0.2)
Globulin: 3.2 g/dL (calc) (ref 1.9–3.7)
Indirect Bilirubin: 0.2 mg/dL (calc) (ref 0.2–1.2)
Total Bilirubin: 0.2 mg/dL (ref 0.2–1.2)
Total Protein: 7.5 g/dL (ref 6.1–8.1)

## 2021-02-23 LAB — HEMOGLOBIN A1C
Hgb A1c MFr Bld: 7.3 % of total Hgb — ABNORMAL HIGH (ref <5.7)
Mean Plasma Glucose: 163 mg/dL
eAG (mmol/L): 9 mmol/L

## 2021-02-23 LAB — URINALYSIS
Bilirubin Urine: NEGATIVE
Glucose, UA: NEGATIVE
Hgb urine dipstick: NEGATIVE
Ketones, ur: NEGATIVE
Nitrite: NEGATIVE
Protein, ur: NEGATIVE
Specific Gravity, Urine: 1.012 (ref 1.001–1.035)
pH: 5.5 (ref 5.0–8.0)

## 2021-02-23 LAB — PTH, INTACT AND CALCIUM
Calcium: 10.4 mg/dL (ref 8.6–10.4)
PTH: 29 pg/mL (ref 16–77)

## 2021-02-23 MED ORDER — LEVOTHYROXINE SODIUM 150 MCG PO TABS: ORAL_TABLET | ORAL | 1 refills | Status: AC

## 2021-02-23 NOTE — Chronic Care Management (AMB) (Signed)
  Care Management   Note  02/23/2021 Name: LAKIRA OGANDO MRN: [id number] DOB: 1951-04-16  Marvetta Gibbons is a 70 y.o. year old female who is a primary care patient of Nolene Ebbs and is actively engaged with the care management team. I reached out to Marvetta Gibbons by phone today to assist with re-scheduling an initial visit with the Pharmacist  Follow up plan: Unsuccessful telephone outreach attempt made. A HIPAA compliant phone message was left for the patient providing contact information and requesting a return call.   Burman Nieves, CCMA Care Guide, Embedded Care Coordination [facility]  Care Management  Direct Dial: [phone]

## 2021-02-23 NOTE — Telephone Encounter (Signed)
Unsuccessful attempt x2 to contact patient for initial phone call for CCM services with pharmacist. Patient was referred by CCM RN case manager, Kathyrn Sheriff for concerns of patient needing assistance with medication.  Will route to schedule team for reschedule.

## 2021-02-23 NOTE — Addendum Note (Signed)
Addended by: Hyman Hopes B on: 02/23/2021 05:26 PM   Modules accepted: Orders

## 2021-02-24 NOTE — Telephone Encounter (Signed)
Medication: desvenlafaxine (PRISTIQ) 100 MG 24 hr tablet Prior authorization determination received Medication has been approved Approval dates: 05/31/2020-05/30/2021  Patient aware via: MyChart Pharmacy aware: Yes Provider aware via this encounter

## 2021-02-25 ENCOUNTER — Ambulatory Visit (INDEPENDENT_AMBULATORY_CARE_PROVIDER_SITE_OTHER): Payer: Medicare HMO | Admitting: Physician Assistant

## 2021-02-25 ENCOUNTER — Other Ambulatory Visit: Payer: Self-pay

## 2021-02-25 VITALS — BP 159/92 | HR 98 | Ht 63.0 in | Wt 126.0 lb

## 2021-02-25 DIAGNOSIS — F419 Anxiety disorder, unspecified: Secondary | ICD-10-CM | POA: Diagnosis not present

## 2021-02-25 DIAGNOSIS — R531 Weakness: Secondary | ICD-10-CM

## 2021-02-25 DIAGNOSIS — R69 Illness, unspecified: Secondary | ICD-10-CM | POA: Diagnosis not present

## 2021-02-25 DIAGNOSIS — R5383 Other fatigue: Secondary | ICD-10-CM | POA: Diagnosis not present

## 2021-02-25 DIAGNOSIS — F5101 Primary insomnia: Secondary | ICD-10-CM

## 2021-02-25 MED ORDER — TRAZODONE HCL 50 MG PO TABS: 25.0000 mg | ORAL_TABLET | Freq: Every evening | ORAL | 0 refills | Status: DC | PRN

## 2021-02-25 MED ORDER — BUSPIRONE HCL 5 MG PO TABS
5.0000 mg | ORAL_TABLET | Freq: Three times a day (TID) | ORAL | 2 refills | Status: AC
Start: 2021-02-25 — End: ?

## 2021-02-25 MED ORDER — ALPRAZOLAM 0.25 MG PO TABS: 0.2500 mg | ORAL_TABLET | Freq: Two times a day (BID) | ORAL | 1 refills | Status: DC | PRN

## 2021-02-25 NOTE — Chronic Care Management (AMB) (Addendum)
  Care Management   Note  02/25/2021 Name: SEBRINA KESSNER MRN: [id number] DOB: 1951-02-09  Marvetta Gibbons is a 70 y.o. year old female who is a primary care patient of Nolene Ebbs and is actively engaged with the care management team. I reached out to Marvetta Gibbons by phone today to assist with re-scheduling an initial visit with the Pharmacist  Follow up plan: Telephone appointment with care management team member scheduled for: 03/02/2021  Burman Nieves, CCMA Care Guide, Embedded Care Coordination [facility]  Care Management  Direct Dial: [phone]

## 2021-02-25 NOTE — Progress Notes (Signed)
Subjective:    Patient ID: [name], female    DOB: Nov 19, 1950, 70 y.o.   MRN: [id number]  HPI Pt is a 70 yo female who presents to the clinic with husband with PMH of CAD, HTN, COPD, hypothyroidism, GERD, chronic pain due to OA and RA, anxiety and depression is here to follow up after ED visit on 02/22/2021 for weakness.   Her husband took her to the hospital for bilateral leg pain and weakness. CT of head negative. No evidence of encepholopathy, urine looked great.   Pt admits her anxiety and stress level have increased due to her brother moving in with them. She is requesting her xanax back. She has not had in 2 weeks. She states life is really difficult not having that.   .. Active Ambulatory Problems    Diagnosis Date Noted   Mixed hyperlipidemia 09/28/2007   HYPERCALCEMIA 11/13/2009   MDD (major depressive disorder), recurrent episode, mild (HCC) 09/28/2007   Chronic pain syndrome 06/05/2007   FATTY LIVER DISEASE 09/28/2007   ARTHRITIS, ACROMIOCLAVICULAR 07/31/2008   SPINAL STENOSIS IN CERVICAL REGION 07/31/2008   Osteoporosis 10/17/2007   Elevated liver function tests 09/28/2007   Allergic rhinitis 10/06/2010   Chronic headache 04/03/2011   Tobacco use 04/03/2011   Chronic pain 04/03/2011   Benign hypertension 04/03/2011   Sacroiliitis (HCC) 09/14/2011   Diabetes type 2, controlled (HCC) 03/14/2013   Hypothyroidism 03/14/2013   Trochanteric bursitis of right hip 05/04/2013   Anxiety state 05/16/2013   Ganglion cyst of wrist 07/13/2013   Acute pain of left shoulder 09/23/2013   COLD (chronic obstructive lung disease) (HCC) 12/06/2013   Hyperlipidemia 01/22/2015   COPD (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) 03/05/2015   Myocardial infarct (HCC) 03/09/2017   CAD (coronary artery disease) 03/09/2017   Rotator cuff tendonitis, left 03/10/2017   Anxiety 03/12/2017   Left upper arm swelling secondary to rotator cuff tear and fluid  tracking down biceps tendon sheath 04/14/2017   Common bile duct dilatation 04/27/2017   Lumbar degenerative disc disease 05/13/2017   Abnormal finding on GI tract imaging 05/25/2017   Chronic back pain 02/27/2016   Atherosclerosis of native coronary artery of native heart without angina pectoris 03/16/2016   RA (rheumatoid arthritis) (HCC) 07/06/2016   Acquired hypothyroidism 12/01/2015   Moderate episode of recurrent major depressive disorder (HCC) 07/24/2017   Bilateral calf pain 07/24/2017   Mild cognitive impairment 07/24/2017   Bilateral cataracts 09/07/2017   Dry eye syndrome of bilateral lacrimal glands 09/07/2017   Positive colorectal cancer screening using Cologuard test 10/07/2017   Microalbuminuria 10/07/2017   Weakness of both lower extremities 10/07/2017   History of diverticulitis 11/21/2017   Non-intractable vomiting with nausea 11/21/2017   Left lower quadrant guarding 11/21/2017   Generalized abdominal pain 11/21/2017   Vasovagal syncope 12/16/2017   Vitamin D deficiency 04/05/2018   Primary insomnia 04/05/2018   Hiatal hernia 04/05/2018   Primary osteoarthritis 07/14/2018   Gastroesophageal reflux disease with esophagitis 07/14/2018   Fixed pupil of both eyes 07/18/2018   Frequent headaches 07/18/2018   Unsteady gait 07/18/2018   Fatigue 12/11/2018   Excessive sleepiness 05/15/2019   No appetite 05/15/2019   SIRS (systemic inflammatory response syndrome) (HCC) 06/04/2019   Sepsis (HCC) 06/04/2019   Bilateral hand swelling 07/16/2019   Bilateral hand pain 07/16/2019   Arthralgia of lower leg 10/22/2019   Left lumbar radiculopathy 11/05/2019   Chronic upper neck pain 12/18/2019   Polypharmacy 08/04/2020  Declined smoking cessation 08/05/2020   Confusion 11/25/2020   Medication non-compliance due to excessive pill burden 11/28/2020   Intertrigo 01/05/2021   Elevated blood pressure reading in office with diagnosis of hypertension 01/13/2021   Resolved  Ambulatory Problems    Diagnosis Date Noted   DM (diabetes mellitus) (HCC) 06/05/2007   HEMORRHOIDS, WITH BLEEDING 04/22/2008   Acute maxillary sinusitis 04/10/2010   Alcoholic fatty liver 12/24/2009   RENAL INSUFFICIENCY, ACUTE 10/23/2008   ROTATOR CUFF SYNDROME, RIGHT 04/01/2008   ALLERGIC REACTION 12/24/2009   ELEVATED BLOOD PRESSURE WITHOUT DIAGNOSIS OF HYPERTENSION 05/13/2010   CHRONIC OBSTRUCTIVE PULMONARY DISEASE, ACUTE EXACERBATION 08/14/2010   Recurrent acute sinusitis 09/09/2010   Allergic dermatitis due to poison ivy 11/27/2010   DERMATITIS, ATOPIC 01/15/2011   Arthritis of carpometacarpal joint 02/14/2012   Ganglion cyst of left foot 07/13/2013   Chronic pain syndrome 12/05/2013   Primary osteoarthritis of left wrist 12/10/2014   Rheumatoid arthritis involving both hands (HCC) 03/09/2017   Bulging lumbar disc 02/27/2016   Pyelonephritis 11/21/2017   Upper abdominal pain 03/10/2018   Patellar sleeve fracture, right, closed, initial encounter 04/24/2018   Past Medical History:  Diagnosis Date   Cervicalgia    COPD (chronic obstructive pulmonary disease) (HCC)    DDD (degenerative disc disease), lumbar    Diabetes mellitus    Fatty liver    Narcotic abuse (HCC)    Osteoarthritis    Osteoporosis    Rheumatoid arthritis (HCC)    Thyroid disease          Review of Systems See HPI.     Objective:   Physical Exam Vitals reviewed.  Constitutional:      Appearance: Normal appearance.  HENT:     Head: Normocephalic.  Cardiovascular:     Rate and Rhythm: Normal rate and regular rhythm.  Pulmonary:     Effort: Pulmonary effort is normal.     Breath sounds: Normal breath sounds.  Musculoskeletal:     Right lower leg: No edema.     Left lower leg: No edema.  Neurological:     General: No focal deficit present.     Mental Status: She is alert and oriented to person, place, and time.  Psychiatric:        Mood and Affect: Mood normal.           Assessment & Plan:  Marland KitchenMarland KitchenDiane was seen today for hospitalization follow-up.  Diagnoses and all orders for this visit:  Anxiety -     busPIRone (BUSPAR) 5 MG tablet; Take 1 tablet (5 mg total) by mouth 3 (three) times daily. -     ALPRAZolam (XANAX) 0.25 MG tablet; Take 1 tablet (0.25 mg total) by mouth 2 (two) times daily as needed for anxiety.  Weakness  Primary insomnia -     traZODone (DESYREL) 50 MG tablet; Take 0.5-2 tablets (25-100 mg total) by mouth at bedtime as needed for sleep.  Fatigue, unspecified type   7/28 last xanax given.  Marland KitchenMarland KitchenPDMP reviewed during this encounter. Discussed risk of xanax in elderly and how it can make worse.  Gave a very small quanity for as needed usage and to continue to decrease.  Buspar added for anxiety.  Make sure taking pristiq.  Trazdone added for sleep.  Follow up in 2 weeks.

## 2021-03-02 ENCOUNTER — Telehealth: Payer: Self-pay | Admitting: Pharmacist

## 2021-03-02 ENCOUNTER — Telehealth: Payer: Medicare HMO

## 2021-03-02 NOTE — Chronic Care Management (AMB) (Signed)
Chronic Care Management Pharmacy Assistant   Name: [name]  MRN: [id number] DOB: [birth year]  [name] is an 70 y.o. year old female who presents for his initial CCM visit with the clinical pharmacist.  Recent office visits:  02/25/21-Melissa Diannia Ruder, PA-C (PCP) Seen for hospital follow up. Follow up in 2 months. 02/19/21-Melissa B. Reola Calkins, NP (Video visit). Seen for fatigue. Flu test completed. Return if symptoms worsen or fail to improve. 01/07/21-Melissa L. Caleen Essex, PA-C (PCP) [name] or medication non-compliance due to excessive pill burden. Follow up in 2 months. 12/31/20-Melissa L. Caleen Essex, PA-C (PCP) Seen for a rash. Ambulatory referral to Ophthalmology and Ambulatory referral to Gastroenterology. Start on nystatin ointment (MYCOSTATIN) Apply 1 application topically 2 (two) times daily. Follow up in 2 months. 11/28/20-Melissa L. Caleen Essex, PA-C (PCP) Seen for general follow up. Labs ordered.  Discontinue: methotrexate (RHEUMATREX) 15 MG tablet.  Ambulatory referral to Gastroenterology. Start on diclofenac sodium 2 % SOLN Apply 2 Pump topically 2 (two) times daily. Follow up in 3 months. 11/26/20-Melissa L. Caleen Essex, PA-C (PCP) Notes not available. 11/25/20-[name] (PCP, Video Visit) Seen for fatigue. Labs ordered. Start decreasing on Xanax. Stop trazodone.   Recent consult visits:  09/06/20-[name] (Gerontology) Notes not available.  Hospital visits:  Medication Reconciliation was completed by comparing discharge summary, patient's EMR and Pharmacy list, and upon discussion with patient.  Admitted to the hospital on 02/22/21 due to Leg pain. Discharge date was 02/22/21. Discharged from [facility]    New?Medications Started at [facility] Discharge:?? -started None noted  Medication Changes at Hospital Discharge: -Changed None noted  Medications Discontinued at Hospital Discharge: -Stopped None noted  Medications that remain the same after Hospital  Discharge:??  -All other medications will remain the same.     Medication Reconciliation was completed by comparing discharge summary, patient's EMR and Pharmacy list, and upon discussion with patient.  Admitted to the hospital on 12/29/20 due to Chest pain. Discharge date was 12/29/20. Discharged from [facility].    New?Medications Started at [facility] Discharge:?? -started None noted  Medication Changes at Hospital Discharge: -Changed None noted  Medications Discontinued at Hospital Discharge: -Stopped None noted  Medications that remain the same after Hospital Discharge:??  -All other medications will remain the same.     Medication Reconciliation was completed by comparing discharge summary, patient's EMR and Pharmacy list, and upon discussion with patient.  Admitted to the hospital on 10/13/20 due to left against medical advice. Discharge date was 10/13/20. Discharged from [facility].    New?Medications Started at [facility] Discharge:?? -started none noted  Medication Changes at Hospital Discharge: -Changed none noted  Medications Discontinued at Hospital Discharge: -Stopped none noted  Medications that remain the same after Hospital Discharge:??  -All other medications will remain the same.    Medications: Outpatient Encounter Medications as of 03/02/2021  Medication Sig   alendronate (FOSAMAX) 70 MG tablet TAKE ONE TABLET EVERY 7 DAYS WITH A FULL GLASS OF WATER ON AN EMPTY STOMACH (Patient not taking: Reported on 02/25/2021)   ALPRAZolam (XANAX) 0.25 MG tablet Take 1 tablet (0.25 mg total) by mouth 2 (two) times daily as needed for anxiety.   atorvastatin (LIPITOR) 80 MG tablet Take 1 tablet (80 mg total) by mouth daily. (Patient not taking: No sig reported)   buPROPion (WELLBUTRIN XL) 150 MG 24 hr tablet TAKE 1 TABLET BY MOUTH EVERY DAY IN THE MORNING   busPIRone (BUSPAR) 5 MG tablet Take 1 tablet (  5 mg total) by mouth 3 (three) times daily.    desvenlafaxine (PRISTIQ) 100 MG 24 hr tablet Take 1 tablet (100 mg total) by mouth daily.   Diclofenac Sodium 1 % CREA Apply 1 application topically in the morning and at bedtime.   gabapentin (NEURONTIN) 600 MG tablet TAKE 1 TABLET BY MOUTH THREE TIMES A DAY   hydroxychloroquine (PLAQUENIL) 200 MG tablet Take one tablet by mouth daily.   levothyroxine (SYNTHROID) 150 MCG tablet 1 TAB (150 MCG) BY MOUTH EVERY SUNDAY, MONDAY, WEDNESDAY, Friday, SATURDAY. 2 TABS (300 MCG) BY MOUTH EVERY TUESDAY, THURSDAY.   metFORMIN (GLUCOPHAGE) 1000 MG tablet Take 1 tablet (1,000 mg total) by mouth 2 (two) times daily with a meal.   predniSONE (DELTASONE) 5 MG tablet TAKE 1 TABLET BY MOUTH EVERY DAY WITH BREAKFAST   traZODone (DESYREL) 50 MG tablet Take 0.5-2 tablets (25-100 mg total) by mouth at bedtime as needed for sleep.   No facility-administered encounter medications on file as of 03/02/2021.   Alendronate (FOSAMAX) 70 MG tablet Last filled:11/25/20 84 DS ALPRAZolam (XANAX) 0.25 MG tablet Last filled:02/25/21 15 DS Atorvastatin (LIPITOR) 80 MG tablet Last filled:02/01/21 90 DS BuPROPion (WELLBUTRIN XL) 150 MG 24 hr tablet Last filled:01/07/21 90 DS BusPIRone (BUSPAR) 5 MG tablet Last filled:02/25/21 30 DS Desvenlafaxine (PRISTIQ) 100 MG 24 hr tablet Last filled:08/14/20 90 DS Diclofenac Sodium 1 % CREA Last filled:12/24/20 30 DS Gabapentin (NEURONTIN) 600 MG tablet Last filled:02/23/21 30 DS Hydroxychloroquine (PLAQUENIL) 200 MG tablet Last filled:01/07/21 90 DS Levothyroxine (SYNTHROID) 150 MCG tablet Last filled:02/01/21 90 DS MetFORMIN (GLUCOPHAGE) 1000 MG tablet Last filled:08/09/20 90 DS PredniSONE (DELTASONE) 5 MG tablet Last filled:02/09/21 90 DS TraZODone (DESYREL) 50 MG tablet Last filled:02/25/21 30 DS   Care Gaps: URINE MICROALBUMIN:Last completed: Oct 04, 2017 FOOT EXAM:Last completed: Feb 12, 2019 OPHTHALMOLOGY EXAM:Last ordered: Jan 05, 2021  Star Rating Drugs: Atorvastatin (LIPITOR)  80 MG tablet Last filled:02/01/21 90 DS  [name], RMA Health Concierge

## 2021-03-04 ENCOUNTER — Ambulatory Visit (INDEPENDENT_AMBULATORY_CARE_PROVIDER_SITE_OTHER): Payer: Medicare HMO

## 2021-03-04 DIAGNOSIS — E119 Type 2 diabetes mellitus without complications: Secondary | ICD-10-CM

## 2021-03-04 DIAGNOSIS — I10 Essential (primary) hypertension: Secondary | ICD-10-CM

## 2021-03-04 NOTE — Progress Notes (Signed)
Unable to reach pt by phone.  Letter mailed to pt with results and recommendations.  Tiajuana Amass, CMA

## 2021-03-04 NOTE — Patient Instructions (Signed)
Visit Information  PATIENT GOALS:  Goals Addressed             This Visit's Progress    Monitor and Manage My Diabetes and Health Conditions   On track    Timeframe:  Long-Range Goal Priority:  Medium Start Date: 11/26/20                            Expected End Date: 05/08/21                      Follow Up Date 04/01/21   Patient Goals: - attend provider visits as scheduled - obtain glucose meter and begin to check your blood sugar per provider recommendation - check blood sugar per provider recommendation and as needed, if I feel it is too high or too low - continue fall prevention strategies: change position slowly; keep walkways clear, have good lighting in the room, keep provider appointments, take medications as prescribed, maintain good muscle strength/tone. - call your provider if your condition does not improve or worsens and with questions or concerns - communicate with clinical pharmacist regarding medications.         Patient verbalizes understanding of instructions provided today and agrees to view in MyChart.   Telephone follow up appointment with care management team member scheduled for: 04/01/21 The patient has been provided with contact information for the care management team and has been advised to call with any health related questions or concerns.   Kathyrn Sheriff, RN, MSN, BSN, CCM Care Management Coordinator [name] [phone]

## 2021-03-04 NOTE — Chronic Care Management (AMB) (Signed)
Chronic Care Management   CCM RN Visit Note  03/04/2021 Name: [name] MRN: [id number] DOB: [birth year]  Subjective: [name] is a 70 y.o. year old female who is a primary care patient of Donella Stade, PA-C. The care management team was consulted for assistance with disease management and care coordination needs.    Engaged with patient by telephone for follow up visit in response to provider referral for case management and/or care coordination services.   Consent to Services:  The patient was given information about Chronic Care Management services, agreed to services, and gave verbal consent prior to initiation of services.  Please see initial visit note for detailed documentation.   Patient agreed to services and verbal consent obtained.   Assessment: Review of patient past medical history, allergies, medications, health status, including review of consultants reports, laboratory and other test data, was performed as part of comprehensive evaluation and provision of chronic care management services.   SDOH (Social Determinants of Health) assessments and interventions performed:    CCM Care Plan  Allergies  Allergen Reactions   Codeine Palpitations and Other (See Comments)   Naproxen Rash and Palpitations    GI upset   Budesonide-Formoterol Fumarate Rash    REACTION: rash REACTION: rash REACTION: rash   Duloxetine Hcl Other (See Comments)    No difference for pain or anti-depressant.  Other reaction(s): Other No difference for pain or anti-depressant. No difference for pain or anti-depressant.   Fentanyl Other (See Comments)    REACTION: severe aggitation, mood changes Other reaction(s): Hallucination, Psychiatric Hallucinations Hallucinations REACTION: severe aggitation, mood changes    Tramadol-Acetaminophen     Other reaction(s): Unknown   Effexor Xr [Venlafaxine Hcl Er]     Felt more anxious or no difference.    Fluticasone-Salmeterol    Lisinopril  Rash    Maybe we dont really know    Outpatient Encounter Medications as of 03/04/2021  Medication Sig Note   alendronate (FOSAMAX) 70 MG tablet TAKE ONE TABLET EVERY 7 DAYS WITH A FULL GLASS OF WATER ON AN EMPTY STOMACH    ALPRAZolam (XANAX) 0.25 MG tablet Take 1 tablet (0.25 mg total) by mouth 2 (two) times daily as needed for anxiety. 03/04/2021: Currently taking once a day, but states she will be out in a couple of days.   atorvastatin (LIPITOR) 80 MG tablet Take 1 tablet (80 mg total) by mouth daily.    buPROPion (WELLBUTRIN XL) 150 MG 24 hr tablet TAKE 1 TABLET BY MOUTH EVERY DAY IN THE MORNING    busPIRone (BUSPAR) 5 MG tablet Take 1 tablet (5 mg total) by mouth 3 (three) times daily.    desvenlafaxine (PRISTIQ) 100 MG 24 hr tablet Take 1 tablet (100 mg total) by mouth daily.    gabapentin (NEURONTIN) 600 MG tablet TAKE 1 TABLET BY MOUTH THREE TIMES A DAY    hydroxychloroquine (PLAQUENIL) 200 MG tablet Take one tablet by mouth daily.    levothyroxine (SYNTHROID) 150 MCG tablet 1 TAB (150 MCG) BY MOUTH EVERY SUNDAY, MONDAY, WEDNESDAY, Friday, SATURDAY. 2 TABS (300 MCG) BY MOUTH EVERY TUESDAY, THURSDAY.    metFORMIN (GLUCOPHAGE) 1000 MG tablet Take 1 tablet (1,000 mg total) by mouth 2 (two) times daily with a meal.    predniSONE (DELTASONE) 5 MG tablet TAKE 1 TABLET BY MOUTH EVERY DAY WITH BREAKFAST    traZODone (DESYREL) 50 MG tablet Take 0.5-2 tablets (25-100 mg total) by mouth at bedtime as needed for sleep.  Diclofenac Sodium 1 % CREA Apply 1 application topically in the morning and at bedtime.    No facility-administered encounter medications on file as of 03/04/2021.    Patient Active Problem List   Diagnosis Date Noted   Elevated blood pressure reading in office with diagnosis of hypertension 01/13/2021   Intertrigo 01/05/2021   Medication non-compliance due to excessive pill burden 11/28/2020   Confusion 11/25/2020   Declined smoking cessation 08/05/2020   Polypharmacy  08/04/2020   Chronic upper neck pain 12/18/2019   Left lumbar radiculopathy 11/05/2019   Arthralgia of lower leg 10/22/2019   Bilateral hand swelling 07/16/2019   Bilateral hand pain 07/16/2019   SIRS (systemic inflammatory response syndrome) (HCC) 06/04/2019   Sepsis ([place]) 06/04/2019   Excessive sleepiness 05/15/2019   No appetite 05/15/2019   Fatigue 12/11/2018   Fixed pupil of both eyes 07/18/2018   Frequent headaches 07/18/2018   Unsteady gait 07/18/2018   Primary osteoarthritis 07/14/2018   Gastroesophageal reflux disease with esophagitis 07/14/2018   Vitamin D deficiency 04/05/2018   Primary insomnia 04/05/2018   Hiatal hernia 04/05/2018   Vasovagal syncope 12/16/2017   History of diverticulitis 11/21/2017   Non-intractable vomiting with nausea 11/21/2017   Left lower quadrant guarding 11/21/2017   Generalized abdominal pain 11/21/2017   Positive colorectal cancer screening using Cologuard test 10/07/2017   Microalbuminuria 10/07/2017   Weakness of both lower extremities 10/07/2017   Bilateral cataracts 09/07/2017   Dry eye syndrome of bilateral lacrimal glands 09/07/2017   Moderate episode of recurrent major depressive disorder ([place]) 07/24/2017   Bilateral calf pain 07/24/2017   Mild cognitive impairment 07/24/2017   Abnormal finding on GI tract imaging 05/25/2017   Lumbar degenerative disc disease 05/13/2017   Common bile duct dilatation 04/27/2017   Left upper arm swelling secondary to rotator cuff tear and fluid tracking down biceps tendon sheath 04/14/2017   Anxiety 03/12/2017   Rotator cuff tendonitis, left 03/10/2017   Myocardial infarct ([place]) 03/09/2017   CAD (coronary artery disease) 03/09/2017   RA (rheumatoid arthritis) ([place]) 07/06/2016   Atherosclerosis of native coronary artery of native heart without angina pectoris 03/16/2016   Chronic back pain 02/27/2016   Acquired hypothyroidism 12/01/2015   COPD (chronic obstructive pulmonary disease) with chronic  bronchitis ([place]) 03/05/2015   Hyperlipidemia 01/22/2015   COLD (chronic obstructive lung disease) ([place]) 12/06/2013   Acute pain of left shoulder 09/23/2013   Ganglion cyst of wrist 07/13/2013   Anxiety state 05/16/2013   Trochanteric bursitis of right hip 05/04/2013   Diabetes type 2, controlled ([place]) 03/14/2013   Hypothyroidism 03/14/2013   Sacroiliitis ([place]) 09/14/2011   Chronic headache 04/03/2011   Tobacco use 04/03/2011   Chronic pain 04/03/2011   Benign hypertension 04/03/2011   Allergic rhinitis 10/06/2010   HYPERCALCEMIA 11/13/2009   ARTHRITIS, ACROMIOCLAVICULAR 07/31/2008   SPINAL STENOSIS IN CERVICAL REGION 07/31/2008   Osteoporosis 10/17/2007   Mixed hyperlipidemia 09/28/2007   MDD (major depressive disorder), recurrent episode, mild ([place]) 09/28/2007   FATTY LIVER DISEASE 09/28/2007   Elevated liver function tests 09/28/2007   Chronic pain syndrome 06/05/2007    Conditions to be addressed/monitored:CAD, HTN, DMII, and history of fall, mild cognitive impairment.  Care Plan : Diabetes Type 2 (Adult)  Updates made by Luretha Rued, RN since 03/04/2021 12:00 AM     Problem: Developement of long term care plan for management of diabetes in a patient with cardiovascular disease   Priority: Medium     Long-Range Goal: Disease Progression Prevented or  Minimized   Start Date: 11/26/2020  Expected End Date: 05/08/2021  Priority: Medium  Note:   Objective:  Lab Results  Component Value Date   HGBA1C 7.3 (H) 02/20/2021   Lab Results  Component Value Date   CREATININE 0.83 02/20/2021   CREATININE 0.95 (H) 11/28/2020   CREATININE 0.68 08/19/2020  Objective:  Last practice recorded BP readings:  BP Readings from Last 3 Encounters:  02/25/21 (!) 159/92  02/19/21 (!) 149/81  01/07/21 (!) 172/72   Most recent eGFR/CrCl:  Lab Results  Component Value Date   EGFR 75 02/20/2021  Current Barriers:  Knowledge Deficits related to long term care plan for management of  diabetes in a patient with HTN, CAD, history of MI, mild cognitive impairment. 70 yr old lives with husband. Patient denies dizziness at this time. She reports she has not had a fall in the past month. She denies any issues with confusion at this time. A1C 7.3 increased from 7.0 on 08/01/20. Patient reports she does not feel she needs Desvenlafaxine because she is not depressed, but will discuss with primary care provider. RNCM encouraged patient to speak with provider and reinforced that the medication is probably helping to relieve signs/symptoms. Patient reports concern about discontinuing Alprazolam. RNCM offered referral to LCSW regarding anxiety. Patient declined.   Does not have glucometer  Does not adhere to prescribed medication regimen Case Manager Clinical Goal(s):  patient will demonstrate improved adherence to prescribed treatment plan for diabetes self care/management as evidenced by: increased monitoring of blood sugars per provider recommendation and adherence to prescribed medication regimen Interventions:  Collaboration with Donella Stade, PA-C regarding development and update of comprehensive plan of care as evidenced by provider attestation and co-signature Inter-disciplinary care team collaboration (see longitudinal plan of care) Reviewed medications with patient. Reinforced clinical pharmacist will call to review/discuss medications.  Coordination with provider regarding glucose meter.  Reviewed scheduled/upcoming provider appointments: per after visit summary, patient to be seen around 04/27/21. RNCM informed patient. Ms. Rask states she will call to schedule. Discussed plans with patient for ongoing care management follow up and provided patient with direct contact information for care management team Patient Goals: - attend provider visits as scheduled - obtain glucose meter and begin to check your blood sugar per provider recommendation - check blood sugar per provider  recommendation and as needed, if I feel it is too high or too low - continue fall prevention strategies: change position slowly; keep walkways clear, have good lighting in the room, keep provider appointments, take medications as prescribed, maintain good muscle strength/tone. - call your provider if your condition does not improve or worsens and with questions or concerns - communicate with clinical pharmacist regarding medications.  Follow Up Plan: Telephone follow up appointment with care management team member scheduled for: next month The patient has been provided with contact information for the care management team and has been advised to call with any health related questions or concerns.     Plan:Telephone follow up appointment with care management team member scheduled for:  next month and The patient has been provided with contact information for the care management team and has been advised to call with any health related questions or concerns.   Thea Silversmith, RN, MSN, BSN, CCM Care Management Coordinator [facility] [phone]

## 2021-03-05 DIAGNOSIS — E1122 Type 2 diabetes mellitus with diabetic chronic kidney disease: Secondary | ICD-10-CM | POA: Diagnosis not present

## 2021-03-05 DIAGNOSIS — R4182 Altered mental status, unspecified: Secondary | ICD-10-CM | POA: Diagnosis not present

## 2021-03-05 DIAGNOSIS — I129 Hypertensive chronic kidney disease with stage 1 through stage 4 chronic kidney disease, or unspecified chronic kidney disease: Secondary | ICD-10-CM | POA: Diagnosis not present

## 2021-03-05 DIAGNOSIS — G9340 Encephalopathy, unspecified: Secondary | ICD-10-CM | POA: Diagnosis not present

## 2021-03-05 DIAGNOSIS — Z20822 Contact with and (suspected) exposure to covid-19: Secondary | ICD-10-CM | POA: Diagnosis not present

## 2021-03-05 DIAGNOSIS — R0602 Shortness of breath: Secondary | ICD-10-CM | POA: Diagnosis not present

## 2021-03-05 DIAGNOSIS — I6523 Occlusion and stenosis of bilateral carotid arteries: Secondary | ICD-10-CM | POA: Diagnosis not present

## 2021-03-05 DIAGNOSIS — E871 Hypo-osmolality and hyponatremia: Secondary | ICD-10-CM | POA: Diagnosis not present

## 2021-03-05 DIAGNOSIS — R29818 Other symptoms and signs involving the nervous system: Secondary | ICD-10-CM | POA: Diagnosis not present

## 2021-03-05 DIAGNOSIS — R410 Disorientation, unspecified: Secondary | ICD-10-CM | POA: Diagnosis not present

## 2021-03-05 DIAGNOSIS — J441 Chronic obstructive pulmonary disease with (acute) exacerbation: Secondary | ICD-10-CM | POA: Diagnosis not present

## 2021-03-05 DIAGNOSIS — R9082 White matter disease, unspecified: Secondary | ICD-10-CM | POA: Diagnosis not present

## 2021-03-05 DIAGNOSIS — N179 Acute kidney failure, unspecified: Secondary | ICD-10-CM | POA: Diagnosis not present

## 2021-03-05 DIAGNOSIS — J9602 Acute respiratory failure with hypercapnia: Secondary | ICD-10-CM | POA: Diagnosis not present

## 2021-03-05 DIAGNOSIS — R479 Unspecified speech disturbances: Secondary | ICD-10-CM | POA: Diagnosis not present

## 2021-03-05 DIAGNOSIS — J9601 Acute respiratory failure with hypoxia: Secondary | ICD-10-CM | POA: Diagnosis not present

## 2021-03-06 ENCOUNTER — Telehealth: Payer: Medicare HMO

## 2021-03-06 DIAGNOSIS — J441 Chronic obstructive pulmonary disease with (acute) exacerbation: Secondary | ICD-10-CM | POA: Diagnosis not present

## 2021-03-06 DIAGNOSIS — Z20822 Contact with and (suspected) exposure to covid-19: Secondary | ICD-10-CM | POA: Diagnosis not present

## 2021-03-06 DIAGNOSIS — D72829 Elevated white blood cell count, unspecified: Secondary | ICD-10-CM | POA: Insufficient documentation

## 2021-03-06 DIAGNOSIS — J9601 Acute respiratory failure with hypoxia: Secondary | ICD-10-CM | POA: Insufficient documentation

## 2021-03-06 DIAGNOSIS — E871 Hypo-osmolality and hyponatremia: Secondary | ICD-10-CM | POA: Diagnosis not present

## 2021-03-06 DIAGNOSIS — R410 Disorientation, unspecified: Secondary | ICD-10-CM | POA: Diagnosis not present

## 2021-03-06 DIAGNOSIS — I080 Rheumatic disorders of both mitral and aortic valves: Secondary | ICD-10-CM | POA: Diagnosis not present

## 2021-03-06 NOTE — Progress Notes (Deleted)
Current Barriers:  {PHARMCACYBARRIERS:21091514}  Pharmacist Clinical Goal(s):  Over the next *** days, patient will {PHARMACYGOALCHOICES:25079} through collaboration with PharmD and provider.   Interventions: 1:1 collaboration with [name], PA-C regarding development and update of comprehensive plan of care as evidenced by provider attestation and co-signature Inter-disciplinary care team collaboration (see longitudinal plan of care) Comprehensive medication review performed; medication list updated in electronic medical record  {PHARMACYDISEASECHOICES:21091512}  Patient Goals/Self-Care Activities Over the next *** days, patient will:  {PHARMACYPATIENTGOALS:25081}  Follow Up Plan: {CM FOLLOW UP YBOF:75102} ***   Care Management   Pharmacy Note  03/06/2021 Name: [name] MRN: [id number] DOB: Oct 20, 1950  Subjective: [name] is a 69 y.o. year old female who is a primary care patient of [name], PA-C. The Care Management team was consulted for assistance with care management and care coordination needs.    {CCMTELEPHONEFACETOFACE:21091510} for {CCMINITIALFOLLOWUPCHOICE:21091511} in response to provider referral for pharmacy case management and/or care coordination services.   The patient was given information about Care Management services today including:  Care Management services includes personalized support from designated clinical staff supervised by the patient's primary care provider, including individualized plan of care and coordination with other care providers. 24/7 contact phone numbers for assistance for urgent and routine care needs. The patient may stop case management services at any time by phone call to the office staff.  Patient agreed to services and consent obtained.  Assessment:  Review of patient status, including review of consultants reports, laboratory and other test data, was performed as part of comprehensive evaluation and  provision of chronic care management services.   SDOH (Social Determinants of Health) assessments and interventions performed:    Objective:  Lab Results  Component Value Date   CREATININE 0.83 02/20/2021   CREATININE 0.95 (H) 11/28/2020   CREATININE 0.68 08/19/2020    Lab Results  Component Value Date   HGBA1C 7.3 (H) 02/20/2021       Component Value Date/Time   CHOL 186 12/13/2018 1349   TRIG 138 12/13/2018 1349   HDL 83 12/13/2018 1349   CHOLHDL 2.2 12/13/2018 1349   VLDL 25 12/10/2014 1354   LDLCALC 79 12/13/2018 1349   LDLDIRECT 84 01/01/2011 1319    Other: (TSH, CBC, Vit D, etc.)  Clinical ASCVD: {YES/NO:21197} The ASCVD Risk score (Arnett DK, et al., 2019) failed to calculate for the following reasons:   The patient has a prior MI or stroke diagnosis    Other: (CHADS2VASc if Afib, PHQ9 if depression, MMRC or CAT for COPD, ACT, DEXA)  BP Readings from Last 3 Encounters:  02/25/21 (!) 159/92  02/19/21 (!) 149/81  01/07/21 (!) 172/72    Care Plan  Allergies  Allergen Reactions   Codeine Palpitations and Other (See Comments)   Naproxen Rash and Palpitations    GI upset   Budesonide-Formoterol Fumarate Rash    REACTION: rash REACTION: rash REACTION: rash   Duloxetine Hcl Other (See Comments)    No difference for pain or anti-depressant.  Other reaction(s): Other No difference for pain or anti-depressant. No difference for pain or anti-depressant.   Fentanyl Other (See Comments)    REACTION: severe aggitation, mood changes Other reaction(s): Hallucination, Psychiatric Hallucinations Hallucinations REACTION: severe aggitation, mood changes    Tramadol-Acetaminophen     Other reaction(s): Unknown   Effexor Xr [Venlafaxine Hcl Er]     Felt more anxious or no difference.    Fluticasone-Salmeterol    Lisinopril Rash    Maybe  we dont really know    Medications Reviewed Today     Reviewed by Colletta Maryland, RN (Registered Nurse) on 03/04/21 at  1648  Med List Status: <None>   Medication Order Taking? Sig Documenting Provider Last Dose Status Informant  alendronate (FOSAMAX) 70 MG tablet [id number] Yes TAKE ONE TABLET EVERY 7 DAYS WITH A FULL GLASS OF WATER ON AN EMPTY STOMACH Breeback, Jade L, PA-C Taking Active   ALPRAZolam (XANAX) 0.25 MG tablet [id number] Yes Take 1 tablet (0.25 mg total) by mouth 2 (two) times daily as needed for anxiety. [name], PA-C Taking Active            Med Note Earlene Plater, [place]   Wed Mar 04, 2021  1:20 PM) Currently taking once a day, but states she will be out in a couple of days.  atorvastatin (LIPITOR) 80 MG tablet [id number] Yes Take 1 tablet (80 mg total) by mouth daily. [name], PA-C Taking Active   buPROPion (WELLBUTRIN XL) 150 MG 24 hr tablet [id number] Yes TAKE 1 TABLET BY MOUTH EVERY DAY IN THE MORNING Breeback, Jade L, PA-C Taking Active   busPIRone (BUSPAR) 5 MG tablet [id number] Yes Take 1 tablet (5 mg total) by mouth 3 (three) times daily. [name], PA-C Taking Active   desvenlafaxine (PRISTIQ) 100 MG 24 hr tablet [id number] Yes Take 1 tablet (100 mg total) by mouth daily. [name], PA-C Taking Active   Diclofenac Sodium 1 % CREA [id number]  Apply 1 application topically in the morning and at bedtime. [name], PA-C  Active   gabapentin (NEURONTIN) 600 MG tablet [id number] Yes TAKE 1 TABLET BY MOUTH THREE TIMES A DAY Breeback, Jade L, PA-C Taking Active   hydroxychloroquine (PLAQUENIL) 200 MG tablet [id number] Yes Take one tablet by mouth daily. [name], PA-C Taking Active   levothyroxine (SYNTHROID) 150 MCG tablet [id number] Yes 1 TAB (150 MCG) BY MOUTH EVERY SUNDAY, MONDAY, WEDNESDAY, Friday, SATURDAY. 2 TABS (300 MCG) BY MOUTH EVERY TUESDAY, THURSDAY. Clayborne Dana, NP Taking Active   metFORMIN (GLUCOPHAGE) 1000 MG tablet [id number] Yes Take 1 tablet (1,000 mg total) by mouth 2 (two) times daily with a meal. Breeback, Jade L, PA-C Taking Active    predniSONE (DELTASONE) 5 MG tablet [id number] Yes TAKE 1 TABLET BY MOUTH EVERY DAY WITH BREAKFAST Breeback, Jade L, PA-C Taking Active   traZODone (DESYREL) 50 MG tablet [id number] Yes Take 0.5-2 tablets (25-100 mg total) by mouth at bedtime as needed for sleep. [name], PA-C Taking Active             Patient Active Problem List   Diagnosis Date Noted   Elevated blood pressure reading in office with diagnosis of hypertension 01/13/2021   Intertrigo 01/05/2021   Medication non-compliance due to excessive pill burden 11/28/2020   Confusion 11/25/2020   Declined smoking cessation 08/05/2020   Polypharmacy 08/04/2020   Chronic upper neck pain 12/18/2019   Left lumbar radiculopathy 11/05/2019   Arthralgia of lower leg 10/22/2019   Bilateral hand swelling 07/16/2019   Bilateral hand pain 07/16/2019   SIRS (systemic inflammatory response syndrome) (HCC) 06/04/2019   Sepsis (HCC) 06/04/2019   Excessive sleepiness 05/15/2019   No appetite 05/15/2019   Fatigue 12/11/2018   Fixed pupil of both eyes 07/18/2018   Frequent headaches 07/18/2018   Unsteady gait 07/18/2018   Primary osteoarthritis 07/14/2018   Gastroesophageal reflux disease with esophagitis 07/14/2018   Vitamin D deficiency 04/05/2018  Primary insomnia 04/05/2018   Hiatal hernia 04/05/2018   Vasovagal syncope 12/16/2017   History of diverticulitis 11/21/2017   Non-intractable vomiting with nausea 11/21/2017   Left lower quadrant guarding 11/21/2017   Generalized abdominal pain 11/21/2017   Positive colorectal cancer screening using Cologuard test 10/07/2017   Microalbuminuria 10/07/2017   Weakness of both lower extremities 10/07/2017   Bilateral cataracts 09/07/2017   Dry eye syndrome of bilateral lacrimal glands 09/07/2017   Moderate episode of recurrent major depressive disorder (HCC) 07/24/2017   Bilateral calf pain 07/24/2017   Mild cognitive impairment 07/24/2017   Abnormal finding on GI tract imaging  05/25/2017   Lumbar degenerative disc disease 05/13/2017   Common bile duct dilatation 04/27/2017   Left upper arm swelling secondary to rotator cuff tear and fluid tracking down biceps tendon sheath 04/14/2017   Anxiety 03/12/2017   Rotator cuff tendonitis, left 03/10/2017   Myocardial infarct (HCC) 03/09/2017   CAD (coronary artery disease) 03/09/2017   RA (rheumatoid arthritis) (HCC) 07/06/2016   Atherosclerosis of native coronary artery of native heart without angina pectoris 03/16/2016   Chronic back pain 02/27/2016   Acquired hypothyroidism 12/01/2015   COPD (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) 03/05/2015   Hyperlipidemia 01/22/2015   COLD (chronic obstructive lung disease) (HCC) 12/06/2013   Acute pain of left shoulder 09/23/2013   Ganglion cyst of wrist 07/13/2013   Anxiety state 05/16/2013   Trochanteric bursitis of right hip 05/04/2013   Diabetes type 2, controlled (HCC) 03/14/2013   Hypothyroidism 03/14/2013   Sacroiliitis (HCC) 09/14/2011   Chronic headache 04/03/2011   Tobacco use 04/03/2011   Chronic pain 04/03/2011   Benign hypertension 04/03/2011   Allergic rhinitis 10/06/2010   HYPERCALCEMIA 11/13/2009   ARTHRITIS, ACROMIOCLAVICULAR 07/31/2008   SPINAL STENOSIS IN CERVICAL REGION 07/31/2008   Osteoporosis 10/17/2007   Mixed hyperlipidemia 09/28/2007   MDD (major depressive disorder), recurrent episode, mild (HCC) 09/28/2007   FATTY LIVER DISEASE 09/28/2007   Elevated liver function tests 09/28/2007   Chronic pain syndrome 06/05/2007    Conditions to be addressed/monitored: {CCM ASSESSMENT DISEASE OPTIONS:25047}  There are no care plans that you recently modified to display for this patient.   Medication Assistance:  {MEDASSISTANCEINFO:25044}  Follow Up:  {FOLLOWUP:24991}  Plan: {CM FOLLOW UP PLAN:25073}  SIG***

## 2021-03-07 DIAGNOSIS — N19 Unspecified kidney failure: Secondary | ICD-10-CM | POA: Diagnosis not present

## 2021-03-07 DIAGNOSIS — N179 Acute kidney failure, unspecified: Secondary | ICD-10-CM | POA: Diagnosis not present

## 2021-03-07 DIAGNOSIS — E039 Hypothyroidism, unspecified: Secondary | ICD-10-CM | POA: Diagnosis not present

## 2021-03-07 DIAGNOSIS — R4182 Altered mental status, unspecified: Secondary | ICD-10-CM | POA: Diagnosis not present

## 2021-03-07 DIAGNOSIS — E1165 Type 2 diabetes mellitus with hyperglycemia: Secondary | ICD-10-CM | POA: Diagnosis not present

## 2021-03-07 DIAGNOSIS — J9601 Acute respiratory failure with hypoxia: Secondary | ICD-10-CM | POA: Diagnosis not present

## 2021-03-07 DIAGNOSIS — J441 Chronic obstructive pulmonary disease with (acute) exacerbation: Secondary | ICD-10-CM | POA: Diagnosis not present

## 2021-03-07 DIAGNOSIS — N1832 Chronic kidney disease, stage 3b: Secondary | ICD-10-CM | POA: Diagnosis not present

## 2021-03-07 DIAGNOSIS — D72829 Elevated white blood cell count, unspecified: Secondary | ICD-10-CM | POA: Diagnosis not present

## 2021-03-07 DIAGNOSIS — E871 Hypo-osmolality and hyponatremia: Secondary | ICD-10-CM | POA: Diagnosis not present

## 2021-03-09 ENCOUNTER — Encounter: Payer: Self-pay | Admitting: Physician Assistant

## 2021-03-10 ENCOUNTER — Encounter: Payer: Self-pay | Admitting: Family Medicine

## 2021-03-10 ENCOUNTER — Telehealth: Payer: Self-pay | Admitting: Physician Assistant

## 2021-03-10 ENCOUNTER — Telehealth (INDEPENDENT_AMBULATORY_CARE_PROVIDER_SITE_OTHER): Payer: Medicare HMO | Admitting: Family Medicine

## 2021-03-10 DIAGNOSIS — J449 Chronic obstructive pulmonary disease, unspecified: Secondary | ICD-10-CM

## 2021-03-10 DIAGNOSIS — Z09 Encounter for follow-up examination after completed treatment for conditions other than malignant neoplasm: Secondary | ICD-10-CM

## 2021-03-10 NOTE — Telephone Encounter (Signed)
Pt called. She had a virtual visit with you this morning. She wants to know when is she supposed to schedule bp check and she is to get labs done as well?  Thanks

## 2021-03-10 NOTE — Progress Notes (Signed)
Virtual Visit via Telephone Note  I connected with  [name] on 03/10/21 at 10:30 AM EDT by telephone and verified that I am speaking with the correct person using two identifiers.   I discussed the limitations, risks, security and privacy concerns of performing an evaluation and management service by telephone and the availability of in person appointments. I also discussed with the patient that there may be a patient responsible charge related to this service. The patient expressed understanding and agreed to proceed.  Participating parties included in this telephone visit include: The patient and the nurse practitioner listed.  The patient is: At home I am: In the office  Subjective:    CC: hospital follow-up  HPI: [name] is a 70 y.o. year old female presenting today via telephone visit to discuss hospital follow-up.  Patient was hospitalized from October 6 to 8 for altered mental status related to hyponatremia, AKI and COPD exacerbation.  She was on BiPAP for a while but eventually weaned to room air prior to discharge.  CT head negative, UA negative, chest x-ray negative.  She was discharged with 5 days of oral steroids for her COPD.  Fluid replacement to normalize electrolytes and improve kidney function.  She was told to call to schedule a follow-up appointment with [place].  Patient changed appointment to virtual because she was not able to get here today.  States she cannot remember why she was in the hospital or what they found wrong.  She does have some baseline confusion which seemed to worsen during hospital stay, but patient states she is feeling about normal.  States her breathing is at baseline now.  She just feels a little achy and tired.  She denies any GI/GU symptoms, chest pain, trouble breathing.  States she has not yet filled her steroid prescription from the hospital.  States she does not have a way to check her vital signs at home   Past medical history,  Surgical history, Family history not pertinant except as noted below, Social history, Allergies, and medications have been entered into the medical record, reviewed, and corrections made.   Review of Systems:  All review of systems negative except what is listed in the HPI  Objective:    General:  Patient speaking clearly in complete sentences. No shortness of breath noted.   Alert and oriented x3.   Normal judgment.  No apparent acute distress.  Impression and Recommendations:    1. Hospital discharge follow-up Discussed with patient that we would prefer to see her in person so that we can do a more thorough exam including assessment, vital signs, and updating lab work.  States she will try to schedule for later this week.  Recommend she fill the steroid she was given and take it as prescribed.  Encouraged her to stay hydrated and eat a balanced diet.  She is aware of signs and symptoms that would require urgent evaluation.     I discussed the assessment and treatment plan with the patient. The patient was provided an opportunity to ask questions and all were answered. The patient agreed with the plan and demonstrated an understanding of the instructions.   The patient was advised to call back or seek an in-person evaluation if the symptoms worsen or if the condition fails to improve as anticipated.  I provided 20 minutes of non-face-to-face time during this TELEPHONE encounter.    Clayborne Dana, NP

## 2021-03-10 NOTE — Telephone Encounter (Signed)
[facility] called to check on patient after hospitalization, they sent medication for her nebulizer. Patient told them she did not have a nebulizer and asked [place] to order. Okay per Ladona Ridgel. Nebulizer ordered. Faxed to [facility] at [phone].

## 2021-03-12 NOTE — Telephone Encounter (Signed)
I was unable to reach patient by phone to schedule her appointments, i.e. Physical and return in one week for f/u. I personally mailed appointment cards and she is to call us if she is unable to keep those appointments. Thank you.

## 2021-03-18 ENCOUNTER — Ambulatory Visit: Payer: Medicare HMO | Admitting: Physician Assistant

## 2021-03-19 ENCOUNTER — Other Ambulatory Visit: Payer: Self-pay

## 2021-03-19 ENCOUNTER — Ambulatory Visit: Payer: Medicare HMO | Admitting: Pharmacist

## 2021-03-19 ENCOUNTER — Telehealth: Payer: Self-pay | Admitting: Neurology

## 2021-03-19 DIAGNOSIS — I10 Essential (primary) hypertension: Secondary | ICD-10-CM

## 2021-03-19 DIAGNOSIS — J449 Chronic obstructive pulmonary disease, unspecified: Secondary | ICD-10-CM

## 2021-03-19 DIAGNOSIS — E119 Type 2 diabetes mellitus without complications: Secondary | ICD-10-CM

## 2021-03-19 DIAGNOSIS — E782 Mixed hyperlipidemia: Secondary | ICD-10-CM

## 2021-03-19 NOTE — Progress Notes (Signed)
Chronic Care Management Pharmacy Note  03/19/2021 Name:  [name] MRN:  [id number] DOB:  [birth year]  Summary: addressed DM, HTN, HLD, COPD. Patient was not a good historian for what she is taking vs not and stated she plans to discuss with PCP what she needs refills for.  Recommendations/Changes made from today's visit: recommend patient take a more active role in ownership of her health, establish medication routine, and discuss w/ PCP at upcoming visit plans to optimize BP control (not currently taking anything, BP last visit 159/92), and COPD (not currently on any inhalers?).   Plan: f/u with pharmacist in 2 months  Subjective: [name] is an 70 y.o. year old female who is a primary patient of Alden Hipp, Royetta Car, PA-C.  The CCM team was consulted for assistance with disease management and care coordination needs.    Engaged with patient by telephone for initial visit in response to provider referral for pharmacy case management and/or care coordination services.   Consent to Services:  The patient was given information about Chronic Care Management services, agreed to services, and gave verbal consent prior to initiation of services.  Please see initial visit note for detailed documentation.   Patient Care Team: Lavada Mesi as PCP - General (Family Medicine) Park Breed, MD as Referring Physician (Psychiatry) Luretha Rued, RN as Case Manager Darius Bump, [facility] (Pharmacist)   Objective:  Lab Results  Component Value Date   CREATININE 0.83 02/20/2021   CREATININE 0.95 (H) 11/28/2020   CREATININE 0.68 08/19/2020    Lab Results  Component Value Date   HGBA1C 7.3 (H) 02/20/2021   Last diabetic Eye exam:  Lab Results  Component Value Date/Time   HMDIABEYEEXA No Retinopathy 09/02/2017 12:00 AM         Component Value Date/Time   CHOL 186 12/13/2018 1349   TRIG 138 12/13/2018 1349   HDL 83 12/13/2018 1349   CHOLHDL 2.2 12/13/2018 1349    VLDL 25 12/10/2014 1354   LDLCALC 79 12/13/2018 1349   LDLDIRECT 84 01/01/2011 1319    Hepatic Function Latest Ref Rng & Units 02/20/2021 02/20/2021 11/28/2020  Total Protein 6.1 - 8.1 g/dL 7.5 7.5 6.7  Albumin 3.5 - 5.2 g/dL - - -  AST 10 - 35 U/L 11 11 81(H)  ALT 6 - 29 U/L 8 8 59(H)  Alk Phosphatase 39 - 117 U/L - - -  Total Bilirubin 0.2 - 1.2 mg/dL 0.2 0.2 0.2  Bilirubin, Direct 0.0 - 0.2 mg/dL 0.0 - -    Lab Results  Component Value Date/Time   TSH 0.56 02/20/2021 12:00 AM   TSH 0.51 11/28/2020 12:00 AM   FREET4 0.92 03/14/2013 10:55 AM    CBC Latest Ref Rng & Units 02/20/2021 11/28/2020 08/19/2020  WBC 3.8 - 10.8 Thousand/uL 12.8(H) 13.3(H) 8.6  Hemoglobin 11.7 - 15.5 g/dL 14.2 12.7 13.1  Hematocrit 35.0 - 45.0 % 44.8 39.5 39.7  Platelets 140 - 400 Thousand/uL 281 241 280    Lab Results  Component Value Date/Time   VD25OH 60 11/28/2020 12:00 AM   VD25OH 50 07/31/2010 08:23 PM    Social History   Tobacco Use  Smoking Status Every Day   Packs/day: 1.00   Years: 43.00   Pack years: 43.00   Types: Cigarettes  Smokeless Tobacco Never  Tobacco Comments   Has tried Chantix and Wellbutrin in the past   BP Readings from Last 3 Encounters:  02/25/21 (!) 159/92  02/19/21 (!) 149/81  01/07/21 (!) 172/72   Pulse Readings from Last 3 Encounters:  02/25/21 98  02/19/21 93  01/07/21 96   Wt Readings from Last 3 Encounters:  02/25/21 126 lb (57.2 kg)  02/19/21 124 lb (56.2 kg)  01/07/21 124 lb (56.2 kg)    Assessment: Review of patient past medical history, allergies, medications, health status, including review of consultants reports, laboratory and other test data, was performed as part of comprehensive evaluation and provision of chronic care management services.   SDOH:  (Social Determinants of Health) assessments and interventions performed:    CCM Care Plan  Allergies  Allergen Reactions   Codeine Palpitations and Other (See Comments)   Naproxen Rash  and Palpitations    GI upset   Budesonide-Formoterol Fumarate Rash    REACTION: rash REACTION: rash REACTION: rash   Duloxetine Hcl Other (See Comments)    No difference for pain or anti-depressant.  Other reaction(s): Other No difference for pain or anti-depressant. No difference for pain or anti-depressant.   Fentanyl Other (See Comments)    REACTION: severe aggitation, mood changes Other reaction(s): Hallucination, Psychiatric Hallucinations Hallucinations REACTION: severe aggitation, mood changes    Tramadol-Acetaminophen     Other reaction(s): Unknown   Effexor Xr [Venlafaxine Hcl Er]     Felt more anxious or no difference.    Fluticasone-Salmeterol    Lisinopril Rash    Maybe we dont really know    Medications Reviewed Today     Reviewed by Terrilyn Saver, NP (Nurse Practitioner) on 03/10/21 at 1135  Med List Status: <None>   Medication Order Taking? Sig Documenting Provider Last Dose Status Informant  alendronate (FOSAMAX) 70 MG tablet [id number] No TAKE ONE TABLET EVERY 7 DAYS WITH A FULL GLASS OF WATER ON AN EMPTY STOMACH Breeback, Jade L, PA-C Taking Active   ALPRAZolam (XANAX) 0.25 MG tablet [id number] No Take 1 tablet (0.25 mg total) by mouth 2 (two) times daily as needed for anxiety. Donella Stade, PA-C Taking Active            Med Note Juleen China, Deno Etienne   Wed Mar 04, 2021  1:20 PM) Currently taking once a day, but states she will be out in a couple of days.  atorvastatin (LIPITOR) 80 MG tablet [id number] No Take 1 tablet (80 mg total) by mouth daily. Donella Stade, PA-C Taking Active   buPROPion (WELLBUTRIN XL) 150 MG 24 hr tablet [id number] No TAKE 1 TABLET BY MOUTH EVERY DAY IN THE MORNING Breeback, Jade L, PA-C Taking Active   busPIRone (BUSPAR) 5 MG tablet [id number] No Take 1 tablet (5 mg total) by mouth 3 (three) times daily. Donella Stade, PA-C Taking Active   desvenlafaxine (PRISTIQ) 100 MG 24 hr tablet [id number] No Take 1 tablet (100 mg total) by  mouth daily. Donella Stade, PA-C Taking Active   Diclofenac Sodium 1 % CREA [id number] No Apply 1 application topically in the morning and at bedtime. Donella Stade, PA-C Taking Active   gabapentin (NEURONTIN) 600 MG tablet [id number] No TAKE 1 TABLET BY MOUTH THREE TIMES A DAY Breeback, Jade L, PA-C Taking Active   hydroxychloroquine (PLAQUENIL) 200 MG tablet [id number] No Take one tablet by mouth daily. Donella Stade, PA-C Taking Active   levothyroxine (SYNTHROID) 150 MCG tablet [id number] No 1 TAB (150 MCG) BY MOUTH EVERY SUNDAY, MONDAY, WEDNESDAY, Friday, SATURDAY. 2 TABS (300 MCG) BY MOUTH EVERY TUESDAY, THURSDAY. Terrilyn Saver, NP Taking Active  metFORMIN (GLUCOPHAGE) 1000 MG tablet [id number] No Take 1 tablet (1,000 mg total) by mouth 2 (two) times daily with a meal. Breeback, Jade L, PA-C Taking Active   predniSONE (DELTASONE) 5 MG tablet [id number] No TAKE 1 TABLET BY MOUTH EVERY DAY WITH BREAKFAST Breeback, Jade L, PA-C Taking Active   traZODone (DESYREL) 50 MG tablet [id number] No Take 0.5-2 tablets (25-100 mg total) by mouth at bedtime as needed for sleep. Donella Stade, PA-C Taking Active             Patient Active Problem List   Diagnosis Date Noted   Elevated blood pressure reading in office with diagnosis of hypertension 01/13/2021   Intertrigo 01/05/2021   Medication non-compliance due to excessive pill burden 11/28/2020   Confusion 11/25/2020   Declined smoking cessation 08/05/2020   Polypharmacy 08/04/2020   Chronic upper neck pain 12/18/2019   Left lumbar radiculopathy 11/05/2019   Arthralgia of lower leg 10/22/2019   Bilateral hand swelling 07/16/2019   Bilateral hand pain 07/16/2019   SIRS (systemic inflammatory response syndrome) (HCC) 06/04/2019   Sepsis ([place]) 06/04/2019   Excessive sleepiness 05/15/2019   No appetite 05/15/2019   Fatigue 12/11/2018   Fixed pupil of both eyes 07/18/2018   Frequent headaches 07/18/2018   Unsteady gait 07/18/2018    Primary osteoarthritis 07/14/2018   Gastroesophageal reflux disease with esophagitis 07/14/2018   Vitamin D deficiency 04/05/2018   Primary insomnia 04/05/2018   Hiatal hernia 04/05/2018   Vasovagal syncope 12/16/2017   History of diverticulitis 11/21/2017   Non-intractable vomiting with nausea 11/21/2017   Left lower quadrant guarding 11/21/2017   Generalized abdominal pain 11/21/2017   Positive colorectal cancer screening using Cologuard test 10/07/2017   Microalbuminuria 10/07/2017   Weakness of both lower extremities 10/07/2017   Bilateral cataracts 09/07/2017   Dry eye syndrome of bilateral lacrimal glands 09/07/2017   Moderate episode of recurrent major depressive disorder ([place]) 07/24/2017   Bilateral calf pain 07/24/2017   Mild cognitive impairment 07/24/2017   Abnormal finding on GI tract imaging 05/25/2017   Lumbar degenerative disc disease 05/13/2017   Common bile duct dilatation 04/27/2017   Left upper arm swelling secondary to rotator cuff tear and fluid tracking down biceps tendon sheath 04/14/2017   Anxiety 03/12/2017   Rotator cuff tendonitis, left 03/10/2017   Myocardial infarct ([place]) 03/09/2017   CAD (coronary artery disease) 03/09/2017   RA (rheumatoid arthritis) ([place]) 07/06/2016   Atherosclerosis of native coronary artery of native heart without angina pectoris 03/16/2016   Chronic back pain 02/27/2016   Acquired hypothyroidism 12/01/2015   COPD (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 ([place]) 03/05/2015   Hyperlipidemia 01/22/2015   COLD (chronic obstructive lung disease) ([place]) 12/06/2013   Acute pain of left shoulder 09/23/2013   Ganglion cyst of wrist 07/13/2013   Anxiety state 05/16/2013   Trochanteric bursitis of right hip 05/04/2013   Diabetes type 2, controlled ([place]) 03/14/2013   Hypothyroidism 03/14/2013   Sacroiliitis ([place]) 09/14/2011   Chronic headache 04/03/2011   Tobacco use 04/03/2011   Chronic pain 04/03/2011   Benign  hypertension 04/03/2011   Allergic rhinitis 10/06/2010   HYPERCALCEMIA 11/13/2009   ARTHRITIS, ACROMIOCLAVICULAR 07/31/2008   SPINAL STENOSIS IN CERVICAL REGION 07/31/2008   Osteoporosis 10/17/2007   Mixed hyperlipidemia 09/28/2007   MDD (major depressive disorder), recurrent episode, mild ([place]) 09/28/2007   FATTY LIVER DISEASE 09/28/2007   Elevated liver function tests 09/28/2007   Chronic pain syndrome 06/05/2007    Immunization History  Administered Date(s)  Administered   H1N1 07/03/2008   Influenza Split 03/05/2011, 06/14/2012   Influenza Whole 03/14/2008   Influenza, High Dose Seasonal PF 05/28/2016, 03/08/2018   Influenza,inj,Quad PF,6+ Mos 03/14/2013, 04/02/2014, 06/07/2017, 02/12/2019   Influenza-Unspecified 01/30/2020   Janssen (J&J) SARS-COV-2 Vaccination 09/08/2019   MMR 10/26/2017   PFIZER(Purple Top)SARS-COV-2 Vaccination 03/29/2020, 11/14/2020   Pneumococcal Conjugate-13 09/21/2013, 07/19/2017   Pneumococcal Polysaccharide-23 05/31/2001, 02/12/2019   Td 10/17/2007   Zoster Recombinat (Shingrix) 03/25/2020   Zoster, Live 12/08/2010    Conditions to be addressed/monitored: HTN, HLD, COPD, and DMII  There are no care plans that you recently modified to display for this patient.   Medication Assistance: None required.  Patient affirms current coverage meets needs.  Patient's preferred pharmacy is:  [id number] [place] [id number]: [phone]: [phone] Uses pill box? Yes Pt endorses 80% compliance  Follow Up:  Patient agrees to Care Plan and Follow-up.  Plan: Telephone follow up appointment with care management team member scheduled for:  2 months

## 2021-03-19 NOTE — Telephone Encounter (Signed)
Patient called and left vm asking for Xanax, wants #10, states her husband is "dying of cancer". Deklen Popelka just sent #60 with 1 refill on 02/25/2021. Tried to call patient, did not answer, no vm available.

## 2021-03-19 NOTE — Patient Instructions (Signed)
Visit Information   PATIENT GOALS:   Goals Addressed             This Visit's Progress    Medication Management       Patient Goals/Self-Care Activities Over the next 60 days, patient will:  focus on medication adherence by using pill box, engaging in discussion of medications with PCP and what she may need refilled, and continue to work on establishing routine  Follow Up Plan: Telephone follow up appointment with care management team member scheduled for:  2 months         Consent to CCM Services: [name] was given information about Chronic Care Management services including:  CCM service includes personalized support from designated clinical staff supervised by her physician, including individualized plan of care and coordination with other care providers 24/7 contact phone numbers for assistance for urgent and routine care needs. Service will only be billed when office clinical staff spend 20 minutes or more in a month to coordinate care. Only one practitioner may furnish and bill the service in a calendar month. The patient may stop CCM services at any time (effective at the end of the month) by phone call to the office staff. The patient will be responsible for cost sharing (co-pay) of up to 20% of the service fee (after annual deductible is met).  Patient agreed to services and verbal consent obtained.   Patient verbalizes understanding of instructions provided today and agrees to view in [place].   Telephone follow up appointment with care management team member scheduled for:  2 months  Darius Bump   CLINICAL CARE PLAN: Patient Care Plan: Diabetes Type 2 (Adult)     Problem Identified: Developement of long term care plan for management of diabetes in a patient with cardiovascular disease   Priority: Medium     Long-Range Goal: Disease Progression Prevented or Minimized   Start Date: 11/26/2020  Expected End Date: 05/08/2021  Priority: Medium  Note:    Objective:  Lab Results  Component Value Date   HGBA1C 7.3 (H) 02/20/2021   Lab Results  Component Value Date   CREATININE 0.83 02/20/2021   CREATININE 0.95 (H) 11/28/2020   CREATININE 0.68 08/19/2020  Objective:  Last practice recorded BP readings:  BP Readings from Last 3 Encounters:  02/25/21 (!) 159/92  02/19/21 (!) 149/81  01/07/21 (!) 172/72   Most recent eGFR/CrCl:  Lab Results  Component Value Date   EGFR 75 02/20/2021  Current Barriers:  Knowledge Deficits related to long term care plan for management of diabetes in a patient with HTN, CAD, history of MI, mild cognitive impairment. 70 yr old lives with husband. Patient denies dizziness at this time. She reports she has not had a fall in the past month. She denies any issues with confusion at this time. A1C 7.3 increased from 7.0 on 08/01/20. Patient reports she does not feel she needs Desvenlafaxine because she is not depressed, but will discuss with primary care provider. RNCM encouraged patient to speak with provider and reinforced that the medication is probably helping to relieve signs/symptoms. Patient reports concern about discontinuing Alprazolam. RNCM offered referral to LCSW regarding anxiety. Patient declined.   Does not have glucometer  Does not adhere to prescribed medication regimen Case Manager Clinical Goal(s):  patient will demonstrate improved adherence to prescribed treatment plan for diabetes self care/management as evidenced by: increased monitoring of blood sugars per provider recommendation and adherence to prescribed medication regimen Interventions:  Collaboration with Alden Hipp,  Royetta Car, PA-C regarding development and update of comprehensive plan of care as evidenced by provider attestation and co-signature Inter-disciplinary care team collaboration (see longitudinal plan of care) Reviewed medications with patient. Reinforced clinical pharmacist will call to review/discuss medications.  Coordination with  provider regarding glucose meter.  Reviewed scheduled/upcoming provider appointments: per after visit summary, patient to be seen around 04/27/21. RNCM informed patient. [name] states she will call to schedule. Discussed plans with patient for ongoing care management follow up and provided patient with direct contact information for care management team Patient Goals: - attend provider visits as scheduled - obtain glucose meter and begin to check your blood sugar per provider recommendation - check blood sugar per provider recommendation and as needed, if I feel it is too high or too low - continue fall prevention strategies: change position slowly; keep walkways clear, have good lighting in the room, keep provider appointments, take medications as prescribed, maintain good muscle strength/tone. - call your provider if your condition does not improve or worsens and with questions or concerns - communicate with clinical pharmacist regarding medications.  Follow Up Plan: Telephone follow up appointment with care management team member scheduled for: next month The patient has been provided with contact information for the care management team and has been advised to call with any health related questions or concerns.      Patient Care Plan: Medication Management     Problem Identified: DM, HTN, HLD, COPD      Long-Range Goal: Disease Progression Prevention   Start Date: 03/19/2021  This Visit's Progress: On track  Priority: High  Note:   Current Barriers:  Unable to self administer medications as prescribed Does not adhere to prescribed medication regimen  Pharmacist Clinical Goal(s):  Over the next 60 days, patient will adhere to prescribed medication regimen as evidenced by medication fill history, lab values, and vital signs through collaboration with PharmD and provider.   Interventions: 1:1 collaboration with Donella Stade, PA-C regarding development and update of  comprehensive plan of care as evidenced by provider attestation and co-signature Inter-disciplinary care team collaboration (see longitudinal plan of care) Comprehensive medication review performed; medication list updated in electronic medical record  Diabetes:  Uncontrolled current treatment:metformin 1g BID; a1c 7.5  Current glucose readings: not currently checking  Denies hypoglycemic/hyperglycemic symptoms  Current meal patterns: to be discussed at future visits  Current exercise: to be discussed at future visits  Counseled on importance of medication adherence Recommended continue current regimen,   Hypertension:  Uncontrolled; current treatment:appears not currently taking medications for hypertension;   Current home readings: not checking  Denies hypotensive/hypertensive symptoms  Recommended discuss at future visit with PCP,   Hyperlipidemia:  Controlled; current treatment:atorvastatin 86m daily; LDL 79   Recommended continue current regimen  Chronic Obstructive Pulmonary Disease:  Uncontrolled; current treatment:uncertain inhaler usage;   1 exacerbations requiring treatment in the last 6 months   Recommended discuss at future visit with PCP  Patient Goals/Self-Care Activities Over the next 60 days, patient will:  focus on medication adherence by using pill box, engaging in discussion of medications with PCP and what she may need refilled, and continue to work on establishing routine  Follow Up Plan: Telephone follow up appointment with care management team member scheduled for:  2 months

## 2021-03-20 ENCOUNTER — Ambulatory Visit: Payer: Medicare HMO | Admitting: Physician Assistant

## 2021-03-24 ENCOUNTER — Encounter: Payer: Self-pay | Admitting: Physician Assistant

## 2021-03-24 ENCOUNTER — Other Ambulatory Visit: Payer: Self-pay

## 2021-03-24 ENCOUNTER — Ambulatory Visit (INDEPENDENT_AMBULATORY_CARE_PROVIDER_SITE_OTHER): Payer: Medicare HMO | Admitting: Physician Assistant

## 2021-03-24 VITALS — BP 124/72 | HR 111 | Ht 63.0 in | Wt 127.0 lb

## 2021-03-24 DIAGNOSIS — F331 Major depressive disorder, recurrent, moderate: Secondary | ICD-10-CM | POA: Diagnosis not present

## 2021-03-24 DIAGNOSIS — Z23 Encounter for immunization: Secondary | ICD-10-CM

## 2021-03-24 DIAGNOSIS — F419 Anxiety disorder, unspecified: Secondary | ICD-10-CM | POA: Diagnosis not present

## 2021-03-24 DIAGNOSIS — D649 Anemia, unspecified: Secondary | ICD-10-CM | POA: Diagnosis not present

## 2021-03-24 DIAGNOSIS — J449 Chronic obstructive pulmonary disease, unspecified: Secondary | ICD-10-CM | POA: Diagnosis not present

## 2021-03-24 DIAGNOSIS — M0579 Rheumatoid arthritis with rheumatoid factor of multiple sites without organ or systems involvement: Secondary | ICD-10-CM

## 2021-03-24 DIAGNOSIS — E119 Type 2 diabetes mellitus without complications: Secondary | ICD-10-CM | POA: Diagnosis not present

## 2021-03-24 DIAGNOSIS — E871 Hypo-osmolality and hyponatremia: Secondary | ICD-10-CM | POA: Diagnosis not present

## 2021-03-24 DIAGNOSIS — R69 Illness, unspecified: Secondary | ICD-10-CM | POA: Diagnosis not present

## 2021-03-24 DIAGNOSIS — F330 Major depressive disorder, recurrent, mild: Secondary | ICD-10-CM

## 2021-03-24 LAB — POCT UA - MICROALBUMIN
Albumin/Creatinine Ratio, Urine, POC: <30
Creatinine, POC: 50 mg/dL
Microalbumin Ur, POC: 10 mg/L

## 2021-03-24 MED ORDER — BLOOD GLUCOSE MONITOR KIT: PACK | 0 refills | Status: AC

## 2021-03-24 MED ORDER — DESVENLAFAXINE SUCCINATE ER 100 MG PO TB24: 100.0000 mg | ORAL_TABLET | Freq: Every day | ORAL | 1 refills | Status: AC

## 2021-03-24 MED ORDER — BUPROPION HCL ER (XL) 150 MG PO TB24: ORAL_TABLET | ORAL | 1 refills | Status: AC

## 2021-03-24 MED ORDER — METHYLPREDNISOLONE ACETATE 80 MG/ML IJ SUSP
80.0000 mg | Freq: Once | INTRAMUSCULAR | Status: AC
  Administered 2021-03-24: 80 mg via INTRAMUSCULAR

## 2021-03-24 MED ORDER — ALBUTEROL SULFATE HFA 108 (90 BASE) MCG/ACT IN AERS: 2.0000 | INHALATION_SPRAY | Freq: Four times a day (QID) | RESPIRATORY_TRACT | 1 refills | Status: AC | PRN

## 2021-03-24 MED ORDER — TRELEGY ELLIPTA 100-62.5-25 MCG/ACT IN AEPB: 1.0000 | INHALATION_SPRAY | Freq: Every day | RESPIRATORY_TRACT | 11 refills | Status: AC

## 2021-03-24 MED ORDER — HYDROXYCHLOROQUINE SULFATE 200 MG PO TABS: ORAL_TABLET | ORAL | 1 refills | Status: AC

## 2021-03-24 MED ORDER — PREDNISONE 5 MG PO TABS: ORAL_TABLET | ORAL | 1 refills | Status: DC

## 2021-03-24 NOTE — Progress Notes (Signed)
Subjective:    Patient ID: [name], female    DOB: [birth year], 70 y.o.   MRN: [id number]  HPI Pt is a 70 yo female with T2DM, CAD, COPD, hypothyroidism, RA, chronic pain, Anxiety who presents to the clinic for follow up after ED visit on 10/6 for hyponatremia. She had a virtual visit with [name] on 10/11 but requested for her to come in for office visit.   [name] note:   Patient was hospitalized from October 6 to 8 for altered mental status related to hyponatremia, AKI and COPD exacerbation.  She was on BiPAP for a while but eventually weaned to room air prior to discharge.  CT head negative, UA negative, chest x-ray negative.  She was discharged with 5 days of oral steroids for her COPD.  Fluid replacement to normalize electrolytes and improve kidney function.    Pt does feel better. She still is not sure or what she is or isn't taking. She is not taking advair. She reports "I don't have problems breathing".   Her sodium was low and has not been rechecked.   Her a1c was 7.5. she is not checking her sugars at home.   She continues to have anxiety and asks about xanax being refilled. I have been trying to taper her off.   .. Active Ambulatory Problems    Diagnosis Date Noted   Mixed hyperlipidemia 09/28/2007   HYPERCALCEMIA 11/13/2009   MDD (major depressive disorder), recurrent episode, mild (HCC) 09/28/2007   Chronic pain syndrome 06/05/2007   FATTY LIVER DISEASE 09/28/2007   ARTHRITIS, ACROMIOCLAVICULAR 07/31/2008   SPINAL STENOSIS IN CERVICAL REGION 07/31/2008   Osteoporosis 10/17/2007   Elevated liver function tests 09/28/2007   Allergic rhinitis 10/06/2010   Chronic headache 04/03/2011   Tobacco use 04/03/2011   Chronic pain 04/03/2011   Benign hypertension 04/03/2011   Sacroiliitis ([place]) 09/14/2011   Diabetes type 2, controlled ([place]) 03/14/2013   Hypothyroidism 03/14/2013   Trochanteric bursitis of right hip 05/04/2013   Anxiety state 05/16/2013   Ganglion  cyst of wrist 07/13/2013   Acute pain of left shoulder 09/23/2013   COLD (chronic obstructive lung disease) ([place]) 12/06/2013   Hyperlipidemia 01/22/2015   COPD (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 ([place]) 03/05/2015   Myocardial infarct ([place]) 03/09/2017   CAD (coronary artery disease) 03/09/2017   Rotator cuff tendonitis, left 03/10/2017   Anxiety 03/12/2017   Left upper arm swelling secondary to rotator cuff tear and fluid tracking down biceps tendon sheath 04/14/2017   Common bile duct dilatation 04/27/2017   Lumbar degenerative disc disease 05/13/2017   Abnormal finding on GI tract imaging 05/25/2017   Chronic back pain 02/27/2016   Atherosclerosis of native coronary artery of native heart without angina pectoris 03/16/2016   RA (rheumatoid arthritis) ([place]) 07/06/2016   Acquired hypothyroidism 12/01/2015   Moderate episode of recurrent major depressive disorder ([place]) 07/24/2017   Bilateral calf pain 07/24/2017   Mild cognitive impairment 07/24/2017   Bilateral cataracts 09/07/2017   Dry eye syndrome of bilateral lacrimal glands 09/07/2017   Positive colorectal cancer screening using Cologuard test 10/07/2017   Microalbuminuria 10/07/2017   Weakness of both lower extremities 10/07/2017   History of diverticulitis 11/21/2017   Non-intractable vomiting with nausea 11/21/2017   Left lower quadrant guarding 11/21/2017   Generalized abdominal pain 11/21/2017   Vasovagal syncope 12/16/2017   Vitamin D deficiency 04/05/2018   Primary insomnia 04/05/2018   Hiatal hernia 04/05/2018   Primary osteoarthritis 07/14/2018  Gastroesophageal reflux disease with esophagitis 07/14/2018   Fixed pupil of both eyes 07/18/2018   Frequent headaches 07/18/2018   Unsteady gait 07/18/2018   Fatigue 12/11/2018   Excessive sleepiness 05/15/2019   No appetite 05/15/2019   SIRS (systemic inflammatory response syndrome) ([place]) 06/04/2019   Sepsis ([place]) 06/04/2019   Bilateral  hand swelling 07/16/2019   Bilateral hand pain 07/16/2019   Arthralgia of lower leg 10/22/2019   Left lumbar radiculopathy 11/05/2019   Chronic upper neck pain 12/18/2019   Polypharmacy 08/04/2020   Declined smoking cessation 08/05/2020   Confusion 11/25/2020   Medication non-compliance due to excessive pill burden 11/28/2020   Intertrigo 01/05/2021   Elevated blood pressure reading in office with diagnosis of hypertension 01/13/2021   Leukocytosis 03/06/2021   Acute respiratory failure with hypoxia and hypercapnia ([place]) 03/06/2021   Hyponatremia 03/26/2021   Resolved Ambulatory Problems    Diagnosis Date Noted   DM (diabetes mellitus) ([place]) 06/05/2007   HEMORRHOIDS, WITH BLEEDING 04/22/2008   Acute maxillary sinusitis 40/02/2724   Alcoholic fatty liver 36/64/4034   RENAL INSUFFICIENCY, ACUTE 10/23/2008   ROTATOR CUFF SYNDROME, RIGHT 04/01/2008   ALLERGIC REACTION 12/24/2009   ELEVATED BLOOD PRESSURE WITHOUT DIAGNOSIS OF HYPERTENSION 05/13/2010   CHRONIC OBSTRUCTIVE PULMONARY DISEASE, ACUTE EXACERBATION 08/14/2010   Recurrent acute sinusitis 09/09/2010   Allergic dermatitis due to poison ivy 11/27/2010   DERMATITIS, ATOPIC 01/15/2011   Arthritis of carpometacarpal joint 02/14/2012   Ganglion cyst of left foot 07/13/2013   Chronic pain syndrome 12/05/2013   Primary osteoarthritis of left wrist 12/10/2014   Rheumatoid arthritis involving both hands ([place]) 03/09/2017   Bulging lumbar disc 02/27/2016   Pyelonephritis 11/21/2017   Upper abdominal pain 03/10/2018   Patellar sleeve fracture, right, closed, initial encounter 04/24/2018   Past Medical History:  Diagnosis Date   Cervicalgia    COPD (chronic obstructive pulmonary disease) (HCC)    DDD (degenerative disc disease), lumbar    Diabetes mellitus    Fatty liver    Narcotic abuse (HCC)    Osteoarthritis    Osteoporosis    Rheumatoid arthritis ([place])    Thyroid disease       Review of Systems   See HPI.   Objective:   Physical Exam Vitals reviewed.  Constitutional:      Appearance: Normal appearance.  HENT:     Head: Normocephalic.  Cardiovascular:     Rate and Rhythm: Normal rate and regular rhythm.  Pulmonary:     Effort: Pulmonary effort is normal.     Breath sounds: Rhonchi and rales present.  Musculoskeletal:     Cervical back: Neck supple.     Right lower leg: No edema.     Left lower leg: No edema.  Neurological:     General: No focal deficit present.     Mental Status: She is alert.     Comments: Confused at time.   Psychiatric:        Mood and Affect: Mood normal.          Assessment & Plan:  Marland KitchenMarland KitchenDiane was seen today for hospitalization follow-up.  Diagnoses and all orders for this visit:  Hyponatremia -     COMPLETE METABOLIC PANEL WITH GFR -     Fe+TIBC+Fer -     POCT UA - Microalbumin  Rheumatoid arthritis involving multiple sites with positive rheumatoid factor (HCC) -     hydroxychloroquine (PLAQUENIL) 200 MG tablet; Take one tablet by mouth daily. -     POCT UA -  Microalbumin -     methylPREDNISolone acetate (DEPO-MEDROL) injection 80 mg  Moderate episode of recurrent major depressive disorder (HCC) -     desvenlafaxine (PRISTIQ) 100 MG 24 hr tablet; Take 1 tablet (100 mg total) by mouth daily.  Anxiety -     desvenlafaxine (PRISTIQ) 100 MG 24 hr tablet; Take 1 tablet (100 mg total) by mouth daily.  MDD (major depressive disorder), recurrent episode, mild (HCC) -     buPROPion (WELLBUTRIN XL) 150 MG 24 hr tablet; TAKE 1 TABLET BY MOUTH EVERY DAY IN THE MORNING  Need for influenza vaccination -     Flu Vaccine QUAD High Dose(Fluad)  Chronic obstructive pulmonary disease, unspecified COPD type ([place]) -     Fluticasone-Umeclidin-Vilant (TRELEGY ELLIPTA) 100-62.5-25 MCG/ACT AEPB; Inhale 1 puff into the lungs daily. -     albuterol (VENTOLIN HFA) 108 (90 Base) MCG/ACT inhaler; Inhale 2 puffs into the lungs every 6 (six) hours as needed.  Controlled  type 2 diabetes mellitus without complication, without long-term current use of insulin (HCC) -     blood glucose meter kit and supplies KIT; Dispense based on patient and insurance preference. Use up to four times daily as directed. Please include lancets, test strips, control solution.  Other orders -     Discontinue: predniSONE (DELTASONE) 5 MG tablet; TAKE 1 TABLET BY MOUTH EVERY DAY WITH BREAKFAST  Will check CMP to make sure sodium is normal.   Discussed COPD. Pulse ox today is 93 percent which is low. Switched advair to trelegy so it can be once a day. Strongly recommended patient to start this.   Her RA is flaring but likely because not on medication. Please start medication.   Her A1C is elevated but likely due to prednisone and not taking medication right.   Her anxiety is not controlled. She has refills of xanax but at a very low dose. Not taking pristiq, discussed again why I cannot just give her 73m tablets again. There is some element of confusion but her MMSE have been good. Some of her confusion I think is anxiety related.

## 2021-03-24 NOTE — Patient Instructions (Signed)
Please start trelegy one puff once a day in the morning.  Get labs today.  Follow up in 1 month.

## 2021-03-25 LAB — IRON,TIBC AND FERRITIN PANEL
%SAT: 10 % (calc) — ABNORMAL LOW (ref 16–45)
Ferritin: 74 ng/mL (ref 16–288)
Iron: 36 ug/dL — ABNORMAL LOW (ref 45–160)
TIBC: 353 mcg/dL (calc) (ref 250–450)

## 2021-03-25 LAB — COMPLETE METABOLIC PANEL WITH GFR
AG Ratio: 1.4 (calc) (ref 1.0–2.5)
ALT: 15 U/L (ref 6–29)
AST: 15 U/L (ref 10–35)
Albumin: 4.1 g/dL (ref 3.6–5.1)
Alkaline phosphatase (APISO): 81 U/L (ref 37–153)
BUN: 16 mg/dL (ref 7–25)
CO2: 23 mmol/L (ref 20–32)
Calcium: 10.1 mg/dL (ref 8.6–10.4)
Chloride: 101 mmol/L (ref 98–110)
Creat: 0.77 mg/dL (ref 0.60–1.00)
Globulin: 3 g/dL (calc) (ref 1.9–3.7)
Glucose, Bld: 169 mg/dL — ABNORMAL HIGH (ref 65–99)
Potassium: 4 mmol/L (ref 3.5–5.3)
Sodium: 138 mmol/L (ref 135–146)
Total Bilirubin: 0.2 mg/dL (ref 0.2–1.2)
Total Protein: 7.1 g/dL (ref 6.1–8.1)
eGFR: 82 mL/min/{1.73_m2} (ref >=60)

## 2021-03-25 NOTE — Progress Notes (Signed)
Sodium is normal.  Your iron is low. Make sure you are taking an iron supplement daily.

## 2021-03-26 ENCOUNTER — Encounter: Payer: Self-pay | Admitting: Physician Assistant

## 2021-03-26 DIAGNOSIS — E871 Hypo-osmolality and hyponatremia: Secondary | ICD-10-CM | POA: Insufficient documentation

## 2021-03-30 DIAGNOSIS — E119 Type 2 diabetes mellitus without complications: Secondary | ICD-10-CM | POA: Diagnosis not present

## 2021-03-30 DIAGNOSIS — I10 Essential (primary) hypertension: Secondary | ICD-10-CM | POA: Diagnosis not present

## 2021-03-30 DIAGNOSIS — J449 Chronic obstructive pulmonary disease, unspecified: Secondary | ICD-10-CM

## 2021-03-30 DIAGNOSIS — E782 Mixed hyperlipidemia: Secondary | ICD-10-CM | POA: Diagnosis not present

## 2021-04-01 ENCOUNTER — Ambulatory Visit: Payer: Medicare HMO

## 2021-04-01 DIAGNOSIS — J449 Chronic obstructive pulmonary disease, unspecified: Secondary | ICD-10-CM

## 2021-04-01 DIAGNOSIS — E119 Type 2 diabetes mellitus without complications: Secondary | ICD-10-CM

## 2021-04-01 NOTE — Chronic Care Management (AMB) (Signed)
   04/01/2021  JILLAYNE WITTE 12/26/1950 [id number]    Care Management   Follow Up Note   04/01/2021 Name: [name] MRN: [id number] DOB: [birth year]   Referred by: [name], PA-C Reason for referral : Chronic Care Management (RNCM follow up)   Successful contact was made with patient to follow up with care management and care coordination services. Patient reports this is not a good time. She states she has a virus and does not feel like she needs to follow up with primary care provider at this time. She declines to reschedule appointment and  preferrs care guide call at another time to reschedule.   Follow Up Plan: RNCM made request to care guide to reschedule patient.     Kathyrn Sheriff, RN, MSN, BSN, CCM Care Management Coordinator [name] [phone]

## 2021-04-06 ENCOUNTER — Telehealth: Payer: Self-pay | Admitting: *Deleted

## 2021-04-06 NOTE — Chronic Care Management (AMB) (Signed)
  Care Management   Note  04/06/2021 Name: MARCE SCHARTZ MRN: [id number] DOB: September 18, 1950  Marvetta Gibbons is a 70 y.o. year old female who is a primary care patient of Nolene Ebbs and is actively engaged with the care management team. I reached out to Marvetta Gibbons by phone today to assist with re-scheduling a follow up visit with the RN Case Manager  Follow up plan: Unsuccessful telephone outreach attempt made. A HIPAA compliant phone message was left for the patient providing contact information and requesting a return call.   Burman Nieves, CCMA Care Guide, Embedded Care Coordination [facility]  Care Management  Direct Dial: [phone]

## 2021-04-16 NOTE — Chronic Care Management (AMB) (Signed)
  Care Management   Note  04/16/2021 Name: LUCRESIA SIMIC MRN: [id number] DOB: [birth year]  Marvetta Gibbons is a 70 y.o. year old female who is a primary care patient of Nolene Ebbs and is actively engaged with the care management team. I reached out to Marvetta Gibbons by phone today to assist with re-scheduling a follow up visit with the RN Case Manager  Follow up plan: 2nd Unsuccessful telephone outreach attempt made. A HIPAA compliant phone message was left for the patient providing contact information and requesting a return call.   Burman Nieves, CCMA Care Guide, Embedded Care Coordination [facility]  Care Management  Direct Dial: [phone]

## 2021-04-21 ENCOUNTER — Telehealth: Payer: Self-pay

## 2021-04-21 NOTE — Telephone Encounter (Signed)
LVMTRC (1st attempt)    Pt called and LVM stating that she is experiencing an exacerbation of arthritis pain. She is wanting to know if something can be sent to the pharmacy to help because what she has at home is not working. She is requesting Tranzine (?)

## 2021-04-27 NOTE — Chronic Care Management (AMB) (Signed)
  Care Management   Note  04/27/2021 Name: [name] MRN: [id number] DOB: 29-Jun-1950  [name] is a 70 y.o. year old female who is a primary care patient of Nolene Ebbs and is actively engaged with the care management team. I reached out to [name] by phone today to assist with re-scheduling a follow up visit with the RN Case Manager  Follow up plan: Patient declines further follow up and engagement by the care management team. Appropriate care team members and provider have been notified via electronic communication.   Burman Nieves, CCMA Care Guide, Embedded Care Coordination [facility]  Care Management  Direct Dial: [phone]

## 2021-04-28 ENCOUNTER — Other Ambulatory Visit: Payer: Self-pay | Admitting: Physician Assistant

## 2021-04-28 DIAGNOSIS — F5101 Primary insomnia: Secondary | ICD-10-CM

## 2021-04-29 ENCOUNTER — Ambulatory Visit (INDEPENDENT_AMBULATORY_CARE_PROVIDER_SITE_OTHER): Payer: Medicare HMO

## 2021-04-29 DIAGNOSIS — J449 Chronic obstructive pulmonary disease, unspecified: Secondary | ICD-10-CM

## 2021-04-29 DIAGNOSIS — E119 Type 2 diabetes mellitus without complications: Secondary | ICD-10-CM

## 2021-04-29 DIAGNOSIS — I10 Essential (primary) hypertension: Secondary | ICD-10-CM

## 2021-04-29 NOTE — Chronic Care Management (AMB) (Signed)
Chronic Care Management   CCM RN Visit Note  04/29/2021 Name: [name] MRN: [id number] DOB: 04/16/1951  Subjective: [name] is a 70 y.o. year old female who is a primary care patient of Donella Stade, PA-C. The care management team was consulted for assistance with disease management and care coordination needs.    RNCM received Message from care guide "Pt appreciative of call but declines to schedule f/u at this time says no longer needed". RNCM will close out of care and update clinical pharmacist.  Consent to Services:  The patient was given information about Chronic Care Management services, agreed to services, and gave verbal consent prior to initiation of services.  Please see initial visit note for detailed documentation.   Patient agreed to services and verbal consent obtained.   Assessment: Review of patient past medical history, allergies, medications, health status, including review of consultants reports, laboratory and other test data, was performed as part of comprehensive evaluation and provision of chronic care management services.   SDOH (Social Determinants of Health) assessments and interventions performed:    CCM Care Plan  Allergies  Allergen Reactions   Codeine Palpitations   Naproxen Rash and Palpitations    GI upset   Budesonide-Formoterol Fumarate Rash   Duloxetine Hcl Other (See Comments)    No difference for pain or anti-depressant.    Fentanyl Other (See Comments)    REACTION: severe aggitation, mood changes, Hallucination, Psychiatric     Tramadol-Acetaminophen     Other reaction(s): Unknown   Effexor Xr [Venlafaxine Hcl Er]     Felt more anxious or no difference.    Lisinopril Rash    Maybe we dont really know    Outpatient Encounter Medications as of 04/29/2021  Medication Sig   albuterol (VENTOLIN HFA) 108 (90 Base) MCG/ACT inhaler Inhale 2 puffs into the lungs every 6 (six) hours as needed.   alendronate (FOSAMAX) 70 MG  tablet TAKE ONE TABLET EVERY 7 DAYS WITH A FULL GLASS OF WATER ON AN EMPTY STOMACH   atorvastatin (LIPITOR) 80 MG tablet Take 1 tablet (80 mg total) by mouth daily.   blood glucose meter kit and supplies KIT Dispense based on patient and insurance preference. Use up to four times daily as directed. Please include lancets, test strips, control solution.   buPROPion (WELLBUTRIN XL) 150 MG 24 hr tablet TAKE 1 TABLET BY MOUTH EVERY DAY IN THE MORNING   busPIRone (BUSPAR) 5 MG tablet Take 1 tablet (5 mg total) by mouth 3 (three) times daily.   desvenlafaxine (PRISTIQ) 100 MG 24 hr tablet Take 1 tablet (100 mg total) by mouth daily.   Diclofenac Sodium 1 % CREA Apply 1 application topically in the morning and at bedtime.   Fluticasone-Umeclidin-Vilant (TRELEGY ELLIPTA) 100-62.5-25 MCG/ACT AEPB Inhale 1 puff into the lungs daily.   gabapentin (NEURONTIN) 600 MG tablet TAKE 1 TABLET BY MOUTH THREE TIMES A DAY   hydroxychloroquine (PLAQUENIL) 200 MG tablet Take one tablet by mouth daily.   levothyroxine (SYNTHROID) 150 MCG tablet 1 TAB (150 MCG) BY MOUTH EVERY SUNDAY, MONDAY, WEDNESDAY, Friday, SATURDAY. 2 TABS (300 MCG) BY MOUTH EVERY TUESDAY, THURSDAY.   metFORMIN (GLUCOPHAGE) 1000 MG tablet Take 1 tablet (1,000 mg total) by mouth 2 (two) times daily with a meal.   traZODone (DESYREL) 50 MG tablet Take 0.5-2 tablets (25-100 mg total) by mouth at bedtime as needed for sleep.   No facility-administered encounter medications on file as of 04/29/2021.  Patient Active Problem List   Diagnosis Date Noted   Hyponatremia 03/26/2021   Leukocytosis 03/06/2021   Acute respiratory failure with hypoxia and hypercapnia (HCC) 03/06/2021   Elevated blood pressure reading in office with diagnosis of hypertension 01/13/2021   Intertrigo 01/05/2021   Medication non-compliance due to excessive pill burden 11/28/2020   Confusion 11/25/2020   Declined smoking cessation 08/05/2020   Polypharmacy 08/04/2020   Chronic  upper neck pain 12/18/2019   Left lumbar radiculopathy 11/05/2019   Arthralgia of lower leg 10/22/2019   Bilateral hand swelling 07/16/2019   Bilateral hand pain 07/16/2019   SIRS (systemic inflammatory response syndrome) (HCC) 06/04/2019   Sepsis ([place]) 06/04/2019   Excessive sleepiness 05/15/2019   No appetite 05/15/2019   Fatigue 12/11/2018   Fixed pupil of both eyes 07/18/2018   Frequent headaches 07/18/2018   Unsteady gait 07/18/2018   Primary osteoarthritis 07/14/2018   Gastroesophageal reflux disease with esophagitis 07/14/2018   Vitamin D deficiency 04/05/2018   Primary insomnia 04/05/2018   Hiatal hernia 04/05/2018   Vasovagal syncope 12/16/2017   History of diverticulitis 11/21/2017   Non-intractable vomiting with nausea 11/21/2017   Left lower quadrant guarding 11/21/2017   Generalized abdominal pain 11/21/2017   Positive colorectal cancer screening using Cologuard test 10/07/2017   Microalbuminuria 10/07/2017   Weakness of both lower extremities 10/07/2017   Bilateral cataracts 09/07/2017   Dry eye syndrome of bilateral lacrimal glands 09/07/2017   Moderate episode of recurrent major depressive disorder ([place]) 07/24/2017   Bilateral calf pain 07/24/2017   Mild cognitive impairment 07/24/2017   Abnormal finding on GI tract imaging 05/25/2017   Lumbar degenerative disc disease 05/13/2017   Common bile duct dilatation 04/27/2017   Left upper arm swelling secondary to rotator cuff tear and fluid tracking down biceps tendon sheath 04/14/2017   Anxiety 03/12/2017   Rotator cuff tendonitis, left 03/10/2017   Myocardial infarct ([place]) 03/09/2017   CAD (coronary artery disease) 03/09/2017   RA (rheumatoid arthritis) ([place]) 07/06/2016   Atherosclerosis of native coronary artery of native heart without angina pectoris 03/16/2016   Chronic back pain 02/27/2016   Acquired hypothyroidism 12/01/2015   COPD (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 ([place])  03/05/2015   Hyperlipidemia 01/22/2015   COLD (chronic obstructive lung disease) ([place]) 12/06/2013   Acute pain of left shoulder 09/23/2013   Ganglion cyst of wrist 07/13/2013   Anxiety state 05/16/2013   Trochanteric bursitis of right hip 05/04/2013   Diabetes type 2, controlled ([place]) 03/14/2013   Hypothyroidism 03/14/2013   Sacroiliitis ([place]) 09/14/2011   Chronic headache 04/03/2011   Tobacco use 04/03/2011   Chronic pain 04/03/2011   Benign hypertension 04/03/2011   Allergic rhinitis 10/06/2010   HYPERCALCEMIA 11/13/2009   ARTHRITIS, ACROMIOCLAVICULAR 07/31/2008   SPINAL STENOSIS IN CERVICAL REGION 07/31/2008   Osteoporosis 10/17/2007   Mixed hyperlipidemia 09/28/2007   MDD (major depressive disorder), recurrent episode, mild ([place]) 09/28/2007   FATTY LIVER DISEASE 09/28/2007   Elevated liver function tests 09/28/2007   Chronic pain syndrome 06/05/2007    Conditions to be addressed/monitored:HTN and DMII  Care Plan : Diabetes Type 2 (Adult)  Updates made by Luretha Rued, RN since 04/29/2021 12:00 AM  Completed 04/29/2021   Problem: Developement of long term care plan for management of diabetes in a patient with cardiovascular disease Resolved 04/29/2021  Priority: Medium     Long-Range Goal: Disease Progression Prevented or Minimized Completed 04/29/2021  Start Date: 11/26/2020  Expected End Date: 05/08/2021  Priority: Medium  Note:   Patient declines further participation-goals resolved. Objective:  Lab Results  Component Value Date   HGBA1C 7.3 (H) 02/20/2021   Lab Results  Component Value Date   CREATININE 0.83 02/20/2021   CREATININE 0.95 (H) 11/28/2020   CREATININE 0.68 08/19/2020  Objective:  Last practice recorded BP readings:  BP Readings from Last 3 Encounters:  02/25/21 (!) 159/92  02/19/21 (!) 149/81  01/07/21 (!) 172/72   Most recent eGFR/CrCl:  Lab Results  Component Value Date   EGFR 75 02/20/2021  Current Barriers:  Knowledge Deficits  related to long term care plan for management of diabetes in a patient with HTN, CAD, history of MI, mild cognitive impairment. 70 yr old lives with husband. Patient denies dizziness at this time. She reports she has not had a fall in the past month. She denies any issues with confusion at this time. A1C 7.3 increased from 7.0 on 08/01/20. Patient reports she does not feel she needs Desvenlafaxine because she is not depressed, but will discuss with primary care provider. RNCM encouraged patient to speak with provider and reinforced that the medication is probably helping to relieve signs/symptoms. Patient reports concern about discontinuing Alprazolam. RNCM offered referral to LCSW regarding anxiety. Patient declined.   Does not have glucometer  Does not adhere to prescribed medication regimen Case Manager Clinical Goal(s):  patient will demonstrate improved adherence to prescribed treatment plan for diabetes self care/management as evidenced by: increased monitoring of blood sugars per provider recommendation and adherence to prescribed medication regimen Interventions:  Collaboration with Donella Stade, PA-C regarding development and update of comprehensive plan of care as evidenced by provider attestation and co-signature Inter-disciplinary care team collaboration (see longitudinal plan of care) Reviewed medications with patient. Reinforced clinical pharmacist will call to review/discuss medications.  Coordination with provider regarding glucose meter.  Reviewed scheduled/upcoming provider appointments: per after visit summary, patient to be seen around 04/27/21. RNCM informed patient. Ms. Berish states she will call to schedule. Discussed plans with patient for ongoing care management follow up and provided patient with direct contact information for care management team Patient Goals: - attend provider visits as scheduled - obtain glucose meter and begin to check your blood sugar per provider  recommendation - check blood sugar per provider recommendation and as needed, if I feel it is too high or too low - continue fall prevention strategies: change position slowly; keep walkways clear, have good lighting in the room, keep provider appointments, take medications as prescribed, maintain good muscle strength/tone. - call your provider if your condition does not improve or worsens and with questions or concerns - communicate with clinical pharmacist regarding medications.     Plan:No further follow up required: RNCM will updated clinical pharmacist.  Thea Silversmith, RN, MSN, BSN, CCM Care Management Coordinator [facility] [phone]

## 2021-04-29 NOTE — Patient Instructions (Signed)
Visit Information ° °Thank you for allowing me to share the care management and care coordination services that are available to you as part of your health plan and services through your primary care provider and medical home. Please reach out to me at [phone] if the care management/care coordination team may be of assistance to you in the future.  ° °Avina Eberle, RN, MSN, BSN, CCM °Care Management Coordinator °[facility] °[phone]  °

## 2021-05-16 ENCOUNTER — Other Ambulatory Visit: Payer: Self-pay | Admitting: Physician Assistant

## 2021-05-16 DIAGNOSIS — F419 Anxiety disorder, unspecified: Secondary | ICD-10-CM

## 2021-05-18 ENCOUNTER — Other Ambulatory Visit: Payer: Self-pay | Admitting: Physician Assistant

## 2021-05-18 DIAGNOSIS — F419 Anxiety disorder, unspecified: Secondary | ICD-10-CM

## 2021-05-18 NOTE — Telephone Encounter (Signed)
Xanax denied, no longer on med list. Received multiple requests and patient has called and left a vm. Please advise.

## 2021-05-19 MED ORDER — ALPRAZOLAM 0.25 MG PO TABS: ORAL_TABLET | ORAL | 0 refills | Status: DC

## 2021-05-19 NOTE — Telephone Encounter (Signed)
We are on a taper down to once a day. 11/30 she got 30 tablets filled that should last until 12/30. We can keep her on that amt. 30 tablets a month and hold. It should still be on med list.

## 2021-06-13 DIAGNOSIS — Z7984 Long term (current) use of oral hypoglycemic drugs: Secondary | ICD-10-CM | POA: Diagnosis not present

## 2021-06-13 DIAGNOSIS — W19XXXA Unspecified fall, initial encounter: Secondary | ICD-10-CM | POA: Diagnosis not present

## 2021-06-13 DIAGNOSIS — J449 Chronic obstructive pulmonary disease, unspecified: Secondary | ICD-10-CM | POA: Diagnosis not present

## 2021-06-13 DIAGNOSIS — G8911 Acute pain due to trauma: Secondary | ICD-10-CM | POA: Diagnosis not present

## 2021-06-13 DIAGNOSIS — E119 Type 2 diabetes mellitus without complications: Secondary | ICD-10-CM | POA: Diagnosis not present

## 2021-06-13 DIAGNOSIS — R69 Illness, unspecified: Secondary | ICD-10-CM | POA: Diagnosis not present

## 2021-06-13 DIAGNOSIS — E079 Disorder of thyroid, unspecified: Secondary | ICD-10-CM | POA: Diagnosis not present

## 2021-06-13 DIAGNOSIS — M19012 Primary osteoarthritis, left shoulder: Secondary | ICD-10-CM | POA: Diagnosis not present

## 2021-06-13 DIAGNOSIS — S46912A Strain of unspecified muscle, fascia and tendon at shoulder and upper arm level, left arm, initial encounter: Secondary | ICD-10-CM | POA: Diagnosis not present

## 2021-06-13 DIAGNOSIS — M1990 Unspecified osteoarthritis, unspecified site: Secondary | ICD-10-CM | POA: Diagnosis not present

## 2021-06-13 DIAGNOSIS — I10 Essential (primary) hypertension: Secondary | ICD-10-CM | POA: Diagnosis not present

## 2021-06-13 DIAGNOSIS — M25512 Pain in left shoulder: Secondary | ICD-10-CM | POA: Diagnosis not present

## 2021-06-15 ENCOUNTER — Telehealth: Payer: Self-pay | Admitting: Neurology

## 2021-06-15 NOTE — Telephone Encounter (Signed)
Patient left a vm stating she knows she is weaning off Xanax, but feels like 0.25 mg not working. She is asking to go back to 0.5 mg dosage. Please advise.

## 2021-06-17 ENCOUNTER — Other Ambulatory Visit: Payer: Self-pay | Admitting: Physician Assistant

## 2021-06-17 NOTE — Telephone Encounter (Signed)
Last written 05/26/2021 #30 with no refills

## 2021-06-17 NOTE — Telephone Encounter (Signed)
Sent .5mg  bid

## 2021-06-18 NOTE — Telephone Encounter (Signed)
Patient made aware via voicemail.

## 2021-06-19 ENCOUNTER — Ambulatory Visit: Payer: Medicare HMO | Admitting: Sports Medicine

## 2021-07-23 ENCOUNTER — Telehealth: Payer: Self-pay | Admitting: Neurology

## 2021-07-23 NOTE — Telephone Encounter (Signed)
Patient left vm stating she has lost her Xanax and wants to know if we can authorize an early refill. Looks like she had a refill available to fill 07/18/2021. Anette Barra - Can you look in controlled medication database on patient?

## 2021-07-24 NOTE — Telephone Encounter (Signed)
Called patient and LVM letting her know no early refill.

## 2021-07-24 NOTE — Telephone Encounter (Signed)
It was just picked up on 2/8 for 60. So no early refill.

## 2021-07-28 ENCOUNTER — Encounter: Payer: Medicare HMO | Admitting: Physician Assistant

## 2021-07-28 NOTE — Progress Notes (Signed)
Erroneous encounter. Called to cancel and was not canceled.

## 2021-07-30 ENCOUNTER — Other Ambulatory Visit: Payer: Self-pay | Admitting: Physician Assistant

## 2021-07-30 DIAGNOSIS — M5136 Other intervertebral disc degeneration, lumbar region: Secondary | ICD-10-CM

## 2021-07-31 ENCOUNTER — Other Ambulatory Visit: Payer: Self-pay | Admitting: Neurology

## 2021-07-31 MED ORDER — PREDNISONE 5 MG PO TABS: ORAL_TABLET | ORAL | 1 refills | Status: AC

## 2021-07-31 NOTE — Telephone Encounter (Signed)
Patient called and is requesting a refill of Prednisone. She has been taking 3 tablets daily instead of 1 as prescribed. It is not on current medication list, but looks like it was last prescribed in October. Prednisone 5 mg - 1 tablet daily - #90 with 1 refill. Please advise.  ?

## 2021-08-03 ENCOUNTER — Telehealth: Payer: Self-pay | Admitting: Neurology

## 2021-08-03 NOTE — Telephone Encounter (Signed)
Patient left vm asking for medication to help her arthritis pain.  ?Called patient back with no answer, left VM letting her know Prednisone was sent 3 days ago to help with arthritis pain, to call back with any questions.  ?

## 2021-08-11 ENCOUNTER — Ambulatory Visit: Payer: Medicare HMO | Admitting: Physician Assistant

## 2021-08-31 ENCOUNTER — Other Ambulatory Visit: Payer: Self-pay | Admitting: Neurology

## 2021-08-31 MED ORDER — ALPRAZOLAM 0.5 MG PO TABS: 0.5000 mg | ORAL_TABLET | Freq: Two times a day (BID) | ORAL | 0 refills | Status: AC | PRN

## 2021-08-31 NOTE — Telephone Encounter (Signed)
..  PDMP reviewed during this encounter. ?Agree with above plan.

## 2021-08-31 NOTE — Telephone Encounter (Signed)
Xanax refill requested, last written 06/17/2021 #60 with 2 refills for [facility] in [place].  ? ?Patient moved to [place] and ref[place] request coming from [place]. Please advise. Pended with note that this is last refill and needs to est care with new provider. Sign if appropriate.  ?

## 2021-10-30 ENCOUNTER — Other Ambulatory Visit: Payer: Self-pay | Admitting: Physician Assistant

## 2021-10-30 DIAGNOSIS — E039 Hypothyroidism, unspecified: Secondary | ICD-10-CM

## 2021-12-31 ENCOUNTER — Other Ambulatory Visit: Payer: Self-pay | Admitting: Physician Assistant

## 2022-02-10 ENCOUNTER — Other Ambulatory Visit: Payer: Self-pay | Admitting: Physician Assistant

## 2022-02-10 DIAGNOSIS — E039 Hypothyroidism, unspecified: Secondary | ICD-10-CM

## 2022-02-27 ENCOUNTER — Other Ambulatory Visit: Payer: Self-pay | Admitting: Physician Assistant

## 2022-02-27 DIAGNOSIS — E039 Hypothyroidism, unspecified: Secondary | ICD-10-CM

## 2022-04-13 ENCOUNTER — Other Ambulatory Visit: Payer: Self-pay | Admitting: Physician Assistant

## 2022-05-10 ENCOUNTER — Other Ambulatory Visit: Payer: Self-pay | Admitting: Physician Assistant

## 2022-05-10 DIAGNOSIS — E039 Hypothyroidism, unspecified: Secondary | ICD-10-CM

## 2022-07-14 ENCOUNTER — Other Ambulatory Visit: Payer: Self-pay | Admitting: Physician Assistant

## 2022-08-30 ENCOUNTER — Other Ambulatory Visit: Payer: Self-pay | Admitting: Physician Assistant

## 2022-08-30 DIAGNOSIS — E039 Hypothyroidism, unspecified: Secondary | ICD-10-CM

## 2024-01-31 ENCOUNTER — Encounter: Payer: Self-pay | Admitting: Sports Medicine
